# Patient Record
Sex: Female | Born: 1945
Health system: Southern US, Community
[De-identification: ages and names within clinical notes are randomized; demographics above are authoritative.]

## PROBLEM LIST (undated history)

## (undated) DIAGNOSIS — I5032 Chronic diastolic (congestive) heart failure: Secondary | ICD-10-CM

## (undated) DIAGNOSIS — I480 Paroxysmal atrial fibrillation: Secondary | ICD-10-CM

## (undated) DIAGNOSIS — E739 Lactose intolerance, unspecified: Secondary | ICD-10-CM

## (undated) DIAGNOSIS — R51 Headache: Secondary | ICD-10-CM

## (undated) DIAGNOSIS — N289 Disorder of kidney and ureter, unspecified: Secondary | ICD-10-CM

## (undated) DIAGNOSIS — M1990 Unspecified osteoarthritis, unspecified site: Secondary | ICD-10-CM

## (undated) DIAGNOSIS — I10 Essential (primary) hypertension: Secondary | ICD-10-CM

## (undated) DIAGNOSIS — L719 Rosacea, unspecified: Secondary | ICD-10-CM

## (undated) DIAGNOSIS — I2510 Atherosclerotic heart disease of native coronary artery without angina pectoris: Secondary | ICD-10-CM

## (undated) DIAGNOSIS — Z9289 Personal history of other medical treatment: Secondary | ICD-10-CM

## (undated) DIAGNOSIS — K219 Gastro-esophageal reflux disease without esophagitis: Secondary | ICD-10-CM

## (undated) DIAGNOSIS — E785 Hyperlipidemia, unspecified: Secondary | ICD-10-CM

## (undated) HISTORY — PX: CHOLECYSTECTOMY: SHX55

## (undated) HISTORY — DX: Atherosclerotic heart disease of native coronary artery without angina pectoris: I25.10

## (undated) HISTORY — DX: Paroxysmal atrial fibrillation: I48.0

## (undated) HISTORY — PX: ABDOMINAL HYSTERECTOMY: SHX81

## (undated) HISTORY — PX: TOTAL KNEE ARTHROPLASTY: SHX125

## (undated) HISTORY — DX: Lactose intolerance, unspecified: E73.9

## (undated) HISTORY — DX: Essential (primary) hypertension: I10

## (undated) HISTORY — DX: Chronic diastolic (congestive) heart failure: I50.32

## (undated) HISTORY — DX: Morbid (severe) obesity due to excess calories: E66.01

## (undated) HISTORY — PX: TUBAL LIGATION: SHX77

## (undated) HISTORY — DX: Gastro-esophageal reflux disease without esophagitis: K21.9

## (undated) HISTORY — PX: KNEE ARTHROSCOPY: SUR90

## (undated) HISTORY — DX: Hyperlipidemia, unspecified: E78.5

## (undated) HISTORY — PX: CORONARY STENT PLACEMENT: SHX1402

## (undated) HISTORY — DX: Headache: R51

## (undated) HISTORY — DX: Rosacea, unspecified: L71.9

---

## 1998-06-29 ENCOUNTER — Encounter: Payer: Self-pay | Admitting: Obstetrics and Gynecology

## 1998-06-29 ENCOUNTER — Ambulatory Visit (HOSPITAL_COMMUNITY): Admission: RE | Admit: 1998-06-29 | Discharge: 1998-06-29 | Payer: Self-pay | Admitting: *Deleted

## 1999-11-21 ENCOUNTER — Encounter: Payer: Self-pay | Admitting: Obstetrics and Gynecology

## 1999-11-21 ENCOUNTER — Ambulatory Visit (HOSPITAL_COMMUNITY): Admission: RE | Admit: 1999-11-21 | Discharge: 1999-11-21 | Payer: Self-pay | Admitting: Obstetrics and Gynecology

## 1999-11-27 ENCOUNTER — Encounter: Admission: RE | Admit: 1999-11-27 | Discharge: 1999-11-27 | Payer: Self-pay | Admitting: Obstetrics and Gynecology

## 1999-11-27 ENCOUNTER — Encounter: Payer: Self-pay | Admitting: Obstetrics and Gynecology

## 2001-02-05 HISTORY — PX: ESOPHAGOGASTRODUODENOSCOPY: SHX1529

## 2001-05-07 ENCOUNTER — Ambulatory Visit (HOSPITAL_COMMUNITY): Admission: RE | Admit: 2001-05-07 | Discharge: 2001-05-07 | Payer: Self-pay | Admitting: Obstetrics and Gynecology

## 2001-05-07 ENCOUNTER — Encounter: Payer: Self-pay | Admitting: Obstetrics and Gynecology

## 2001-06-25 ENCOUNTER — Ambulatory Visit (HOSPITAL_COMMUNITY): Admission: RE | Admit: 2001-06-25 | Discharge: 2001-06-25 | Payer: Self-pay | Admitting: Gastroenterology

## 2001-07-28 ENCOUNTER — Encounter: Payer: Self-pay | Admitting: Surgery

## 2001-07-31 ENCOUNTER — Encounter: Payer: Self-pay | Admitting: Surgery

## 2001-07-31 ENCOUNTER — Encounter (INDEPENDENT_AMBULATORY_CARE_PROVIDER_SITE_OTHER): Payer: Self-pay | Admitting: Specialist

## 2001-07-31 ENCOUNTER — Observation Stay (HOSPITAL_COMMUNITY): Admission: RE | Admit: 2001-07-31 | Discharge: 2001-08-01 | Payer: Self-pay | Admitting: Surgery

## 2002-01-19 ENCOUNTER — Emergency Department (HOSPITAL_COMMUNITY): Admission: EM | Admit: 2002-01-19 | Discharge: 2002-01-19 | Payer: Self-pay | Admitting: Emergency Medicine

## 2002-06-30 ENCOUNTER — Ambulatory Visit (HOSPITAL_COMMUNITY): Admission: RE | Admit: 2002-06-30 | Discharge: 2002-06-30 | Payer: Self-pay | Admitting: Orthopedic Surgery

## 2002-09-07 ENCOUNTER — Ambulatory Visit (HOSPITAL_COMMUNITY): Admission: RE | Admit: 2002-09-07 | Discharge: 2002-09-07 | Payer: Self-pay | Admitting: Obstetrics and Gynecology

## 2002-09-07 ENCOUNTER — Encounter: Payer: Self-pay | Admitting: Obstetrics and Gynecology

## 2003-09-09 ENCOUNTER — Ambulatory Visit (HOSPITAL_COMMUNITY): Admission: RE | Admit: 2003-09-09 | Discharge: 2003-09-09 | Payer: Self-pay | Admitting: Family Medicine

## 2003-09-27 ENCOUNTER — Other Ambulatory Visit: Admission: RE | Admit: 2003-09-27 | Discharge: 2003-09-27 | Payer: Self-pay | Admitting: Internal Medicine

## 2004-07-14 ENCOUNTER — Ambulatory Visit: Payer: Self-pay | Admitting: Internal Medicine

## 2004-08-01 ENCOUNTER — Ambulatory Visit: Payer: Self-pay | Admitting: Family Medicine

## 2004-08-15 ENCOUNTER — Ambulatory Visit: Payer: Self-pay | Admitting: Family Medicine

## 2004-09-12 ENCOUNTER — Ambulatory Visit: Payer: Self-pay | Admitting: Cardiology

## 2004-09-19 ENCOUNTER — Ambulatory Visit: Payer: Self-pay | Admitting: Cardiology

## 2004-09-21 ENCOUNTER — Ambulatory Visit: Payer: Self-pay | Admitting: Cardiology

## 2004-09-25 ENCOUNTER — Ambulatory Visit: Payer: Self-pay | Admitting: Cardiology

## 2004-09-25 ENCOUNTER — Inpatient Hospital Stay (HOSPITAL_BASED_OUTPATIENT_CLINIC_OR_DEPARTMENT_OTHER): Admission: RE | Admit: 2004-09-25 | Discharge: 2004-09-25 | Payer: Self-pay | Admitting: Cardiology

## 2004-10-06 ENCOUNTER — Ambulatory Visit: Payer: Self-pay

## 2004-10-10 ENCOUNTER — Ambulatory Visit: Payer: Self-pay | Admitting: Cardiology

## 2004-11-29 ENCOUNTER — Ambulatory Visit: Payer: Self-pay | Admitting: Family Medicine

## 2005-03-30 ENCOUNTER — Ambulatory Visit: Payer: Self-pay | Admitting: Family Medicine

## 2005-04-19 ENCOUNTER — Inpatient Hospital Stay (HOSPITAL_COMMUNITY): Admission: RE | Admit: 2005-04-19 | Discharge: 2005-04-22 | Payer: Self-pay | Admitting: Orthopedic Surgery

## 2005-05-08 ENCOUNTER — Encounter: Admission: RE | Admit: 2005-05-08 | Discharge: 2005-08-06 | Payer: Self-pay | Admitting: Orthopedic Surgery

## 2005-10-19 ENCOUNTER — Encounter: Payer: Self-pay | Admitting: Family Medicine

## 2005-10-19 ENCOUNTER — Ambulatory Visit: Payer: Self-pay | Admitting: Family Medicine

## 2005-10-19 ENCOUNTER — Other Ambulatory Visit: Admission: RE | Admit: 2005-10-19 | Discharge: 2005-10-19 | Payer: Self-pay | Admitting: Family Medicine

## 2005-10-19 LAB — CONVERTED CEMR LAB: Pap Smear: NORMAL

## 2005-10-31 ENCOUNTER — Ambulatory Visit (HOSPITAL_COMMUNITY): Admission: RE | Admit: 2005-10-31 | Discharge: 2005-10-31 | Payer: Self-pay | Admitting: Family Medicine

## 2005-11-15 ENCOUNTER — Ambulatory Visit (HOSPITAL_BASED_OUTPATIENT_CLINIC_OR_DEPARTMENT_OTHER): Admission: RE | Admit: 2005-11-15 | Discharge: 2005-11-15 | Payer: Self-pay | Admitting: Family Medicine

## 2005-11-19 ENCOUNTER — Ambulatory Visit: Payer: Self-pay | Admitting: Family Medicine

## 2005-11-28 ENCOUNTER — Ambulatory Visit: Payer: Self-pay | Admitting: Pulmonary Disease

## 2005-11-30 LAB — FECAL OCCULT BLOOD, GUAIAC: Fecal Occult Blood: NEGATIVE

## 2005-12-06 ENCOUNTER — Ambulatory Visit: Payer: Self-pay | Admitting: Family Medicine

## 2005-12-21 ENCOUNTER — Ambulatory Visit: Payer: Self-pay | Admitting: Pulmonary Disease

## 2006-05-31 ENCOUNTER — Ambulatory Visit: Payer: Self-pay | Admitting: Family Medicine

## 2006-07-04 ENCOUNTER — Inpatient Hospital Stay (HOSPITAL_COMMUNITY): Admission: RE | Admit: 2006-07-04 | Discharge: 2006-07-09 | Payer: Self-pay | Admitting: Orthopedic Surgery

## 2006-07-04 ENCOUNTER — Ambulatory Visit: Payer: Self-pay | Admitting: Cardiology

## 2006-07-04 ENCOUNTER — Ambulatory Visit: Payer: Self-pay | Admitting: Internal Medicine

## 2006-07-10 ENCOUNTER — Telehealth: Payer: Self-pay | Admitting: Family Medicine

## 2006-07-10 DIAGNOSIS — I4819 Other persistent atrial fibrillation: Secondary | ICD-10-CM

## 2006-07-17 ENCOUNTER — Telehealth (INDEPENDENT_AMBULATORY_CARE_PROVIDER_SITE_OTHER): Payer: Self-pay | Admitting: *Deleted

## 2006-07-30 ENCOUNTER — Encounter: Admission: RE | Admit: 2006-07-30 | Discharge: 2006-08-16 | Payer: Self-pay | Admitting: Orthopedic Surgery

## 2006-07-31 ENCOUNTER — Ambulatory Visit: Payer: Self-pay | Admitting: Cardiology

## 2006-08-01 ENCOUNTER — Ambulatory Visit: Payer: Self-pay | Admitting: Family Medicine

## 2006-08-01 DIAGNOSIS — E7800 Pure hypercholesterolemia, unspecified: Secondary | ICD-10-CM | POA: Insufficient documentation

## 2006-08-05 LAB — CONVERTED CEMR LAB
Cholesterol: 108 mg/dL (ref 0–200)
HDL: 40.7 mg/dL (ref >39.0)
Total CHOL/HDL Ratio: 2.7
Triglycerides: 118 mg/dL (ref 0–149)
VLDL: 24 mg/dL (ref 0–40)

## 2006-08-06 ENCOUNTER — Telehealth (INDEPENDENT_AMBULATORY_CARE_PROVIDER_SITE_OTHER): Payer: Self-pay | Admitting: *Deleted

## 2006-08-06 ENCOUNTER — Encounter: Payer: Self-pay | Admitting: Family Medicine

## 2006-08-06 DIAGNOSIS — G4733 Obstructive sleep apnea (adult) (pediatric): Secondary | ICD-10-CM | POA: Insufficient documentation

## 2006-08-06 DIAGNOSIS — R51 Headache: Secondary | ICD-10-CM

## 2006-08-06 DIAGNOSIS — R519 Headache, unspecified: Secondary | ICD-10-CM | POA: Insufficient documentation

## 2006-08-06 DIAGNOSIS — L719 Rosacea, unspecified: Secondary | ICD-10-CM

## 2006-08-06 DIAGNOSIS — E739 Lactose intolerance, unspecified: Secondary | ICD-10-CM

## 2006-08-06 DIAGNOSIS — R609 Edema, unspecified: Secondary | ICD-10-CM

## 2006-08-06 DIAGNOSIS — I10 Essential (primary) hypertension: Secondary | ICD-10-CM | POA: Insufficient documentation

## 2006-08-06 DIAGNOSIS — E785 Hyperlipidemia, unspecified: Secondary | ICD-10-CM

## 2006-08-06 DIAGNOSIS — N318 Other neuromuscular dysfunction of bladder: Secondary | ICD-10-CM

## 2006-08-06 DIAGNOSIS — N3946 Mixed incontinence: Secondary | ICD-10-CM

## 2006-08-06 DIAGNOSIS — M1990 Unspecified osteoarthritis, unspecified site: Secondary | ICD-10-CM | POA: Insufficient documentation

## 2006-08-06 DIAGNOSIS — H9319 Tinnitus, unspecified ear: Secondary | ICD-10-CM | POA: Insufficient documentation

## 2006-08-16 ENCOUNTER — Ambulatory Visit: Payer: Self-pay | Admitting: Family Medicine

## 2006-08-16 DIAGNOSIS — N390 Urinary tract infection, site not specified: Secondary | ICD-10-CM

## 2006-08-16 DIAGNOSIS — R197 Diarrhea, unspecified: Secondary | ICD-10-CM

## 2006-08-16 DIAGNOSIS — R110 Nausea: Secondary | ICD-10-CM

## 2006-08-16 LAB — CONVERTED CEMR LAB
Ketones, urine, test strip: NEGATIVE
Nitrite: NEGATIVE
Protein, U semiquant: 30
Specific Gravity, Urine: <1.005
Urine crystals, microscopic: 0 /hpf
Urobilinogen, UA: 0.2
Yeast, UA: 0
pH: 8

## 2006-08-17 ENCOUNTER — Encounter: Payer: Self-pay | Admitting: Family Medicine

## 2006-08-20 ENCOUNTER — Telehealth: Payer: Self-pay | Admitting: Family Medicine

## 2006-08-23 ENCOUNTER — Encounter: Payer: Self-pay | Admitting: Family Medicine

## 2006-08-26 ENCOUNTER — Ambulatory Visit: Payer: Self-pay | Admitting: Family Medicine

## 2006-08-26 DIAGNOSIS — R109 Unspecified abdominal pain: Secondary | ICD-10-CM | POA: Insufficient documentation

## 2006-08-26 DIAGNOSIS — N3000 Acute cystitis without hematuria: Secondary | ICD-10-CM

## 2006-08-27 LAB — CONVERTED CEMR LAB
AST: 16 units/L (ref 0–37)
Albumin: 3.8 g/dL (ref 3.5–5.2)
Basophils Relative: 0.8 % (ref 0.0–1.0)
Eosinophils Absolute: 0.3 10*3/uL (ref 0.0–0.6)
Eosinophils Relative: 3.2 % (ref 0.0–5.0)
H Pylori IgG: NEGATIVE
Hemoglobin: 13 g/dL (ref 12.0–15.0)
Monocytes Relative: 4.9 % (ref 3.0–11.0)
Neutro Abs: 5.4 10*3/uL (ref 1.4–7.7)
Neutrophils Relative %: 68.5 % (ref 43.0–77.0)
Platelets: 253 10*3/uL (ref 150–400)
RDW: 14 % (ref 11.5–14.6)
Total Bilirubin: 0.6 mg/dL (ref 0.3–1.2)

## 2006-08-30 ENCOUNTER — Ambulatory Visit: Payer: Self-pay | Admitting: Family Medicine

## 2006-08-30 DIAGNOSIS — K219 Gastro-esophageal reflux disease without esophagitis: Secondary | ICD-10-CM

## 2006-12-25 ENCOUNTER — Ambulatory Visit: Payer: Self-pay | Admitting: Family Medicine

## 2007-01-22 ENCOUNTER — Ambulatory Visit (HOSPITAL_COMMUNITY): Admission: RE | Admit: 2007-01-22 | Discharge: 2007-01-22 | Payer: Self-pay | Admitting: Family Medicine

## 2007-01-28 ENCOUNTER — Encounter (INDEPENDENT_AMBULATORY_CARE_PROVIDER_SITE_OTHER): Payer: Self-pay | Admitting: *Deleted

## 2007-01-28 LAB — HM MAMMOGRAPHY: HM Mammogram: NORMAL

## 2007-06-05 ENCOUNTER — Ambulatory Visit: Payer: Self-pay | Admitting: Cardiology

## 2007-06-05 LAB — CONVERTED CEMR LAB
AST: 18 units/L (ref 0–37)
Albumin: 3.6 g/dL (ref 3.5–5.2)
Basophils Absolute: 0.1 10*3/uL (ref 0.0–0.1)
Basophils Relative: 0.6 % (ref 0.0–1.0)
Chloride: 103 meq/L (ref 96–112)
Eosinophils Absolute: 0.3 10*3/uL (ref 0.0–0.7)
Eosinophils Relative: 3.2 % (ref 0.0–5.0)
Hemoglobin: 13.6 g/dL (ref 12.0–15.0)
MCHC: 33.6 g/dL (ref 30.0–36.0)
MCV: 80.9 fL (ref 78.0–100.0)
Magnesium: 1.9 mg/dL (ref 1.5–2.5)
Monocytes Absolute: 0.4 10*3/uL (ref 0.1–1.0)
Neutro Abs: 5.9 10*3/uL (ref 1.4–7.7)
Neutrophils Relative %: 68.2 % (ref 43.0–77.0)
Platelets: 253 10*3/uL (ref 150–400)
Potassium: 3.4 meq/L — ABNORMAL LOW (ref 3.5–5.1)
RBC: 5 M/uL (ref 3.87–5.11)
RDW: 13.3 % (ref 11.5–14.6)
Sodium: 139 meq/L (ref 135–145)
TSH: 2.44 microintl units/mL (ref 0.35–5.50)
Total Bilirubin: 0.7 mg/dL (ref 0.3–1.2)
Total Protein: 6.5 g/dL (ref 6.0–8.3)

## 2007-06-11 ENCOUNTER — Ambulatory Visit: Payer: Self-pay | Admitting: Cardiology

## 2007-06-16 ENCOUNTER — Ambulatory Visit: Payer: Self-pay | Admitting: Cardiology

## 2007-06-19 ENCOUNTER — Ambulatory Visit: Payer: Self-pay | Admitting: Cardiology

## 2007-06-19 LAB — CONVERTED CEMR LAB
CO2: 25 meq/L (ref 19–32)
Chloride: 108 meq/L (ref 96–112)
GFR calc Af Amer: 72 mL/min
GFR calc non Af Amer: 60 mL/min
Glucose, Bld: 93 mg/dL (ref 70–99)
Potassium: 4 meq/L (ref 3.5–5.1)

## 2007-06-25 ENCOUNTER — Ambulatory Visit: Payer: Self-pay | Admitting: Cardiology

## 2007-07-04 ENCOUNTER — Ambulatory Visit: Payer: Self-pay | Admitting: Cardiology

## 2007-07-24 ENCOUNTER — Ambulatory Visit: Payer: Self-pay | Admitting: Cardiology

## 2007-08-21 ENCOUNTER — Ambulatory Visit: Payer: Self-pay | Admitting: Cardiology

## 2007-08-28 ENCOUNTER — Ambulatory Visit: Payer: Self-pay | Admitting: Cardiology

## 2007-09-04 ENCOUNTER — Ambulatory Visit: Payer: Self-pay | Admitting: Internal Medicine

## 2007-09-12 ENCOUNTER — Ambulatory Visit: Payer: Self-pay | Admitting: Cardiology

## 2007-09-12 LAB — CONVERTED CEMR LAB
Basophils Absolute: 0.1 10*3/uL (ref 0.0–0.1)
Basophils Relative: 0.7 % (ref 0.0–3.0)
Creatinine, Ser: 0.9 mg/dL (ref 0.4–1.2)
Eosinophils Relative: 2.8 % (ref 0.0–5.0)
GFR calc non Af Amer: 67 mL/min
Lymphocytes Relative: 24.2 % (ref 12.0–46.0)
MCHC: 34.1 g/dL (ref 30.0–36.0)
MCV: 79.7 fL (ref 78.0–100.0)
Monocytes Absolute: 0.5 10*3/uL (ref 0.1–1.0)
Monocytes Relative: 6.2 % (ref 3.0–12.0)
Platelets: 249 10*3/uL (ref 150–400)
RBC: 5.09 M/uL (ref 3.87–5.11)

## 2007-09-16 ENCOUNTER — Ambulatory Visit (HOSPITAL_COMMUNITY): Admission: RE | Admit: 2007-09-16 | Discharge: 2007-09-16 | Payer: Self-pay | Admitting: Cardiology

## 2007-09-16 ENCOUNTER — Encounter: Payer: Self-pay | Admitting: Family Medicine

## 2007-09-16 ENCOUNTER — Ambulatory Visit: Payer: Self-pay | Admitting: Cardiology

## 2007-09-24 ENCOUNTER — Ambulatory Visit: Payer: Self-pay | Admitting: Cardiology

## 2007-10-07 ENCOUNTER — Ambulatory Visit: Payer: Self-pay | Admitting: Cardiology

## 2007-10-20 ENCOUNTER — Ambulatory Visit: Payer: Self-pay | Admitting: Cardiology

## 2007-11-04 ENCOUNTER — Ambulatory Visit: Payer: Self-pay

## 2007-11-04 ENCOUNTER — Encounter: Payer: Self-pay | Admitting: Family Medicine

## 2007-11-04 ENCOUNTER — Ambulatory Visit: Payer: Self-pay | Admitting: Internal Medicine

## 2007-12-02 ENCOUNTER — Ambulatory Visit: Payer: Self-pay | Admitting: Cardiology

## 2007-12-04 ENCOUNTER — Ambulatory Visit: Payer: Self-pay | Admitting: Family Medicine

## 2008-01-06 ENCOUNTER — Ambulatory Visit: Payer: Self-pay | Admitting: Cardiology

## 2008-01-09 ENCOUNTER — Ambulatory Visit: Payer: Self-pay | Admitting: Cardiology

## 2008-02-03 ENCOUNTER — Ambulatory Visit: Payer: Self-pay | Admitting: Cardiology

## 2008-02-03 ENCOUNTER — Emergency Department (HOSPITAL_COMMUNITY): Admission: EM | Admit: 2008-02-03 | Discharge: 2008-02-03 | Payer: Self-pay | Admitting: Family Medicine

## 2008-02-03 ENCOUNTER — Telehealth (INDEPENDENT_AMBULATORY_CARE_PROVIDER_SITE_OTHER): Payer: Self-pay | Admitting: *Deleted

## 2008-02-06 HISTORY — PX: CORONARY ANGIOPLASTY WITH STENT PLACEMENT: SHX49

## 2008-03-02 ENCOUNTER — Ambulatory Visit: Payer: Self-pay | Admitting: Cardiology

## 2008-03-30 ENCOUNTER — Ambulatory Visit: Payer: Self-pay | Admitting: Cardiovascular Disease

## 2008-05-04 ENCOUNTER — Ambulatory Visit: Payer: Self-pay | Admitting: Cardiology

## 2008-06-03 ENCOUNTER — Ambulatory Visit: Payer: Self-pay | Admitting: Internal Medicine

## 2008-06-23 ENCOUNTER — Ambulatory Visit: Payer: Self-pay | Admitting: Family Medicine

## 2008-06-23 DIAGNOSIS — B373 Candidiasis of vulva and vagina: Secondary | ICD-10-CM

## 2008-06-23 LAB — CONVERTED CEMR LAB
Blood in Urine, dipstick: NEGATIVE
Glucose, Urine, Semiquant: NEGATIVE
Ketones, urine, test strip: NEGATIVE
Urobilinogen, UA: NEGATIVE
Whiff Test: NEGATIVE
pH: 6

## 2008-06-24 ENCOUNTER — Ambulatory Visit: Payer: Self-pay | Admitting: Cardiology

## 2008-06-25 ENCOUNTER — Telehealth: Payer: Self-pay | Admitting: Cardiology

## 2008-06-28 ENCOUNTER — Telehealth: Payer: Self-pay | Admitting: Family Medicine

## 2008-07-01 ENCOUNTER — Ambulatory Visit: Payer: Self-pay | Admitting: Family Medicine

## 2008-07-01 DIAGNOSIS — L259 Unspecified contact dermatitis, unspecified cause: Secondary | ICD-10-CM | POA: Insufficient documentation

## 2008-07-01 DIAGNOSIS — N898 Other specified noninflammatory disorders of vagina: Secondary | ICD-10-CM | POA: Insufficient documentation

## 2008-07-01 DIAGNOSIS — R4589 Other symptoms and signs involving emotional state: Secondary | ICD-10-CM

## 2008-07-01 DIAGNOSIS — L293 Anogenital pruritus, unspecified: Secondary | ICD-10-CM

## 2008-07-06 ENCOUNTER — Encounter: Payer: Self-pay | Admitting: *Deleted

## 2008-07-08 ENCOUNTER — Ambulatory Visit: Payer: Self-pay | Admitting: Cardiology

## 2008-07-08 ENCOUNTER — Ambulatory Visit: Payer: Self-pay | Admitting: Internal Medicine

## 2008-07-08 LAB — CONVERTED CEMR LAB
POC INR: 2.8
Protime: 20.4

## 2008-07-09 ENCOUNTER — Ambulatory Visit: Payer: Self-pay | Admitting: Cardiology

## 2008-07-09 ENCOUNTER — Inpatient Hospital Stay (HOSPITAL_COMMUNITY): Admission: AD | Admit: 2008-07-09 | Discharge: 2008-07-12 | Payer: Self-pay | Admitting: Cardiology

## 2008-07-20 ENCOUNTER — Encounter: Payer: Self-pay | Admitting: Cardiology

## 2008-07-20 ENCOUNTER — Ambulatory Visit: Payer: Self-pay | Admitting: Internal Medicine

## 2008-07-20 ENCOUNTER — Ambulatory Visit: Payer: Self-pay

## 2008-07-20 LAB — CONVERTED CEMR LAB: POC INR: 2.5

## 2008-07-22 ENCOUNTER — Ambulatory Visit: Payer: Self-pay | Admitting: *Deleted

## 2008-07-23 ENCOUNTER — Inpatient Hospital Stay (HOSPITAL_COMMUNITY): Admission: EM | Admit: 2008-07-23 | Discharge: 2008-07-27 | Payer: Self-pay | Admitting: Emergency Medicine

## 2008-07-26 ENCOUNTER — Encounter (INDEPENDENT_AMBULATORY_CARE_PROVIDER_SITE_OTHER): Payer: Self-pay | Admitting: Emergency Medicine

## 2008-07-26 ENCOUNTER — Encounter: Payer: Self-pay | Admitting: Cardiology

## 2008-07-30 ENCOUNTER — Ambulatory Visit: Payer: Self-pay | Admitting: Cardiology

## 2008-07-30 LAB — CONVERTED CEMR LAB: POC INR: 2.4

## 2008-08-03 ENCOUNTER — Ambulatory Visit: Payer: Self-pay | Admitting: Cardiology

## 2008-08-03 LAB — CONVERTED CEMR LAB: Prothrombin Time: 18.2 s

## 2008-08-11 ENCOUNTER — Encounter: Payer: Self-pay | Admitting: *Deleted

## 2008-08-24 ENCOUNTER — Ambulatory Visit: Payer: Self-pay | Admitting: Cardiology

## 2008-08-24 DIAGNOSIS — Z9861 Coronary angioplasty status: Secondary | ICD-10-CM

## 2008-08-26 ENCOUNTER — Encounter (HOSPITAL_COMMUNITY): Admission: RE | Admit: 2008-08-26 | Discharge: 2008-11-24 | Payer: Self-pay | Admitting: Cardiology

## 2008-09-07 ENCOUNTER — Ambulatory Visit: Payer: Self-pay | Admitting: Cardiology

## 2008-09-07 LAB — CONVERTED CEMR LAB
POC INR: 2.2
Prothrombin Time: 18.2 s

## 2008-09-16 ENCOUNTER — Encounter: Payer: Self-pay | Admitting: Cardiology

## 2008-09-21 ENCOUNTER — Telehealth: Payer: Self-pay | Admitting: Cardiology

## 2008-09-28 ENCOUNTER — Ambulatory Visit: Payer: Self-pay | Admitting: Cardiovascular Disease

## 2008-10-12 ENCOUNTER — Telehealth: Payer: Self-pay | Admitting: Cardiology

## 2008-10-15 ENCOUNTER — Ambulatory Visit: Payer: Self-pay | Admitting: Internal Medicine

## 2008-10-15 LAB — CONVERTED CEMR LAB: POC INR: 3.1

## 2008-10-29 ENCOUNTER — Ambulatory Visit: Payer: Self-pay | Admitting: Internal Medicine

## 2008-11-19 ENCOUNTER — Ambulatory Visit: Payer: Self-pay | Admitting: Cardiology

## 2008-11-19 LAB — CONVERTED CEMR LAB: POC INR: 2.4

## 2008-11-25 ENCOUNTER — Encounter (HOSPITAL_COMMUNITY): Admission: RE | Admit: 2008-11-25 | Discharge: 2009-02-02 | Payer: Self-pay | Admitting: Cardiology

## 2008-12-01 ENCOUNTER — Ambulatory Visit: Payer: Self-pay | Admitting: Cardiology

## 2008-12-01 ENCOUNTER — Encounter: Payer: Self-pay | Admitting: Cardiology

## 2008-12-01 DIAGNOSIS — I2510 Atherosclerotic heart disease of native coronary artery without angina pectoris: Secondary | ICD-10-CM

## 2008-12-08 ENCOUNTER — Encounter: Payer: Self-pay | Admitting: Family Medicine

## 2008-12-17 ENCOUNTER — Encounter: Payer: Self-pay | Admitting: Cardiology

## 2009-06-02 ENCOUNTER — Ambulatory Visit: Payer: Self-pay | Admitting: Cardiology

## 2009-06-22 ENCOUNTER — Telehealth (INDEPENDENT_AMBULATORY_CARE_PROVIDER_SITE_OTHER): Payer: Self-pay | Admitting: *Deleted

## 2009-06-27 ENCOUNTER — Ambulatory Visit: Payer: Self-pay | Admitting: Cardiology

## 2009-06-27 ENCOUNTER — Encounter: Payer: Self-pay | Admitting: Cardiology

## 2009-07-12 ENCOUNTER — Ambulatory Visit: Payer: Self-pay | Admitting: Family Medicine

## 2010-02-28 ENCOUNTER — Encounter: Payer: Self-pay | Admitting: Cardiology

## 2010-02-28 ENCOUNTER — Ambulatory Visit
Admission: RE | Admit: 2010-02-28 | Discharge: 2010-02-28 | Payer: Self-pay | Source: Home / Self Care | Attending: Cardiology | Admitting: Cardiology

## 2010-03-05 LAB — CONVERTED CEMR LAB
GFR calc non Af Amer: 47.78 mL/min (ref >60)
Potassium: 4.2 meq/L (ref 3.5–5.1)

## 2010-03-07 NOTE — Assessment & Plan Note (Signed)
Summary: ekg  Nurse Visit   Vital Signs:  Patient profile:   65 year old female Pulse rate:   68 / minute Pulse rhythm:   regular BP sitting:   130 / 80  (right arm) Cuff size:   large  Vitals Entered By: Sherri Rad, RN, BSN (Jun 27, 2009 10:26 AM)  Visit Type:  Nurse visit- EKG Primary Provider:  Judith Part MD   History of Present Illness: The pt is without complaints today. EKG was reviewed by Dr. Jens Som.    Allergies: 1)  ! Crestor

## 2010-03-07 NOTE — Assessment & Plan Note (Signed)
Summary: TRANSFER FROM [facility] BY DR Dayton Martes.   Vital Signs:  Patient profile:   65 year old female Height:      65 inches Weight:      263.50 pounds BMI:     44.01 Temp:     98.3 degrees F oral Pulse rate:   60 / minute Pulse rhythm:   regular BP sitting:   140 / 82  (left arm) Cuff size:   large  Vitals Entered By: Linde Gillis CMA Duncan Dull) (July 12, 2009 11:33 AM) CC: transfer from Dr. Milinda Antis   History of Present Illness: Pt new to me here to establish care.   Urinary incontinence- mixed, has incontinence with sneezing, coughing laughing but often cannot make it to the bathroom.  Has not had a formal urological work up.  These symptoms have been ongoing for years.    A fib - on tikosyn , cartia XT 120 mg daily, just saw cardiology last month. Reviewed notes.        Current Medications (verified): 1)  Metoprolol Tartrate 50 Mg Tabs (Metoprolol Tartrate) .[name].. 1 and 1/2 By Mouth Two Times A Day 2)  Furosemide 20 Mg Tabs (Furosemide) .... Take 1 Tablet By Mouth Once A Day As Needed 3)  Klor-Con M20 20 Meq Cr-Tabs (Potassium Chloride Crys Cr) .... Daily 4)  Cartia Xt 120 Mg Xr24h-Cap (Diltiazem Hcl Coated Beads) .... Take 1 Capsule Daily 5)  Tikosyn 250 Mcg Caps (Dofetilide) .... Take One Capsule By Mouth Twice A Day 6)  Mag-Ox 400 400 Mg Tabs (Magnesium Oxide) .... Take 1 Tablet By Mouth Two Times A Day 7)  Plavix 75 Mg Tabs (Clopidogrel Bisulfate) .... Take One Tablet By Mouth Daily 8)  Aspirin 81 Mg Tbec (Aspirin) .... Take One Tablet By Mouth Daily 9)  Detrol La 2 Mg  Cp24 (Tolterodine Tartrate) .[name].. 1 Tablet By Mouth Daily  Allergies: 1)  ! Crestor  Past History:  Past Medical History: Last updated: 08/24/2008 Headache Hyperlipidemia Hypertension Osteoarthritis Atrial fibrillation (s/p DCCV and Tikosyn) CAD (July 26, 2008, per Dr. Charlies Constable revealing LAD 70-90% proximal, 50% mid.  Circumflex was okay.  RCA was okay.  The stent to the LAD had 90% which had a  Liberte 2.75 x 20 mm drug-eluting stent placed reducing it from 90% to 0%.)    Past Surgical History: Last updated: 08/24/2008 Cholecystectomy Hysterectomy- partial, bleeding (1980) Left knee arthroscopy (2000) Right knee arthroscopy (06/1999) Colonoscopy- diverticulosis 92003) EGD- duodenitis (2003) Cataracts Appendectomy Dexa- normal (2002) Tubal ligation Total knee replacement- bilateral  Family History: Last updated: 06/24/2008  Noncontributory.   Social History: Last updated: 06/24/2008 Marital Status: Married,Children: 2 The patient does not smoke cigarettes.  She does not drink alcohol.  Family History: Reviewed history from 06/24/2008 and no changes required.  Noncontributory.   Social History: Reviewed history from 06/24/2008 and no changes required. Marital Status: Married,Children: 2 The patient does not smoke cigarettes.  She does not drink alcohol.  Review of Systems      See HPI General:  Denies malaise. CV:  Denies chest pain or discomfort, difficulty breathing at night, difficulty breathing while lying down, and fatigue. GU:  Complains of incontinence; denies discharge, dysuria, urinary frequency, and urinary hesitancy.  Physical Exam  General:  overweight but generally well appearing  Mouth:  good dentition and no gingival abnormalities.   Lungs:  Normal respiratory effort, chest expands symmetrically. Lungs are clear to auscultation, no crackles or wheezes. Heart:  Normal rate and  regular rhythm. S1 and S2 normal without gallop, murmur, click, rub or other extra sounds. Abdomen:  Bowel sounds positive,abdomen soft and non-tender without masses, organomegaly or hernias noted. No suprapubic tenderness Psych:  Cognition and judgment appear intact. Alert and cooperative with normal attention span and concentration. No apparent delusions, illusions, hallucinations   Impression & Recommendations:  Problem # 1:  Hx of URINARY INCONTINENCE, MIXED  (ICD-788.33) Assessment Deteriorated  Time spent with patient 25 minutes, more than 50% of this time was spent counseling patient on urinary incontinence.  I do think she would benefit from urodynamic studies. Will start with trial of Detrol although since incontinence is primary stress incontinence, may not be very beneficial.  Discussed lifestyle changes as well.  Orders: Prescription Created Electronically [phone])  Problem # 2:  FIBRILLATION, ATRIAL (ICD-427.31) Assessment: Unchanged Followed by cards. Her updated medication list for this problem includes:    Metoprolol Tartrate 50 Mg Tabs (Metoprolol tartrate) .[name]... 1 and 1/2 by mouth two times a day    Cartia Xt 120 Mg Xr24h-cap (Diltiazem hcl coated beads) .[name]... Take 1 capsule daily    Tikosyn 250 Mcg Caps (Dofetilide) .[name]... Take one capsule by mouth twice a day    Plavix 75 Mg Tabs (Clopidogrel bisulfate) .[name]... Take one tablet by mouth daily    Aspirin 81 Mg Tbec (Aspirin) .[name]... Take one tablet by mouth daily  Complete Medication List: 1)  Metoprolol Tartrate 50 Mg Tabs (Metoprolol tartrate) .[name].. 1 and 1/2 by mouth two times a day 2)  Furosemide 20 Mg Tabs (Furosemide) .... Take 1 tablet by mouth once a day as needed 3)  Klor-con M20 20 Meq Cr-tabs (Potassium chloride crys cr) .... Daily 4)  Cartia Xt 120 Mg Xr24h-cap (Diltiazem hcl coated beads) .... Take 1 capsule daily 5)  Tikosyn 250 Mcg Caps (Dofetilide) .... Take one capsule by mouth twice a day 6)  Mag-ox 400 400 Mg Tabs (Magnesium oxide) .... Take 1 tablet by mouth two times a day 7)  Plavix 75 Mg Tabs (Clopidogrel bisulfate) .... Take one tablet by mouth daily 8)  Aspirin 81 Mg Tbec (Aspirin) .... Take one tablet by mouth daily 9)  Detrol La 2 Mg Cp24 (Tolterodine tartrate) .[name].. 1 tablet by mouth daily  Patient Instructions: 1)  Great to meet you, Ms. Krinsky. 2)  Please come back to see me or call me in a month or so to let me know how the Detrol is  working. Prescriptions: DETROL LA 2 MG  CP24 (TOLTERODINE TARTRATE) 1 tablet by mouth daily  #90 x 3   Entered and Authorized by:   Ruthe Mannan MD   Signed by:   Ruthe Mannan MD on 07/12/2009   Method used:   Electronically to        [facility]  [address] (retail)       [address], [address]       [place], [place]  [address]       Ph: [phone]       Fax: [phone]   RxID:   [id number] DETROL LA 2 MG  CP24 (TOLTERODINE TARTRATE) 1 tablet by mouth daily  #90 x 3   Entered and Authorized by:   Ruthe Mannan MD   Signed by:   Ruthe Mannan MD on 07/12/2009   Method used:   Electronically to        [facility]  [address] [phone][facility] (retail)  [address]       [place], [place]  [address]       Ph: [phone] or [phone]       Fax: [phone]   RxID:   [id number]   Current Allergies (reviewed today): ! CRESTOR

## 2010-03-07 NOTE — Assessment & Plan Note (Signed)
Summary: 427.31  414.01  6 months rov   Visit Type:  Follow-up Primary Provider:  Judith Part MD  CC:  CAD/Atrial Fibrillation.  History of Present Illness: The patient presents for followup. Since I last saw her she has had no new complaints. She continues to gain weight from "eating too much." She doesn't feel any palpitations. She's had no presyncope or syncope. She's had no chest pressure, neck or arm discomfort. She does get dyspneic with moderate exertion but this is unchanged. She has occasional fleeting chest pains which have been chronic and in a stable pattern. She has had some increasing ankle edema and has taken some diuretic recently. She doesn't exercise because of joint pains.  Current Medications (verified): 1)  Metoprolol Tartrate 50 Mg Tabs (Metoprolol Tartrate) .[name].. 1 and 1/2 By Mouth Two Times A Day 2)  Furosemide 20 Mg Tabs (Furosemide) .... Take 1 Tablet By Mouth Once A Day As Needed 3)  Klor-Con M20 20 Meq Cr-Tabs (Potassium Chloride Crys Cr) .... Daily 4)  Cartia Xt 120 Mg Xr24h-Cap (Diltiazem Hcl Coated Beads) .... Take 1 Capsule Daily 5)  Tikosyn 250 Mcg Caps (Dofetilide) .... Take One Capsule By Mouth Twice A Day 6)  Mag-Ox 400 400 Mg Tabs (Magnesium Oxide) .... Take 1 Tablet By Mouth Two Times A Day 7)  Plavix 75 Mg Tabs (Clopidogrel Bisulfate) .... Take One Tablet By Mouth Daily 8)  Aspirin 81 Mg Tbec (Aspirin) .... Take One Tablet By Mouth Daily  Allergies (verified): 1)  ! Crestor  Past History:  Past Medical History: Reviewed history from 08/24/2008 and no changes required. Headache Hyperlipidemia Hypertension Osteoarthritis Atrial fibrillation (s/p DCCV and Tikosyn) CAD (July 26, 2008, per Dr. Charlies Constable revealing LAD 70-90% proximal, 50% mid.  Circumflex was okay.  RCA was okay.  The stent to the LAD had 90% which had a Liberte 2.75 x 20 mm drug-eluting stent placed reducing it from 90% to 0%.)    Past Surgical History: Reviewed history  from 08/24/2008 and no changes required. Cholecystectomy Hysterectomy- partial, bleeding (1980) Left knee arthroscopy (2000) Right knee arthroscopy (06/1999) Colonoscopy- diverticulosis 92003) EGD- duodenitis (2003) Cataracts Appendectomy Dexa- normal (2002) Tubal ligation Total knee replacement- bilateral  Review of Systems       As stated in the HPI and negative for all other systems.   Vital Signs:  Patient profile:   65 year old female Height:      65 inches Weight:      263 pounds BMI:     43.92 Pulse rate:   79 / minute Resp:     18 per minute BP sitting:   118 / 72  (right arm)  Vitals Entered By: Marrion Coy, CNA (June 02, 2009 2:11 PM)   Physical Exam  General:  Well developed, well nourished, in no acute distress. Head:  normocephalic and atraumatic Neck:  Neck supple, no JVD. No masses, thyromegaly or abnormal cervical nodes. Lungs:  Clear bilaterally to auscultation and percussion. Heart:  Non-displaced PMI, chest non-tender; regular rate and rhythm, S1, S2 without murmurs, rubs or gallops. Carotid upstroke normal, no bruit. Normal abdominal aortic size, no bruits. Femorals normal pulses, no bruits. Pedals normal pulses. No edema, no varicosities. Abdomen:  Bowel sounds positive; abdomen soft and non-tender without masses, organomegaly, or hernias noted. No hepatosplenomegaly., obese Msk:  Back normal, normal gait. Muscle strength and tone normal. Extremities:  No clubbing or cyanosis. Neurologic:  Alert and oriented x 3. Skin:  Intact without  lesions or rashes. Psych:  flat affect   EKG  Procedure date:  06/02/2009  Findings:      sinus rhythm with premature atrial contractions, anterolateral T wave inversions unchanged from previous, QT slightly prolonged compared to previous but not significantly different, overall not a different EKG when compared to multiple previous  Impression & Recommendations:  Problem # 1:  CAD (ICD-414.00) She is having  no new symptoms. She has some chronic atypical discomfort. No further cardiovascular testing is suggested. Orders: EKG w/ Interpretation (93000) TLB-BMP (Basic Metabolic Panel-BMET) (80048-METABOL) TLB-Magnesium (Mg) (83735-MG)  Problem # 2:  FIBRILLATION, ATRIAL (ICD-427.31) I will check a basic metabolic profile and magnesium today. She is tolerating Tikosyn.  I will repeat an EKG in 1 month. Orders: EKG w/ Interpretation (93000)  Problem # 3:  Hx of OBESITY (ICD-278.00) We discussed the need for exercise of some sort or reduced calories as she has gained 32 pounds in 3 years.  Patient Instructions: 1)  Your physician recommends that you schedule a follow-up appointment IN ONE MONTH FOR AN EKG  427.31 2)  Your physician recommends that you continue on your current medications as directed. Please refer to the Current Medication list given to you today. 3)  You have been diagnosed with atrial fibrillation.  Atrial fibrillation is a condition in which one of the upper chambers of the heart has extra electrical cells causing it to beat very fast.  Please see the handout/brochure given to you today for further information. Prescriptions: MAG-OX 400 400 MG TABS (MAGNESIUM OXIDE) Take 1 tablet by mouth two times a day  #90 x 3   Entered by:   Charolotte Capuchin, RN   Authorized by:   Rollene Rotunda, MD, [facility]   Signed by:   Charolotte Capuchin, RN on 06/02/2009   Method used:   Print then Give to Patient   RxID:   [id number] METOPROLOL TARTRATE 50 MG TABS (METOPROLOL TARTRATE) 1 and 1/2 by mouth two times a day  #270 x 3   Entered by:   Charolotte Capuchin, RN   Authorized by:   Rollene Rotunda, MD, [facility]   Signed by:   Charolotte Capuchin, RN on 06/02/2009   Method used:   Print then Give to Patient   RxID:   [id number]

## 2010-03-07 NOTE — Progress Notes (Signed)
  Phone Note Call from Patient Call back at Home Phone [phone] Call back at [phone]   Caller: Patient Call For: Dr. Dayton Martes Summary of Call: Pt. would like to switch from Dr.Tower to Dr.Aron.   Pt said she was sent to [facility] the last time she had a UTI and had to wait 3 hours and she said she has several other reasons she won't see Dr.Tower.  Can she switch to Dr.Aron? Initial call taken by: Beau Fanny,  Jun 22, 2009 9:25 AM  Follow-up for Phone Call        I have not met her so I would ask Phyllis/Cynthia and Dr. Milinda Antis. Ruthe Mannan MD  Jun 22, 2009 9:31 AM  I like her and do not think you would have any problems with her  -- MT Follow-up by: Judith Part MD,  Jun 22, 2009 11:31 AM  Additional Follow-up for Phone Call Additional follow up Details #1::        thanks. ok with me. Ruthe Mannan MD  Jun 22, 2009 11:36 AM     Additional Follow-up for Phone Call Additional follow up Details #2::    I notified pt and she'll call back to set up appt. w/ Dr.Aron. Follow-up by: Beau Fanny,  Jun 22, 2009 1:07 PM

## 2010-03-09 NOTE — Assessment & Plan Note (Signed)
Summary: follow up/mt   Visit Type:  Follow-up Primary Provider:  Judith Part MD  CC:  Atrial fibrillation.  History of Present Illness: The patient presents for followup of atrial fibrillation and coronary disease. Since I last saw her she has had no new acute complaints. She does describe continued or increased lower extremity swelling. She says her legs feel heavy. She is unable to exercise because of knee pain. She denies any chest pressure, neck or arm discomfort. She has not noticed any palpitations, presyncope or syncope. There has been no symptomatic atrial fibrillation. She does report that she needs to much which is part of her weight issue. She has chronic dyspnea but this is unchanged and no PND or orthopnea.   Current Medications (verified): 1)  Metoprolol Tartrate 50 Mg Tabs (Metoprolol Tartrate) .[name].. 1 and 1/2 By Mouth Two Times A Day 2)  Furosemide 40 Mg Tabs (Furosemide) .... One Daily 3)  Klor-Con M20 20 Meq Cr-Tabs (Potassium Chloride Crys Cr) .... Take 2 Daily 4)  Cartia Xt 120 Mg Xr24h-Cap (Diltiazem Hcl Coated Beads) .... Take 1 Capsule Daily 5)  Tikosyn 250 Mcg Caps (Dofetilide) .... Take One Capsule By Mouth Twice A Day 6)  Mag-Ox 400 400 Mg Tabs (Magnesium Oxide) .... Take 1 Tablet By Mouth Two Times A Day 7)  Plavix 75 Mg Tabs (Clopidogrel Bisulfate) .... Take One Tablet By Mouth Daily 8)  Aspirin 81 Mg Tbec (Aspirin) .... Take One Tablet By Mouth Daily  Allergies (verified): 1)  ! Crestor  Past History:  Past Medical History: Reviewed history from 08/24/2008 and no changes required. Headache Hyperlipidemia Hypertension Osteoarthritis Atrial fibrillation (s/p DCCV and Tikosyn) CAD (July 26, 2008, per Dr. Charlies Constable revealing LAD 70-90% proximal, 50% mid.  Circumflex was okay.  RCA was okay.  The stent to the LAD had 90% which had a Liberte 2.75 x 20 mm drug-eluting stent placed reducing it from 90% to 0%.)    Past Surgical History: Reviewed  history from 08/24/2008 and no changes required. Cholecystectomy Hysterectomy- partial, bleeding (1980) Left knee arthroscopy (2000) Right knee arthroscopy (06/1999) Colonoscopy- diverticulosis 92003) EGD- duodenitis (2003) Cataracts Appendectomy Dexa- normal (2002) Tubal ligation Total knee replacement- bilateral  Review of Systems       As stated in the HPI and negative for all other systems.   Vital Signs:  Patient profile:   65 year old female Height:      65 inches Weight:      261 pounds BMI:     43.59 Pulse rate:   70 / minute Resp:     16 per minute BP sitting:   138 / 76  (right arm)  Vitals Entered By: Marrion Coy, CNA (February 28, 2010 10:17 AM)  Physical Exam  General:  Well developed, well nourished, in no acute distress, obese Head:  normocephalic and atraumatic Mouth:  Teeth, gums and palate normal. Oral mucosa normal. Neck:  Neck supple, no JVD. No masses, thyromegaly or abnormal cervical nodes. Chest Wall:  no deformities or breast masses noted Lungs:  Clear bilaterally to auscultation and percussion. Abdomen:  Bowel sounds positive; abdomen soft and non-tender without masses, organomegaly, or hernias noted. No hepatosplenomegaly. Msk:  Back normal, normal gait. Muscle strength and tone normal. Extremities:  back mild bilateral edema and nonpitting edema Neurologic:  Alert and oriented x 3. Skin:  Intact without lesions or rashes. Cervical Nodes:  no significant adenopathy Inguinal Nodes:  no significant adenopathy Psych:  Normal affect.  Detailed Cardiovascular Exam  Neck    Carotids: Carotids full and equal bilaterally without bruits.      Neck Veins: Normal, no JVD.    Heart    Inspection: no deformities or lifts noted.      Palpation: normal PMI with no thrills palpable.      Auscultation: regular rate and rhythm, S1, S2   Vascular    Abdominal Aorta: no palpable masses, pulsations, or audible bruits.      Femoral Pulses: normal  femoral pulses bilaterally.      Pedal Pulses: pulses normal in all 4 extremities    Radial Pulses: normal radial pulses bilaterally.      Peripheral Circulation: no clubbing, cyanosis, or edema noted with normal capillary refill.     EKG  Procedure date:  02/28/2010  Findings:      sinus, rate 70, axis within normal limits, intervals within normal limits, anterolateral T wave inversions unchanged from previous, QTC shorter than previous  Impression & Recommendations:  Problem # 1:  Hx of EDEMA (ICD-782.3) This is her biggest complaint. I will increase her Lasix to 40 mg daily and her potassium 40 meq daily. I will check a basic metabolic profile in 2 weeks. We discussed the risk of dehydration and hypokalemia.  Problem # 2:  HYPERTENSION (ICD-401.9) Her blood pressure is controlled. She would otherwise continue the meds as listed.  Problem # 3:  FIBRILLATION, ATRIAL (ICD-427.31) She seems to be maintaining sinus rhythm and she will continue on Tikosyn Orders: EKG w/ Interpretation (93000)  Problem # 4:  CAD (ICD-414.00) She will coninue with risk reduction. Orders: EKG w/ Interpretation (93000)  Patient Instructions: 1)  Your physician recommends that you schedule a follow-up appointment in: 6 months with Dr Antoine Poche 2)  Your physician has recommended you make the following change in your medication: Increase Lasix to 40 mg a day and Potassium chloride to 20 MEQ 2 tablets a day 3)  Your physician recommends that you return for lab work in:  2 weeks for a BMP  401.1 v58.69 Prescriptions: FUROSEMIDE 40 MG TABS (FUROSEMIDE) one daily  #90 x 3   Entered by:   Charolotte Capuchin, RN   Authorized by:   Rollene Rotunda, MD, [facility]   Signed by:   Charolotte Capuchin, RN on 02/28/2010   Method used:   Faxed to ...       [facility] (mail-order)             , [place]         Ph: [phone]       Fax: [phone]   RxID:   [id number] KLOR-CON M20 20 MEQ CR-TABS  (POTASSIUM CHLORIDE CRYS CR) take 2 daily  #180 x 3   Entered by:   Charolotte Capuchin, RN   Authorized by:   Rollene Rotunda, MD, [facility]   Signed by:   Charolotte Capuchin, RN on 02/28/2010   Method used:   Faxed to ...       [facility] (mail-order)             , [place]         Ph: [phone]       Fax: [phone]   RxID:   [id number] FUROSEMIDE 40 MG TABS (FUROSEMIDE) one daily  #30 x 1   Entered by:   Charolotte Capuchin, RN   Authorized by:   Rollene Rotunda, MD, [facility]   Signed by:   Charolotte Capuchin, RN on 02/28/2010  Method used:   [facility] to        [facility]  [address] (retail)       [address]       [place], [place]  [address]       Ph: [phone] or [phone]       Fax: [phone]   RxID:   [id number]  I have reviewed and approved all prescriptions at the time of this visit. Rollene Rotunda, MD, [facility]  February 28, 2010 11:03 AM

## 2010-03-16 ENCOUNTER — Other Ambulatory Visit: Payer: Self-pay | Admitting: Cardiology

## 2010-03-16 ENCOUNTER — Encounter: Payer: Self-pay | Admitting: Cardiology

## 2010-03-16 ENCOUNTER — Other Ambulatory Visit (INDEPENDENT_AMBULATORY_CARE_PROVIDER_SITE_OTHER): Payer: Medicare Other

## 2010-03-16 DIAGNOSIS — I10 Essential (primary) hypertension: Secondary | ICD-10-CM

## 2010-03-16 DIAGNOSIS — Z79899 Other long term (current) drug therapy: Secondary | ICD-10-CM

## 2010-03-16 LAB — BASIC METABOLIC PANEL
BUN: 22 mg/dL (ref 6–23)
Calcium: 9.5 mg/dL (ref 8.4–10.5)
Chloride: 100 mEq/L (ref 96–112)
GFR: 52.7 mL/min — ABNORMAL LOW (ref >60.00)
Glucose, Bld: 85 mg/dL (ref 70–99)
Potassium: 4.1 mEq/L (ref 3.5–5.1)

## 2010-05-15 LAB — LIPID PANEL
LDL Cholesterol: 146 mg/dL — ABNORMAL HIGH (ref 0–99)
Triglycerides: 169 mg/dL — ABNORMAL HIGH (ref <150)
VLDL: 34 mg/dL (ref 0–40)

## 2010-05-15 LAB — BASIC METABOLIC PANEL
BUN: 17 mg/dL (ref 6–23)
BUN: 19 mg/dL (ref 6–23)
BUN: 20 mg/dL (ref 6–23)
BUN: 21 mg/dL (ref 6–23)
CO2: 25 mEq/L (ref 19–32)
CO2: 27 mEq/L (ref 19–32)
CO2: 28 mEq/L (ref 19–32)
Calcium: 9 mg/dL (ref 8.4–10.5)
Calcium: 9.2 mg/dL (ref 8.4–10.5)
Calcium: 9.3 mg/dL (ref 8.4–10.5)
Calcium: 9.5 mg/dL (ref 8.4–10.5)
Calcium: 9.2 mg/dL (ref 8.4–10.5)
Calcium: 9.2 mg/dL (ref 8.4–10.5)
Calcium: 9.3 mg/dL (ref 8.4–10.5)
Calcium: 9.9 mg/dL (ref 8.4–10.5)
Chloride: 108 mEq/L (ref 96–112)
Chloride: 106 mEq/L (ref 96–112)
Creatinine, Ser: 0.85 mg/dL (ref 0.4–1.2)
Creatinine, Ser: 1.02 mg/dL (ref 0.4–1.2)
Creatinine, Ser: 1.03 mg/dL (ref 0.4–1.2)
Creatinine, Ser: 1.12 mg/dL (ref 0.4–1.2)
Creatinine, Ser: 0.94 mg/dL (ref 0.4–1.2)
Creatinine, Ser: 1.01 mg/dL (ref 0.4–1.2)
Creatinine, Ser: 1.04 mg/dL (ref 0.4–1.2)
Creatinine, Ser: 1.05 mg/dL (ref 0.4–1.2)
GFR calc Af Amer: 59 mL/min — ABNORMAL LOW (ref >60)
GFR calc Af Amer: >60 mL/min (ref >60)
GFR calc Af Amer: >60 mL/min (ref >60)
GFR calc Af Amer: >60 mL/min (ref >60)
GFR calc Af Amer: >60 mL/min (ref >60)
GFR calc Af Amer: >60 mL/min (ref >60)
GFR calc non Af Amer: 49 mL/min — ABNORMAL LOW (ref >60)
GFR calc non Af Amer: 54 mL/min — ABNORMAL LOW (ref >60)
GFR calc non Af Amer: 59 mL/min — ABNORMAL LOW (ref >60)
GFR calc non Af Amer: 53 mL/min — ABNORMAL LOW (ref >60)
GFR calc non Af Amer: 55 mL/min — ABNORMAL LOW (ref >60)
GFR calc non Af Amer: 60 mL/min — ABNORMAL LOW (ref >60)
Glucose, Bld: 104 mg/dL — ABNORMAL HIGH (ref 70–99)
Glucose, Bld: 111 mg/dL — ABNORMAL HIGH (ref 70–99)
Glucose, Bld: 92 mg/dL (ref 70–99)
Glucose, Bld: 97 mg/dL (ref 70–99)
Glucose, Bld: 99 mg/dL (ref 70–99)
Potassium: 4 mEq/L (ref 3.5–5.1)
Potassium: 3.7 mEq/L (ref 3.5–5.1)
Potassium: 3.9 mEq/L (ref 3.5–5.1)
Potassium: 4.2 mEq/L (ref 3.5–5.1)
Potassium: 4.2 mEq/L (ref 3.5–5.1)
Potassium: 4.3 mEq/L (ref 3.5–5.1)
Sodium: 139 mEq/L (ref 135–145)
Sodium: 140 mEq/L (ref 135–145)
Sodium: 141 mEq/L (ref 135–145)
Sodium: 142 mEq/L (ref 135–145)

## 2010-05-15 LAB — PROTIME-INR
INR: 1.6 — ABNORMAL HIGH (ref 0.00–1.49)
INR: 2.4 — ABNORMAL HIGH (ref 0.00–1.49)
INR: 2.7 — ABNORMAL HIGH (ref 0.00–1.49)
INR: 2.8 — ABNORMAL HIGH (ref 0.00–1.49)
Prothrombin Time: 15.8 seconds — ABNORMAL HIGH (ref 11.6–15.2)
Prothrombin Time: 20 seconds — ABNORMAL HIGH (ref 11.6–15.2)
Prothrombin Time: 31.1 seconds — ABNORMAL HIGH (ref 11.6–15.2)

## 2010-05-15 LAB — POCT I-STAT, CHEM 8
BUN: 19 mg/dL (ref 6–23)
Chloride: 104 mEq/L (ref 96–112)
Glucose, Bld: 145 mg/dL — ABNORMAL HIGH (ref 70–99)
HCT: 41 % (ref 36.0–46.0)
Hemoglobin: 13.9 g/dL (ref 12.0–15.0)
Sodium: 137 mEq/L (ref 135–145)
TCO2: 24 mmol/L (ref 0–100)

## 2010-05-15 LAB — HEMOGLOBIN A1C: Hgb A1c MFr Bld: 5.4 % (ref 4.6–6.1)

## 2010-05-15 LAB — HEPARIN LEVEL (UNFRACTIONATED)
Heparin Unfractionated: 0.43 IU/mL (ref 0.30–0.70)
Heparin Unfractionated: 0.44 IU/mL (ref 0.30–0.70)
Heparin Unfractionated: 0.56 IU/mL (ref 0.30–0.70)

## 2010-05-15 LAB — CBC
HCT: 37.5 % (ref 36.0–46.0)
HCT: 41.9 % (ref 36.0–46.0)
Hemoglobin: 12.8 g/dL (ref 12.0–15.0)
Hemoglobin: 13.9 g/dL (ref 12.0–15.0)
MCHC: 33.3 g/dL (ref 30.0–36.0)
MCHC: 34.1 g/dL (ref 30.0–36.0)
MCHC: 34.1 g/dL (ref 30.0–36.0)
MCV: 81.5 fL (ref 78.0–100.0)
MCV: 81.9 fL (ref 78.0–100.0)
Platelets: 198 10*3/uL (ref 150–400)
Platelets: 217 10*3/uL (ref 150–400)
Platelets: 244 10*3/uL (ref 150–400)
RBC: 4.6 MIL/uL (ref 3.87–5.11)
RBC: 4.9 MIL/uL (ref 3.87–5.11)
RBC: 5.1 MIL/uL (ref 3.87–5.11)
RDW: 14.3 % (ref 11.5–15.5)
RDW: 14.6 % (ref 11.5–15.5)
RDW: 14.6 % (ref 11.5–15.5)
WBC: 10 10*3/uL (ref 4.0–10.5)
WBC: 9.3 10*3/uL (ref 4.0–10.5)
WBC: 9.4 10*3/uL (ref 4.0–10.5)
WBC: 9.5 10*3/uL (ref 4.0–10.5)

## 2010-05-15 LAB — POCT CARDIAC MARKERS

## 2010-05-15 LAB — CARDIAC PANEL(CRET KIN+CKTOT+MB+TROPI)
CK, MB: 16.3 ng/mL — ABNORMAL HIGH (ref 0.3–4.0)
Total CK: 185 U/L — ABNORMAL HIGH (ref 7–177)

## 2010-05-15 LAB — TROPONIN I: Troponin I: 1.59 ng/mL (ref 0.00–0.06)

## 2010-05-15 LAB — DIFFERENTIAL: Monocytes Absolute: 0.5 10*3/uL (ref 0.1–1.0)

## 2010-05-15 LAB — MAGNESIUM
Magnesium: 1.9 mg/dL (ref 1.5–2.5)
Magnesium: 2.3 mg/dL (ref 1.5–2.5)
Magnesium: 2.6 mg/dL — ABNORMAL HIGH (ref 1.5–2.5)

## 2010-06-20 NOTE — Discharge Summary (Signed)
NAMECHELISA, [name]                 ACCOUNT NO.:  [id number]   MEDICAL RECORD NO.:  [id number]          PATIENT TYPE:  INP   LOCATION:  1422                         FACILITY:  [facility]   PHYSICIAN:  [name], M.D.  DATE OF BIRTH:  [birth year]   DATE OF ADMISSION:  07/04/2006  DATE OF DISCHARGE:  07/09/2006                               DISCHARGE SUMMARY   ADMISSION DIAGNOSES:  1. End-stage osteoarthrosis left knee.  2. Hypertension.  3. Generalized osteoarthritis.  4. Hyperlipidemia.   DISCHARGE DIAGNOSES:  1. Status post left total knee arthroplasty.  2. Postoperative new-onset atrial fibrillation.  3. Status post cardioversion.   OPERATIONS:  1. Left total knee arthroplasty.  Surgeon of record Dr. Rennis Chris,      assistant French Ana [name] P.A.-C., under a spinal anesthetic.  2. Postoperative cardioversion.  Cardiologist was Dr. Antoine Poche.   CONSULTS:  Cardiology:  Dr. Tenny Craw and Dr. Dietrich Pates, as well as Dr.  Antoine Poche.   BRIEF HISTORY:  [name] is a 65 year old female well-known to [place] with  end-stage osteoarthrosis of her left knee.  She has failed outpatient  conservative measures and had done well with her previous right total  knee arthroplasty.  She was ready for her planned left total knee  arthroplasty.  Risks and benefits discussed with the patient and she was  in agreement and wished to proceed.   HOSPITAL COURSE:  The patient was admitted, underwent the above-named  procedure and tolerated this well.  Postoperatively, on postoperative  night #1, was noted to be extremely tachycardic and on EKG was noted to  be in atrial fibrillation.  Cardiology consult was called and with her  history and new-onset atrial fibrillation, after thorough evaluation  they felt cardioversion was indicated.  The patient did undergo the  first few days of successful formal physical therapy and did well from  the standpoint in regards to her knee replacement.  On July 09, 2006,  following the  weekend, she was scheduled for elective cardioversion for  symptomatic atrial fibrillation.  She underwent this successfully and  did extremely well.  They managed her medications and all in all Ms.  [name] did well from an orthopedic and remained stable cardiac-wise  postoperatively.  She was on Coumadin for DVT and PE prophylaxis, which  will be continued for a total of 4 weeks at the recommendation of  cardiology.  She received the appropriate IV antibiotics, analgesics  intravenously, and was weaned to p.o.  By July 09, 2006, the patient was  doing extremely well from a physical therapy standpoint and was in  normal sinus rhythm and was anxious to return home.  On this date her  incision was clean and dry.  She was stable, afebrile, and was  ambulating distances with therapy and was stable for discharge to home.   LABORATORY DATA:  Shows admission hemogram was within normal limits.  Postoperatively, she was stable, mild anemia to 10.0 postoperatively.  Chemistries showed within normal limits.  Protimes followed by pharmacy  on DVT and PE prophylaxis with Coumadin as well as for prophylaxis from  her atrial fibrillation.  Urinalysis was clear.  Her CK and CK-MBs  remained normal; please see chart for detail.  TSH also 1.027, which was  normal.  Multiple EKGs in the chart show atrial fibrillation with rapid  ventricular response.  Her admission EKG showed normal sinus rhythm and  her discharge EKG on June 3 showed normal sinus rhythm.   CONDITION ON DISCHARGE:  Stable and improved.   DISCHARGE MEDICATIONS AND PLANS:  The patient is being discharged to  home.  She will follow up with orthopedics in 2 weeks, call for time.  Toprol-XL and Cardizem were added per cardiology and will continue this  until further notice.  She is also on Coumadin.  She will resume regular  home diet.  Home health PT and OT will be taking care of her via  [place].  Follow up with cardiology per their  recommendations.      [name], P.A.-C.      [name], M.D.  Electronically Signed    TAS/MEDQ  D:  08/22/2006  T:  08/22/2006  Job:  [id number]   cc:   Rollene Rotunda, MD, [facility]  [address]  [address]  [place]  [place] [address]   Pricilla Riffle, MD, [facility]  [address]  [address]  [place]  [place] [address]   Gerrit Friends. Dietrich Pates, MD, [facility]  [address]  [place], [place] [address]

## 2010-06-20 NOTE — Consult Note (Signed)
NAMEELIANIS, [name]                 ACCOUNT NO.:  [id number]   MEDICAL RECORD NO.:  [id number]          PATIENT TYPE:  INP   LOCATION:  1603                         FACILITY:  [facility]   PHYSICIAN:  Greggory Stallion L. Pernell Dupre, MD    DATE OF BIRTH:  27-Jul-1945   DATE OF CONSULTATION:  07/05/2006  DATE OF DISCHARGE:                                 CONSULTATION   REASON FOR CONSULTATION:  Atrial fibrillation with RVR, status post left  knee replacement.   TIME:  07/05/2006, 2:30 a.m.   PROBLEM LIST:  1. Hypertension.  2. Osteoarthritis.  3. Hyperlipidemia.  4. Status post right total knee arthroplasty 05/2005.  5. Coronary artery disease, cath 10/2004:  LAD with moderate disease.  6. Obstructive sleep apnea on CPAP.   HISTORY OF PRESENT ILLNESS:  A 65 year old female with a history of  hypertension and hyperlipidemia, CAD, status post left knee replacement,  who went into AFib with RVR, the reason we were consulted, measured rate  at 130.  The patient denies any symptoms including chest pain, shortness  of breath, PND, orthopnea, lower extremity edema, or syncope.   SOCIAL HISTORY:  No tobacco, alcohol, or drugs.   FAMILY HISTORY:  Noncontributory.   ALLERGIES:  No known drug allergies.   MEDICATIONS:  Dilaudid 7.5 daily.   PHYSICAL EXAMINATION:  GENERAL:  Alert and oriented times three, no  apparent distress.  VITAL SIGNS:  Afebrile at 96.8, pulse 126, blood pressure 147/74.  HEENT:  Pupils equally round and reactive to light, extraocular  movements intact, no lymphadenopathy, no carotid bruits, no thyromegaly.  CHEST:  Clear to auscultation bilaterally.  CARDIOVASCULAR:  Irregularly irregular, no murmurs, rubs, or gallops.  ABDOMEN:  Soft, nontender, nondistended, positive bowel sounds.  EXTREMITIES:  No clubbing or cyanosis.  Strong pulses.   LABORATORY DATA:  EKG:  AFib at 110, LVH with nonspecific ST and T-wave  changes.  Last labs 06/27/2006:  Sodium 143, potassium 4.2, BUN 14,  creatinine 0.9, glucose 100, white count 9, H&H 14 and 40, platelets  288,000.  PTT 29, INR 1.0, PT 13.6.   ASSESSMENT AND PLAN:  Atrial fibrillation with rapid ventricular  response:  1. We will control the heart rate with Toprol 25 mg q.12 and titrate      to effect.  2. Start heparin without bolus.  3. Continue the Coumadin.  4. If the patient does not cardiovert spontaneously in 24 hours, we      will plan cardioversion.  5. Also transthoracic echo.  Check TSH.  Will order three sets cardiac      enzymes.  Will transfer to telemetry.      Greggory Stallion L. Pernell Dupre, MD  Electronically Signed     GLA/MEDQ  D:  07/05/2006  T:  07/05/2006  Job:  [id number]

## 2010-06-20 NOTE — Assessment & Plan Note (Signed)
[facility]                            CARDIOLOGY OFFICE NOTE   NAME:[name], [name]                        MRN:          [id number]  DATE:10/20/2007                            DOB:          April 15, 1945    PRIMARY CARE PHYSICIAN:  Marne A. Tower, MD.   REASON FOR PRESENTATION:  Evaluate patient with atrial fibrillation.   HISTORY OF PRESENT ILLNESS:  The patient presents for followup of the  above.  She underwent a cardioversion on 09/16/2007.  This converted her  to sinus rhythm.  Since that time, she has had 2 episodes.  These were  feelings of being very hot and breaking out in a cold sweat.  She would  get some chest tightness.  It lasted about 5 minutes.  It happened  spontaneously.  It is moderate.  She is not describing arm or neck  discomfort.  It goes away on its own.  Similar to symptoms she had back  in 2006, at which time she had her cardiac catheterization.  She does  not feel her heart racing or skipping.  She has not felt any  palpitations and has had no presyncope or syncope.  She does get  somewhat anxious with these episodes and wondered if they might be  panic.  She is not having any resting shortness of breath.  She denies  any PND or orthopnea.   PAST MEDICAL HISTORY:  Coronary artery disease (left main normal, LAD  50%-60% proximal stenosis, 40%-50% mid stenosis, and 30%-40% stenosis  distally.  The EF was 65%-70%.  This was cathed in 2006), postoperative  atrial fibrillation, status post cardioversion, osteoarthritis,  hyperlipidemia, status post right total knee arthroplasty, status post  left total knee replacement, obstructive sleep apnea with poor CPAP  compliance, and laparoscopic cholecystectomy.   ALLERGIES:  None.   MEDICATIONS:  Bisoprolol 50 mg daily, Coumadin, potassium 10 mEq daily,  and Cartia 120 mg daily.   REVIEW OF SYSTEMS:  As stated in the HPI and otherwise negative for all  other systems.   PHYSICAL  EXAMINATION:  GENERAL:  The patient is in no acute distress.  VITAL SIGNS:  Blood pressure 162/81, heart rate 78 and regular, and  weight 251pounds.  Body mass index 42.  HEENT:  Eyelids remarkable.  Pupils are equal, round, and react to  light; fundi not visualized; oral mucosa unremarkable.  NECK:  No jugular venous distention at 45 degrees. Carotid upstroke  brisk and symmetrical.  No bruits or thyromegaly.  LYMPHATICS:  No cervical, axillary, or inguinal adenopathy.  LUNGS:  Clear to auscultation bilaterally.  BACK:  No costovertebral angle tenderness.  CHEST:  Unremarkable.  HEART:  PMI not displaced or sustained, S1 and S2 within normal limits,  no S3, and no S4.  No clicks, no rubs, and no murmurs.  ABDOMEN:  Mildly obese, positive bowel sounds, normal in frequency and  pitch.  No bruits, no rebound, no guarding.  No midline pulsatile mass,  no hepatomegaly, and no splenomegaly.  SKIN:  No rashes, no nodules.  EXTREMITIES:  2+ pulses throughout,  no edema.  No cyanosis or clubbing.  NEUROLOGIC:  Oriented to person, place, and time.  Cranial nerves II  through XII grossly intact, motor grossly intact.   ASSESSMENT AND PLAN:  1. Atrial fibrillation.  The patient is status post cardioversion.  At      this point, I will keep her on the Coumadin.  I might consider      discontinuing this at 6 months if I see no recurrent atrial      fibrillation, but she and I will discuss this.  2. Coronary artery disease.  The patient has nonobstructive coronary      artery disease as diagnosed in 2006.  She is having some episodes      of chest discomfort.  She had up to a 70% lesion back then.      Therefore, it is prudent to screen her with a stress test to make      sure she has not had any progression.  She states she would not be      able to walk on a treadmill as she could not in the past.      Therefore, she will have an adenosine Cardiolite.  3. Hypertension, blood pressure is slightly  elevated.  I am going to      increase her metoprolol to 50 mg twice a day.  4. Obesity.  She understands the need to lose weight with diet and      exercise.  5. Dyslipidemia.  This is followed by Dr. Milinda Antis.  Given her coronary      disease, I would suggest to goal cholesterol with an LDL less than      100 and HDL greater than 50.  6. Followup.  I would like to see her back in about 4 months or sooner      if needed.     Rollene Rotunda, MD, [facility]  Electronically Signed    JH/MedQ  DD: 10/20/2007  DT: 10/21/2007  Job #: [id number]   cc:   Marne A. Milinda Antis, MD

## 2010-06-20 NOTE — H&P (Signed)
[name], GALLUS                 ACCOUNT NO.:  [id number]   MEDICAL RECORD NO.:  [id number]          PATIENT TYPE:  OIB   LOCATION:  2899                         FACILITY:  [facility]   PHYSICIAN:  Rollene Rotunda, MD, FACCDATE OF BIRTH:  1946-01-17   DATE OF ADMISSION:  09/16/2007  DATE OF DISCHARGE:                              HISTORY & PHYSICAL   PRIMARY CARE PHYSICIAN:  Marne A. Tower, MD   REASON FOR PRESENTATION:  Evaluate the patient's atrial fibrillation.   HISTORY OF PRESENT ILLNESS:  The patient presents for followup of atrial  fibrillation.  She had this initially after a knee arthroplasty in 2008.  She underwent cardioversion to maintain sinus rhythm.  However, when I  saw her in April 2009, she was back in atrial fibrillation.  She was  having fatigue and shortness of breath that is extensively outlined in  that note.  Initially, we discussed treatment with Tikosyn and  cardioversion to try to maintain sinus rhythm.  It was the plan.  However, she decided not to take Tikosyn.  We did agree on cardioversion  to see if she maintains sinus rhythm without drugs.  She feels fatigued,  short of breath, but has not been having any new symptoms.  She had no  PND or orthopnea.  She does not notice any palpitations.  Her symptoms  are, otherwise, extensively outlined in June 05, 2007, note.   PAST MEDICAL HISTORY:  1. Coronary artery disease (left main normal, LAD 50-70% proximal      stenosis, 40-50% stenosis in the mid vessel, followed by 30-40%      stenosis, the EF of 65-70%).  2. Osteoarthritis.  3. Hyperlipidemia.  4. Status post right total knee arthroplasty.  5. Status post left total knee replacement.  6. Obstructive sleep apnea with poor CPAP compliance.  7. Atrial fibrillation, recurrent.  8. Laparoscopic cholecystectomy.   ALLERGIES:  None.   MEDICATIONS:  1. Metoprolol 50 mg b.i.d.  2. Cartia 120 mg daily.  3. Crestor 10 mg daily.  4. Coumadin.   SOCIAL  HISTORY:  The patient does not smoke cigarettes.  She does not  drink alcohol.  She is married.   FAMILY HISTORY:  Noncontributory.   REVIEW OF SYSTEMS:  As stated in the HPI, and otherwise negative for  other systems.   PHYSICAL EXAMINATION:  GENERAL:  The patient is in no distress.  VITAL SIGNS:  Blood pressure 140/80, heart rate 50 and irregular,  afebrile.  HEENT:  Eyes are unremarkable, pupils equal, round, and reactive to  light.  Fundi not visualized.  NECK:  No jugular distention, waveform within normal limits.  Carotid  upstroke brisk and symmetric.  LUNGS:  Clear to auscultation bilaterally.  HEART:  PMI not displaced or sustained, S1 and S2 within normal limits.  No S3, no murmurs.  ABDOMEN:  Obese, positive bowel sounds, normal frequency and pitch.  No  bruits, rebound, or guarding.  No midline pulsatile mass or  organomegaly.  SKIN:  No rashes, no nodules.  EXTREMITIES:  2+ pulses, no edema.   EKG; atrial fibrillation  with rate in the 50s-90s, axis within normal  limits, anterolateral T-wave inversions unchanged from previous.   ASSESSMENT AND PLAN:  Atrial fibrillation.  The patient is here today  for elective cardioversion.  She has been adequately anticoagulated and  this has been confirmed.  She understands the risks and benefits and we  will proceed.      Rollene Rotunda, MD, [facility]  Electronically Signed     JH/MEDQ  D:  09/16/2007  T:  09/17/2007  Job:  [id number]

## 2010-06-20 NOTE — Cardiovascular Report (Signed)
[name], [name]                 ACCOUNT NO.:  [id number]   MEDICAL RECORD NO.:  [id number]           PATIENT TYPE:   LOCATION:                                 FACILITY:   PHYSICIAN:  Bruce R. Juanda Chance, MD, FACCDATE OF BIRTH:  05-May-1945   DATE OF PROCEDURE:  07/26/2008  DATE OF DISCHARGE:                            CARDIAC CATHETERIZATION   CLINICAL HISTORY:  [name] is 65 years old and has a history of atrial  fibrillation, which has been controlled with Tikosyn, and she is on  chronic Coumadin therapy for this.  She also has a history of  hypertension, hyperlipidemia, and previous nonobstructive coronary  disease at catheterization.  She was admitted to the hospital with chest  pain and positive troponins and anterior T-wave inversions, consistent  with a non-ST-elevation myocardial infarction.  She is scheduled for  angiography today.   PROCEDURE:  The procedure was performed via the right femoral artery  using an arterial sheath and 5-French preformed coronary catheters.  A  front wall arterial puncture was performed, and Omnipaque contrast was  used.  At completion of the diagnostic study, we decided to proceed with  IVUS and probable intervention on the lesion in the LAD.   The patient was given Angiomax bolus and infusion.  She was given 600 mg  of Plavix and previously had chewable aspirin.  We used an XB LAD 3.5  guiding catheter with side holes.  We passed a Prowater wire down the  LAD across the lesion in the proximal LAD without difficulty.  We then  passed an Atlantis IVUS catheter across the lesion in the proximal LAD  and did automatic pullback.  This documented 2 tight lesions, located at  the first septal perforator and just proximal in the first septal  perforator.  There was also diffuse disease in the midvessel.  We wanted  to try and get by with a bare-metal stent because of chronic Coumadin  therapy, and we did not feel the disease in the midvessel was  tight  enough that it mandated stenting.  Based on these considerations, we  chose a 2.75- x 20-mm VeriFlex bare-metal stent.  We predilated the  lesion with a 2.5- x 15-mm Apex balloon performing 1 inflation up to 10  atmospheres for 30 seconds.  We then deployed the 2.75 x 20 mm VeriFlex  stent overlapping the diagonal branch.  We deployed this with 1  inflation of 14 atmospheres for 30 seconds.  We then postdilated with a  3.5- x 15-mm [place] Apex performing 3 inflations up to 16 atmospheres for 30  seconds.  There was quite a bit of tapering of the vessel from the  proximal edge of the stent to the distal edge of the stent.  It was 3.5  in luminal diameter proximally and 2.75 distally by IVUS.  Final  diagnostics were then performed through the guiding catheter.  The  patient tolerated the procedure well and left the laboratory in  satisfactory condition.   RESULTS:  The left main coronary artery:  The left main coronary artery  was free of significant disease.   Left anterior descending artery:  The left anterior descending artery  gave rise to a septal perforator, a large diagonal branch, a second  diagonal branch, and multiple septal perforators.  There was 70%  narrowing before this septal perforator, and there was a 90% lesion  located right at the first diagonal branch.  There was 50% lesion in the  mid vessel before the second diagonal branch.   The circumflex artery:  The circumflex artery gave rise to a marginal  branch and AV branch and atrial branch.  These vessels were free of  significant disease.   The right coronary artery:  The right coronary artery gave rise to a  right ventricular branch, a posterior descending branch, and 2  posterolateral branches.  These vessels were free of significant  disease.   The left ventriculogram:  The left ventriculogram was performed in the  RAO projection showed good wall motion, no areas of hypokinesis.  The  estimated ejection  fraction was 60%.   Following stenting of the lesions in the proximal LAD, the 70 and 90%  stenoses improved to 0%.  There was residual 50% narrowing in the mid  LAD.   The aortic pressure was 133/57 with mean of 85 and left ventricular  pressure was 135/21.   CONCLUSION:  1. Coronary artery disease with 70% proximal and 90% proximal and 50%      mid stenosis in the left anterior descending, no significant      obstruction in circumflex and right coronary artery and normal left      ventricular function.  2. Successful percutaneous coronary intervention of the lesions in the      proximal left anterior descending with improvement in center      narrowing from 90% to 0% using a Liberte bare-metal stent.   DISPOSITION:  The patient returned to post angio room for further  observation.      Bruce Elvera Lennox Juanda Chance, MD, [facility]  Electronically Signed     BRB/MEDQ  D:  07/26/2008  T:  07/27/2008  Job:  [id number]   cc:   Marne A. Tower, MD  Rollene Rotunda, MD, [facility]

## 2010-06-20 NOTE — Op Note (Signed)
[name], [name]                 ACCOUNT NO.:  [id number]   MEDICAL RECORD NO.:  [id number]          PATIENT TYPE:  OIB   LOCATION:  2899                         FACILITY:  [facility]   PHYSICIAN:  Rollene Rotunda, MD, FACCDATE OF BIRTH:  May 13, 1945   DATE OF PROCEDURE:  09/16/2007  DATE OF DISCHARGE:  09/16/2007                               OPERATIVE REPORT   PRIMARY CARE PHYSICIAN:  Marne A. Tower, MD   PROCEDURE:  Direct current cardioversion.   INDICATIONS:  Symptomatic atrial fibrillation.   PROCEDURE NOTE:  The patient signed appropriate informed consent.  We  made sure that her Coumadin was therapeutic 4 weeks prior to this.  She  has been through this procedure before and understood the risks and  benefits clearly.  Anesthesia was provided by Dr. Gypsy Balsam with Pentothal.  Her vitals were monitored by the Anesthesiology Service and she was bag  ventilated.  She was cardioverted x1 with 150 biphasic joules.  This did  not result in sinus rhythm.  However, 200 synchronized biphasic joules  cardioversion did result in sinus rhythm.  There are no apparent  complications.  EKG is pending.   PLAN:  The patient will continue with current regimen of medications.  She understands that should she have any recurrence, we might need to  consider Tikosyn.  She will remain on anticoagulation.  Of note, she has  had some edema and I will be giving her Lasix 20 mg.  She is going to  increase her potassium to 40 mEq daily.  She is to get a BMET in 2  weeks.      Rollene Rotunda, MD, [facility]  Electronically Signed     JH/MEDQ  D:  09/16/2007  T:  09/17/2007  Job:  [id number]   cc:   Marne A. Milinda Antis, MD

## 2010-06-20 NOTE — Discharge Summary (Signed)
NAMEDEVLYN, PARISH                 ACCOUNT NO.:  [id number]   MEDICAL RECORD NO.:  [id number]          PATIENT TYPE:  INP   LOCATION:  2507                         FACILITY:  [facility]   PHYSICIAN:  Rollene Rotunda, MD, FACCDATE OF BIRTH:  1946/01/28   DATE OF ADMISSION:  07/22/2008  DATE OF DISCHARGE:  07/27/2008                               DISCHARGE SUMMARY   ADDENDUM:  Time spent with the patient to go over medications and  discharge instructions along with physician time 40 minutes.      Bettey Mare. Lyman Bishop, NP      Rollene Rotunda, MD, [facility]  Electronically Signed    KML/MEDQ  D:  07/27/2008  T:  07/28/2008  Job:  [id number] [id number]

## 2010-06-20 NOTE — Assessment & Plan Note (Signed)
[facility]                            CARDIOLOGY OFFICE NOTE   NAME:[name], [name] CUNNING                        MRN:          [id number]  DATE:01/09/2008                            DOB:          September 07, 1945    PRIMARY CARE PHYSICIAN:  Marne A. Tower, MD   REASON FOR PRESENTATION:  Evaluate the patient with atrial fibrillation.   HISTORY OF PRESENT ILLNESS:  The patient is a 65 year old.  She presents  for followup of the above.  Since I last saw her, she has felt better  than she has in a long time.  She has not had any chest discomfort.  She  was describing this at the last visit, and we did a stress perfusion  study which demonstrated an EF of 78%.  There was no evidence of  ischemia.  She has had no palpitations.  She has had no presyncope or  syncope.  She denies any shortness of breath.  She does not exercise as  much as I would hope because of knee problems.  She wants to lose weight  in hopes this will help.   PAST MEDICAL HISTORY:  1. Coronary artery disease (see the October 20, 2007, note for      details).  2. Postoperative atrial fibrillation status post cardioversion.  3. Osteoarthritis.  4. Hyperlipidemia.  5. Status post right total knee arthroplasty.  6. Status post left total knee replacement.  7. Obstructive sleep apnea with poor CPAP compliance.  8. Laparoscopic cholecystectomy.   ALLERGIES:  None.   MEDICATIONS:  1. Crestor 10 mg daily.  2. Cartia 120 mg daily.  3. Coumadin.  4. Metoprolol 50 mg b.i.d.   REVIEW OF SYSTEMS:  As stated in the HPI and otherwise negative for  other systems.   PHYSICAL EXAMINATION:  GENERAL:  The patient is in no distress.  VITAL SIGNS:  Blood pressure 136/74, heart rate 64 and regular.  Weight  256 pounds, body mass index 42.  HEENT:  Eyes unremarkable, pupils equal, round, and reactive to light,  fundi not visualized, oral mucosa unremarkable.  NECK:  No jugular venous distention at 45  degrees, carotid upstroke  brisk and symmetric, no bruits, no thyromegaly.  LYMPHATICS:  No cervical, axillary, inguinal adenopathy.  LUNGS:  Clear to auscultation bilaterally.  BACK:  No costovertebral angle tenderness.  CHEST:  Unremarkable.  HEART:  PMI not displaced or sustained, S1 and S2 within normal limits,  no S3, no S4, no clicks, no rubs, no murmurs.  ABDOMEN:  Obese, positive bowel sounds normal in frequency and pitch, no  bruits, no rebound, no guarding, no midline pulsatile mass, no  organomegaly.  SKIN:  No rashes, no nodules.  EXTREMITIES:  Pulses 2+, nonpitting lower extremity edema.  NEUROLOGIC:  Grossly intact.   EKG:  Sinus rhythm, rate 64, axis within normal limits, intervals within  normal limits, inferolateral T-wave inversions unchanged from previous.   ASSESSMENT AND PLAN:  1. Atrial fibrillation.  The patient remains in sinus rhythm.  At this      point, that given her risk  factors and sleep apnea, I think she is      at high risk for recurrent atrial fibrillation.  She is not having      any problems from the Coumadin.  Therefore, it is probably most      prudent to continue this.  If she has any contraindications to      Coumadin going forward, we could discontinue.  She will remain on      the other meds as listed.  2. Coronary disease.  She does have coronary disease, but no chest      pain.  She should continue with secondary risk reduction.  3. Obesity.  I am proud of the fact that she wants to lose weight, and      she will be working on this.  I encouraged her to explore the [facility] Diet.  4. Dyslipidemia, per Dr. Milinda Antis.  The goal should be an LDL less than      100 and HDL greater than 50.  5. Followup.  I will see the patient again in 1 year or sooner if      needed.     Rollene Rotunda, MD, [facility]  Electronically Signed    JH/MedQ  DD: 01/09/2008  DT: 01/09/2008  Job #: [id number]   cc:   Marne A. Milinda Antis, MD

## 2010-06-20 NOTE — Op Note (Signed)
NAMEGERALENE, [name]                 ACCOUNT NO.:  [id number]   MEDICAL RECORD NO.:  [id number]          PATIENT TYPE:  INP   LOCATION:  X001                         FACILITY:  [facility]   PHYSICIAN:  Vania Rea. Supple, M.D.  DATE OF BIRTH:  03/14/1945   DATE OF PROCEDURE:  07/04/2006  DATE OF DISCHARGE:                               OPERATIVE REPORT   PREOPERATIVE DIAGNOSIS:  End-stage left knee osteoarthritis.   POSTOPERATIVE DIAGNOSIS:  End-stage left knee osteoarthritis.   PROCEDURE:  Left total knee arthroscopy utilizing a cemented Dupuy Sigma  implant, size #4 femur, size #4 tibia, a 10 mm thick rotating platform  polyethylene insert and a 35 mm patellar button.   SURGEON:  Vania Rea. Supple, M.D.   Threasa HeadsFrench Ana A. Shuford, P.A.-C.   ANESTHESIA:  Spinal anesthesia.   TOURNIQUET TIME:  Was one hour and 13 minutes.   ESTIMATED BLOOD LOSS:  Minimal.   DRAINS:  None.   INDICATIONS FOR PROCEDURE:  Ms. Sabey is a 65 year old female who has had  chronic bilateral knee pain secondary to underlying arthrosis and has  previously undergone a right total knee arthroplasty and has done very  well clinically, and now presents for a planned left total knee  arthroplasty.   She was preoperatively counseled on the treatment options, as well as  the risks and benefits thereof.  Possible surgical complications of  bleeding, infection, neurovascular injury, deep venous thrombosis,  pulmonary embolism, persistence of pain, loss of motion and possible  need for revision surgery are reviewed.  She understands and accepts and  agrees to our planned procedure.   DESCRIPTION OF PROCEDURE:  After undergoing routine preoperative  evaluation, the patient received prophylactic antibiotics.  The patient  was brought to the operating room and had a spinal anesthetic by the  anesthesia department.  She was then placed supine with a Foley catheter  placed.  Turned __________  left thigh and the left  leg was sterilely  prepped and draped in the standard fashion.  The leg was exsanguinated  with the tourniquet inflated to 375 mmHg.  An anterior midline incision  was then made approximately 20 cm in length, centered over the patella  in the anterior midline.  Dissection carried down sharply to the deep  fascia and the skin flaps were elevated and tied back and electrocautery  was used for hemostasis.  A medial parapatellar arthrotomy was then  performed.  The patella everted.  The infra __________  fat pad was  excised in its majority and the remnants of the ceiling were removed.  The patella was everted and the knee was then flexed up.  The remnants  of the menisci were removed.  A drill was then used to gain access into  the intramedullary canal.  An intramedullary guide was then passed into  the femur and a 5 mm valgus cut was made, removing 11 mm of bone from  the distal femur.  The distal femur was then sized and a size #4 implant  had the best fit.  We then placed the size #4  cutting guide and made the  anterior, posterior and chamfer cuts on the distal femur.  We then  trailed the femoral implant, showing a good fit.   Attention was then directed to the proximal tibia with the knee flexed  up.  An extramedullary guide was then placed, and maintaining  proper  degree of alignment and posterior slope, we made a cut, removing  approximately 10 mm of bone from the proximal tibia utilizing the  oscillating saw.  Once this resection was then completed, the proximal  tibia was incised and a size #4 had the best coverage and fit.  This was  temporarily pinned into position and then the trials were placed.  The  knee was taken through a range of motion.  There was good soft tissue  stability and good soft tissue balance.  The flexion extension gaps were  symmetric.  At this point we then terminally prepared the proximal tibia  utilizing the intramedullary reamer and then broach.  We then  returned  attention to the distal femur, where the box cutting guide was then  placed, and we completed the box cut, utilizing an oscillating saw.  Remnants of the cruciate ligaments were then removed.  We cleaned the  posterior aspect of the joint and did find a large ostial chondral loose  body in the posterior medial compartment which was removed.  The  remnants of the menisci were removed.  We used an osteotome to remove  osteophytes from the posterior aspect of the femoral condyles.  We took  care to confirm that all residual debris and cartilage tissue was  removed from the posterior compartments.   Attention was then turned to the patella and utilizing an oscillating  saw, I resected approximately 8 mm of bone.  Stabilizing drill holes  were then made and the 35 mm patella button had an excellent fit.  Final  trial reduction was performed of the implant, showing good soft tissue  balance and full extension of the knee.  The trial implants were all  removed.  The joint was then pulse lavaged and meticulously cleaned.  Cement was then mixed on the back table and of the appropriate  consistency.  The implants were then cemented into position, beginning  with the tibia and then the femur and then the patella.  Meticulous  removal of all extra cement was completed.  Once the cement was  hardened, we then performed again a trial reduction and with the 10 mm  implant, there was full extension and good soft tissue balance.  We  removed the trial and then terminally irrigated and cleaned the joint.  All residual cement debris was meticulously removed.  The final 10 mm  thick rotating platform insert was inserted.  The knee was then taken  through a range of motion.  There was normal patellar tracking.  The  tourniquet was then let down.  A Hemovac drain was brought out  superolaterally.  Hemostasis was obtained.  A wound closure was performed with interrupted figure-of-eight #1 Vicryl for  the  parapatellar arthrotomy, #1 Vicryl for the deep subcutaneous layer, #2-0  Vicryl for the superficial subcutaneous layer and inter-cuticular #3-0  Monocryl for the skin, followed by Steri-Strips.  A dry dressing was  then placed on the left knee.  The leg was wrapped in an Ace bandage and  a knee immobilizer.   The patient was then transferred to the recovery room in stable  condition.  Vania Rea. Supple, M.D.  Electronically Signed     KMS/MEDQ  D:  07/04/2006  T:  07/04/2006  Job:  [id number]

## 2010-06-20 NOTE — Assessment & Plan Note (Signed)
[facility]                            CARDIOLOGY OFFICE NOTE   NAME:[name], [name]                        MRN:          [id number]  DATE:07/31/2006                            DOB:          1945-03-26    REASON FOR PRESENTATION:  Evaluated patient with postoperative atrial  fibrillation.   HISTORY OF PRESENT ILLNESS:  I met this patient on July 09, 2006.  She  had postoperative atrial fibrillation with a rapid ventricular rate  after a left knee replacement.  She had been in the rhythm for less than  48 hours and had been on anticoagulation.  I cardioverted her x1 with  synchronized energy.  She converted to sinus rhythm and was discharged  soon thereafter.  Since going home she has had no palpitation,  presyncope or syncope.  She has had some nausea and has been weak.  She  is getting slowly better on her knee.  She is not having any chest  discomfort, neck or arm discomfort.   PAST MEDICAL HISTORY:  Coronary artery disease (left main normal, the  LAD had 50%-70% proximal stenosis, 40%-50% stenosis was mid followed by  30%-40% stenosis.  The EF was 65%-70%.), osteoarthritis, hyperlipidemia,  status post right total knee arthroplasty, status post left total knee  replacement, obstructive sleep apnea with CPAP, postoperative atrial  fibrillation, laparoscopic cholecystectomy.   ALLERGIES:  None.   MEDICATIONS:  1. Cartia 240 mg daily.  2. Crestor 10 mg daily.  3. Metoprolol 100 mg daily.  4. Coumadin.   REVIEW OF SYSTEMS:  As stated in the HPI and otherwise negative for  other systems.   PHYSICAL EXAMINATION:  The patient is in no acute distress.  Blood  pressure 43/69, heart rate 78 and regular, weight 235 pounds.  HEENT:  Eyes unremarkable, pupils equal, round and reactive to light,  fundi not visualized, oral mucosa unremarkable.  NECK:  No jugular venous distension, wave form within normal limits,  carotid upstroke brisk and symmetric, no  bruits, no thyromegaly.  LYMPHATICS:  No cervical, axillary, inguinal adenopathy.  LUNGS:  Clear to auscultation bilaterally.  BACK:  No CVA tenderness.  CHEST:  Unremarkable.  HEART:  PMI not displaced or sustained, S1 and S2 within normal limits,  no S3, no S4, no clicks, no rubs, no murmurs.  ABDOMEN:  Obese, positive bowel sounds normal in frequency and pitch, no  bruits, no rebound, no guarding, no midline pulsatile mass, no  hepatomegaly, no splenomegaly.  SKIN:  No rashes.  EXTREMITIES:  2+ pulse, no edema.   EKG sinus rhythm, left ventricular hypertrophy by voltage criteria,  chronic anterior lateral and inferior T-wave inversions unchanged from  previous.   ASSESSMENT/PLAN:  1. Atrial fibrillation.  The patient has had no paroxysms with this.      She will be on the Coumadin for 4 weeks, come Monday and she can      discontinue this.  She should continue with aspirin after this.  No      further cardiovascular testing is suggested.  2. Nausea, not sure of the etiology  of this.  I am going to try to      back off on the Cartia since this is new and she has been nauseated      since it started.  She is going to go to 120 mg a day.  I have      asked her to talk with Dr. Milinda Antis about other etiologies if she does      not get over this.  3. Hypertension.  She is probably going to need something more with      her blood pressure, so I would back off on her  Cartia.  Perhaps a      low dose diuretic.  She will keep an eye on this.  I would be happy      to follow this.  Also she will be following soon with Dr. Milinda Antis.  4. Nonobstructive coronary disease.  She should continue with primary      risk reduction.  5. Followup, to see the patient again in 4 months.     Rollene Rotunda, MD, [facility]  Electronically Signed    JH/MedQ  DD: 07/31/2006  DT: 08/01/2006  Job #: [id number]   cc:   Marne A. Milinda Antis, MD

## 2010-06-20 NOTE — Discharge Summary (Signed)
[name], [name]                 ACCOUNT NO.:  [id number]   MEDICAL RECORD NO.:  [id number]          PATIENT TYPE:  INP   LOCATION:  3742                         FACILITY:  [facility]   PHYSICIAN:  Rollene Rotunda, MD, FACCDATE OF BIRTH:  08/27/1945   DATE OF ADMISSION:  07/09/2008  DATE OF DISCHARGE:  07/12/2008                               DISCHARGE SUMMARY   PRIMARY CARE PHYSICIAN:  Marne A. Tower, MD   ALLERGIES:  This patient has allergies to CRESTOR and VIOXX.   TIME FOR THIS DICTATION:  Greater than 40 minutes.   FINAL DIAGNOSES:  1. Symptomatic atrial fibrillation.      a.     Decreased exercise tolerance, fatigue, dyspnea with moderate       exertion.      b.     Rate control with diltiazem.  2. Tikosyn loading on this admission.      a.     Direct current cardioversion, July 11, 2008.  After dose 4 of       Tikosyn converted to sinus rhythm.   SECONDARY DIAGNOSES:  1. History of atrial fibrillation.  2. Obesity.  3. Dyslipidemia.  4. Hypertension.  5. Nonobstructive coronary artery disease by catheterization.  6. Osteoarthritis.  7. Headaches.  8. Status post cholecystectomy.  9. Status post bilateral total knee arthroplasty.  10.History of duodenitis.  11.Status post appendectomy.   PROCEDURE:  July 11, 2008, direct current cardioversion to sinus rhythm.   BRIEF HISTORY:  [name] is a 65 year old female.  She saw Dr. Antoine Poche  on Jun 24, 2008.  She was complaining of decreased exercise tolerance  and fatigue.  Electrocardiogram showed atrial fibrillation.  Her  Cardizem which had been at 120 mg daily was adjusted upward to 240 mg  daily.  This has improved her rate control, however, she still has easy  fatigability.  She has been set up for Tikosyn loading and is planned  for admission today.  Currently, she has no complaints.   HOSPITAL COURSE:  The patient presents on July 09, 2008.  She began  dosing with Tikosyn after her electrolytes were adjusted and  creatinine  clearance obtained.  The patient's electrocardiogram was measured for  QTc after each dose of Tikosyn and showed no progression in the  interval.  The patient did not convert spontaneously with medication but  did convert after direct current cardioversion after dose 4.  The  patient then monitored for an additional 24-36 hours and discharging on  hospital day #4.   MEDICATIONS:  She was home on the following medications:  1. A new dose of Cardizem 120 mg daily.  2. New medication of Tikosyn 500 mcg twice daily.  3. Metoprolol 50 mg twice daily.  4. Detrol 4 mg daily.  5. Magnesium oxide 400 mg twice daily, a new medication.  6. Coumadin 2.5 mg daily.   FOLLOWUP:  She follows up at [facility], [address] [place].  1. Coumadin Clinic with electrocardiogram, Tuesday, July 20, 2008 at 9      o'clock.  2. She  sees Dr. Antoine Poche, Friday, August 13, 2008 at 3 o'clock.   LABORATORY DATA:  Lab studies this admission on the day of discharge are  basic metabolic panel with sodium 142, potassium 4.2, chloride 108,  carbonate 28, glucose 99, BUN is 18, and creatinine 1.07.  Protime on  the day of discharge 30.8.  INR 2.7.  Magnesium this admission was 2.1.      Maple Mirza, PA      Rollene Rotunda, MD, [facility]  Electronically Signed    GM/MEDQ  D:  07/12/2008  T:  07/13/2008  Job:  [id number]   cc:   Marne A. Milinda Antis, MD

## 2010-06-20 NOTE — Discharge Summary (Signed)
[name], [name]                 ACCOUNT NO.:  [id number]   MEDICAL RECORD NO.:  [id number]          PATIENT TYPE:  INP   LOCATION:  2507                         FACILITY:  [facility]   PHYSICIAN:  Rollene Rotunda, MD, FACCDATE OF BIRTH:  08/03/1945   DATE OF ADMISSION:  07/23/2008  DATE OF DISCHARGE:  07/27/2008                               DISCHARGE SUMMARY   PRIMARY CARDIOLOGIST:  Rollene Rotunda, MD, [facility]   PRIMARY CARE PHYSICIAN:  Not listed.   ELECTROPHYSIOLOGIST:  Doylene Canning. [name] Ridgel, MD   PROCEDURES PERFORMED DURING HOSPITALIZATION:  Cardiac catheterization  completed on July 26, 2008, per Dr. Charlies Constable revealing LAD 70-90%  proximal, 50% mid.  Circumflex was okay.  RCA was okay.  The stent to  the LAD had 90% which had a Liberte 2.75 x 20 mm drug-eluting stent  placed reducing it from 90% to 0%.   FINAL DISCHARGE DIAGNOSES:  1. Non-Q-wave myocardial infarction, status post non drug-eluting      stent.  2. Coronary artery disease.  A:  Status post cardiac catheterization on July 26, 2008, with 90% in-  stent stenosis of the left anterior descending with 7% proximal and 50%  mid, ejection fraction of 60%.  1. Atrial fibrillation.  A:  Status post direct current cardioversion on July 11, 2008, with  Tikosyn loading and now in normal sinus rhythm.  B:  Decreased in dosing of Tikosyn from 500 mg b.i.d. to 250 b.i.d.  secondary to prolongation of QT interval.  C:  Coumadin therapy.  1. Hypertension.  2. Hypercholesterolemia.  3. Osteoarthritis.  4. Obesity.   HOSPITAL COURSE:  This is a 65 year old female with a history of atrial  fibrillation, on Coumadin and Tikosyn, who presented to the emergency  room with substernal chest pain without radiation along with diaphoresis  and nausea and mild shortness of breath.  The patient was found to be  hypertensive with a blood pressure of 181/115 and a heart rate of 154.  The patient did take her medicine and had no change in her  heart rate or  blood pressure.  In the emergency room, the patient was found to have  EKG changes and positive troponin and therefore was admitted and placed  on heparin once her INR was less than 2.0.  Coumadin was held and the  patient was followed for further evaluation and need to have  catheterization.   The patient was followed by Dr. Rollene Rotunda over hospitalization and  the patient's EKG did reveal prolongation of QT interval on Tikosyn,  therefore her Tikosyn was decreased to 250 mg b.i.d. after being  reevaluated by EP.  They state that she will eventually need to be on  375 mg b.i.d., but will have to evaluate this carefully in future  appointments.  She remained in normal sinus rhythm and heparin was  started after low-dose vitamin K for elevated PT/INR.  The patient  remained on heparin for the duration of hospitalization to cardiac  catheterization.   On July 26, 2008, the patient did undergo cardiac catheterization as  described above.  Please  see Dr. Regino Schultze thorough cardiac  catheterization note for more details.  The patient recovered well from  cardiac catheterization and was placed on Lovenox prior to discharge for  bridging until PT/INR could be more therapeutic with reinstitution of  Coumadin.  The patient was also placed on Plavix for 30 days post  discharge.   On the day of discharge, the patient was seen and examined by Dr. Rollene Rotunda and found to be stable for discharge.  Vital signs were stable  at 143/69, heart rate 96, respirations 13, with an O2 sat of 96% on room  air with temperature of 97.9.  The patient's blood work was found to be  normal.  Her PT on discharge was 1.9.  The patient will return home with  10 doses of Lovenox to be given subcu twice a day for the next 5 days  with followup in the Coumadin Clinic within 2 days.  The patient has  been advised that her Tikosyn has been decreased to 250 mg b.i.d. as  well.   DISCHARGE  LABORATORY DATA:  Sodium 140, potassium 4.0, chloride 105, CO2  of 29, BUN 10, and creatinine 1.02.  Hemoglobin 13.8.  INR 1.1.  White  blood cells were 9.3.  Platelets were 217.  EKG on discharge revealing  sinus rhythm, ventricular rate of 61 beats per minute with ST  abnormalities, inferior and anterior laterally T-wave inversion noted.   DISCHARGE MEDICATIONS:  1. Coumadin 4 mg daily (new prescription provided).  2. Lovenox 120 mg subcutaneously every 12 hours (new prescription      provided).  3. Tikosyn 250 mg twice a day (new prescription provided).  4. Plavix 75 mg x1 month (new prescription provided).  5. Aspirin 81 mg daily.  6. Cardizem 120 mg daily.  7. Metoprolol 75 mg twice a day.  8. Potassium 20 mEq daily.  9. Pravastatin 20 mg daily.  10.Magnesium oxide 400 mg twice a day.   ALLERGIES:  CRESTOR and VIOXX.   FOLLOWUP PLANS AND APPOINTMENT:  1. The patient will follow up in the Coumadin Clinic on Friday, July 30, 2008, at 11:45 a.m. for PT/INR and need to continue Lovenox or      adjust Coumadin dose at that time.  2. The patient will follow up with Dr. Rollene Rotunda on a previously      scheduled appointment on August 23, 2008, at 3 p.m.  3. The patient has been given post-cardiac catheterization      instructions with particular emphasis on the right groin site for      evidence of bleeding, hematoma, or signs of infection.  4. The patient has been advised on a lower dose of Tikosyn, Lovenox,      and Coumadin dose prior to discharge with instructions.  5. The patient will follow up with her primary care physician on her      own accord.      Bettey Mare. Lyman Bishop, NP      Rollene Rotunda, MD, [facility]  Electronically Signed    KML/MEDQ  D:  07/27/2008  T:  07/27/2008  Job:  [id number]

## 2010-06-20 NOTE — H&P (Signed)
[name], [name]                 ACCOUNT NO.:  [id number]   MEDICAL RECORD NO.:  [id number]          PATIENT TYPE:  INP   LOCATION:  3742                         FACILITY:  [facility]   PHYSICIAN:  Thomas C. Wall, MD, FACCDATE OF BIRTH:  Dec 31, 1945   DATE OF ADMISSION:  07/09/2008  DATE OF DISCHARGE:                              HISTORY & PHYSICAL   PRIMARY CARDIOLOGY:  Rollene Rotunda, MD, [facility].   PRIMARY CARE Riot Barrick:  Idamae Schuller A. Tower, MD.   PATIENT PROFILE:  A 65 year old Caucasian female with prior history of  atrial fibrillation who recently saw Dr. Antoine Poche in the clinic and was  noted to be back in AFib and she presents today for Tikosyn loading.   PROBLEMS:  1. Atrial fibrillation.  2. Obesity.  3. Hyperlipidemia.  4. Hypertension.  5. Nonobstructive CAD by cardiac catheterization.  6. Osteoarthritis.  7. History of headaches.  8. Status post cholecystectomy.  9. Status post bilateral total knee replacements.  10.History of duodenitis.  11.Status post appendectomy.   HISTORY OF PRESENT ILLNESS:  A 65 year old Caucasian female with the  above problem list.  She saw Dr. Antoine Poche on Jun 24, 2008, and  complained of decrease exercise tolerance, fatigue, and dyspnea with  moderate exertion.  EKG showed atrial fibrillation.  Her Cardizem was  adjusted at 240 mg daily, which has by her report improved rate control;  however, she has had progressive dyspnea on exertion as well as easy  fatigability.  As such, she was set up for Tikosyn loading and is  planned for admission today.  Currently, she has no complaints.   ALLERGIES:  CRESTOR AND VIOXX.   HOME MEDICATIONS:  1. Lopressor  50 mg b.i.d.  2. Cardizem 240 mg daily.  3. Lasix 20 mg p.r.n.  4. Coumadin as directed.  5. Potassium p.r.n.  6. Detrol LA 4 mg daily.   FAMILY HISTORY:  Mother died with GI bleed at 47, father died with  aspiration pneumonia at 51.  She has an older brother who is aged 72  with a history of  CAD status post 3 stents.   SOCIAL HISTORY:  She lives in [place] with her husband.  She denies  tobacco or alcohol drug use.  She is  retired.   REVIEW OF SYSTEMS:  Positive for fatigue, dyspnea on exertion, chronic  bilateral knee and ankle edema, which is nonpitting.  Otherwise, all  systems reviewed and negative.   PHYSICAL EXAMINATION:  VITAL SIGNS:  Heart rate 78, respirations 16.  She is afebrile, blood pressure is 132/68.  GENERAL:  Pleasant white female in no acute distress.  Awake, alert, and  oriented x3.  HEENT:  Normal.  NEUROLOGIC:  Grossly intact and nonfocal.  SKIN:  Warm and dry without lesions or masses.  MUSCULOSKELETAL:  Notable for knee swelling with well-healed scars.  Otherwise, no deformity or effusion.  NECK:  Supple.  No bruits or JVD.  LUNGS:  Respirations regular and unlabored.  Clear to auscultation.  CARDIAC:  Irregularly regular.  S1 and S2.  No S3 and S4 murmurs.  ABDOMEN:  Obese, soft, nontender, nondistended.  Bowel sounds present  x4.  EXTREMITIES:  Warm, dry, and pink with trace bilateral lower extremity  edema to the knee.  Distal pulses are 2+ and equal bilaterally.   ACCESSORY CLINICAL FINDINGS:  BMET, INR, and magnesium is currently  pending.  EKG shows atrial fibrillation at a rate of 78 with a QTc  measured at 414.   ASSESSMENT AND PLAN:  Atrial fibrillation.  The patient presents with  Tikosyn loading.  She was symptomatic with her atrial fibrillation with  fatigue and dyspnea.  Currently, lab work is pending and once potassium  and magnesium levels have returned, we will replace as necessary then  plan to initiate Tikosyn.  Awaiting on the creatinine to determine the  dose.  Her QTc is 414 msec.      [name], ANP      [name] Sans. Daleen Squibb, MD, [facility]  Electronically Signed    CB/MEDQ  D:  07/09/2008  T:  07/10/2008  Job:  [id number]

## 2010-06-20 NOTE — Assessment & Plan Note (Signed)
[facility]                            CARDIOLOGY OFFICE NOTE   NAME:[name], [name] NOGUERA                        MRN:          [id number]  DATE:06/05/2007                            DOB:          April 07, 1945    PRIMARY CARE PHYSICIAN:  Marne A. Tower, MD.   REASON FOR PRESENTATION:  Evaluate patient with postoperative atrial  fibrillation.   HISTORY OF PRESENT ILLNESS:  The patient presents for followup of the  above.  I saw her last year.  She had atrial fibrillation following left  total knee arthroplasty.  She had rapid rate that was initially  controlled with Cartia.  She was treated with Coumadin.  She  subsequently underwent cardioversion times one.  She seemed to be  maintaining sinus rhythm when I saw her back in the office in June.  She  has since come off the Coumadin.   Of note, the patient reports that over the past few weeks in particular  she has had increased fatigue.  She just has not had the tolerance to do  her household chores.  It does not sound like she has been particularly  active for a while.  However, she does think she has more dyspnea with  moderate activity.  She sporadically gets chest discomfort but this is  not with activity.  This is a fleeting discomfort.  She does not  describe any severe discomfort with radiation to her neck or to her  arms.  She has no associated nausea, vomiting or diaphoresis.  This is  not in association with her shortness of breath.  She does not notice  any palpitations.  She has no presyncope or syncope.  She has sleep  apnea but does not wear a CPAP and is up very frequently during the  night to urinate.  She has noted that her heart rate has been a little  elevated.  She will take it with the blood pressure machine and it will  be in the 130's.  She has only been doing this for a couple of weeks and  so does not know whether it could have been elevated before this.  She  presents today for  followup and is noted to be in atrial fibrillation as  described below.   PAST MEDICAL HISTORY:  Coronary artery disease (left main normal, the  LAD had 50-70% proximal stenosis, there is 40-50% stenosis in the mid  vessel followed by 30-40% stenosis, the EF was 65-70%), osteoarthritis,  hyperlipidemia and status post right total knee arthroplasty, status  post left total knee replacement, obstructive sleep apnea with poor CPAP  compliance, atrial fibrillation now recurrent, laparoscopic  cholecystectomy.   ALLERGIES:  None.   MEDICATIONS:  1. Metoprolol 50 mg b.i.d.  2. Cartia 120 mg daily.  3. Crestor 10 mg daily.   SOCIAL HISTORY:  The patient does not smoke cigarettes and does not  drink alcohol.  She is married.   FAMILY HISTORY:  Is noncontributory for early coronary artery disease or  sudden cardiac death.   REVIEW OF SYSTEMS:  As stated  in the HPI and negative for other systems.   PHYSICAL EXAMINATION:  GENERAL:  The patient is not in any distress.  VITAL SIGNS:  Blood pressure 140/80, heart rate 97 and irregular, weight  249 pounds, body mass index 41.  HEENT:  Eyelids unremarkable.  Pupils equal, round and reactive.  Fundi  not visualized.  Oral mucosa unremarkable.  NECK:  No jugular venous distention at 45 degrees, carotid upstroke  brisk and symmetric, no bruits, no thyromegaly.  LYMPHATICS:  No cervical, axillary or inguinal adenopathy.  LUNGS:  Clear to auscultation bilaterally.  BACK:  No costovertebral angle tenderness.  CHEST:  Unremarkable.  HEART:  PMI not displaced or sustained, S1-S2 within normal limits.  No  S3, no murmurs.  ABDOMEN:  Obese, positive bowel sounds, normal in frequency and pitch,  no bruits, rebound, guarding or midline pulsatile mass, hepatomegaly,  splenomegaly.  SKIN:  No rashes, no nodules.  EXTREMITIES:  2+ pulses throughout, no edema, cyanosis, clubbing.  NEUROLOGICAL:  Oriented to person, place, time.  Cranial nerves II-XII   grossly intact.  Motor grossly intact.   EKG - atrial fibrillation with somewhat rapid ventricular response, rate  100, axis within normal limits, intervals within normal limits, lateral  T-wave inversions, inferior T-wave inversions not changed from previous.   ASSESSMENT AND PLAN:  1. Atrial fibrillation.  The patient has atrial fibrillation and may      be having symptoms related to this.  She is more fatigued.  At this      point I think we should try normal sinus rhythm.  I think that she      needs a pharmacologic agent at this point and wound need Tikosyn.      I am going to anticoagulate her for 3-4 weeks until she is      therapeutic for this long.  She will then be admitted to the      hospital and started on Tikosyn.  I will get labs today to include      magnesium, BMET and a TSH.  She will have a CBC.  She will get a PT      and her first dose of Coumadin and will be set up in the Coumadin      clinic.  She understands the plan and agrees to proceed with this.  2. Coronary artery disease.  She is not having any symptoms related to      this.  She will continue with risk reduction.  No further      cardiovascular testing is suggested.  3. Hypertension.  Blood pressure is slightly elevated.  We will manage      this in the context of treating her rate with her atrial      fibrillation.  4. Obesity.  She understands the need to lose weight with diet and      exercise.  5. Dyslipidemia per Dr. Milinda Antis.  The gold should be an LDL less than      100 and HDL greater      than 50.  6. Followup.  I will see her back in at the time of her      hospitalization for Tikosyn.     Rollene Rotunda, MD, [facility]  Electronically Signed    JH/MedQ  DD: 06/05/2007  DT: 06/05/2007  Job #: [id number]   cc:   Marne A. Milinda Antis, MD

## 2010-06-20 NOTE — H&P (Signed)
NAMELESHAWN, HOUSEWORTH                 ACCOUNT NO.:  [id number]   MEDICAL RECORD NO.:  [id number]          PATIENT TYPE:  INP   LOCATION:  2928                         FACILITY:  [facility]   PHYSICIAN:  Unice Cobble, MD     DATE OF BIRTH:  Jun 12, 1945   DATE OF ADMISSION:  07/23/2008  DATE OF DISCHARGE:                              HISTORY & PHYSICAL   CARDIOLOGIST:  Dr. Antoine Poche.   CHIEF COMPLAINT:  Chest pain.   HISTORY OF PRESENT ILLNESS:  This is a 65 year old white female with a  history of atrial fibrillation on Coumadin/Tikosyn, hyperlipidemia,  hypertension who presented with chest pain.  The patient tells me her  chest pain began at 8:15 p.m.  It was substernal chest pain without  radiation.  She was diaphoretic and nauseous with mild shortness of  breath.  Also associated with mild lightheadedness.  She took her blood  pressure and it was 181/115 with a heart rate of 154.  She took her  Tikosyn and metoprolol early this evening and did not have any favorable  results and, therefore, came to the emergency department.  In the ED,  she was found to have ECG changes and a positive troponin.  She tells me  that her chronic lower extremity edema is unchanged.   PAST MEDICAL HISTORY:  1. Atrial fibrillation, status post DC cardioversion x3.  2. Obesity.  3. Hyperlipidemia.  4. Hypertension.  5. Nonobstructive coronary artery disease by catheterization in 2007.  6. Osteoarthritis.  7. History of headaches.  8. Status post cholecystectomy.  9. Status post bilateral total knee replacements.  10.History of duodenitis.  11.Status post appendectomy.   ALLERGIES:  1. CRESTOR.  2. VIOXX.   MEDICATIONS:  1. Cardizem 120 mg daily.  2. Tikosyn 500 mg b.i.d. at 9:00 p.m. and 9:00 a.m.  3. Metoprolol 50 mg b.i.d.  4. Detrol 4 mg daily.  5. Magnesium oxide 400 mg b.i.d.  6. Coumadin 2.5 mg daily.   SOCIAL HISTORY:  She lives in [place] with her husband.  She is retired.  She does not  smoke, drink or use drugs.   FAMILY HISTORY:  Mother died with GI bleed at the age of 62.  Her father  died of pneumonia at the age of 62.  She has a brother who is older with  coronary artery disease and stents.   REVIEW OF SYSTEMS:  A complete review of systems was done and found to  be otherwise negative except as stated in the HPI.   PHYSICAL EXAMINATION:  VITAL SIGNS:  Temperature 98.1, pulse 73,  respiratory rate 20, blood pressure 144/71.  O2 saturations 97% on 2  liters.  GENERAL:  She is obese in no acute distress.  Chest pain free.  HEENT: Shows PERRLA, EOMI, MMM, oropharynx without erythema or exudate.  NECK:  Supple without lymphadenopathy, thyromegaly, bruits or  jugulovenous distention.  HEART:  Regular rate and rhythm with a normal S1-S2, without murmurs,  gallops or rubs.  LUNGS:  Clear to auscultation bilaterally.  ABDOMEN:  Soft and nontender with normal bowel sounds.  No rebound or  guarding.  EXTREMITIES:  Show no cyanosis or clubbing.  She had trace  edema bilaterally in her lower extremities.  MUSCULOSKELETAL:  Shows no joint deformity or effusions.  No spine or  CVA tenderness.  NEUROLOGICAL:  She is alert and oriented x3 with  cranial nerves II-XII grossly intact with strength 5/5 in all  extremities and axial groups with normal sensation throughout.   EKG shows a rate of 73 in normal sinus rhythm.  She has inferolateral ST  depression of approximately 1 mm with T-wave inversions worse than her  prior EKGs.   LABORATORY DATA:  Show a normal white count and a normal platelet count  with a hemoglobin of 13.9.  Her creatinine is 0.9 with a blood sugar of  145.  Her INR is 2.4 and her troponin is 0.44 rising to 1.59 on second  check.   ASSESSMENT/PLAN:  This is a 65 year old white female with a history of  hyperlipidemia, hypertension and atrial fibrillation on Tikosyn who  presents with chest pain and non-ST elevation myocardial infarction.  It  is  uncertain whether this is a type 2 myocardial infarction from atrial  fibrillation with rapid ventricular response, an hypertensive emergency  or true non-ST elevation myocardial infarction.  EKG changes favor the  later and she should receive a catheterization to examine her  coronaries.  She is currently on Coumadin and this will be allowed to  drift down overnight and heparin will be started when it falls less than  2.  Aspirin will be started for platelet inhibition.  I will increase  her beta-blocker for better heart rate control.  Otherwise, I will  continue on her heart regimen as prescribed.  I will check a lipid panel  and appropriate medication will be started tomorrow.      Unice Cobble, MD  Electronically Signed     ACJ/MEDQ  D:  07/23/2008  T:  07/23/2008  Job:  [id number]

## 2010-06-20 NOTE — Op Note (Signed)
NAMEALAJIAH, DUTKIEWICZ                 ACCOUNT NO.:  [id number]   MEDICAL RECORD NO.:  [id number]          PATIENT TYPE:  INP   LOCATION:  1422                         [facility]ACILITY[facility]  [facility]   PHYSICIAN:  Rollene Rotunda, MD, FACCDATE OF BIRTH:  30-Aug-1945   DATE OF PROCEDURE:  07/09/2006  DATE OF DISCHARGE:                               OPERATIVE REPORT   PROCEDURE:  Direct current cardioversion.   INDICATION:  Atrial fibrillation with rapid ventricular rate.   PROCEDURE NOTE:  The patient signed appropriate informed consent.  She  was monitored in the PACU hemodynamically and with airway per  Anesthesia.  She had appropriate anticoagulation since the onset of her  atrial fibrillation.  She was sedated per anesthesia with 80 mg of  propofol.  She underwent direct current cardioversion times one with 150  joules of biphasic energy.  This resulted in normal sinus rhythm.  There  were no apparent complications.  EKG is pending.  I will reduce her  Toprol to 100 mg daily and Cardizem to 240 mg daily.  She will be  monitored on telemetry and will continue her Coumadin which is currently  therapeutic at an INR 2.6.      Rollene Rotunda, MD, [facility]  Electronically Signed     JH/MEDQ  D:  07/09/2006  T:  07/09/2006  Job:  [id number]   cc:   Vania Rea. Supple, M.D.  Fax: [phone]

## 2010-06-23 NOTE — Op Note (Signed)
[facility]  Patient:    [name], [name] Visit Number: [id number] MRN: [id number]          Service Type: SUR Location: 4W [id number] Attending Physician:  Andre Lefort Dictated by:   Sandria Bales. Ezzard Standing, M.D. Proc. Date: 07/31/01 Admit Date:  07/31/2001 Discharge Date: 08/01/2001   CC:         Vania Rea. Jarold Motto, M.D. [facility]  Claretta Fraise, M.D.   Operative Report  DATE OF BIRTH:  [birth year]  PREOPERATIVE DIAGNOSIS:  Symptomatic cholelithiasis.  POSTOPERATIVE DIAGNOSIS:  Symptomatic cholelithiasis.  PROCEDURE:  Laparoscopic cholecystectomy with intraoperative cholangiogram.  SURGEON:  Sandria Bales. Ezzard Standing, M.D.  FIRST ASSISTANT:  Anselm Pancoast. Zachery Dakins, M.D.  ANESTHESIA:  General endotracheal.  ESTIMATED BLOOD LOSS:  Minimal.  INDICATIONS FOR PROCEDURE:  Ms. Bartel is a 65 year old white female who has had some vague chest and abdominal complaints. She underwent a cardiac evaluation for an inverted T wave and had a negative Cardiolite scan. She saw Dr. Sheryn Bison who obtained an ultrasound which showed multiple gallstones and normal common bile duct. She has a family history of pancreatitis secondary to gallstones and therefore was anxious about proceeding with laparoscopic cholecystectomy.  OPERATIVE NOTE:  The patient was placed in a supine position, given a general endotracheal anesthetic supervised by Dr. Lucille Passy. She was given 1 g of Ancef at the initiation of the procedure, had PAS stockings and orogastric tube placed.  Her abdomen was prepped with Betadine solution and sterilely draped.  An infraumbilical incision was made with sharp dissection and carried down to the abdominal cavity. A 0-degree laparoscope was inserted through a 12 mm Hasson trocar. The Hasson trocar was secured with a #0 Vicryl suture. Abdominal exploration carried out and revealed right and left lobes of the liver unremarkable, stomach  unremarkable. She had a band of adhesions in her right transverse abdomen that I had to kind of slide the scope around. There was no other mass or lesion within her abdominal cavity. She had three identical trocars placed at 10 mm subxiphoid port, a 5 mm right mid subcostal port and a 5 mm lateral subcostal port. The gallbladder was identified, grasped and rotated cephalad. Sharp dissection was carried around the gallbladder infundibulum identifying the cystic duct.  The cystic duct was isolated and a clip placed on the gallbladder side of the cystic duct.  The intraoperative cholangiogram was obtained using a cut-off taut catheter inserted through a 14 gauge Jelco catheter. The catheter was then inserted into the side of cut cystic duct and secured with an endoclip.  An intraoperative cholangiogram was used injecting about 10 cc of 1/2 strength Hypaque solution and this showed free flow of contrast down the cystic duct into the common bile duct into the duodenum. There was no obstruction, no mass and no lesions. This was felt to be a normal intraoperative cholangiogram.  The cystic duct was then triply endoclipped and divided, the cystic artery which lay behind the cystic duct was triply endoclipped and divided. The gallbladder with sharply and bluntly dissected from the gallbladder bed using primarily hook Bovie coagulation. Prior to complete division of the gallbladder from the gallbladder bed, the gallbladder bed was visualized as was the triangle of Calot. This was irrigated. The gallbladder was then divided and delivered in an endocatch bag and delivered through the umbilicus and sent to pathology.  The abdomen was irrigated with about 500 cc of saline and there was no bleeding, no  bile leak. Each trocar was removed in turn. The umbilical port was closed with a #0 Vicryl suture. I had taken down some of the adhesions and these were not bleeding on the exam at the end of the  procedure. Each skin incision was closed with a 5-0 Vicryl suture, painted with tinctured Benzoin and Steri-Strips and sterilely dressed. Dictated by:   Sandria Bales. Ezzard Standing, M.D. Attending Physician:  Andre Lefort DD:  07/31/01 TD:  08/01/01 Job: [id number]

## 2010-06-23 NOTE — Op Note (Signed)
NAME:  [name], [name]                          ACCOUNT NO.:  [id number]   MEDICAL RECORD NO.:  [id number]                   PATIENT TYPE:  AMB   LOCATION:  DAY                                  FACILITY:  [facility]   PHYSICIAN:  Vania Rea. Supple, M.D.               DATE OF BIRTH:  05-24-1945   DATE OF PROCEDURE:  06/30/2002  DATE OF DISCHARGE:                                 OPERATIVE REPORT   PREOPERATIVE DIAGNOSES:  1. Right knee osteoarthrosis.  2. Right knee medial meniscus tear.   POSTOPERATIVE DIAGNOSES:  1. Right knee extensor synovitis.  2. Right knee osteoarthrosis.  3. 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  4. Degenerative tears of the medial and lateral menisci.   PROCEDURES:  1. Right knee diagnostic arthroscopy.  2. Right knee extensive synovectomy.  3. Chondroplasty of all three compartments.  4. Partial medial and lateral meniscectomies.   SURGEON:  Vania Rea. Supple, M.D.   Threasa HeadsFrench Ana A. Shuford, P.A.-C.   ANESTHESIA:  Spinal.   ESTIMATED BLOOD LOSS:  Minimal.   TOURNIQUET TIME:  None was used.   HISTORY OF PRESENT ILLNESS:  The patient is a 65 year old female with known  bilateral knee arthrosis who has previously undergone a left knee  arthroscopy with good symptomatic relief.  Her right knee has become  increasingly painful with swelling and mechanical symptoms.  Examination  shows tenderness along the joint lines, primarily medially.  Her plain films  do show medial compartment narrowing and peripheral osteophyte formation.  Due to ongoing pain at the time of consultation, she is brought to the  operating room at this time for right knee arthroscopy.   Preoperatively, the patient was counseled of treatment options, as well as  risks related to surgery.  Possible surgical complications include infection  ________, deep vein thrombosis, pulmonary embolus, as well as persistent  pain were reviewed.  She understands  and accepts and agrees with planned  procedure.   DESCRIPTION OF PROCEDURE:  After undergoing routine preoperative evaluation,  the patient received prophylactic antibiotics.  She was brought to the  operating room, and on the operating table had a spinal anesthetic placed.  In the supine position after adequate anesthesia, the right lower extremity  was then placed in the leg holder and sterilely prepped and draped in  standard fashion.  Standard portals were established and diagnostic  arthroscopy was performed.  We found that the entire knee was completely  enclosed with proliferative hemorrhagic inflamed synovial tissue.  There was  essentially no joint space to be found in either the gutter, the  suprapatellar pouch, or the anterior chamber.  It had the appearance of  arthrofibrosis due to the extreme debris of intra-articular adhesions.  Through the two anterior portals, one had began a synovectomy and opened up  the anterior chamber, and then progressed into the suprapatellar pouch, and  then cleaned  both the gutters and suprapatellar pouch to allow  visualization.  We found diffuse grade 3 chondromalacia involving the  patellofemoral joint, as well as the medial femoral condyle, and diffuse  grade 2 chondromalacia in the lateral compartment.  Extensive  chondroplasties were performed in all three compartments, trimming the  degenerative cartilage down to a stable base.  She was found to have small  degenerative tears of the mid _______ medial and lateral menisci, and these  were reamed back to a stable margin with a shaver.  After completion of the  synovectomies and chondroplasties, re-inspection of the joint showed no  obvious additional pathology.  She did have evidence for fairly advanced  patellofemoral arthrosis, as well as involvement of the medial compartment.  At this point in time, inspection and irrigation was performed.  Fluid and  meniscus were removed.  A combination  of Marcaine, morphine, and epi was  instilled into the joint.  Additional Marcaine with epi was instilled in the  portals.  The portals were closed with Steri-Strips.  A bulky dry dressing  was wrapped around the knee, and then the leg was wrapped up at the thigh  with an Ace bandage.  The patient was then transferred to the recovery room  in stable condition.                                               Vania Rea. Supple, M.D.    KMS/MEDQ  D:  06/30/2002  T:  06/30/2002  Job:  [id number]

## 2010-06-23 NOTE — Assessment & Plan Note (Signed)
[facility]                             PULMONARY OFFICE NOTE   NAME:SHOEShawntay, Prest                        MRN:          [id number]  DATE:01/02/2006                            DOB:          [birth year]    I received a call from Ms. Challis stating that she was having difficulty  tolerating her CPAP machine.   She uses the ramping button initially, and then is able to fall asleep  with the machine.  She sleeps for 1 to 2 hours, but then wakes up  feeling like she is getting too much pressure, and she says that a weird  noise is coming from the sides of the mask and she feels like the mask  is just going to blow off her face.  As a result, she has been able to  use the machine only on a limited basis.  She does not appear to be  having any other difficulty as far as nasal congestion or dryness or  nosebleeds.  She says she has some dryness in her mouth, but is using a  humidifier which seems to help.   I have reviewed her hypnogram from her split-night study, and it appears  that we may be able to decrease the pressure to 10, and then see if she  is able to tolerate this better.  After that I would reassess her to see  if we can leave her at a pressure setting of 10 or if she needs to have  further adjustments in her pressure settings.     Coralyn Helling, MD  Electronically Signed    VS/MedQ  DD: 01/02/2006  DT: 01/03/2006  Job #: [id number]

## 2010-06-23 NOTE — Assessment & Plan Note (Signed)
[facility]                               PULMONARY OFFICE NOTE   NAME:SHOEOlean, [name]                        MRN:          [id number]  DATE:12/21/2005                            DOB:          Sep 05, 1945    REFERRING PHYSICIAN:  Marne A. Tower, MD   SLEEP CONSULTATION:  I had the pleasure of meeting [name] today for evaluation of her  obstructive sleep apnea.  She had undergone an overnight polysomnogram on  November 15, 2005.  This was actually a split night study.  During the  diagnostic portion of the study, she was found to have severe obstructive  sleep apnea with an apnea/hypopnea index of 49 and an oxygen saturation of  79%.  During the therapeutic portion of the study, she was titrated to a C-  PAP pressure setting of 15 cm of water and, at this pressure setting, she  was observed in REM sleep, but did not have supine sleep and her oxygenation  appeared to have stabilized.   She was initially referred for a sleep test, after she had undergone a right  total knee replacement in March of 2007, and the anesthesiologist had  mentioned concern of possibly having sleep apnea, in addition to the  anesthesiologist's making a comment that she had a small airway.   Her current sleep pattern is that she goes to bed around 10 or 11 o'clock at  night.  She falls asleep reasonably quickly, but then she wakes up two to  three times during the course of the night to use the bathroom.  She will  wake up in the morning at about 4:30, but still feels quite tired and  occasionally will have a headache.  Her Epworth score today is 4.  However,  she does fall asleep fairly easily if she is sitting quietly, watching TV.  She has been told that she snores at night and she will tend to get a dry  mouth, as well as breathe through her mouth at night and she believes she  also grinds her teeth at night, as well.  She denies any history of  nightmares, night  terrors, sleep-walking or sleep-talking.  There is no  history to suggest a restless-leg syndrome.  She also denies any history of  sleep hallucinations, sleep paralysis, cataplexy.  She is not currently  using anything to help her fall asleep at night or stay awake during the  day.   PAST MEDICAL HISTORY:  Significant for hypertension, elevated cholesterol,  chronic headaches.  She was PPD positive in 1975.   PAST SURGICAL HISTORY:  Significant for cholecystectomy, tubal ligation,  appendectomy, and bilateral knee repair, as well as a hysterectomy.   CURRENT MEDICATIONS:  1. Celebrex 200 mg daily.  2. Atenolol 25 mg daily.  3. Crestor 10 mg daily.  4. Vesicare 5 mg daily.  5. Omega 3 1200 mg daily.  6. Hydrochlorothiazide 25 mg three times per week.   ALLERGIES:  She has no known drug allergies.   FAMILY HISTORY:  Significant for her grandmother with  heart disease and  grandfather with cancer.   SOCIAL HISTORY:  She is married.  She works at [employer].  She  has two children.  There is no history of tobacco or alcohol use.   REVIEW OF SYSTEMS:  She has gained approximately 35 pounds over the last one  to two years.   PHYSICAL EXAMINATION:  She is 5 feet 5 inches tall, 245 pounds.  Temperature  is 98, blood pressure is 114/78, heart rate is 81, oxygen saturation is 91%  on room air.  HEENT:  Pupils are reactive.  Extraocular muscles are intact.  There is no  sinus tenderness, no nasal discharge.  She has Mallampati 4 airway.  NECK:  There was no lymphadenopathy, no thyromegaly.  HEART:  S1, S2, regular rhythm.  CHEST:  Clear to auscultation.  ABDOMEN:  Obese, soft, nontender.  EXTREMITIES:  There was no edema, cyanosis or clubbing.  NEUROLOGIC EXAM:  No focal deficits were appreciated.   IMPRESSION:  Severe obstructive sleep apnea, as demonstrated by an  apnea/hypopnea index of 49 and oxygen saturation of 79%.  I have reviewed  the results of her sleep study  with her.  I discussed the adverse  consequences of untreated sleep apnea, including hypertension, coronary  artery disease, cerebrovascular disease and diabetes.  I discussed with her  the importance of diet, exercise and weight reduction, as well as avoidance  of alcohol and sedatives.  Driving precautions were discussed with her, as  well.  I have reviewed various treatment options for her sleep apnea,  including C-PAP therapy, oral appliance, and surgical intervention.  Given  the severity of her sleep apnea, I have recommended that she be initiated on  C-PAP therapy and she is agreeable to this.  Therefore, we will make  arrangements for her to be established with a home care company and  initiated on C-PAP of 15 cm of water with heated humidification and mask of  her choice.  Then I will follow up with her in approximately six to eight  weeks to assess her tolerance and compliance to C-PAP therapy.     Coralyn Helling, MD  Electronically Signed    VS/MedQ  DD: 12/26/2005  DT: 12/26/2005  Job #: [id number]   cc:   [name] A. Milinda Antis, MD

## 2010-06-23 NOTE — Op Note (Signed)
[name], [name]                 ACCOUNT NO.:  [id number]   MEDICAL RECORD NO.:  [id number]          PATIENT TYPE:  INP   LOCATION:  X003                         FACILITY:  [facility]   PHYSICIAN:  Vania Rea. Supple, M.D.  DATE OF BIRTH:  10-04-1945   DATE OF PROCEDURE:  04/19/2005  DATE OF DISCHARGE:                                 OPERATIVE REPORT   PREOPERATIVE DIAGNOSIS:  End-stage bilateral knee osteoarthritis.   POSTOPERATIVE DIAGNOSIS:  End-stage bilateral knee osteoarthritis.   PROCEDURE:  1.  Injection of left knee with cortical steroid preparation.  2.  Right cemented total knee arthroplasty utilizing a DePuy Sigma PFC      system size 4 femur, a size 4 tibia, a 35 mm patellar button, and a 12.5      thick rotating platform polyethylene insert.   SURGEON OF RECORD:  Dr. Caryn Bee Supple   ASSISTANT:  Ralene Bathe, PA-C.   ANESTHESIA:  Spinal.   TOURNIQUET TIME:  Approximately 1 hour and 15 minutes.   ESTIMATED BLOOD LOSS:  200 mL.   DRAINS:  Hemovac x1.   HISTORY:  [name] is a 65 year old female who has had chronic bilateral  knee pain with radiographic evidence for primarily medial compartment  arthrosis.  Her symptoms have progressed despite prolonged attempts at  conservative management.  Due to her ongoing pain and functional  limitations, she is brought to the operating room at this time for planned  total knee arthroplasty as well as injection of the left knee with cortical  steroid preparation.   Preoperatively, we counseled [name] on treatment options as well as risks  versus benefits thereof.  Possible surgical complications of bleeding,  infection, neurovascular injury, DVT, PE, persistent pain, loss of motion,  failure of the implant, the possible need for revision surgery were all  reviewed.  She understands and accepts and agrees with our planned  procedure.   PROCEDURE IN DETAIL:  After undergoing routine preop evaluation, the patient  received  prophylactic antibiotics.  Brought to the operating room, placed  supine on the operating table and in a sitting position had a spinal  anesthetic placed.  She was then placed supine.  Foley catheter placed.  The  left knee was sterilely prepped, and a combination of Xylocaine and Depo-  Medrol 80 mg was instilled into the left knee.  A tourniquet was then  applied to the right side, and the right leg was sterilely prepped and  draped in standard fashion.  The leg was exsanguinated, tourniquet inflated  to 350 mmHg.  An anterior midline incision was then made from 5  fingerbreadths above the patella to just medial to the tibial tubercle to a  length of approximately centimeters.  Skin flaps were elevated medially and  laterally and then sutured back.  Electrocautery was used for hemostasis.  A  medial parapatellar arthrotomy was then made.  Fat pad was excised, and the  patella was everted.  Knee was then flexed up.  Remnants of the menisci and  cruciate ligaments were excised.  A drill was then used to  gain access to  the femoral medullary canal, and an intramedullary guide was then placed.  A  12 mm resection was then made at a 5-degree valgus angle on the distal  femur.  The distal femur was then sized, and the size 4 had the best fit.  The size 4 cutting guide was then applied, and the distal femoral cuts were  then made with an oscillating saw.  Trial size 4 implant showed excellent  fit.  Attention then directed to the proximal tibia and using an  extramedullary guide, we made a 10 mm resection at a neutral posterior  slope, resecting 10 mm of bone measured from the lateral plateau.  The  proximal tibia was then sized, and the size 4 had the best coverage.  The  size 4 tibial tray was pinned into position, and a trial reduction was  performed.  Good position and soft tissue balance and stability was  achieved.  We then completed the tibial preparation using an intramedullary  drill  and then broached for the tibial keel.  Attention then redirected to  the distal femur where the box cutting guide was applied, and a saw was then  used to make the box cut.  The posterior aspect of the knee was then  inspected, and residual soft tissue and bony debris was removed, and the  osteotome was then used to resect residual bone spurs on the posterior  aspect of the femoral condyles.  Attention then directed to the patella, and  this was then measured, and the size 35 mm patellar button had the best  coverage.  An oscillating saw was then used to resect the osteoarticular  surface, removing 8.5 mm of bone.  The stabilizing drill holes were then  made with a drill.  Trial reduction showed good fit of the patellar button.  At this point, the joint was then pulsatilely lavaged and meticulously  cleaned.  Cement was then mixed on the back table, and the implants were  cemented into position beginning with the tibia, then the femur, and then  the patella, and care was taken to meticulously remove all extra cement.  Once the cement had hardened, the knee was taken through a range of motion.  Good stability was achieved.  Excellent soft tissue balance. Full extension.  Of note, she did have a preoperative 25-degree flexion contracture and we  were done, the knee easily went into full extension.  The final tibial tray  was then placed, and there was a 10 and 12.5 trials.  The 12.5 had the best  soft tissue balance.  The final 12.5 rotating platform insert was then  placed.  Final reduction confirmed excellent soft tissue balance at full  extension.  At this point, the tourniquet was then let down.  Hemostasis was  obtained.  A Hemovac drain brought out superolaterally.  The parapatellar  arthrotomy was closed with a series of figure-of-eight #1 Vicryl sutures.  Then 2-0 Vicryl used for the deep and superficial subcu and intracuticular Monocryl used for the skin followed by Steri-Strips.  A  bulky dry dressing  was then applied to the right knee.  The leg was wrapped in Ace bandage.  Knee immobilizer applied.  The patient was then transferred to the recovery  room in stable condition.      Vania Rea. Supple, M.D.  Electronically Signed     KMS/MEDQ  D:  04/19/2005  T:  04/20/2005  Job:  [id number]

## 2010-06-23 NOTE — Procedures (Signed)
NAMEBRYLEY, [name]                 ACCOUNT NO.:  [id number]   MEDICAL RECORD NO.:  [id number]          PATIENT TYPE:  OUT   LOCATION:  SLEEP CENTER                 FACILITY:  [facility]   PHYSICIAN:  Barbaraann Share, MD,FCCPDATE OF BIRTH:  04[birth year]1943   DATE OF STUDY:  11/15/2005                              NOCTURNAL POLYSOMNOGRAM   REFERRING PHYSICIAN:  Dr. Arta Silence   INDICATION FOR STUDY:  Hypersomia with sleep apnea.   EPWORTH SLEEPINESS SCORE:  5   SLEEP ARCHITECTURE:  The patient had a total sleep time of 358 minutes with  decreased slow wave sleep as well as REM.  Sleep onset latency was normal as  was REM onset.  Sleep efficiency was decreased at 80%.   RESPIRATORY DATA:  The patient underwent split night study where she was  found to have 128 obstructive events in the first 158 minutes of sleep.  This gave her a respiratory disturbance index of 49 events per hour over the  first half of the night.  The events were not positional, but there was loud  snoring noted throughout.  By protocol, the patient was then placed on a  medium Respironics Comfort Full Face Mask and ultimately titrated to 15 cm  of pressure with excellent control.   OXYGEN DATA:  There is O2 desaturation as low as 79% with the patient's  obstructive events.   CARDIAC DATA:  No clinically significant cardiac arrhythmia.   MOVEMENT-PARASOMNIA:  Small numbers of leg jerks with no clinically  significant sleep disruption.   IMPRESSIONS-RECOMMENDATIONS:  Severe obstructive sleep apnea/hypopnea  syndrome with a respiratory disturbance index of 49 events per hour during  the first half of the night and O2 desaturation as low as 79%.  The patient  was then placed on a medium Respironics Comfort Full Face Mask and  ultimately titrated to 15 cmH20 pressure with good control of her events.  The patient should also be encouraged to work on aggressive weight loss.            ______________________________  Barbaraann Share, MD,FCCP  Diplomate, American Board of Sleep  Medicine     KMC/MEDQ  D:  11/29/2005 16:34:27  T:  11/30/2005 20:11:03  Job:  [id number]

## 2010-06-23 NOTE — Discharge Summary (Signed)
[name], [name]                 ACCOUNT NO.:  [id number]   MEDICAL RECORD NO.:  [id number]          PATIENT TYPE:  INP   LOCATION:  1513                         [facility]ACILITY:  [facility]   PHYSICIAN:  Vania Rea. Supple, M.D.  DATE OF BIRTH:  [birth year]   DATE OF ADMISSION:  04/19/2005  DATE OF DISCHARGE:  04/22/2005                                 DISCHARGE SUMMARY   ADMISSION DIAGNOSES:  1.  End-stage osteoarthrosis of the right knee.  2.  Generalized osteoarthrosis.  3.  Mild peripheral edema and mild hypertension.   DISCHARGE DIAGNOSES:  1.  End-stage osteoarthrosis of the right knee.  2.  Generalized osteoarthrosis.  3.  Mild peripheral edema and mild hypertension.  4.  Status post right total knee arthroplasty.  5.  Injection of left corticosteroids.  6.  Probable undiagnosed sleep apnea.   OPERATIONS:  Right total knee arthroplasty and injection of left knee.   SURGEON:  Vania Rea. Supple, M.D.   ASSESSMENT:  [name], P.A.-C.   ANESTHESIA:  General.   BRIEF HISTORY:  Mrs.  [name] is a pleasant 65 year old female, well-known to  [place] who has been cared for outpatient for end-stage osteoarthritis of her  knee.  She has failed outpatient conservative measures and x-ray  demonstrated bone-on-bone deformity.  Due to her function limitations and  significant pain, total knee arthroplasty was indicated.  At this time, she  wishes to proceed.   HOSPITAL COURSE:  The patient was admitted and underwent the above noted  procedure and tolerated this well.  All appropriate IV antibiotics,  analgesics were utilized.  She was placed on routine postoperative protocol  with weightbearing as tolerated, CPM unit as well as Hemovac placed  intraoperatively.  It pulled out through the night.  She did exhibit signs  of sleep apnea during her surgery.  CPAP was ordered by respiratory and used  while she was inpatient.  Workup as an outpatient was recommended.  She  began working with physical  therapy.  She did extremely well.  She was on  Coumadin for DVT and PE prophylaxis.  By postoperative day #3, she met all  of her therapy goals.  She was afebrile.  Vital signs were stable.  Her  hemoglobin was 10.2 and stable.  Home Health was arranged and she was ready  for discharge to home to follow up on an outpatient basis.   LABORATORY DATA:  Admission hemogram with hemoglobin of 12.9,  postoperatively down to 11.3, 10.6 and 10.2 and stable.  Pro time is  followed by pharmacy on Coumadin for DVT and PE prophylaxis.  Chemistries on  admission within normal limits.  Mild postoperatively hyponatremia at 132 on  postoperative day #2.  Urinalysis was negative on admission.   X-RAYS:  Chest x-ray showed no active disease on April 16, 2005.   CONDITION ON DISCHARGE:  Stable and improved.   DISCHARGE MEDICATIONS/PLAN:  The patient is being discharged to home.  Home  Health PT and RN's are ordered.  We have recommended follow up with her  primary medical doctor for consideration  of a sleep study.  She is on the  following medications.  1.  Coumadin per pharmacy protocol.  2.  Percocet 1-2 q.4-6 h p.r.n. pain.  3.  Robaxin 500 mg q.8 h p.r.n. spasms.   FOLLOWUP:  Follow up in two weeks postoperatively, call for time.  Resume  home medications and diet.  Call for any further questions.  May shower on  postoperative day #5.      [name], P.A.-C.      Vania Rea. Supple, M.D.  Electronically Signed    TAS/MEDQ  D:  05/17/2005  T:  05/17/2005  Job:  [id number]

## 2010-06-23 NOTE — Cardiovascular Report (Signed)
NAMEMODEST, DRAEGER                 ACCOUNT NO.:  [id number]   MEDICAL RECORD NO.:  [id number]          PATIENT TYPE:  OIB   LOCATION:  6501                         FACILITY:  [facility]   PHYSICIAN:  Learta Codding, M.D. LHCDATE OF BIRTH:  1945/08/08   DATE OF PROCEDURE:  10/10/2004  DATE OF DISCHARGE:  09/25/2004                              CARDIAC CATHETERIZATION   INDICATIONS FOR PROCEDURE:  The patient is a 65 year old female with no  prior history of coronary artery disease.  The patient reports atypical  chest pain and is scheduled for knee surgery.   RECOMMENDATIONS:  We are going to proceed with cardiac catheterization.   PROCEDURE PERFORMED:  Left heart catheterization and ventriculography.   DIAGNOSIS:  1.  Moderate nonobstructive coronary artery disease of the left anterior      descending.  2.  Normal left ventricular systolic function.   DESCRIPTION OF PROCEDURE:  After informed consent was obtained, the patient  was brought to the catheterization laboratory.  A 4 French arterial sheath  was placed using modified Seldinger technique.  Standard JL4 and JR4  catheters were used for diagnostic imaging.  Angiographic images were  obtained in various projections using manual injection of contrast.  Ventriculography was performed with a 4 French pigtail catheter.  At the  termination of the procedure, all catheters and sheaths were removed and no  complications encountered.   FINDINGS:  1.  Left main coronary artery was a large caliber vessel with no evidence of      flow limiting disease.  2.  Left anterior descending artery had a lesion in the proximal LAD just      prior to the first septal perforator which is approximately 50-70%.      Subsequently, after the first diagonal, there was a 40-50% stenosis and      following the second diagonal, there was a moderate 30-40% stenosis.      The first diagonal branch was a large caliber vessel with no evidence of      flow  limiting disease.  The second diagonal branch also appeared to be      within normal limits.  3.  The circumflex coronary artery was a large caliber vessel with no      evidence of significant flow limiting disease.  There was a first large      obtuse marginal branch and a smaller second obtuse marginal branch, both      without any significant lesions.  4.  The right coronary artery was a dominant vessel.  This gave rise to a      very large posterior descending artery and two small posterolateral      branches.  There was no evidence of flow limiting disease in the right      coronary artery.  5.  Ventriculography:  Ejection fraction was 65-70%.  6.  Hemodynamics were within normal limits.   CONCLUSION:  Moderate disease of the LAD as described above.  The patient's  symptoms are atypical.  The plan is to proceed with a Cardiolite imaging  study to  rule out significant ischemia in the distribution of the left  anterior descending artery.  Otherwise, medical therapy can be continued.      Learta Codding, M.D. [facility]  Electronically Signed     GED/MEDQ  D:  10/10/2004  T:  10/10/2004  Job:  [id number]   cc:   Marne A. Milinda Antis, M.D. [facility]   Vania Rea. Supple, M.D.  [facility]  [address]  [address]  [place]  [place] [address]  Fax: [phone]

## 2010-06-26 ENCOUNTER — Telehealth: Payer: Self-pay | Admitting: *Deleted

## 2010-06-26 NOTE — Telephone Encounter (Signed)
Received handicap placard form from pt - it has been signed by Dr Antoine Poche and mailed back to pt

## 2010-07-10 ENCOUNTER — Telehealth: Payer: Self-pay | Admitting: Cardiology

## 2010-07-10 NOTE — Telephone Encounter (Signed)
Due to pt's hx if CAD - will ask Dr Antoine Poche if OK to use.

## 2010-07-10 NOTE — Telephone Encounter (Signed)
Per pt calling dr. Penni Bombard put pt on  topical gel   Voltaren  Would this be  O.K. To use.

## 2010-07-10 NOTE — Telephone Encounter (Signed)
OK to use 

## 2010-07-10 NOTE — Telephone Encounter (Signed)
Left message for pt that it is OK for her to use Voltaren gel per Dr Rollene Rotunda

## 2010-08-31 ENCOUNTER — Other Ambulatory Visit: Payer: Self-pay | Admitting: Cardiology

## 2010-10-13 ENCOUNTER — Telehealth: Payer: Self-pay | Admitting: Cardiology

## 2010-10-13 ENCOUNTER — Other Ambulatory Visit: Payer: Self-pay | Admitting: Cardiology

## 2010-10-13 MED ORDER — CLOPIDOGREL BISULFATE 75 MG PO TABS: 75.0000 mg | ORAL_TABLET | Freq: Every day | ORAL | Status: DC

## 2010-10-13 NOTE — Telephone Encounter (Signed)
Pt calling in need of refill of clopidogrel 75mg  (generic for plavix) sent into [facility].

## 2010-10-20 ENCOUNTER — Encounter: Payer: Self-pay | Admitting: Family Medicine

## 2010-10-23 ENCOUNTER — Encounter: Payer: Self-pay | Admitting: Family Medicine

## 2010-10-23 ENCOUNTER — Ambulatory Visit (INDEPENDENT_AMBULATORY_CARE_PROVIDER_SITE_OTHER): Payer: Medicare Other | Admitting: Family Medicine

## 2010-10-23 ENCOUNTER — Telehealth: Payer: Self-pay | Admitting: *Deleted

## 2010-10-23 VITALS — BP 130/90 | HR 59 | Temp 98.2°F | Wt 263.5 lb

## 2010-10-23 DIAGNOSIS — N760 Acute vaginitis: Secondary | ICD-10-CM

## 2010-10-23 MED ORDER — CLOBETASOL PROPIONATE 0.05 % EX CREA: TOPICAL_CREAM | Freq: Two times a day (BID) | CUTANEOUS | Status: DC

## 2010-10-23 MED ORDER — FLUCONAZOLE 150 MG PO TABS: 150.0000 mg | ORAL_TABLET | Freq: Once | ORAL | Status: AC

## 2010-10-23 NOTE — Telephone Encounter (Signed)
I called in pt's meds that were prescribed this morning to [facility], which is where she wanted them to go , they were sent electronically to [facility]

## 2010-10-23 NOTE — Telephone Encounter (Signed)
Thank you :)

## 2010-10-23 NOTE — Progress Notes (Signed)
SUBJECTIVE:  65 y.o. female complains of labial irritation- itching, burning for 2-3 weeks.  No abnormal vaginal discharge. Has tried OTC monistat with no relief of symptoms. Did have poison oak on her leg recently, she is concerned it is now on her labi.  Denies abnormal vaginal bleeding or significant pelvic pain or fever. No UTI symptoms. Denies history of known exposure to STD.  No LMP recorded. Patient is not currently having periods (Reason: Other).  Patient Active Problem List  Diagnoses  . VAGINITIS, CANDIDAL  . LACTOSE INTOLERANCE  . HYPERCHOLESTEROLEMIA, PURE  . HYPERLIPIDEMIA  . OBESITY  . STRESS REACTION, ACUTE, WITH EMOTIONAL DISTURBANCE  . TINNITUS  . HYPERTENSION  . CAD  . FIBRILLATION, ATRIAL  . G E R D  . CYSTITIS, ACUTE  . OVERACTIVE BLADDER  . INFECTION, URINARY TRACT NOS  . CONTACT DERMATITIS  . ROSACEA  . VAGINAL PRURITUS  . OSTEOARTHRITIS  . SLEEP APNEA, SEVERE  . EDEMA  . HEADACHE  . SYMPTOM, NAUSEA ALONE  . SYMPTOM, DIARRHEA NOS  . URINARY INCONTINENCE, MIXED  . SYMPTOM, PAIN, ABDOMINAL, UNSPECIFIED SITE  . POSTSURG PERCUT TRANSLUMINAL COR ANGPLSTY STS  . Vaginitis   Past Medical History  Diagnosis Date  . Headache   . Hyperlipidemia   . Hypertension   . Osteoporosis   . Atrial fibrillation     s/p DCCV and Tikosyn  . CAD (coronary artery disease) 07/2008   Past Surgical History  Procedure Date  . Cholecystectomy   . Abdominal hysterectomy     partial, bleeding  . Knee arthroscopy 2000 & 2001    left and right  . Esophagogastroduodenoscopy 2003  . Cataract extraction   . Appendectomy   . Tubal ligation   . Total knee arthroplasty     bilateral   History  Substance Use Topics  . Smoking status: Never Smoker   . Smokeless tobacco: Not on file  . Alcohol Use: No   No family history on file. Allergies  Allergen Reactions  . Rosuvastatin     REACTION: muscle and joint pain   Current Outpatient Prescriptions on File Prior  to Visit  Medication Sig Dispense Refill  . aspirin 81 MG tablet Take 81 mg by mouth daily.        . clopidogrel (PLAVIX) 75 MG tablet Take 1 tablet (75 mg total) by mouth daily.  90 tablet  3  . diltiazem (CARTIA XT) 120 MG 24 hr capsule Take 120 mg by mouth daily.        [name] dofetilide (TIKOSYN) 250 MCG capsule Take 250 mcg by mouth 2 (two) times daily.        . furosemide (LASIX) 40 MG tablet Take 40 mg by mouth daily.        . metoprolol (LOPRESSOR) 50 MG tablet TAKE 1 AND 1/2 TABLETS TWICE DAILY  270 tablet  3  . potassium chloride SA (KLOR-CON M20) 20 MEQ tablet Take 20 mEq by mouth 2 (two) times daily.         The PMH, PSH, Social History, Family History, Medications, and allergies have been reviewed in [facility], and have been updated if relevant.  OBJECTIVE:  BP 130/90  Pulse 59  Temp(Src) 98.2 F (36.8 C) (Oral)  Wt 263 lb 8 oz (119.523 kg)  She appears well, afebrile. Abdomen: benign, soft, nontender, no masses. Pelvic Exam: mild labial minor and major erythema, worst at the superior apex  ASSESSMENT:  Wet prep positive for a few yeast with  component of labial irritation as well-?allergic/ contact dermatitis.  PLAN:  GC and chlamydia DNA  probe sent to lab. Treatment: Diflucan 150 mg po x 1, in addition with as needed clobetasol for swelling and irritation. The patient indicates understanding of these issues and agrees with the plan. ROV prn if symptoms persist or worsen.

## 2010-10-23 NOTE — Patient Instructions (Signed)
Good to see you. Please try using the clobestasol cream 1-2 times daily for next several days. Call us at end of the week with an update of your symptoms.

## 2010-11-03 LAB — PROTIME-INR: INR: 2.7 — ABNORMAL HIGH

## 2010-11-10 LAB — POCT URINALYSIS DIP (DEVICE)
Bilirubin Urine: NEGATIVE
Glucose, UA: NEGATIVE mg/dL
Ketones, ur: NEGATIVE mg/dL
Nitrite: POSITIVE — AB
pH: 5.5 (ref 5.0–8.0)

## 2010-11-10 LAB — URINE CULTURE

## 2010-11-23 LAB — BASIC METABOLIC PANEL
BUN: 16
Calcium: 8.8
Creatinine, Ser: 0.91
GFR calc non Af Amer: >60
Potassium: 3.6

## 2010-11-23 LAB — PROTIME-INR
INR: 2 — ABNORMAL HIGH
INR: 2.4 — ABNORMAL HIGH
Prothrombin Time: 24 — ABNORMAL HIGH
Prothrombin Time: 29.1 — ABNORMAL HIGH

## 2010-11-23 LAB — CBC
HCT: 29.4 — ABNORMAL LOW
Hemoglobin: 10 — ABNORMAL LOW
RBC: 3.67 — ABNORMAL LOW

## 2010-12-29 ENCOUNTER — Other Ambulatory Visit: Payer: Self-pay | Admitting: *Deleted

## 2010-12-29 MED ORDER — DOFETILIDE 250 MCG PO CAPS: 250.0000 ug | ORAL_CAPSULE | Freq: Two times a day (BID) | ORAL | Status: DC

## 2011-01-03 ENCOUNTER — Other Ambulatory Visit: Payer: Self-pay | Admitting: Cardiology

## 2011-01-03 MED ORDER — DOFETILIDE 250 MCG PO CAPS: ORAL_CAPSULE | ORAL | Status: DC

## 2011-01-05 ENCOUNTER — Telehealth: Payer: Self-pay | Admitting: Cardiology

## 2011-01-05 MED ORDER — DILTIAZEM HCL ER COATED BEADS 120 MG PO CP24: 120.0000 mg | ORAL_CAPSULE | Freq: Every day | ORAL | Status: DC

## 2011-01-05 NOTE — Telephone Encounter (Signed)
New problem Pt needs diltiazem called into [facility] in [phone] she needs only 2 wk supply She usually gets it thru [facility] and let it run out. They will be faxing another request.

## 2011-01-08 ENCOUNTER — Other Ambulatory Visit: Payer: Self-pay | Admitting: Cardiology

## 2011-01-25 ENCOUNTER — Ambulatory Visit (INDEPENDENT_AMBULATORY_CARE_PROVIDER_SITE_OTHER): Payer: Medicare Other | Admitting: Cardiology

## 2011-01-25 ENCOUNTER — Encounter: Payer: Self-pay | Admitting: Cardiology

## 2011-01-25 DIAGNOSIS — I10 Essential (primary) hypertension: Secondary | ICD-10-CM

## 2011-01-25 DIAGNOSIS — I4891 Unspecified atrial fibrillation: Secondary | ICD-10-CM

## 2011-01-25 DIAGNOSIS — Z79899 Other long term (current) drug therapy: Secondary | ICD-10-CM

## 2011-01-25 DIAGNOSIS — I2510 Atherosclerotic heart disease of native coronary artery without angina pectoris: Secondary | ICD-10-CM

## 2011-01-25 LAB — CBC WITH DIFFERENTIAL/PLATELET
Basophils Absolute: 0.1 10*3/uL (ref 0.0–0.1)
Basophils Relative: 0.5 % (ref 0.0–3.0)
Eosinophils Absolute: 0.3 10*3/uL (ref 0.0–0.7)
HCT: 42.4 % (ref 36.0–46.0)
Hemoglobin: 14.4 g/dL (ref 12.0–15.0)
Lymphs Abs: 1.8 10*3/uL (ref 0.7–4.0)
MCHC: 34 g/dL (ref 30.0–36.0)
MCV: 82.9 fl (ref 78.0–100.0)
Monocytes Absolute: 0.5 10*3/uL (ref 0.1–1.0)
Neutro Abs: 6.7 10*3/uL (ref 1.4–7.7)
RBC: 5.11 Mil/uL (ref 3.87–5.11)
RDW: 14.2 % (ref 11.5–14.6)

## 2011-01-25 LAB — BASIC METABOLIC PANEL
BUN: 18 mg/dL (ref 6–23)
Calcium: 9.6 mg/dL (ref 8.4–10.5)
Creatinine, Ser: 1.2 mg/dL (ref 0.4–1.2)

## 2011-01-25 LAB — TSH: TSH: 3.44 u[IU]/mL (ref 0.35–5.50)

## 2011-01-25 LAB — PROTIME-INR: INR: 1.1 ratio — ABNORMAL HIGH (ref 0.8–1.0)

## 2011-01-25 NOTE — Assessment & Plan Note (Signed)
She is back in atrial fibrillation. After careful questioning it is very clear that she feels are no different now than she did in January when she was in sinus rhythm. Therefore, there is absolutely no evidence that keeping her in sinus rhythm would help her symptomatically. Therefore, we should pursue rate control and anticoagulation. She will stop at [facility]. She will start Coumadin and when she does she will stop Plavix. When she is therapeutic on her Coumadin she will stop aspirin. I will check blood work today to include a TSH and a CBC. She will have a 24-hour Holter monitor.

## 2011-01-25 NOTE — Assessment & Plan Note (Signed)
Her blood pressure is controlled. She will continue the meds as listed. 

## 2011-01-25 NOTE — Patient Instructions (Signed)
Please have blood work today.  (BMP, PT, PTT, CBC and TSH) Please stop your Tikosyn. Please call once you return from the beach to schedule to stop Plavix and start Coumadin.  One therapeutic you can stop your ASA. Continue all other medications as listed.  Your physician has recommended that you wear a holter monitor. Holter monitors are medical devices that record the heart's electrical activity. Doctors most often use these monitors to diagnose arrhythmias. Arrhythmias are problems with the speed or rhythm of the heartbeat. The monitor is a small, portable device. You can wear one while you do your normal daily activities. This is usually used to diagnose what is causing palpitations/syncope (passing out).

## 2011-01-25 NOTE — Progress Notes (Signed)
HPI The patient returns for followup of her atrial fibrillation. Since I last saw her she has had no new cardiovascular complaints. She doesn't notice any palpitations. She has no presyncope or syncope. She has no chest pressure, neck or arm discomfort. She continues to have some dyspnea with moderate exertion but this is unchanged from previous. She's had no PND or orthopnea. She says she never feels good mostly because of muscle aches and joint pains.  Allergies  Allergen Reactions  . Rosuvastatin     REACTION: muscle and joint pain    Current Outpatient Prescriptions  Medication Sig Dispense Refill  . aspirin 81 MG tablet Take 81 mg by mouth daily.        . clopidogrel (PLAVIX) 75 MG tablet Take 1 tablet (75 mg total) by mouth daily.  90 tablet  3  . diclofenac sodium (VOLTAREN) 1 % GEL Apply 1 application topically as needed.        . diltiazem (CARDIZEM CD) 120 MG 24 hr capsule TAKE 1 CAPSULE DAILY  90 capsule  3  . dofetilide (TIKOSYN) 250 MCG capsule Will refill for 1 month only, pt has an office visit on Dec. 20th, need to make sure the doctor will continue with Rx's  90 capsule  0  . furosemide (LASIX) 40 MG tablet Take 40 mg by mouth daily.       . magnesium oxide (MAG-OX) 400 MG tablet        . metoprolol (LOPRESSOR) 50 MG tablet TAKE 1 AND 1/2 TABLETS TWICE DAILY  270 tablet  3  . potassium chloride SA (KLOR-CON M20) 20 MEQ tablet Take 20 mEq by mouth 2 (two) times daily.         Past Medical History  Diagnosis Date  . Headache   . Hyperlipidemia   . Hypertension   . Osteoporosis   . Atrial fibrillation     s/p DCCV and Tikosyn  . CAD (coronary artery disease) 07/2008  . Duodenitis   . Headache   . Obesity     Past Surgical History  Procedure Date  . Cholecystectomy   . Abdominal hysterectomy     partial, bleeding  . Knee arthroscopy 2000 & 2001    left and right  . Esophagogastroduodenoscopy 2003  . Cataract extraction   . Appendectomy   . Tubal ligation    . Total knee arthroplasty     bilateral    ROS:  As stated in the HPI and negative for all other systems.  PHYSICAL EXAM BP 118/69  Pulse 68  Resp 16  Ht 5\' 5"  (1.651 m)  Wt 266 lb (120.657 kg)  BMI 44.26 kg/m2 GENERAL:  Well appearing HEENT:  Pupils equal round and reactive, fundi not visualized, oral mucosa unremarkable NECK:  No jugular venous distention, waveform within normal limits, carotid upstroke brisk and symmetric, no bruits, no thyromegaly LYMPHATICS:  No cervical, inguinal adenopathy LUNGS:  Clear to auscultation bilaterally BACK:  No CVA tenderness CHEST:  Unremarkable HEART:  PMI not displaced or sustained,S1 and S2 within normal limits, no S3, no clicks, no rubs, no murmurs, irregular ABD:  Flat, positive bowel sounds normal in frequency in pitch, no bruits, no rebound, no guarding, no midline pulsatile mass, no hepatomegaly, no splenomegaly EXT:  2 plus pulses throughout, no edema, no cyanosis no clubbing SKIN:  No rashes no nodules NEURO:  Cranial nerves II through XII grossly intact, motor grossly intact throughout PSYCH:  Cognitively intact, oriented to person place  and time  EKG:  01/25/2011  atrial fibrillation, left ventricle hypertrophy by voltage criteria, anterolateral T wave inversions unchanged  ASSESSMENT AND PLAN

## 2011-01-25 NOTE — Assessment & Plan Note (Signed)
She will continue with risk reduction. Meds will be managed as above. She's having no ongoing angina.

## 2011-02-05 ENCOUNTER — Telehealth: Payer: Self-pay | Admitting: *Deleted

## 2011-02-05 ENCOUNTER — Ambulatory Visit (INDEPENDENT_AMBULATORY_CARE_PROVIDER_SITE_OTHER): Payer: Medicare Other | Admitting: Internal Medicine

## 2011-02-05 ENCOUNTER — Encounter: Payer: Self-pay | Admitting: Internal Medicine

## 2011-02-05 VITALS — BP 130/80 | HR 90 | Temp 98.8°F | Ht 65.0 in | Wt 264.0 lb

## 2011-02-05 DIAGNOSIS — J209 Acute bronchitis, unspecified: Secondary | ICD-10-CM

## 2011-02-05 MED ORDER — TRAMADOL HCL 50 MG PO TBDP: 50.0000 mg | ORAL_TABLET | Freq: Every evening | ORAL | Status: DC | PRN

## 2011-02-05 MED ORDER — AMOXICILLIN 500 MG PO TABS: 1000.0000 mg | ORAL_TABLET | Freq: Two times a day (BID) | ORAL | Status: AC

## 2011-02-05 NOTE — Assessment & Plan Note (Signed)
May still be viral She is in no rush for antibiotic due to vaginal yeast infections  Will treat with tramadol for cough Rx for amoxil to fill later this week if worsening

## 2011-02-05 NOTE — Telephone Encounter (Signed)
Spoke to Ms. Joost to scheduled a 24hr holter monitor /new coumadin eval appointment per office note 01/25/11. Patient has an appointment for 02/07/11 @ 10:15.

## 2011-02-05 NOTE — Patient Instructions (Signed)
Please start the amoxicillin antibiotic if you are worsening or if you are not improving by the end of the week

## 2011-02-05 NOTE — Progress Notes (Signed)
Subjective:    Patient ID: [name], female    DOB: [birth year], 65 y.o.   MRN: [id number]  HPI Has "touch of bronchitis" Started ~1 week ago and progressively worsening Bad cough, esp at night, but also in day Some sinus drainage Cough is dry---slight clear sputum  Tmax 99.9 No chills or sweats at night Chronic SOB from atrial fib---no change in that  No sore throat Ears are a "little tight" Some rattling with the cough  Has tried coricidin and robitussin --not clearly helpful  Current Outpatient Prescriptions on File Prior to Visit  Medication Sig Dispense Refill  . aspirin 81 MG tablet Take 81 mg by mouth daily.        . clopidogrel (PLAVIX) 75 MG tablet Take 1 tablet (75 mg total) by mouth daily.  90 tablet  3  . diclofenac sodium (VOLTAREN) 1 % GEL Apply 1 application topically as needed.        . diltiazem (CARDIZEM CD) 120 MG 24 hr capsule TAKE 1 CAPSULE DAILY  90 capsule  3  . furosemide (LASIX) 40 MG tablet Take 40 mg by mouth daily.       . magnesium oxide (MAG-OX) 400 MG tablet Take 400 mg by mouth daily.       . metoprolol (LOPRESSOR) 50 MG tablet TAKE 1 AND 1/2 TABLETS TWICE DAILY  270 tablet  3  . potassium chloride SA (KLOR-CON M20) 20 MEQ tablet Take 20 mEq by mouth 2 (two) times daily.         Allergies  Allergen Reactions  . Rosuvastatin     REACTION: muscle and joint pain    Past Medical History  Diagnosis Date  . Headache   . Hyperlipidemia   . Hypertension   . Osteoporosis   . Campath-induced atrial fibrillation     s/p DCCV and Tikosyn  . CAD (coronary artery disease) 07/2008  . Duodenitis   . Headache   . Obesity     Past Surgical History  Procedure Date  . Cholecystectomy   . Abdominal hysterectomy     partial, bleeding  . Knee arthroscopy 2000 & 2001    left and right  . Esophagogastroduodenoscopy 2003  . Cataract extraction   . Appendectomy   . Tubal ligation   . Total knee arthroplasty     bilateral    Family History    Problem Relation Age of Onset  . Other Mother     GI Bleed  . Pneumonia Father   . Coronary artery disease Brother     History   Social History  . Marital Status: Married    Spouse Name: N/A    Number of Children: 2  . Years of Education: N/A   Occupational History  .     Social History Main Topics  . Smoking status: Never Smoker   . Smokeless tobacco: Never Used  . Alcohol Use: No  . Drug Use: No  . Sexually Active: Not on file   Other Topics Concern  . Not on file   Social History Narrative  . No narrative on file   Review of Systems Slight diarrhea--relates to her magnesium No vomiting--some gagging with the cough Appetite is off     Objective:   Physical Exam  Constitutional: She appears well-developed and well-nourished. No distress.       Coarse cough  HENT:       No sinus tenderness Moderate nasal congestion Slight pharyngeal injection without exudates  Neck: Normal range of motion. Neck supple.  Pulmonary/Chest: Breath sounds normal. No respiratory distress. She has no wheezes. She has no rales.  Lymphadenopathy:    She has no cervical adenopathy.          Assessment & Plan:

## 2011-02-07 ENCOUNTER — Encounter (INDEPENDENT_AMBULATORY_CARE_PROVIDER_SITE_OTHER): Payer: Medicare Other

## 2011-02-07 ENCOUNTER — Ambulatory Visit (INDEPENDENT_AMBULATORY_CARE_PROVIDER_SITE_OTHER): Payer: Medicare Other | Admitting: *Deleted

## 2011-02-07 DIAGNOSIS — I4891 Unspecified atrial fibrillation: Secondary | ICD-10-CM

## 2011-02-07 DIAGNOSIS — Z7901 Long term (current) use of anticoagulants: Secondary | ICD-10-CM | POA: Diagnosis not present

## 2011-02-07 LAB — POCT INR: INR: 1.1

## 2011-02-07 MED ORDER — WARFARIN SODIUM 5 MG PO TABS: 5.0000 mg | ORAL_TABLET | Freq: Every day | ORAL | Status: DC

## 2011-02-07 NOTE — Patient Instructions (Signed)

## 2011-02-08 ENCOUNTER — Telehealth: Payer: Self-pay | Admitting: Cardiology

## 2011-02-08 NOTE — Telephone Encounter (Signed)
New Msg: Pharmacy calling wanting a refill for tykosin 250 mg  for pt. Please fax this refill request to Fax # [phone]

## 2011-02-08 NOTE — Telephone Encounter (Signed)
Dr. Antoine Poche d/c'd her tikosyn at her last office visit tried to conatact [facility]. Was on hold for over 20 minutes.

## 2011-02-09 ENCOUNTER — Other Ambulatory Visit: Payer: Self-pay

## 2011-02-12 MED ORDER — DOFETILIDE 250 MCG PO CAPS: 250.0000 ug | ORAL_CAPSULE | Freq: Two times a day (BID) | ORAL | Status: DC

## 2011-02-15 ENCOUNTER — Ambulatory Visit (INDEPENDENT_AMBULATORY_CARE_PROVIDER_SITE_OTHER): Payer: Medicare Other | Admitting: *Deleted

## 2011-02-15 ENCOUNTER — Telehealth: Payer: Self-pay | Admitting: Cardiology

## 2011-02-15 DIAGNOSIS — Z7901 Long term (current) use of anticoagulants: Secondary | ICD-10-CM | POA: Diagnosis not present

## 2011-02-15 DIAGNOSIS — I4891 Unspecified atrial fibrillation: Secondary | ICD-10-CM

## 2011-02-15 MED ORDER — DILTIAZEM HCL ER COATED BEADS 180 MG PO CP24: 180.0000 mg | ORAL_CAPSULE | Freq: Every day | ORAL | Status: DC

## 2011-02-15 NOTE — Telephone Encounter (Signed)
Return in one month.  

## 2011-02-15 NOTE — Telephone Encounter (Signed)
Pt aware of results of monitor and to increase Cardizem to 180 mg a day.  She would like to know when she should be seen back.  Will ask Dr Antoine Poche

## 2011-02-15 NOTE — Telephone Encounter (Signed)
New Problem   Patient called regarding holter monitor result.  Please call and advise patient if she needs to be seen for F/U appnt.

## 2011-02-16 NOTE — Telephone Encounter (Signed)
appt scheduled for 2/25 - pt aware

## 2011-02-19 ENCOUNTER — Ambulatory Visit (INDEPENDENT_AMBULATORY_CARE_PROVIDER_SITE_OTHER): Payer: Medicare Other | Admitting: Family Medicine

## 2011-02-19 ENCOUNTER — Encounter: Payer: Self-pay | Admitting: Family Medicine

## 2011-02-19 VITALS — BP 112/72 | HR 83 | Temp 97.9°F | Wt 264.5 lb

## 2011-02-19 DIAGNOSIS — R05 Cough: Secondary | ICD-10-CM

## 2011-02-19 MED ORDER — BENZONATATE 100 MG PO CAPS: 100.0000 mg | ORAL_CAPSULE | Freq: Four times a day (QID) | ORAL | Status: DC | PRN

## 2011-02-19 NOTE — Patient Instructions (Signed)
Good to see you. Finish your amoxicillin, I sent in a cough suppressant for you.

## 2011-02-19 NOTE — Progress Notes (Signed)
Subjective:    Patient ID: [name], female    DOB: 1945-12-03, 66 y.o.   MRN: [id number]  HPI Here for persistent cough. Saw Dr. Alphonsus Sias on 12/31- given rx for amoxicillin which she just started taking a few days ago. Also given tramadol for cough.    Bad cough, esp at night, but also in day Some sinus drainage Cough is dry---slight clear sputum Afebrile.  No chills or sweats at night Chronic SOB from atrial fib---no change in that  Ears "full of fluid"  Current Outpatient Prescriptions on File Prior to Visit  Medication Sig Dispense Refill  . aspirin 81 MG tablet Take 81 mg by mouth daily.        . clopidogrel (PLAVIX) 75 MG tablet Take 1 tablet (75 mg total) by mouth daily.  90 tablet  3  . diclofenac sodium (VOLTAREN) 1 % GEL Apply 1 application topically as needed.        . diltiazem (CARDIZEM CD) 180 MG 24 hr capsule Take 1 capsule (180 mg total) by mouth daily.  90 capsule  3  . dofetilide (TIKOSYN) 250 MCG capsule Take 1 capsule (250 mcg total) by mouth 2 (two) times daily.  180 capsule  3  . furosemide (LASIX) 40 MG tablet Take 40 mg by mouth daily.       . magnesium oxide (MAG-OX) 400 MG tablet Take 400 mg by mouth daily.       . metoprolol (LOPRESSOR) 50 MG tablet TAKE 1 AND 1/2 TABLETS TWICE DAILY  270 tablet  3  . potassium chloride SA (KLOR-CON M20) 20 MEQ tablet Take 20 mEq by mouth 2 (two) times daily.       . TraMADol HCl 50 MG TBDP Take 50-100 mg by mouth at bedtime as needed.  30 tablet  0  . warfarin (COUMADIN) 5 MG tablet Take 1 tablet (5 mg total) by mouth daily. Or as directed  30 tablet  2    Allergies  Allergen Reactions  . Rosuvastatin     REACTION: muscle and joint pain    Past Medical History  Diagnosis Date  . Headache   . Hyperlipidemia   . Hypertension   . Osteoporosis   . Atrial fibrillation     s/p DCCV and Tikosyn  . CAD (coronary artery disease) 07/2008  . Duodenitis   . Headache   . Obesity     Past Surgical History    Procedure Date  . Cholecystectomy   . Abdominal hysterectomy     partial, bleeding  . Knee arthroscopy 2000 & 2001    left and right  . Esophagogastroduodenoscopy 2003  . Cataract extraction   . Appendectomy   . Tubal ligation   . Total knee arthroplasty     bilateral    Family History  Problem Relation Age of Onset  . Other Mother     GI Bleed  . Pneumonia Father   . Coronary artery disease Brother     History   Social History  . Marital Status: Married    Spouse Name: N/A    Number of Children: 2  . Years of Education: N/A   Occupational History  .     Social History Main Topics  . Smoking status: Never Smoker   . Smokeless tobacco: Never Used  . Alcohol Use: No  . Drug Use: No  . Sexually Active: Not on file   Other Topics Concern  . Not on file   Social  History Narrative  . No narrative on file   Review of Systems Slight diarrhea--relates to her magnesium No vomiting--some gagging with the cough Appetite is off     Objective:   Physical Exam  BP 112/72  Pulse 83  Temp(Src) 97.9 F (36.6 C) (Oral)  Wt 264 lb 8 oz (119.976 kg)  SpO2 98%  Constitutional: She appears well-developed and well-nourished. No distress.       Coarse cough  HENT:       No sinus tenderness Moderate nasal congestion Slight pharyngeal injection without exudates  Neck: Normal range of motion. Neck supple.  Pulmonary/Chest: Breath sounds normal. No respiratory distress. She has no wheezes. She has no rales.  Lymphadenopathy:    She has no cervical adenopathy.      Assessment & Plan:  1.  URI- Just started amoxicillin.  Advised to finish course. Place on Tessalon as needed for cough. The patient indicates understanding of these issues and agrees with the plan.

## 2011-02-22 ENCOUNTER — Ambulatory Visit (INDEPENDENT_AMBULATORY_CARE_PROVIDER_SITE_OTHER): Payer: Medicare Other | Admitting: *Deleted

## 2011-02-22 ENCOUNTER — Telehealth: Payer: Self-pay

## 2011-02-22 DIAGNOSIS — Z7901 Long term (current) use of anticoagulants: Secondary | ICD-10-CM

## 2011-02-22 DIAGNOSIS — I4891 Unspecified atrial fibrillation: Secondary | ICD-10-CM | POA: Diagnosis not present

## 2011-02-22 MED ORDER — HYDROCOD POLST-CHLORPHEN POLST 10-8 MG/5ML PO LQCR: 5.0000 mL | Freq: Two times a day (BID) | ORAL | Status: DC | PRN

## 2011-02-22 NOTE — Telephone Encounter (Signed)
Rx for Tussionex called to [facility], patient notified as instructed via telephone.  I offered patient an appt to be seen but she declined, she stated that she will take the cough syrup and let us know how that helps.  Advised patient that if she develops fever, SOB, wheezing, or new symptoms develop or worsen then she needed to give [place] a call right away during office hours and after office hours go to the ER or an Urgent Care.

## 2011-02-22 NOTE — Telephone Encounter (Signed)
Pt saw Dr Dayton Martes 02/19/11 and was told to continue Amoxicillin. Pt is no better. Pt states coughing constantly with productive cough with clear phlegm. Also has sinus drainage. No fever and no sorethroat. Pt said Tessalon is not helping cough. Pt wants a different antibiotic and cough med sent to [facility]. Pt also takes Coumadin. Pt can be reached at [phone].

## 2011-02-22 NOTE — Telephone Encounter (Signed)
Cough can linger for 3-4 weeks after URI.  Ok to call tussionex into pharmacy as written below but she does not need another abx yet based on what she is saying (I would need to evaluate again to know for sure).  I would give it more time.

## 2011-03-02 ENCOUNTER — Ambulatory Visit (INDEPENDENT_AMBULATORY_CARE_PROVIDER_SITE_OTHER): Payer: Medicare Other | Admitting: *Deleted

## 2011-03-02 DIAGNOSIS — I4891 Unspecified atrial fibrillation: Secondary | ICD-10-CM

## 2011-03-02 DIAGNOSIS — Z7901 Long term (current) use of anticoagulants: Secondary | ICD-10-CM | POA: Diagnosis not present

## 2011-03-02 LAB — POCT INR: INR: 2.9

## 2011-03-21 ENCOUNTER — Ambulatory Visit (INDEPENDENT_AMBULATORY_CARE_PROVIDER_SITE_OTHER): Payer: Medicare Other | Admitting: *Deleted

## 2011-03-21 DIAGNOSIS — I4891 Unspecified atrial fibrillation: Secondary | ICD-10-CM

## 2011-03-21 DIAGNOSIS — Z7901 Long term (current) use of anticoagulants: Secondary | ICD-10-CM

## 2011-04-02 ENCOUNTER — Telehealth: Payer: Self-pay | Admitting: Cardiology

## 2011-04-02 ENCOUNTER — Encounter: Payer: Self-pay | Admitting: Cardiology

## 2011-04-02 ENCOUNTER — Ambulatory Visit (INDEPENDENT_AMBULATORY_CARE_PROVIDER_SITE_OTHER): Payer: Medicare Other | Admitting: Pharmacist

## 2011-04-02 ENCOUNTER — Ambulatory Visit (INDEPENDENT_AMBULATORY_CARE_PROVIDER_SITE_OTHER): Payer: Medicare Other | Admitting: Cardiology

## 2011-04-02 DIAGNOSIS — I10 Essential (primary) hypertension: Secondary | ICD-10-CM | POA: Diagnosis not present

## 2011-04-02 DIAGNOSIS — I4891 Unspecified atrial fibrillation: Secondary | ICD-10-CM

## 2011-04-02 DIAGNOSIS — E669 Obesity, unspecified: Secondary | ICD-10-CM

## 2011-04-02 DIAGNOSIS — Z7901 Long term (current) use of anticoagulants: Secondary | ICD-10-CM

## 2011-04-02 DIAGNOSIS — I2510 Atherosclerotic heart disease of native coronary artery without angina pectoris: Secondary | ICD-10-CM

## 2011-04-02 NOTE — Assessment & Plan Note (Signed)
The patient  tolerates this rhythm and rate control and anticoagulation.  She would like to consider one of the new agents.  I would suggest Xarelto given her past history of duodenitis.  She is going to check with her insurance to see what the cost would be.

## 2011-04-02 NOTE — Progress Notes (Signed)
   HPI The patient returns for followup of her atrial fibrillation. Since I last saw her she has had no new cardiovascular complaints. She doesn't notice any palpitations. She has no presyncope or syncope. She has no chest pressure, neck or arm discomfort. She continues to have some dyspnea with moderate exertion.  She is not as active as I would like but with her activities of daily living she has no new complaints.  She does feel fatigued but in the past she felt this way in sinus rhythm.  Allergies  Allergen Reactions  . Rosuvastatin     REACTION: muscle and joint pain    Current Outpatient Prescriptions  Medication Sig Dispense Refill  . diltiazem (CARDIZEM CD) 180 MG 24 hr capsule Take 1 capsule (180 mg total) by mouth daily.  90 capsule  3  . furosemide (LASIX) 40 MG tablet Take 40 mg by mouth daily.       . magnesium oxide (MAG-OX) 400 MG tablet Take 400 mg by mouth daily.       . metoprolol (LOPRESSOR) 50 MG tablet TAKE 1 AND 1/2 TABLETS TWICE DAILY  270 tablet  3  . potassium chloride SA (KLOR-CON M20) 20 MEQ tablet Take 20 mEq by mouth 2 (two) times daily.       [name] warfarin (COUMADIN) 5 MG tablet Take 1 tablet (5 mg total) by mouth daily. Or as directed  30 tablet  2    Past Medical History  Diagnosis Date  . Headache   . Hyperlipidemia   . Hypertension   . Osteoporosis   . Atrial fibrillation     s/p DCCV and Tikosyn  . CAD (coronary artery disease) 07/2008  . Duodenitis   . Headache   . Obesity     Past Surgical History  Procedure Date  . Cholecystectomy   . Abdominal hysterectomy     partial, bleeding  . Knee arthroscopy 2000 & 2001    left and right  . Esophagogastroduodenoscopy 2003  . Cataract extraction   . Appendectomy   . Tubal ligation   . Total knee arthroplasty     bilateral    ROS:  As stated in the HPI and negative for all other systems.  PHYSICAL EXAM BP 120/75  Pulse 50  Ht 5\' 5"  (1.651 m)  Wt 118.389 kg (261 lb)  BMI 43.43  kg/m2 GENERAL:  Well appearing HEENT:  Pupils equal round and reactive, fundi not visualized, oral mucosa unremarkable NECK:  No jugular venous distention, waveform within normal limits, carotid upstroke brisk and symmetric, no bruits, no thyromegaly LYMPHATICS:  No cervical, inguinal adenopathy LUNGS:  Clear to auscultation bilaterally BACK:  No CVA tenderness CHEST:  Unremarkable HEART:  PMI not displaced or sustained,S1 and S2 within normal limits, no S3, no clicks, no rubs, no murmurs, irregular ABD:  Flat, positive bowel sounds normal in frequency in pitch, no bruits, no rebound, no guarding, no midline pulsatile mass, no hepatomegaly, no splenomegaly EXT:  2 plus pulses throughout, no edema, no cyanosis no clubbing   ASSESSMENT AND PLAN

## 2011-04-02 NOTE — Assessment & Plan Note (Signed)
She will continue with risk reduction. Meds will be managed as above. She's having no ongoing angina. 

## 2011-04-02 NOTE — Assessment & Plan Note (Signed)
She understands the need to weight loss with diet and exercise.

## 2011-04-02 NOTE — Assessment & Plan Note (Signed)
Her blood pressure is controlled. She will continue the meds as listed. 

## 2011-04-02 NOTE — Telephone Encounter (Signed)
New Msg: pt calling wanting to speak with nurse about pt deciding to stick with coumadin for pt blood thinner. Pt stated the other is too expensive. Pt would like RX of coumadin 5 mg sent to [facility]. Please return pt call to discuss further.   FAX #: [phone]

## 2011-04-02 NOTE — Patient Instructions (Signed)
The current medical regimen is effective;  continue present plan and medications.  Follow up in 1 year with Dr Hochrein.  You will receive a letter in the mail 2 months before you are due.  Please call us when you receive this letter to schedule your follow up appointment.  

## 2011-04-03 ENCOUNTER — Telehealth: Payer: Self-pay | Admitting: *Deleted

## 2011-04-03 MED ORDER — WARFARIN SODIUM 5 MG PO TABS: 5.0000 mg | ORAL_TABLET | ORAL | Status: DC

## 2011-04-03 NOTE — Telephone Encounter (Signed)
Have called and left patient a message, script sent to [facility] mail order coumadin 5mg  tablets. Patient does have a f/u appointment with coumadin clinic

## 2011-04-03 NOTE — Telephone Encounter (Signed)
Patient called and confirmed she did have a f/u appointment at [facility] and that the nurse did call in script for coumadin per mail order pharmacy.

## 2011-04-16 ENCOUNTER — Ambulatory Visit (INDEPENDENT_AMBULATORY_CARE_PROVIDER_SITE_OTHER): Payer: Medicare Other | Admitting: *Deleted

## 2011-04-16 DIAGNOSIS — I4891 Unspecified atrial fibrillation: Secondary | ICD-10-CM | POA: Diagnosis not present

## 2011-04-16 DIAGNOSIS — Z7901 Long term (current) use of anticoagulants: Secondary | ICD-10-CM

## 2011-04-16 LAB — POCT INR: INR: 3.4

## 2011-04-30 ENCOUNTER — Ambulatory Visit (INDEPENDENT_AMBULATORY_CARE_PROVIDER_SITE_OTHER): Payer: Medicare Other

## 2011-04-30 DIAGNOSIS — I4891 Unspecified atrial fibrillation: Secondary | ICD-10-CM | POA: Diagnosis not present

## 2011-04-30 DIAGNOSIS — Z7901 Long term (current) use of anticoagulants: Secondary | ICD-10-CM

## 2011-04-30 LAB — POCT INR: INR: 1.6

## 2011-05-15 ENCOUNTER — Ambulatory Visit (INDEPENDENT_AMBULATORY_CARE_PROVIDER_SITE_OTHER): Payer: Medicare Other | Admitting: *Deleted

## 2011-05-15 DIAGNOSIS — Z7901 Long term (current) use of anticoagulants: Secondary | ICD-10-CM

## 2011-05-15 DIAGNOSIS — I4891 Unspecified atrial fibrillation: Secondary | ICD-10-CM

## 2011-05-15 LAB — POCT INR: INR: 2.9

## 2011-06-05 ENCOUNTER — Ambulatory Visit (INDEPENDENT_AMBULATORY_CARE_PROVIDER_SITE_OTHER): Payer: Medicare Other | Admitting: Pharmacist

## 2011-06-05 DIAGNOSIS — Z7901 Long term (current) use of anticoagulants: Secondary | ICD-10-CM

## 2011-06-05 DIAGNOSIS — I4891 Unspecified atrial fibrillation: Secondary | ICD-10-CM

## 2011-06-26 ENCOUNTER — Ambulatory Visit (INDEPENDENT_AMBULATORY_CARE_PROVIDER_SITE_OTHER): Payer: Medicare Other | Admitting: *Deleted

## 2011-06-26 ENCOUNTER — Ambulatory Visit (INDEPENDENT_AMBULATORY_CARE_PROVIDER_SITE_OTHER): Payer: Medicare Other | Admitting: Nurse Practitioner

## 2011-06-26 ENCOUNTER — Encounter: Payer: Self-pay | Admitting: Nurse Practitioner

## 2011-06-26 VITALS — BP 112/58 | HR 68 | Ht 65.0 in | Wt 271.8 lb

## 2011-06-26 DIAGNOSIS — I5032 Chronic diastolic (congestive) heart failure: Secondary | ICD-10-CM

## 2011-06-26 DIAGNOSIS — Z7901 Long term (current) use of anticoagulants: Secondary | ICD-10-CM | POA: Diagnosis not present

## 2011-06-26 DIAGNOSIS — I4891 Unspecified atrial fibrillation: Secondary | ICD-10-CM | POA: Diagnosis not present

## 2011-06-26 DIAGNOSIS — R0789 Other chest pain: Secondary | ICD-10-CM

## 2011-06-26 DIAGNOSIS — R0609 Other forms of dyspnea: Secondary | ICD-10-CM | POA: Diagnosis not present

## 2011-06-26 DIAGNOSIS — R0989 Other specified symptoms and signs involving the circulatory and respiratory systems: Secondary | ICD-10-CM

## 2011-06-26 DIAGNOSIS — R0600 Dyspnea, unspecified: Secondary | ICD-10-CM

## 2011-06-26 LAB — BASIC METABOLIC PANEL
CO2: 23 mEq/L (ref 19–32)
Calcium: 9.5 mg/dL (ref 8.4–10.5)
Creatinine, Ser: 1.1 mg/dL (ref 0.4–1.2)
GFR: 55.39 mL/min — ABNORMAL LOW (ref >60.00)
Glucose, Bld: 98 mg/dL (ref 70–99)
Sodium: 139 mEq/L (ref 135–145)

## 2011-06-26 LAB — POCT INR: INR: 2.6

## 2011-06-26 MED ORDER — FUROSEMIDE 40 MG PO TABS: ORAL_TABLET | ORAL | Status: DC

## 2011-06-26 NOTE — Progress Notes (Signed)
Patient Name: [name] Date of Encounter: 06/26/2011  Primary Care Provider:  Ruthe Mannan, MD, MD Primary Cardiologist:  Shela Commons. Hochrein, MD  Patient Profile  66 year old female with history of CAD and persistent atrial fibrillation who presents secondary to progressive dyspnea.  Problem List   Past Medical History  Diagnosis Date  . Hyperlipidemia   . Hypertension   . Osteoporosis   . Atrial fibrillation     a. s/p multiple cardioversions;  b. previously on Tikosyn - ineffective;  c. Rate controlled & on coumadin  . CAD (coronary artery disease) 07/2008    a. 07/2008 NSTEMI ->Cath: LM nl, LAD 70p/90p, 7m, LCX nl, RCA nl, EF 60%.  The LAD was stented with a 2.25x87mm Veri-Flex BMS  . Duodenitis   . Headache   . Chronic diastolic CHF (congestive heart failure)     a. 07/2008 Echo: EF 60-65%, Triv AI/MR, Mild TR  . Morbid obesity    Past Surgical History  Procedure Date  . Cholecystectomy   . Abdominal hysterectomy     partial, bleeding  . Knee arthroscopy 2000 & 2001    left and right  . Esophagogastroduodenoscopy 2003  . Cataract extraction   . Appendectomy   . Tubal ligation   . Total knee arthroplasty     bilateral    Allergies  Allergies  Allergen Reactions  . Rosuvastatin     REACTION: muscle and joint pain    HPI  66 year old female with the above problem list.  She was last seen in clinic by Dr. Antoine Poche in February.  At that time she was doing relatively well and experiencing dyspnea with moderate levels of exertion.  Patient is on Lasix at home but only takes this about 3 times per week depending upon how much lower extremity edema she might have.  Over the past 2 months or more, she has noted increasing dyspnea on exertion and this is occasionally associated with mild chest tightness.  Symptoms lasted just a minute or 2 and resolved with rest.  She thinks she can only vacuum one room at her home prior to having to rest, whereas a few months ago she  could've vacuumed the whole house.  In the setting of dyspnea, she has had increasing lower extremity edema but has not had orthopnea, PND, or early satiety.  Unfortunately, she does not routinely weigh herself at home.  Because of progression of dyspnea and edema, about one week ago she started taking Lasix daily.  Since starting it daily, she's had some reduction in her degree of dyspnea and also in her lower extremity edema.  Her weight today is 271 pounds which is up 10 pounds since her visit in February of this year.  Home Medications  Prior to Admission medications   Medication Sig Start Date End Date Taking? Authorizing Provider  diltiazem (CARDIZEM CD) 180 MG 24 hr capsule Take 1 capsule (180 mg total) by mouth daily. 02/15/11  Yes Rollene Rotunda, MD  furosemide (LASIX) 40 MG tablet Take 40 mg twice daily for 1 week then decrease to 40 mg daily 06/26/11  Yes Ok Anis, NP  magnesium oxide (MAG-OX) 400 MG tablet Take 400 mg by mouth daily.  08/31/10  Yes Rollene Rotunda, MD  metoprolol (LOPRESSOR) 50 MG tablet TAKE 1 AND 1/2 TABLETS TWICE DAILY 10/13/10  Yes Rollene Rotunda, MD  potassium chloride SA (KLOR-CON M20) 20 MEQ tablet Take 20 mEq by mouth 2 (two) times daily.    Yes  Historical Provider, MD  warfarin (COUMADIN) 5 MG tablet Take 1 tablet (5 mg total) by mouth as directed. Take as directed by coumadin clinic. This is a ninety day supply. 04/03/11 04/02/12 Yes Rollene Rotunda, MD   Review of Systems General:  No chills, fever, night sweats or weight changes.  Cardiovascular:  Notable for dyspnea exertion unfortunately edema as outlined above.  She has also had chest tightness associated with dyspnea and higher levels of activity.No orthopnea, palpitations, paroxysmal nocturnal dyspnea. Dermatological: No rash, lesions/masses Respiratory: No cough, dyspnea Urologic: No hematuria, dysuria Abdominal:   No nausea, vomiting, diarrhea, bright red blood per rectum, melena, or  hematemesis Neurologic:  No visual changes, wkns, changes in mental status. All other systems reviewed and are otherwise negative except as noted above.  Physical Exam  Blood pressure 112/58, pulse 68, height 5\' 5"  (1.651 m), weight 271 lb 12.8 oz (123.288 kg).  General: Pleasant, NAD Psych: Normal affect. Neuro: Alert and oriented X 3. Moves all extremities spontaneously. HEENT: Normal  Neck: Supple and obese.  Difficult to assess JVP.  No bruits. Lungs:  Resp regular and unlabored, CTA. Heart: irregularly irregular, no s3, s4, or murmurs. Abdomen: Soft, non-tender, non-distended, BS + x 4.  Extremities: No clubbing, cyanosis.  1-2+ bilateral lower extremity edema to the knees. DP/PT/Radials 2+ and equal bilaterally.  Accessory Clinical Findings  ECG - atrial fibrillation, 68, anterolateral T wave inversion which is not changed from old ECGs.  Assessment & Plan  1.  Acute on chronic diastolic congestive heart failure:  Patient has had progressive dyspnea on exertion in the setting of increasing lower extremity edema.  Her weight is up 10 pounds since her last visit here.  She had been taking her Lasix only about 3 times per week but over the past week has increased to a daily dose and with this change, has noted some improvement in symptoms.  We will repeat 2-D echocardiogram to reevaluate LV function as it has been nearly 3 years.  I've instructed her to weigh herself daily and increase her Lasix to 40 mg b.i.d. Over the next week and then reduce it back to 40 mg daily.  We will check a basic metabolic panel today and make adjustments in her home dose test if necessary.  Her heart rate and blood pressure are well controlled.  2.  Exertional chest pain/coronary artery disease:  In the setting of volume overload and dyspnea, patient has also noted intermittent chest tightness.  This has not been occurring with all activity but from time to time.  As result, we will obtain a left scan Myoview  to rule out ischemia.  Continue beta blocker.  She has previously been intolerant to statins.  3.  Atrial fibrillation:  This is well rate controlled.  Patient believe this is contributing to her symptoms she was also in atrial fibrillation in February of this year in December of last year at which time she was doing okay.  She is well rate controlled on diltiazem and beta blocker therapy.  Her INRs followed closely in Coumadin clinic and is therapeutic.  4.  Hypertension: Stable  5.  Disposition: Followup testing as outlined above.  Myoview is abnormal plan diagnostic catheterization.  We'll arrange for followup in clinic in the next 2-3 weeks or sooner if necessary.  Nicolasa Ducking, NP 06/26/2011, 12:12 PM

## 2011-06-26 NOTE — Patient Instructions (Signed)
Your physician recommends that you schedule a follow-up appointment to follow your test with  Dr Antoine Poche or Ward Givens, NP Your physician recommends that you have lab work drawn today [facility])  Your physician has requested that you have an echocardiogram. Echocardiography is a painless test that uses sound waves to create images of your heart. It provides your doctor with information about the size and shape of your heart and how well your heart's chambers and valves are working. This procedure takes approximately one hour. There are no restrictions for this procedure.  Your physician has requested that you have a lexiscan myoview. For further information please visit [email or web address] Please follow instruction sheet, as given.  Your physician has recommended you make the following change in your medication: INCREASE Furosemide to 40 mg twice daily for 1 week the DECREASE to 40 mg daily

## 2011-07-04 ENCOUNTER — Ambulatory Visit (HOSPITAL_COMMUNITY): Payer: Medicare Other | Attending: Nurse Practitioner | Admitting: Radiology

## 2011-07-04 VITALS — BP 111/65 | Ht 65.0 in | Wt 264.0 lb

## 2011-07-04 DIAGNOSIS — I252 Old myocardial infarction: Secondary | ICD-10-CM | POA: Insufficient documentation

## 2011-07-04 DIAGNOSIS — R0789 Other chest pain: Secondary | ICD-10-CM | POA: Insufficient documentation

## 2011-07-04 DIAGNOSIS — R0609 Other forms of dyspnea: Secondary | ICD-10-CM | POA: Diagnosis not present

## 2011-07-04 DIAGNOSIS — I10 Essential (primary) hypertension: Secondary | ICD-10-CM | POA: Insufficient documentation

## 2011-07-04 DIAGNOSIS — E785 Hyperlipidemia, unspecified: Secondary | ICD-10-CM | POA: Insufficient documentation

## 2011-07-04 DIAGNOSIS — R002 Palpitations: Secondary | ICD-10-CM | POA: Insufficient documentation

## 2011-07-04 DIAGNOSIS — I4891 Unspecified atrial fibrillation: Secondary | ICD-10-CM

## 2011-07-04 DIAGNOSIS — R0602 Shortness of breath: Secondary | ICD-10-CM | POA: Insufficient documentation

## 2011-07-04 DIAGNOSIS — I5030 Unspecified diastolic (congestive) heart failure: Secondary | ICD-10-CM | POA: Diagnosis not present

## 2011-07-04 DIAGNOSIS — I509 Heart failure, unspecified: Secondary | ICD-10-CM | POA: Diagnosis not present

## 2011-07-04 DIAGNOSIS — R0989 Other specified symptoms and signs involving the circulatory and respiratory systems: Secondary | ICD-10-CM | POA: Insufficient documentation

## 2011-07-04 MED ORDER — REGADENOSON 0.4 MG/5ML IV SOLN
0.4000 mg | Freq: Once | INTRAVENOUS | Status: AC
  Administered 2011-07-04: 0.4 mg via INTRAVENOUS

## 2011-07-04 MED ORDER — TECHNETIUM TC 99M TETROFOSMIN IV KIT
33.0000 | PACK | Freq: Once | INTRAVENOUS | Status: AC | PRN
  Administered 2011-07-04: 33 via INTRAVENOUS

## 2011-07-04 NOTE — Progress Notes (Signed)
[facility] SITE 3 NUCLEAR MED [address] [place] [address] [phone]  Cardiology Nuclear Med Study  [name] is a 66 y.o. female     MRN : [id number]     DOB: 12/10/1945  Procedure Date: 07/04/2011  Nuclear Med Background Indication for Stress Test:  Evaluation for Ischemia and Stent Patency History:  AFIB,Diastolic CHF, 2009 MPS: NL EF: 78% , 07/2008 Cardioversion H/O of multiple, ECHO: EF: 60-65%, MI-NSTEMI-Heart Cath: LAD 90% stent EF: 60% NL coronaries,-Stent: LAD Cardiac Risk Factors: Hypertension and Lipids  Symptoms:  Chest Tightness,, Chest Tightness with Exertion, DOE, Palpitations and SOB   Nuclear Pre-Procedure Caffeine/Decaff Intake:  None NPO After: 7:30pm   Lungs:  clear O2 Sat: 95% on room air. IV 0.9% NS with Angio Cath:  22g  IV Site: R Hand x 1, tolerated well IV Started by:  Irean Hong, RN  Chest Size (in):  44 Cup Size: C  Height: 5\' 5"  (1.651 m)  Weight:  264 lb (119.75 kg)  BMI:  Body mass index is 43.93 kg/(m^2). Tech Comments:  Took metoprolol and cardizem this am     Nuclear Med Study 1 or 2 day study: 2 day  Stress Test Type:  Lexiscan  Reading MD: Marca Ancona, MD  Order Authorizing Provider:  Rollene Rotunda, MD  Resting Radionuclide: Technetium 26m Tetrofosmin  Resting Radionuclide Dose: 33.0 mCi  07/04/11  Stress Radionuclide:  Technetium 57m Tetrofosmin  Stress Radionuclide Dose: 32.8 mCi  07/05/11          Stress Protocol Rest HR: 73 Stress HR: 80  Rest BP: 111/65 Stress BP: 116/69  Exercise Time (min): n/a METS: n/a   Predicted Max HR: 152 bpm % Max HR: 52.63 bpm Rate Pressure Product: 9280   Dose of Adenosine (mg):  n/a Dose of Lexiscan: 0.4 mg  Dose of Atropine (mg): n/a Dose of Dobutamine: n/a mcg/kg/min (at max HR)  Stress Test Technologist: Milana Na, EMT-P  Nuclear Technologist:  Domenic Polite, CNMT     Rest Procedure:  Myocardial perfusion imaging was performed at rest 45 minutes  following the intravenous administration of Technetium 25m Tetrofosmin. Rest ECG: Atrial Fibrilliation  Stress Procedure:  The patient received IV Lexiscan 0.4 mg over 15-seconds.  Technetium 28m Tetrofosmin injected at 30-seconds.  There were no significant changes, + sob, abdominal pain, nausea, and rare pvcs with Lexiscan.  Quantitative spect images were obtained after a 45 minute delay. Stress ECG: No significant change from baseline ECG  QPS Raw Data Images:  Normal; no motion artifact; normal heart/lung ratio. Stress Images:  Normal homogeneous uptake in all areas of the myocardium. Rest Images:  Normal homogeneous uptake in all areas of the myocardium. Subtraction (SDS):  Normal Transient Ischemic Dilatation (Normal <1.22):  1.11 Lung/Heart Ratio (Normal <0.45):  0.34  Quantitative Gated Spect Images QGS EDV:  77 ml QGS ESV:  19 ml  Impression Exercise Capacity:  Lexiscan with no exercise. BP Response:  Normal blood pressure response. Clinical Symptoms:  There is dyspnea. ECG Impression:  Baseline ECG AFib with LVH and inferolateral T wave inversions Comparison with Prior Nuclear Study: No images to compare  Overall Impression:  Normal stress nuclear study.  LV Ejection Fraction: 75%.  LV Wall Motion:  NL LV Function; NL Wall Motion   Charlton Haws

## 2011-07-05 ENCOUNTER — Other Ambulatory Visit: Payer: Self-pay

## 2011-07-05 ENCOUNTER — Ambulatory Visit (HOSPITAL_BASED_OUTPATIENT_CLINIC_OR_DEPARTMENT_OTHER): Payer: Medicare Other

## 2011-07-05 ENCOUNTER — Ambulatory Visit (HOSPITAL_COMMUNITY): Payer: Medicare Other | Attending: Cardiovascular Disease | Admitting: Radiology

## 2011-07-05 DIAGNOSIS — R609 Edema, unspecified: Secondary | ICD-10-CM | POA: Diagnosis not present

## 2011-07-05 DIAGNOSIS — I4891 Unspecified atrial fibrillation: Secondary | ICD-10-CM | POA: Diagnosis not present

## 2011-07-05 DIAGNOSIS — R079 Chest pain, unspecified: Secondary | ICD-10-CM

## 2011-07-05 DIAGNOSIS — I2510 Atherosclerotic heart disease of native coronary artery without angina pectoris: Secondary | ICD-10-CM | POA: Diagnosis not present

## 2011-07-05 DIAGNOSIS — G4730 Sleep apnea, unspecified: Secondary | ICD-10-CM | POA: Diagnosis not present

## 2011-07-05 DIAGNOSIS — R0989 Other specified symptoms and signs involving the circulatory and respiratory systems: Secondary | ICD-10-CM

## 2011-07-05 DIAGNOSIS — I379 Nonrheumatic pulmonary valve disorder, unspecified: Secondary | ICD-10-CM | POA: Insufficient documentation

## 2011-07-05 DIAGNOSIS — I509 Heart failure, unspecified: Secondary | ICD-10-CM

## 2011-07-05 DIAGNOSIS — R0600 Dyspnea, unspecified: Secondary | ICD-10-CM

## 2011-07-05 DIAGNOSIS — I079 Rheumatic tricuspid valve disease, unspecified: Secondary | ICD-10-CM | POA: Diagnosis not present

## 2011-07-05 DIAGNOSIS — E785 Hyperlipidemia, unspecified: Secondary | ICD-10-CM | POA: Diagnosis not present

## 2011-07-05 DIAGNOSIS — E669 Obesity, unspecified: Secondary | ICD-10-CM | POA: Insufficient documentation

## 2011-07-05 DIAGNOSIS — R0609 Other forms of dyspnea: Secondary | ICD-10-CM | POA: Diagnosis not present

## 2011-07-05 DIAGNOSIS — I10 Essential (primary) hypertension: Secondary | ICD-10-CM | POA: Diagnosis not present

## 2011-07-05 MED ORDER — TECHNETIUM TC 99M-LABELED RED BLOOD CELLS IV KIT
32.8000 | PACK | Freq: Once | INTRAVENOUS | Status: AC | PRN
  Administered 2011-07-05: 32.8 via INTRAVENOUS

## 2011-07-21 ENCOUNTER — Other Ambulatory Visit: Payer: Self-pay | Admitting: Cardiology

## 2011-07-26 ENCOUNTER — Ambulatory Visit (INDEPENDENT_AMBULATORY_CARE_PROVIDER_SITE_OTHER): Payer: Medicare Other | Admitting: Cardiology

## 2011-07-26 ENCOUNTER — Encounter: Payer: Self-pay | Admitting: Cardiology

## 2011-07-26 ENCOUNTER — Ambulatory Visit (INDEPENDENT_AMBULATORY_CARE_PROVIDER_SITE_OTHER): Payer: Medicare Other | Admitting: *Deleted

## 2011-07-26 VITALS — BP 137/68 | HR 80 | Ht 65.0 in | Wt 268.0 lb

## 2011-07-26 DIAGNOSIS — I509 Heart failure, unspecified: Secondary | ICD-10-CM

## 2011-07-26 DIAGNOSIS — I10 Essential (primary) hypertension: Secondary | ICD-10-CM

## 2011-07-26 DIAGNOSIS — Z7901 Long term (current) use of anticoagulants: Secondary | ICD-10-CM | POA: Diagnosis not present

## 2011-07-26 DIAGNOSIS — I5032 Chronic diastolic (congestive) heart failure: Secondary | ICD-10-CM | POA: Diagnosis not present

## 2011-07-26 DIAGNOSIS — I4891 Unspecified atrial fibrillation: Secondary | ICD-10-CM

## 2011-07-26 DIAGNOSIS — I2510 Atherosclerotic heart disease of native coronary artery without angina pectoris: Secondary | ICD-10-CM

## 2011-07-26 DIAGNOSIS — E669 Obesity, unspecified: Secondary | ICD-10-CM

## 2011-07-26 NOTE — Progress Notes (Signed)
HPI The patient presents for evaluation of dyspnea.  She has had continued SOB that has been slowly progressive.  She reports that she has been having dyspnea with mild activity such as walking 10 yards on level ground.  She does not report PND or orthopnea.  She does not have chest/neck or arm pain.  She did recently have weight gain and was treated with a higher dose of Lasix at her last visit. She took this for several days and did lose about 9 lbs although she has gained back about 4.  She did have an echo which demonstrated a normal EF. There were no significant valvular abnormalities.  She had a Lexiscan Myoview that did not demonstrated ischemia or infarct.    She is now quite limited by her dyspnea.  We reviewed her history of atrial fibrillation.  She, in retrospect, thinks that she might have felt better when she was in atrial fib in the past.  She maintained NSR for a couple of years on Tikosyn after DCCV.  However, when she converted back to fib we discussed whether there was a true symptomatic benefit and we opted not to pursue another DCCV.  She stopped Tikosyn and has been managed with rate control and anticoagulation.  She does not feel palpitations and she has had no syncope or presyncope.  Allergies  Allergen Reactions  . Rosuvastatin     REACTION: muscle and joint pain    Current Outpatient Prescriptions  Medication Sig Dispense Refill  . diltiazem (CARDIZEM CD) 180 MG 24 hr capsule Take 1 capsule (180 mg total) by mouth daily.  90 capsule  3  . furosemide (LASIX) 40 MG tablet Take 40 mg twice daily for 1 week then decrease to 40 mg daily  40 tablet  3  . magnesium oxide (MAG-OX) 400 MG tablet Take 400 mg by mouth daily.       . metoprolol (LOPRESSOR) 50 MG tablet TAKE 1 AND 1/2 TABLETS TWICE DAILY  270 tablet  3  . potassium chloride SA (K-DUR,KLOR-CON) 20 MEQ tablet TAKE 2 TABLETS DAILY  180 tablet  2  . warfarin (COUMADIN) 5 MG tablet Take 1 tablet (5 mg total) by mouth as  directed. Take as directed by coumadin clinic. This is a ninety day supply.  70 tablet  1    Past Medical History  Diagnosis Date  . Hyperlipidemia   . Hypertension   . Osteoporosis   . Atrial fibrillation     a. s/p multiple cardioversions;  b. previously on Tikosyn - ineffective;  c. Rate controlled & on coumadin  . CAD (coronary artery disease) 07/2008    a. 07/2008 NSTEMI ->Cath: LM nl, LAD 70p/90p, 28m, LCX nl, RCA nl, EF 60%.  The LAD was stented with a 2.25x63mm Veri-Flex BMS  . Duodenitis   . Headache   . Chronic diastolic CHF (congestive heart failure)     a. 07/2008 Echo: EF 60-65%, Triv AI/MR, Mild TR  . Morbid obesity     Past Surgical History  Procedure Date  . Cholecystectomy   . Abdominal hysterectomy     partial, bleeding  . Knee arthroscopy 2000 & 2001    left and right  . Esophagogastroduodenoscopy 2003  . Cataract extraction   . Appendectomy   . Tubal ligation   . Total knee arthroplasty     bilateral    Family History  Problem Relation Age of Onset  . Other Mother     GI Bleed  .  Pneumonia Father   . Coronary artery disease Brother     History   Social History  . Marital Status: Married    Spouse Name: N/A    Number of Children: 2  . Years of Education: N/A   Occupational History  .     Social History Main Topics  . Smoking status: Never Smoker   . Smokeless tobacco: Never Used  . Alcohol Use: No  . Drug Use: No  . Sexually Active: Not on file   Other Topics Concern  . Not on file   Social History Narrative  . No narrative on file    ROS:   As stated in the HPI and negative for all other systems.   PHYSICAL EXAM BP 137/68  Pulse 80  Ht 5\' 5"  (1.651 m)  Wt 268 lb (121.564 kg)  BMI 44.60 kg/m2 GENERAL:  Well appearing HEENT:  Pupils equal round and reactive, fundi not visualized, oral mucosa unremarkable NECK:  No jugular venous distention, waveform within normal limits, carotid upstroke brisk and symmetric, no bruits, no  thyromegaly LYMPHATICS:  No cervical, inguinal adenopathy LUNGS:  Clear to auscultation bilaterally BACK:  No CVA tenderness CHEST:  Unremarkable HEART:  PMI not displaced or sustained,S1 and S2 within normal limits, no S3, no clicks, no rubs, no murmurs, irregular ABD:  Flat, positive bowel sounds normal in frequency in pitch, no bruits, no rebound, no guarding, no midline pulsatile mass, no hepatomegaly, no splenomegaly, obese EXT:  2 plus pulses throughout, no edema, no cyanosis no clubbing SKIN:  No rashes no nodules NEURO:  Cranial nerves II through XII grossly intact, motor grossly intact throughout PSYCH:  Cognitively intact, oriented to person place and time   ASSESSMENT AND PLAN

## 2011-07-26 NOTE — Patient Instructions (Addendum)
The current medical regimen is effective;  continue present plan and medications.  You will be contact to be admitted for Tikosyn and cardioversion.  [facility] Diet

## 2011-07-27 NOTE — Assessment & Plan Note (Signed)
She will continue with risk reduction. Meds will be managed as above. She's having no ongoing angina. 

## 2011-07-27 NOTE — Assessment & Plan Note (Signed)
The patient understands the need to lose weight with diet and exercise. We have discussed specific strategies for this.  

## 2011-07-27 NOTE — Assessment & Plan Note (Signed)
This has been a difficult issue. It is unclear whether fibrillation is contributing to her dyspnea. However, at this point I will proceed with hospitalization, readmitting her for Tikosyn loading and probable cardioversion. She has been on anticoagulation on interrupted.

## 2011-07-27 NOTE — Assessment & Plan Note (Signed)
She seems to be euvolemic.  At this point, no change in therapy is indicated.  We have reviewed salt and fluid restrictions.  No further cardiovascular testing is indicated.   

## 2011-07-27 NOTE — Assessment & Plan Note (Signed)
The blood pressure is at target. No change in medications is indicated. We will continue with therapeutic lifestyle changes (TLC).  

## 2011-08-22 ENCOUNTER — Ambulatory Visit (INDEPENDENT_AMBULATORY_CARE_PROVIDER_SITE_OTHER): Payer: Medicare Other | Admitting: Pharmacist

## 2011-08-22 ENCOUNTER — Encounter (INDEPENDENT_AMBULATORY_CARE_PROVIDER_SITE_OTHER): Payer: Medicare Other

## 2011-08-22 VITALS — BP 128/72 | HR 69 | Ht 65.0 in | Wt 274.0 lb

## 2011-08-22 DIAGNOSIS — I4891 Unspecified atrial fibrillation: Secondary | ICD-10-CM

## 2011-08-22 DIAGNOSIS — Z7901 Long term (current) use of anticoagulants: Secondary | ICD-10-CM

## 2011-08-22 LAB — BASIC METABOLIC PANEL
BUN: 21 mg/dL (ref 6–23)
GFR: 47.01 mL/min — ABNORMAL LOW (ref >60.00)
Glucose, Bld: 102 mg/dL — ABNORMAL HIGH (ref 70–99)
Potassium: 3.8 mEq/L (ref 3.5–5.1)

## 2011-08-23 ENCOUNTER — Inpatient Hospital Stay (HOSPITAL_COMMUNITY)
Admission: AD | Admit: 2011-08-23 | Discharge: 2011-08-27 | DRG: 309 | Disposition: A | Discharge status: Home / Self Care | Payer: Medicare Other | Source: Ambulatory Visit | Attending: Cardiology | Admitting: Cardiology

## 2011-08-23 ENCOUNTER — Other Ambulatory Visit (INDEPENDENT_AMBULATORY_CARE_PROVIDER_SITE_OTHER): Payer: Medicare Other

## 2011-08-23 ENCOUNTER — Other Ambulatory Visit: Payer: Self-pay | Admitting: Pharmacist

## 2011-08-23 ENCOUNTER — Encounter (HOSPITAL_COMMUNITY): Payer: Self-pay | Admitting: General Practice

## 2011-08-23 DIAGNOSIS — Z9861 Coronary angioplasty status: Secondary | ICD-10-CM

## 2011-08-23 DIAGNOSIS — Z96659 Presence of unspecified artificial knee joint: Secondary | ICD-10-CM | POA: Diagnosis not present

## 2011-08-23 DIAGNOSIS — I509 Heart failure, unspecified: Secondary | ICD-10-CM | POA: Diagnosis present

## 2011-08-23 DIAGNOSIS — I4891 Unspecified atrial fibrillation: Principal | ICD-10-CM | POA: Diagnosis present

## 2011-08-23 DIAGNOSIS — I5032 Chronic diastolic (congestive) heart failure: Secondary | ICD-10-CM | POA: Diagnosis not present

## 2011-08-23 DIAGNOSIS — I252 Old myocardial infarction: Secondary | ICD-10-CM | POA: Diagnosis not present

## 2011-08-23 DIAGNOSIS — Z7901 Long term (current) use of anticoagulants: Secondary | ICD-10-CM

## 2011-08-23 DIAGNOSIS — I4819 Other persistent atrial fibrillation: Secondary | ICD-10-CM | POA: Diagnosis present

## 2011-08-23 DIAGNOSIS — Z6841 Body Mass Index (BMI) 40.0 and over, adult: Secondary | ICD-10-CM | POA: Diagnosis not present

## 2011-08-23 DIAGNOSIS — G4730 Sleep apnea, unspecified: Secondary | ICD-10-CM | POA: Diagnosis present

## 2011-08-23 DIAGNOSIS — Z780 Asymptomatic menopausal state: Secondary | ICD-10-CM | POA: Diagnosis not present

## 2011-08-23 DIAGNOSIS — I2510 Atherosclerotic heart disease of native coronary artery without angina pectoris: Secondary | ICD-10-CM | POA: Diagnosis present

## 2011-08-23 DIAGNOSIS — I10 Essential (primary) hypertension: Secondary | ICD-10-CM | POA: Diagnosis present

## 2011-08-23 DIAGNOSIS — M810 Age-related osteoporosis without current pathological fracture: Secondary | ICD-10-CM | POA: Diagnosis present

## 2011-08-23 DIAGNOSIS — M1990 Unspecified osteoarthritis, unspecified site: Secondary | ICD-10-CM | POA: Diagnosis present

## 2011-08-23 DIAGNOSIS — Z79899 Other long term (current) drug therapy: Secondary | ICD-10-CM | POA: Diagnosis not present

## 2011-08-23 DIAGNOSIS — E785 Hyperlipidemia, unspecified: Secondary | ICD-10-CM | POA: Diagnosis present

## 2011-08-23 DIAGNOSIS — G4733 Obstructive sleep apnea (adult) (pediatric): Secondary | ICD-10-CM | POA: Diagnosis present

## 2011-08-23 HISTORY — DX: Personal history of other medical treatment: Z92.89

## 2011-08-23 HISTORY — DX: Unspecified osteoarthritis, unspecified site: M19.90

## 2011-08-23 LAB — BASIC METABOLIC PANEL
BUN: 21 mg/dL (ref 6–23)
Calcium: 9.4 mg/dL (ref 8.4–10.5)
GFR: 46.56 mL/min — ABNORMAL LOW (ref >60.00)
Potassium: 4.3 mEq/L (ref 3.5–5.1)
Sodium: 140 mEq/L (ref 135–145)

## 2011-08-23 LAB — PROTIME-INR
INR: 2.32 — ABNORMAL HIGH (ref 0.00–1.49)
Prothrombin Time: 25.9 seconds — ABNORMAL HIGH (ref 11.6–15.2)

## 2011-08-23 MED ORDER — WARFARIN SODIUM 2.5 MG PO TABS
2.5000 mg | ORAL_TABLET | Freq: Every day | ORAL | Status: DC
  Administered 2011-08-23 – 2011-08-24 (×2): 2.5 mg via ORAL
  Filled 2011-08-23 (×3): qty 1

## 2011-08-23 MED ORDER — METOPROLOL TARTRATE 50 MG PO TABS
75.0000 mg | ORAL_TABLET | Freq: Two times a day (BID) | ORAL | Status: DC
  Administered 2011-08-23 – 2011-08-27 (×8): 75 mg via ORAL
  Filled 2011-08-23 (×11): qty 1

## 2011-08-23 MED ORDER — ALPRAZOLAM 0.25 MG PO TABS
0.2500 mg | ORAL_TABLET | Freq: Two times a day (BID) | ORAL | Status: DC | PRN
  Administered 2011-08-24 – 2011-08-26 (×2): 0.25 mg via ORAL
  Filled 2011-08-23 (×2): qty 1

## 2011-08-23 MED ORDER — ZOLPIDEM TARTRATE 5 MG PO TABS: 5.0000 mg | ORAL_TABLET | Freq: Every evening | ORAL | Status: DC | PRN

## 2011-08-23 MED ORDER — WARFARIN SODIUM 2.5 MG PO TABS
2.5000 mg | ORAL_TABLET | Freq: Every day | ORAL | Status: DC
  Filled 2011-08-23: qty 1

## 2011-08-23 MED ORDER — NITROGLYCERIN 0.4 MG SL SUBL: 0.4000 mg | SUBLINGUAL_TABLET | SUBLINGUAL | Status: DC | PRN

## 2011-08-23 MED ORDER — DOFETILIDE 500 MCG PO CAPS
500.0000 ug | ORAL_CAPSULE | Freq: Two times a day (BID) | ORAL | Status: DC
  Administered 2011-08-23 – 2011-08-27 (×8): 500 ug via ORAL
  Filled 2011-08-23 (×10): qty 1

## 2011-08-23 MED ORDER — WARFARIN SODIUM 2.5 MG PO TABS: 2.5000 mg | ORAL_TABLET | Freq: Every evening | ORAL | Status: DC

## 2011-08-23 MED ORDER — POTASSIUM CHLORIDE CRYS ER 20 MEQ PO TBCR
40.0000 meq | EXTENDED_RELEASE_TABLET | Freq: Every day | ORAL | Status: DC
  Administered 2011-08-24 – 2011-08-27 (×4): 40 meq via ORAL
  Filled 2011-08-23 (×5): qty 2

## 2011-08-23 MED ORDER — MAGNESIUM OXIDE 400 (241.3 MG) MG PO TABS
400.0000 mg | ORAL_TABLET | Freq: Every day | ORAL | Status: DC
  Administered 2011-08-23 – 2011-08-26 (×4): 400 mg via ORAL
  Filled 2011-08-23 (×6): qty 1

## 2011-08-23 MED ORDER — WARFARIN - PHARMACIST DOSING INPATIENT: Freq: Every day | Status: DC

## 2011-08-23 MED ORDER — ACETAMINOPHEN 325 MG PO TABS
650.0000 mg | ORAL_TABLET | ORAL | Status: DC | PRN
  Administered 2011-08-25 – 2011-08-26 (×2): 650 mg via ORAL
  Filled 2011-08-23 (×2): qty 2

## 2011-08-23 MED ORDER — ONDANSETRON HCL 4 MG/2ML IJ SOLN: 4.0000 mg | Freq: Four times a day (QID) | INTRAMUSCULAR | Status: DC | PRN

## 2011-08-23 MED ORDER — MAGNESIUM OXIDE 400 MG PO TABS: 400.0000 mg | ORAL_TABLET | Freq: Every day | ORAL | Status: DC

## 2011-08-23 MED ORDER — OFF THE BEAT BOOK
Freq: Once | Status: AC
  Administered 2011-08-23: 17:00:00
  Filled 2011-08-23: qty 1

## 2011-08-23 NOTE — Progress Notes (Signed)
HPI  [name] is a 66 yo F who is seen for Dr. Antoine Poche for Tikosyn initiation.  She was seen by Dr. Antoine Poche on 6/20 and complained of continued SOB that was progressively worsening.  Echo and Lexiscan myoview were normal.  Pt was in atrial fibrillation and discussed options for converting to NSR to help SOB.  Pt is currently just rate controlled with diltiazem and metoprolol.  She has taking Tikosyn in the past and was rhythm controlled for several years.  It was decided to pursue this option again.   Discussed Tikosyn with patient.  She is aware of the importance of compliance.  When she took Tikosyn before, she did not have any side effects, so would not anticipate any problems tolerating medication with this trial.  She was able to afford Tikosyn previously with the same insurance plan.    Reviewed medication list.  No QTc prolongating medications or contraindicated medications noted.  She has been appropriately anticoagulated for >4 weeks with warfarin.    EKG: Atrial fibrillation.  Vent. Rate- 69; QTc-  Current Outpatient Prescriptions  Medication Sig Dispense Refill  . diltiazem (CARDIZEM CD) 180 MG 24 hr capsule Take 1 capsule (180 mg total) by mouth daily.  90 capsule  3  . furosemide (LASIX) 40 MG tablet Take 40 mg by mouth daily.      . magnesium oxide (MAG-OX) 400 MG tablet Take 400 mg by mouth daily.       . metoprolol (LOPRESSOR) 50 MG tablet TAKE 1 AND 1/2 TABLETS TWICE DAILY  270 tablet  3  . potassium chloride SA (K-DUR,KLOR-CON) 20 MEQ tablet TAKE 2 TABLETS DAILY  180 tablet  2  . warfarin (COUMADIN) 5 MG tablet Take 1 tablet (5 mg total) by mouth as directed. Take as directed by coumadin clinic. This is a ninety day supply.  70 tablet  1    Allergies  Allergen Reactions  . Rosuvastatin     REACTION: muscle and joint pain

## 2011-08-23 NOTE — Progress Notes (Addendum)
ANTICOAGULATION CONSULT NOTE - Initial Consult  Pharmacy Consult for Coumadin Indication: atrial fibrillation  Allergies  Allergen Reactions  . Rosuvastatin Other (See Comments)    REACTION: muscle and joint pain    Patient Measurements: Height: 5\' 5"  (165.1 cm) Weight: 275 lb 14.4 oz (125.147 kg) IBW/kg (Calculated) : 57  Heparin Dosing Weight:   Vital Signs: Temp: 98 F (36.7 C) (07/18 1541) BP: 151/84 mmHg (07/18 1541) Pulse Rate: 83  (07/18 1541)  Labs:  Basename 08/23/11 0931 08/22/11 0939 08/22/11 0856  HGB -- -- --  HCT -- -- --  PLT -- -- --  APTT -- -- --  LABPROT -- -- --  INR -- 3.2 --  HEPARINUNFRC -- -- --  CREATININE 1.2 -- 1.2  CKTOTAL -- -- --  CKMB -- -- --  TROPONINI -- -- --    Estimated Creatinine Clearance: 59.6 ml/min (by C-G formula based on Cr of 1.2).   Medical History: Past Medical History  Diagnosis Date  . Hyperlipidemia   . Hypertension   . Osteoporosis   . Atrial fibrillation     a. s/p multiple cardioversions;  b. previously on Tikosyn - ineffective;  c. Rate controlled & on coumadin  . CAD (coronary artery disease) 07/2008    a. 07/2008 NSTEMI ->Cath: LM nl, LAD 70p/90p, 73m, LCX nl, RCA nl, EF 60%.  The LAD was stented with a 2.25x74mm Veri-Flex BMS  . Duodenitis   . Headache   . Chronic diastolic CHF (congestive heart failure)     a. 07/2008 Echo: EF 60-65%, Triv AI/MR, Mild TR  . Morbid obesity   . Shortness of breath   . Difficult intubation   . Myocardial infarction   . Tuberculosis     hx of positive skin test  . Arthritis     Medications:  Scheduled:    . magnesium oxide  400 mg Oral QHS  . metoprolol  75 mg Oral BID  . off the beat book   Does not apply Once  . potassium chloride SA  40 mEq Oral Daily  . DISCONTD: magnesium oxide  400 mg Oral QHS  . DISCONTD: warfarin  2.5 mg Oral QPM    Assessment: 66yo female with AFib, admitted for Tikosyn loading.  She has been on Coumadin 2.5mg  daily with INR = 3.2  on 7/17 & was instructed to hold dose yesterday then resume 2.5mg  daily.  No baseline INR is available.  Expect will be able to continue home dose  Goal of Therapy:  INR 2-3   Plan:  1.  Baseline INR 2.  Daily INR 3.  Coumadin 2.5mg  daily- schedule late & adjust as needed pending INR result.  Marisue Humble, PharmD Clinical Pharmacist [facility]

## 2011-08-23 NOTE — Assessment & Plan Note (Addendum)
Reviewed labs for Tikosyn initiation.  INR supratherapeutic at 3.2 but acceptable for starting Tikosyn.  Magnesium okay at 1.9.  SCr- 1.2.  QTc acceptable at .  K slightly low at 3.8.  Will have patient take extra of potassium tonight and in AM and recheck labs in the morning.  If K >4.0 tomorrow, will plan on admission to the hospital.   7/18- Labs reviewed.  K increased to 4.3 and Mag 2.0.  Okay for admission.  Pt's CrCl>34mL/min, but she has previously been controlled on BID.  Would suggest starting at lower dose given her history rather than as suggested by renal function.

## 2011-08-23 NOTE — H&P (Signed)
History and Physical  Patient ID: [name] MRN: [id number], SOB: 1945-07-01 66 y.o. Date of Encounter: 08/23/2011, 6:10 PM  Primary Physician: Ruthe Mannan, MD Primary Cardiologist: Dr. Antoine Poche  Chief Complaint: Tikosyn load  HPI: 66 y.o. female w/ PMHx significant for atrial fibrillation, chronic diastolic CHF, HLD, HTN, and CAD who was admitted to [facility] on 08/23/2011 for Tikosyn loading.  In the past she had maintained NSR after DCCV (2010) and on Tikosyn therapy ( BID), however, she converted back to a.fib and was taken off Tikosyn. She did not want to pursue another DCCV and opted to be managed with rate control and anticoagulation. Recently she has had progressive dyspnea that was limiting her activities and felt related to her atrial fibrillation. Echo 07/05/11 revealed normal EF and Lexiscan Myoview 07/04/11 did not demonstrate ischemia or infarct. During her office visit with Dr. Antoine Poche on 07/26/11 it was felt she would benefit from readmission for Tikosyn loading in hopes that maintaining NSR would improve her dyspnea.  She presented to [facility] today in atrial fibrillation.   Past Medical History  Diagnosis Date  . Hyperlipidemia   . Hypertension   . Osteoporosis   . Atrial fibrillation     a. s/p multiple cardioversions;  b. previously on Tikosyn - ineffective;  c. Rate controlled & on coumadin  . CAD (coronary artery disease) 07/2008    a. 07/2008 NSTEMI ->Cath: LM nl, LAD 70p/90p, 104m, LCX nl, RCA nl, EF 60%.  The LAD was stented with a 2.25x68mm Veri-Flex BMS  . Duodenitis   . Headache   . Chronic diastolic CHF (congestive heart failure)     a. 07/2008 Echo: EF 60-65%, Triv AI/MR, Mild TR  . Morbid obesity   . Shortness of breath   . Difficult intubation   . Myocardial infarction   . Tuberculosis     hx of positive skin test  . Arthritis     07/05/11 - 2D Echo Study Conclusions: - Left ventricle: Small LV cavity with obliteration of  apex in systole. Inferobasal hypokinesis The cavity size was normal. Wall thickness was increased in a pattern of moderate LVH. The estimated ejection fraction was 55%. - Left atrium: The atrium was mildly dilated. - Atrial septum: No defect or patent foramen ovale was identified.  07/04/11 - Lexiscan Myoview Impression  Exercise Capacity: Lexiscan with no exercise.  BP Response: Normal blood pressure response.  Clinical Symptoms: There is dyspnea.  ECG Impression: Baseline ECG AFib with LVH and inferolateral T wave inversions  Comparison with Prior Nuclear Study: No images to compare  Overall Impression: Normal stress nuclear study.  LV Ejection Fraction: 75%. LV Wall Motion: NL LV Function; NL Wall Motion   Surgical History:  Past Surgical History  Procedure Date  . Cholecystectomy   . Abdominal hysterectomy     partial, bleeding  . Knee arthroscopy 2000 & 2001    left and right  . Esophagogastroduodenoscopy 2003  . Cataract extraction   . Appendectomy   . Tubal ligation   . Total knee arthroplasty     bilateral     Home Meds: Medication Sig  diltiazem (CARDIZEM CD) 180 MG 24 hr capsule Take 1 capsule (180 mg total) by mouth daily.  furosemide (LASIX) 40 MG tablet Take 40 mg by mouth daily.  magnesium oxide (MAG-OX) 400 MG tablet Take 400 mg by mouth daily.   metoprolol (LOPRESSOR) 50 MG tablet TAKE 1 AND 1/2 TABLETS TWICE DAILY  potassium  chloride SA (K-DUR,KLOR-CON) 20 MEQ tablet TAKE 2 TABLETS DAILY  warfarin (COUMADIN) 5 MG tablet Take 1 tablet (5 mg total) by mouth as directed. Take as directed by coumadin clinic. This is a ninety day supply.    Allergies:  Allergies  Allergen Reactions  . Rosuvastatin Other (See Comments)    REACTION: muscle and joint pain    History   Social History  . Marital Status: Married    Spouse Name: N/A    Number of Children: 2  . Years of Education: N/A   Occupational History  .     Social History Main Topics  . Smoking  status: Never Smoker   . Smokeless tobacco: Never Used  . Alcohol Use: No  . Drug Use: No  . Sexually Active: Not Currently    Birth Control/ Protection: Post-menopausal   Other Topics Concern  . Not on file   Social History Narrative  . No narrative on file     Family History  Problem Relation Age of Onset  . Other Mother     GI Bleed  . Pneumonia Father   . Coronary artery disease Brother     Review of Systems:  General: negative for chills, fever, night sweats or weight changes.  Cardiovascular: negative for chest pain, shortness of breath, dyspnea on exertion, edema, orthopnea, palpitations, paroxysmal nocturnal dyspnea  Dermatological: negative for rash apart from roasacea Respiratory: negative for cough or wheezing Urologic: negative for hematuria Abdominal: negative for nausea, vomiting, diarrhea, bright red blood per rectum, melena, or hematemesis Neurologic: negative for visual changes, syncope, or dizziness All other systems reviewed and are otherwise negative except as noted above.  Labs:     Lab 08/23/11 0931  NA 140  K 4.3  CL 106  CO2 25  BUN 21  CREATININE 1.2  CALCIUM 9.4  GLUCOSE 101*  MAGNESIUM 2.0     08/22/2011 09:39  INR 3.2    Radiology/Studies:  None     Physical Exam:  BP 151/84  Pulse 83  Temp 98 F (36.7 C)  Resp 18  Ht 5\' 5"  (1.651 m)  Wt 275 lb 14.4 oz (125.147 kg)  BMI 45.91 kg/m2  SpO2 96% General: Well developed, well nourished, in no acute distress.Morbidly obese Head: Normocephalic, atraumatic, sclera non-icteric, nares are without discharge Neck: Supple. Negative for carotid bruits. JVD not elevated. Lungs: Clear bilaterally to auscultation without wheezes, rales, or rhonchi. Breathing is unlabored. Back without scolisos or CVAT  Heart: Irregular rate and rhythm  No murmurs, rubs, or gallops appreciated. Abdomen: Soft, non-tender, non-distended with normoactive bowel sounds. No rebound/guarding. No obvious  abdominal masses. Msk:  Strength and tone appear normal for age. Extremities: Trace edema. No clubbing or cyanosis. Distal pedal pulses are 2+ and equal bilaterally. Neuro: Alert and oriented X 3. Moves all extremities spontaneously. Psych:  Responds to questions appropriately with a normal affect.   EKG shows QTc 380 with QTC 420 Labs significant for K+ 4.3, Mg 2.0, INR 3.2.     ASSESSMENT AND PLAN:  66 y.o. female w/ PMHx significant for atrial fibrillation, chronic diastolic CHF, HLD, HTN, and CAD who was admitted  Patient Active Hospital Problem List: HYPERTENSION (08/06/2006)   FIBRILLATION, ATRIAL (07/10/2006)   SLEEP APNEA, SEVERE (08/06/2006)   Chronic diastolic CHF (congestive heart failure) (06/26/2011)  * Admitted for Joice Lofts which previously had been unsuccessful  But symptoms have beenmuch worse since they decided to try rate control. Alternatives to Tikosyn could be considered subsequently  include amiodarone. Previously she was on 250 mcg twice daily; however, her creatinine clearance is [place] of 80  She was initiated at a dose of 500 mcg and had a non-STEMI 2 weeks later associated with further QT prolongation and her dose was done titrated. Because of that, we will anticipate resuming at 500 mcg and we'll see how she does.   Her past medical history notes sleep apnea; she has significant problems with daytime somnolence and with his strong interaction with atrial fibrillation I would recommend outpatient sleep study.     Signed, Sherryl Manges PA-C 08/23/2011, 6:10 PM

## 2011-08-24 DIAGNOSIS — Z6841 Body Mass Index (BMI) 40.0 and over, adult: Secondary | ICD-10-CM | POA: Diagnosis not present

## 2011-08-24 DIAGNOSIS — I509 Heart failure, unspecified: Secondary | ICD-10-CM

## 2011-08-24 DIAGNOSIS — I5032 Chronic diastolic (congestive) heart failure: Secondary | ICD-10-CM

## 2011-08-24 DIAGNOSIS — I4891 Unspecified atrial fibrillation: Secondary | ICD-10-CM | POA: Diagnosis not present

## 2011-08-24 LAB — BASIC METABOLIC PANEL
BUN: 20 mg/dL (ref 6–23)
Calcium: 9.6 mg/dL (ref 8.4–10.5)
Chloride: 103 mEq/L (ref 96–112)
Creatinine, Ser: 1.01 mg/dL (ref 0.50–1.10)
GFR calc Af Amer: 65 mL/min — ABNORMAL LOW (ref >90)
GFR calc non Af Amer: 56 mL/min — ABNORMAL LOW (ref >90)

## 2011-08-24 LAB — MAGNESIUM: Magnesium: 2 mg/dL (ref 1.5–2.5)

## 2011-08-24 LAB — PROTIME-INR: Prothrombin Time: 25 seconds — ABNORMAL HIGH (ref 11.6–15.2)

## 2011-08-24 MED ORDER — FUROSEMIDE 40 MG PO TABS
40.0000 mg | ORAL_TABLET | Freq: Every day | ORAL | Status: DC
  Administered 2011-08-24 – 2011-08-25 (×2): 40 mg via ORAL
  Filled 2011-08-24 (×3): qty 1

## 2011-08-24 NOTE — Care Management Note (Signed)
    Page 1 of 1   08/27/2011     12:17:23 PM   CARE MANAGEMENT NOTE 08/27/2011  Patient:  [name], [name]   Account Number:  [id number]  Date Initiated:  08/24/2011  Documentation initiated by:  GRAVES-BIGELOW,Kahlyn Shippey  Subjective/Objective Assessment:   Pt amitted for Tikosyn Load. Has family support. Pt uses [facility] in [place] and medication is available. Co pay will be 101.28 and CM will make pt aware.     Action/Plan:   MD please write  Rx for 7 day supply of Tikosyn to be filled in themain pharmacy at d/c and then the original Rx. Weekend CM to assist with 7 day supply.   Anticipated DC Date:  08/26/2011   Anticipated DC Plan:  HOME/SELF CARE      DC Planning Services  CM consult      Choice offered to / List presented to:             Status of service:  Completed, signed off Medicare Important Message given?   (If response is "NO", the following Medicare IM given date fields will be blank) Date Medicare IM given:   Date Additional Medicare IM given:    Discharge Disposition:  HOME/SELF CARE  Per UR Regulation:  Reviewed for med. necessity/level of care/duration of stay  If discussed at Long Length of Stay Meetings, dates discussed:    Comments:  08-27-11 1215 Tomi Bamberger, RN,BSN [phone] CM did send Rx to main pharmacy for assistance with the tikosyn fund. No further services from CM at this time.

## 2011-08-24 NOTE — Progress Notes (Signed)
   SUBJECTIVE:  No SOB.  No chest pain   PHYSICAL EXAM Filed Vitals:   08/23/11 1541 08/24/11 0500  BP: 151/84 131/75  Pulse: 83 96  Temp: 98 F (36.7 C) 98.4 F (36.9 C)  TempSrc:  Oral  Resp: 18 18  Height: 5\' 5"  (1.651 m)   Weight: 275 lb 14.4 oz (125.147 kg) 279 lb (126.554 kg)  SpO2: 96% 95%   General:  No distress Lungs:  Clear Heart:  Irregular Abdomen:  Obese Extremities:  No edema  LABS:  Results for orders placed during the hospital encounter of 08/23/11 (from the past 24 hour(s))  PROTIME-INR     Status: Abnormal   Collection Time   08/23/11  6:38 PM      Component Value Range   Prothrombin Time 25.9 (*) 11.6 - 15.2 seconds   INR 2.32 (*) 0.00 - 1.49  MAGNESIUM     Status: Normal   Collection Time   08/24/11  7:08 AM      Component Value Range   Magnesium 2.0  1.5 - 2.5 mg/dL  BASIC METABOLIC PANEL     Status: Abnormal   Collection Time   08/24/11  7:08 AM      Component Value Range   Sodium 138  135 - 145 mEq/L   Potassium 4.0  3.5 - 5.1 mEq/L   Chloride 103  96 - 112 mEq/L   CO2 22  19 - 32 mEq/L   Glucose, Bld 99  70 - 99 mg/dL   BUN 20  6 - 23 mg/dL   Creatinine, Ser 4.09  0.50 - 1.10 mg/dL   Calcium 9.6  8.4 - 81.1 mg/dL   GFR calc non Af Amer 56 (*) >90 mL/min   GFR calc Af Amer 65 (*) >90 mL/min  PROTIME-INR     Status: Abnormal   Collection Time   08/24/11  7:08 AM      Component Value Range   Prothrombin Time 25.0 (*) 11.6 - 15.2 seconds   INR 2.22 (*) 0.00 - 1.49     Telemetry:    Atrial fibrillation.  QTC  467   ASSESSMENT AND PLAN:   FIBRILLATION, ATRIAL:  Continue Tikosyn.  Probable DCCV Sunday AM.  I will make arrangements through our staff.  Note the QT was difficult to see on EKG.  I reviewed the telemetry and calculated the QTc based on telemetry leads.   HYPERTENSION:  Continue current therapy   SLEEP APNEA, SEVERE:  We will follow up on this as an outpatient.   Chronic diastolic CHF (congestive heart failure):   Euvolemic.  No change in therapy.   Morbid obesity:  I have and will continue to educate.    Rollene Rotunda 08/24/2011 9:36 AM

## 2011-08-24 NOTE — Progress Notes (Signed)
ANTICOAGULATION CONSULT NOTE - Follow Up Consult  Pharmacy Consult for coumadin Indication: atrial fibrillation  Allergies  Allergen Reactions  . Rosuvastatin Other (See Comments)    REACTION: muscle and joint pain    Patient Measurements: Height: 5\' 5"  (165.1 cm) Weight: 279 lb (126.554 kg) IBW/kg (Calculated) : 57    Vital Signs: Temp: 98.4 F (36.9 C) (07/19 0500) Temp src: Oral (07/19 0500) BP: 131/75 mmHg (07/19 0500) Pulse Rate: 96  (07/19 0500)  Labs:  [name] 08/24/11 0708 08/23/11 1838 08/23/11 0931 08/22/11 0939 08/22/11 0856  HGB -- -- -- -- --  HCT -- -- -- -- --  PLT -- -- -- -- --  APTT -- -- -- -- --  LABPROT 25.0* 25.9* -- -- --  INR 2.22* 2.32* -- 3.2 --  HEPARINUNFRC -- -- -- -- --  CREATININE 1.01 -- 1.2 -- 1.2  CKTOTAL -- -- -- -- --  CKMB -- -- -- -- --  TROPONINI -- -- -- -- --    Estimated Creatinine Clearance: 71.4 ml/min (by C-G formula based on Cr of 1.01).  Assessment: Patient is a 66 y.o F on coumadin  For Afib.  INR is at goal today.  No bleeding noted.  Goal of Therapy:  INR 2-3  Plan:  1) continue coumadin 2.5mg  daily  Ahman Dugdale P 08/24/2011,9:01 AM

## 2011-08-24 NOTE — Clinical Documentation Improvement (Signed)
BMI DOCUMENTATION CLARIFICATION QUERY  THIS DOCUMENT IS NOT A PERMANENT PART OF THE MEDICAL RECORD  TO RESPOND TO THE THIS QUERY, FOLLOW THE INSTRUCTIONS BELOW:  1. If needed, update documentation for the patient's encounter via the notes activity.  2. Access this query again and click edit on the In [employer].  3. After updating, or not, click F2 to complete all highlighted (required) fields concerning your review. Select "additional documentation in the medical record" OR "no additional documentation provided".  4. Click Sign note button.  5. The deficiency will fall out of your In Basket *Please let us know if you are not able to complete this workflow by phone or e-mail (listed below).         08/24/11  Dear Dr. Antoine Poche  [name]  In an effort to better capture your patient's severity of illness/SOI, reflect appropriate length of stay and utilization of resources, a review of the patient medical record has revealed the following indicators.  THANK YOU.  Possible Clinical conditions - Morbid Obesity=  BMI of > or = 40 - Other Condition (please specify) - Cannot Clinically Determine  Supporting Information - Patient's BMI = 46 per 7/18 Doc Flowsheet    Reviewed: additional documentation in the medical record  Thank You,  Beverley Fiedler RN Clinical Documentation Specialist: Pager: [phone] Health Information Management: [phone] [facility]

## 2011-08-24 NOTE — Progress Notes (Signed)
UR Completed Yonas Bunda Graves-Bigelow, RN,BSN [phone]  

## 2011-08-25 DIAGNOSIS — I5032 Chronic diastolic (congestive) heart failure: Secondary | ICD-10-CM | POA: Diagnosis not present

## 2011-08-25 DIAGNOSIS — I509 Heart failure, unspecified: Secondary | ICD-10-CM | POA: Diagnosis not present

## 2011-08-25 DIAGNOSIS — I4891 Unspecified atrial fibrillation: Secondary | ICD-10-CM

## 2011-08-25 DIAGNOSIS — Z6841 Body Mass Index (BMI) 40.0 and over, adult: Secondary | ICD-10-CM | POA: Diagnosis not present

## 2011-08-25 LAB — BASIC METABOLIC PANEL
BUN: 19 mg/dL (ref 6–23)
Creatinine, Ser: 1.05 mg/dL (ref 0.50–1.10)
GFR calc Af Amer: 62 mL/min — ABNORMAL LOW (ref >90)
GFR calc non Af Amer: 53 mL/min — ABNORMAL LOW (ref >90)
Glucose, Bld: 115 mg/dL — ABNORMAL HIGH (ref 70–99)
Potassium: 3.6 mEq/L (ref 3.5–5.1)

## 2011-08-25 LAB — MAGNESIUM: Magnesium: 1.9 mg/dL (ref 1.5–2.5)

## 2011-08-25 MED ORDER — SODIUM CHLORIDE 0.9 % IJ SOLN: 3.0000 mL | INTRAMUSCULAR | Status: DC | PRN

## 2011-08-25 MED ORDER — POTASSIUM CHLORIDE CRYS ER 20 MEQ PO TBCR
20.0000 meq | EXTENDED_RELEASE_TABLET | Freq: Once | ORAL | Status: AC
  Administered 2011-08-25: 20 meq via ORAL
  Filled 2011-08-25: qty 1

## 2011-08-25 MED ORDER — HYDROCORTISONE 1 % EX CREA: 1.0000 "application " | TOPICAL_CREAM | Freq: Three times a day (TID) | CUTANEOUS | Status: DC | PRN

## 2011-08-25 MED ORDER — SODIUM CHLORIDE 0.9 % IV SOLN: 250.0000 mL | INTRAVENOUS | Status: DC

## 2011-08-25 MED ORDER — WARFARIN SODIUM 3 MG PO TABS
3.0000 mg | ORAL_TABLET | Freq: Once | ORAL | Status: AC
  Administered 2011-08-25: 3 mg via ORAL
  Filled 2011-08-25 (×2): qty 1

## 2011-08-25 MED ORDER — SODIUM CHLORIDE 0.9 % IJ SOLN
3.0000 mL | Freq: Two times a day (BID) | INTRAMUSCULAR | Status: DC
  Administered 2011-08-25 – 2011-08-26 (×4): 3 mL via INTRAVENOUS

## 2011-08-25 NOTE — Progress Notes (Signed)
ANTICOAGULATION CONSULT NOTE - Follow Up Consult  Pharmacy Consult for coumadin Indication: atrial fibrillation  Allergies  Allergen Reactions  . Rosuvastatin Other (See Comments)    REACTION: muscle and joint pain    Patient Measurements: Height: 5\' 5"  (165.1 cm) Weight: 276 lb (125.193 kg) IBW/kg (Calculated) : 57    Vital Signs: Temp: 98 F (36.7 C) (07/20 0500) Temp src: Oral (07/20 0500) BP: 127/74 mmHg (07/20 0500) Pulse Rate: 97  (07/20 0500)  Labs:  [name] 08/25/11 0703 08/24/11 0708 08/23/11 1838 08/23/11 0931  HGB -- -- -- --  HCT -- -- -- --  PLT -- -- -- --  APTT -- -- -- --  LABPROT 23.5* 25.0* 25.9* --  INR 2.05* 2.22* 2.32* --  HEPARINUNFRC -- -- -- --  CREATININE 1.05 1.01 -- 1.2  CKTOTAL -- -- -- --  CKMB -- -- -- --  TROPONINI -- -- -- --    Estimated Creatinine Clearance: 68.2 ml/min (by C-G formula based on Cr of 1.05).  Assessment: Patient is a 66 y.o F on coumadin for Afib with plan for possible DCCV tomorrow.  INR is therapeutic but appears to be trending down.   Goal of Therapy:  INR 2-3   Plan:  1) Increase dose slightly to 3mg  today to make sure INR is therapeutic for cardioversion  [name] P 08/25/2011,9:44 AM

## 2011-08-25 NOTE — Progress Notes (Signed)
Subjective:  Doing fine.  Wants CV in am.  Back on Tikosyn  Objective:  Vital Signs in the last 24 hours: Temp:  [97.8 F (36.6 C)-98.7 F (37.1 C)] 98 F (36.7 C) (07/20 0500) Pulse Rate:  [57-97] 86  (07/20 1039) Resp:  [18] 18  (07/19 1400) BP: (126-128)/(69-83) 127/77 mmHg (07/20 1039) SpO2:  [96 %-97 %] 97 % (07/20 0500) Weight:  [276 lb (125.193 kg)] 276 lb (125.193 kg) (07/20 0500)  Intake/Output from previous day:     Physical Exam: General: Well developed, well nourished, in no acute distress. Head:  Normocephalic and atraumatic. Lungs: Clear to auscultation and percussion. Heart: Normal S1 and S2.  No murmur, rubs or gallops.  Neurologic: Alert and oriented x 3.    Lab Results: No results found for this basename: WBC:2,HGB:2,PLT:2 in the last 72 hours  Basename 08/25/11 0703 08/24/11 0708  NA 137 138  K 3.6 4.0  CL 99 103  CO2 25 22  GLUCOSE 115* 99  BUN 19 20  CREATININE 1.05 1.01   No results found for this basename: TROPONINI:2,CK,MB:2 in the last 72 hours Hepatic Function Panel No results found for this basename: PROT,ALBUMIN,AST,ALT,ALKPHOS,BILITOT,BILIDIR,IBILI in the last 72 hours No results found for this basename: CHOL in the last 72 hours No results found for this basename: PROTIME in the last 72 hours  Imaging: No results found.  EKG:  QTc .46 measured by me on telem.    Cardiac Studies:    Assessment/Plan:  Patient Active Hospital Problem List: HYPERTENSION (08/06/2006)   Assessment: under management   Plan: meds FIBRILLATION, ATRIAL (07/10/2006)   Assessment: for cardioversion am after tikosyn load   Plan: as noted SLEEP APNEA, SEVERE (08/06/2006)   Assessment: thinks she has    Plan: needs sleep study   She is already aware and prepared for CV in am.  Will put in orders.  Discussed with patient and husband    Shawnie Pons, MD, [facility], [facility] 08/25/2011, 11:47 AM

## 2011-08-26 ENCOUNTER — Encounter (HOSPITAL_COMMUNITY): Payer: Self-pay | Admitting: Certified Registered Nurse Anesthetist

## 2011-08-26 ENCOUNTER — Inpatient Hospital Stay (HOSPITAL_COMMUNITY): Payer: Medicare Other | Admitting: Certified Registered Nurse Anesthetist

## 2011-08-26 DIAGNOSIS — Z6841 Body Mass Index (BMI) 40.0 and over, adult: Secondary | ICD-10-CM | POA: Diagnosis not present

## 2011-08-26 DIAGNOSIS — I4891 Unspecified atrial fibrillation: Secondary | ICD-10-CM | POA: Diagnosis not present

## 2011-08-26 DIAGNOSIS — I5032 Chronic diastolic (congestive) heart failure: Secondary | ICD-10-CM | POA: Diagnosis not present

## 2011-08-26 DIAGNOSIS — I509 Heart failure, unspecified: Secondary | ICD-10-CM | POA: Diagnosis not present

## 2011-08-26 LAB — BASIC METABOLIC PANEL
Calcium: 10.2 mg/dL (ref 8.4–10.5)
GFR calc non Af Amer: 50 mL/min — ABNORMAL LOW (ref >90)
Glucose, Bld: 94 mg/dL (ref 70–99)
Potassium: 4.3 mEq/L (ref 3.5–5.1)
Sodium: 140 mEq/L (ref 135–145)

## 2011-08-26 LAB — PROTIME-INR
INR: 2.02 — ABNORMAL HIGH (ref 0.00–1.49)
Prothrombin Time: 23.2 seconds — ABNORMAL HIGH (ref 11.6–15.2)

## 2011-08-26 LAB — MAGNESIUM: Magnesium: 2.1 mg/dL (ref 1.5–2.5)

## 2011-08-26 MED ORDER — WARFARIN SODIUM 5 MG PO TABS
5.0000 mg | ORAL_TABLET | Freq: Once | ORAL | Status: DC
  Filled 2011-08-26: qty 1

## 2011-08-26 MED ORDER — PROPOFOL 10 MG/ML IV BOLUS
INTRAVENOUS | Status: DC | PRN
  Administered 2011-08-26: 100 mg via INTRAVENOUS

## 2011-08-26 NOTE — Progress Notes (Signed)
At 2337 pt had 6 beats of Vtach/ SVT. Pt denies symptoms. Pt had had 5 beats a little earlier and was asymptomatic. Dr Rogelia Boga on call made aware. No new orders and will cont to monitor.

## 2011-08-26 NOTE — Transfer of Care (Signed)
Immediate Anesthesia Transfer of Care Note  Patient: [name]  Procedure(s) Performed: * No procedures listed *  Patient Location: Nursing Unit 3014  Anesthesia Type: MAC  Level of Consciousness: awake, alert  and oriented  Airway & Oxygen Therapy: Patient Spontanous Breathing and Patient connected to nasal cannula oxygen  Post-op Assessment: Post -op Vital signs reviewed and stable and report given to Bosie Clos, RN   Post vital signs: Reviewed and stable  Complications: No apparent anesthesia complications

## 2011-08-26 NOTE — Progress Notes (Signed)
Subjective:  Feels good.  No complaints.  Says she is too busy for this.    Objective:  Vital Signs in the last 24 hours: Temp:  [97.7 F (36.5 C)-98.4 F (36.9 C)] 97.8 F (36.6 C) (07/21 0500) Pulse Rate:  [73-87] 83  (07/21 0500) Resp:  [18-20] 18  (07/21 0500) BP: (109-127)/(73-84) 118/73 mmHg (07/21 0500) SpO2:  [95 %] 95 % (07/21 0500) Weight:  [272 lb 7.8 oz (123.6 kg)] 272 lb 7.8 oz (123.6 kg) (07/21 0500)  Intake/Output from previous day: 07/20 0701 - 07/21 0700 In: 711 [P.O.:708; I.V.:3] Out: -    Physical Exam: General: Well developed, well nourished, in no acute distress. Head:  Normocephalic and atraumatic. Lungs: Clear to auscultation and percussion. Heart: irregularly irregular rhythm.   Pulses: Pulses normal in all 4 extremities. Extremities: No clubbing or cyanosis. No edema. Neurologic: Alert and oriented x 3.    Lab Results: No results found for this basename: WBC:2,HGB:2,PLT:2 in the last 72 hours  Basename 08/26/11 0520 08/25/11 0703  NA 140 137  K 4.3 3.6  CL 100 99  CO2 28 25  GLUCOSE 94 115*  BUN 21 19  CREATININE 1.11* 1.05   No results found for this basename: TROPONINI:2,CK,MB:2 in the last 72 hours Hepatic Function Panel No results found for this basename: PROT,ALBUMIN,AST,ALT,ALKPHOS,BILITOT,BILIDIR,IBILI in the last 72 hours No results found for this basename: CHOL in the last 72 hours No results found for this basename: PROTIME in the last 72 hours  Imaging: No results found.  EKG:  ECG yesterday consistent with atrial fibrillation.  QTC hard to measure but appears acceptable  INR just above 2.0 today   (normal dose 2.5mg )  Assessment/Plan:   FIBRILLATION, ATRIAL (07/10/2006)   Assessment: Plan today for cardioversion.  I explained the risks to her in detail and she consents to proceed   Plan: lytes are in order.  Will give warfarin 5mg  today.         Shawnie Pons, MD, [facility], [facility] 08/26/2011, 8:29 AM

## 2011-08-26 NOTE — Preoperative (Signed)
Beta Blockers   Reason not to administer Beta Blockers:Not Applicable 

## 2011-08-26 NOTE — CV Procedure (Signed)
Cardioversion  Performed at bedside.  Anesthesia provided by Dr.Joslin with 100mg  propofol.  100, 120, and 150 ws used for cardioversion.  Converted with third shock to NSR.  Tolerated well.   Now alert.  Tolerated well. Awaiting repeat ECG Plan dc tomorrow.  Will watch rhythm.  QTc does not appear prolonged.

## 2011-08-26 NOTE — Progress Notes (Signed)
ANTICOAGULATION CONSULT NOTE - Follow Up Consult  Pharmacy Consult for coumadin Indication: atrial fibrillation  Allergies  Allergen Reactions  . Rosuvastatin Other (See Comments)    REACTION: muscle and joint pain    Patient Measurements: Height: 5\' 5"  (165.1 cm) Weight: 272 lb 7.8 oz (123.6 kg) IBW/kg (Calculated) : 57    Vital Signs: Temp: 97.8 F (36.6 C) (07/21 0500) BP: 118/73 mmHg (07/21 0500) Pulse Rate: 83  (07/21 0500)  Labs:  [name] 08/26/11 0520 08/25/11 0703 08/24/11 0708  HGB -- -- --  HCT -- -- --  PLT -- -- --  APTT -- -- --  LABPROT 23.2* 23.5* 25.0*  INR 2.02* 2.05* 2.22*  HEPARINUNFRC -- -- --  CREATININE 1.11* 1.05 1.01  CKTOTAL -- -- --  CKMB -- -- --  TROPONINI -- -- --    Estimated Creatinine Clearance: 64 ml/min (by C-G formula based on Cr of 1.11).  Assessment: Patient is a 66 y.o F on coumadin for afib with plan for DCCV today.  INR is still therapeutic but appears to be trending down and is lower end of therapeutic range.    Goal of Therapy:  INR 2-3  Plan:  1) Will enter 5mg  x1 today as indicated per Dr. Rosalyn Charters note  Arlester Marker, Uriyah Raska P 08/26/2011,8:34 AM

## 2011-08-26 NOTE — Anesthesia Preprocedure Evaluation (Addendum)
Anesthesia Evaluation  Patient identified by MRN, date of birth, ID band Patient awake    Reviewed: Allergy & Precautions, H&P , NPO status , Patient's Chart, lab work & pertinent test results  History of Anesthesia Complications (+) DIFFICULT AIRWAY  Airway Mallampati: III TM Distance: >3 FB   Mouth opening: Limited Mouth Opening  Dental  (+) Teeth Intact and Dental Advisory Given   Pulmonary shortness of breath and with exertion, sleep apnea ,    Pulmonary exam normal + decreased breath sounds      Cardiovascular hypertension, Pt. on home beta blockers + CAD, + Past MI and +CHF + dysrhythmias Atrial Fibrillation Rhythm:Irregular     Neuro/Psych  Headaches,    GI/Hepatic Neg liver ROS,   Endo/Other  negative endocrine ROS  Renal/GU negative Renal ROS     Musculoskeletal negative musculoskeletal ROS (+)   Abdominal Normal abdominal exam  (+) + obese,  Abdomen: soft.    Peds  Hematology negative hematology ROS (+)   Anesthesia Other Findings   Reproductive/Obstetrics                         Anesthesia Physical Anesthesia Plan  ASA: III  Anesthesia Plan: General   Post-op Pain Management:    Induction: Intravenous  Airway Management Planned: Mask  Additional Equipment:   Intra-op Plan:   Post-operative Plan:   Informed Consent: I have reviewed the patients History and Physical, chart, labs and discussed the procedure including the risks, benefits and alternatives for the proposed anesthesia with the patient or authorized representative who has indicated his/her understanding and acceptance.   Dental advisory given  Plan Discussed with: Anesthesiologist, Surgeon and CRNA  Anesthesia Plan Comments:        Anesthesia Quick Evaluation

## 2011-08-26 NOTE — Addendum Note (Signed)
Addendum  created 08/26/11 1109 by Kipp Brood, MD   Modules edited:Anesthesia Attestations, Anesthesia Review and Sign Navigator Section, Charting, Inpatient Notes

## 2011-08-26 NOTE — Progress Notes (Signed)
Pts QTC 2-3 hours post Tikosyn back at 0.522 per 12 lead EKG. Dr Rogelia Boga made aware. No new orders. Will cont to monitor.

## 2011-08-26 NOTE — Anesthesia Postprocedure Evaluation (Signed)
  Anesthesia Post-op Note  Patient: [name]  Procedure(s) Performed: * No procedures listed *  Patient Location: Nursing Unit 3014  Anesthesia Type: MAC  Level of Consciousness: awake, alert  and oriented  Airway and Oxygen Therapy: Patient Spontanous Breathing and Patient connected to nasal cannula oxygen  Post-op Pain: none  Post-op Assessment: Post-op Vital signs reviewed, Patient's Cardiovascular Status Stable, Respiratory Function Stable, Patent Airway and No signs of Nausea or vomiting  Post-op Vital Signs: Reviewed and stable  Complications: No apparent anesthesia complications

## 2011-08-26 NOTE — Anesthesia Procedure Notes (Signed)
Procedure Name: MAC Date/Time: 08/26/2011 9:15 AM Performed by: Margaree Mackintosh Pre-anesthesia Checklist: Patient identified, Emergency Drugs available, Suction available, Patient being monitored and Timeout performed Patient Re-evaluated:Patient Re-evaluated prior to inductionOxygen Delivery Method: Ambu bag Preoxygenation: Pre-oxygenation with 100% oxygen Intubation Type: IV induction Ventilation: Oral airway inserted - appropriate to patient size and Mask ventilation without difficulty Placement Confirmation: breath sounds checked- equal and bilateral Dental Injury: Teeth and Oropharynx as per pre-operative assessment

## 2011-08-27 ENCOUNTER — Other Ambulatory Visit: Payer: Self-pay | Admitting: *Deleted

## 2011-08-27 ENCOUNTER — Encounter (HOSPITAL_COMMUNITY): Payer: Self-pay | Admitting: Physician Assistant

## 2011-08-27 DIAGNOSIS — I4891 Unspecified atrial fibrillation: Secondary | ICD-10-CM | POA: Diagnosis not present

## 2011-08-27 DIAGNOSIS — Z6841 Body Mass Index (BMI) 40.0 and over, adult: Secondary | ICD-10-CM | POA: Diagnosis not present

## 2011-08-27 DIAGNOSIS — I509 Heart failure, unspecified: Secondary | ICD-10-CM | POA: Diagnosis not present

## 2011-08-27 DIAGNOSIS — I5032 Chronic diastolic (congestive) heart failure: Secondary | ICD-10-CM | POA: Diagnosis not present

## 2011-08-27 MED ORDER — FUROSEMIDE 40 MG PO TABS: 20.0000 mg | ORAL_TABLET | Freq: Every day | ORAL | Status: DC

## 2011-08-27 MED ORDER — DOFETILIDE 500 MCG PO CAPS: 500.0000 ug | ORAL_CAPSULE | Freq: Two times a day (BID) | ORAL | Status: DC

## 2011-08-27 MED ORDER — WARFARIN SODIUM 2.5 MG PO TABS
2.5000 mg | ORAL_TABLET | Freq: Once | ORAL | Status: DC
  Filled 2011-08-27: qty 1

## 2011-08-27 NOTE — Progress Notes (Signed)
Pt provided with d/c instructions and education. Pt verbalized understanding. Pt educated on all new meds and how/when to take them. Pt provided with 10day tikosyn per protocol and the prescription for tikosyn. Pt plans to make all f/u as the AVS summarizes. Pt has no questions at this time. IV removed with tip intact. No needs at this time. Ramond Craver, RN

## 2011-08-27 NOTE — Progress Notes (Signed)
UR Completed Trystan Eads Graves-Bigelow, RN,BSN [phone]  

## 2011-08-27 NOTE — Discharge Summary (Signed)
Discharge Summary   Patient ID: [name] MRN: [id number], DOB/AGE: [birth year] 66 y.o. Admit date: 08/23/2011 D/C date:     08/27/2011  Primary Cardiologist: Hochrein  Primary Discharge Diagnoses:  1. Atrial fib s/p Tikosyn load and DCCV this admission - history of multiple cardioversions, previously on lower dose of Tikosyn but ineffective at that time 2. Morbid obesity, BMI 46 3. Possible sleep apnea - will need OP sleep study  Secondary Discharge Diagnoses:  1. Dyslipidemia 2. CAD - 07/2008 NSTEMI ->Cath: LM nl, LAD 70p/90p, 66m, LCX nl, RCA nl, EF 60%.  The LAD was stented with a 2.25x31mm Veri-Flex BMS 3. Chronic diastolic CHF a. 07/2008 Echo: EF 60-65%, Triv AI/MR, Mild TR 4. HTN 5. Osteoarthritis/osteoporosis 6. Headaches 7. H/o positive PPD 8. H/o duodenitis  Hospital Course: [name] is a 66 y/o F with hx of atrial fibrillation, chronic diastolic CHF, HLD, HTN, and CAD who was admitted to [facility] on 08/23/2011 for Tikosyn loading. In the past she had maintained NSR after DCCV (2010) and on Tikosyn therapy ( BID), however, she converted back to a.fib and was taken off Tikosyn. She did not want to pursue another DCCV and opted to be managed with rate control and anticoagulation. Recently she has had progressive dyspnea that was limiting her activities and felt related to her atrial fibrillation. Echo 07/05/11 revealed normal EF and Lexiscan Myoview 07/04/11 did not demonstrate ischemia or infarct. During her office visit with Dr. Antoine Poche on 07/26/11 it was felt she would benefit from readmission for Tikosyn loading in hopes that maintaining NSR would improve her dyspnea. She was brought in 08/23/11 for admission. Lasix and diltiazem were held on admission. Dr. Graciela Husbands notes that previously she was on 250 mcg twice daily; however, her creatinine clearance is north of 80. She was previously initiated at a dose of 500 mcg and had a non-STEMI 2 weeks later associated with  further QT prolongation and her dose was done titrated. Because of that, Dr. Graciela Husbands initiated her at to see how she did. She remained anticoagulated. She underwent DCCV 08/26/11. She converted with third shock to NSR. QTc did not appear to be prolonged. The patient tolerated the procedure well. Today she is maintaining NSR. Dr. Riley Kill has seen and examined her and feels she is stable for discharge. Per pharmacy recommendations, she will remain on home dose of Coumadin with INR check at the end of the week. Per discussion with Dr. Riley Kill, we will resume her home Lasix at 20mg  daily given h/o diastolic CHF. She was not getting this while in the hospital. We will continue potassium at daily (note that K ranged from 3.6-4.3 while in hospital while not on Lasix) and have her follow up closely in the office in the next 2 weeks. She will have labwork next week as well. She was also given a 7 day RX so that [facility] can fill from the pharmacy while her usual pharmacy receives stock.  Discharge Vitals: Blood pressure 122/78, pulse 65, temperature 98.2 F (36.8 C), temperature source Oral, resp. rate 18, height 5\' 5"  (1.651 m), weight 271 lb 9.7 oz (123.2 kg), SpO2 95.00%.  Labs: Lab Results  Component Value Date   WBC 9.4 01/25/2011   HGB 14.4 01/25/2011   HCT 42.4 01/25/2011   MCV 82.9 01/25/2011   PLT 280.0 01/25/2011    Lab 08/26/11 0520  NA 140  K 4.3  CL 100  CO2 28  BUN 21  CREATININE 1.11*  CALCIUM 10.2  PROT --  BILITOT --  ALKPHOS --  ALT --  AST --  GLUCOSE 94    Note: creatinine clearance is 94 ml/min Diagnostic Studies/Procedures   No results found.  Discharge Medications   Medication List  As of 08/27/2011 11:15 AM   STOP taking these medications         diltiazem 180 MG 24 hr capsule         TAKE these medications         dofetilide 500 MCG capsule   Commonly known as: TIKOSYN   Take 1 capsule (500 mcg total) by mouth every 12 (twelve) hours.       furosemide 40 MG tablet   Commonly known as: LASIX   Take 0.5 tablets (20 mg total) by mouth daily.      magnesium oxide 400 MG tablet   Commonly known as: MAG-OX   Take 400 mg by mouth at bedtime.      metoprolol 50 MG tablet   Commonly known as: LOPRESSOR   Take 75 mg by mouth 2 (two) times daily.      potassium chloride SA 20 MEQ tablet   Commonly known as: K-DUR,KLOR-CON   Take 40 mEq by mouth daily.      warfarin 5 MG tablet   Commonly known as: COUMADIN   Take 2.5 mg by mouth every evening.            Disposition   The patient will be discharged in stable condition to home. Discharge Orders    Future Appointments: Provider: Department: Dept Phone: Center:   08/30/2011 1:45 PM [facility] [phone] None   09/04/2011 10:00 AM [facility] [phone] [facility]   09/04/2011 11:00 AM [facility] [phone] [facility]   09/13/2011 11:10 AM Beatrice Lecher, PA [facility] [phone] [facility]     Future Orders Please Complete By Expires   Diet - low sodium heart healthy      Increase activity slowly        Follow-up Information    Follow up with [facility]. (Coumadin Clinic Thursday 08/30/11 at 1:45pm)    Contact information:   [address] [place] [address] [phone]       Follow up with Selena Batten. (For labwork and EKG only (nurse visit) on 09/04/11 at 10am )    Contact information:   [address] [place] [address] [phone]      Follow up with Tereso Newcomer, PA. (You will see Tereso Newcomer PA-C at Dr. Jenene Slicker office on 09/13/11 at 11am)    Contact information:   [address] [place] [address] [phone]       Follow up with Primary Care Doctor. (Please call primary care doctor to discuss setting up an outpatient sleep study to  evaluate for sleep apnea)            Duration of Discharge Encounter: Greater than 30 minutes including physician and PA time.  Signed, Othello Dickenson PA-C 08/27/2011, 11:15 AM

## 2011-08-27 NOTE — Progress Notes (Signed)
Subjective:  Maintaining NSR.  No complaints.  Wants to go home this am.    Objective:  Vital Signs in the last 24 hours: Temp:  [97.3 F (36.3 C)-98.2 F (36.8 C)] 98.2 F (36.8 C) (07/22 0500) Pulse Rate:  [59-100] 60  (07/22 0500) Resp:  [18-20] 18  (07/22 0500) BP: (118-140)/(72-95) 118/72 mmHg (07/22 0500) SpO2:  [95 %-96 %] 95 % (07/22 0500) Weight:  [271 lb 9.7 oz (123.2 kg)] 271 lb 9.7 oz (123.2 kg) (07/22 0500)  Intake/Output from previous day: 07/21 0701 - 07/22 0700 In: 123 [P.O.:120; I.V.:3] Out: -    Physical Exam: General: Well developed, well nourished, in no acute distress. Head:  Normocephalic and atraumatic. Lungs: Clear to auscultation and percussion. Heart: Normal S1 and S2.  No murmur, rubs or gallops.  Pulses: Pulses normal in all 4 extremities. Extremities: No clubbing or cyanosis. No edema. Neurologic: Alert and oriented x 3.    Lab Results: No results found for this basename: WBC:2,HGB:2,PLT:2 in the last 72 hours  Basename 08/26/11 0520 08/25/11 0703  NA 140 137  K 4.3 3.6  CL 100 99  CO2 28 25  GLUCOSE 94 115*  BUN 21 19  CREATININE 1.11* 1.05   No results found for this basename: TROPONINI:2,CK,MB:2 in the last 72 hours Hepatic Function Panel No results found for this basename: PROT,ALBUMIN,AST,ALT,ALKPHOS,BILITOT,BILIDIR,IBILI in the last 72 hours No results found for this basename: CHOL in the last 72 hours No results found for this basename: PROTIME in the last 72 hours  Imaging: No results found.  EKG:  Telem.  QTC measured, and normal.     Assessment/Plan:  Patient Active Hospital Problem List:  FIBRILLATION, ATRIAL (07/10/2006)   Assessment: SP cardioversion   Plan: ECG, discharge on Tikosyn.  Follow up with [facility] Reviewed with patient.        Shawnie Pons, MD, [facility], FSCAI 08/27/2011, 9:06 AM

## 2011-08-27 NOTE — Progress Notes (Signed)
ANTICOAGULATION CONSULT NOTE - Follow Up Consult  Pharmacy Consult for coumadin Indication: atrial fibrillation  Allergies  Allergen Reactions  . Rosuvastatin Other (See Comments)    REACTION: muscle and joint pain    Patient Measurements: Height: 5\' 5"  (165.1 cm) Weight: 271 lb 9.7 oz (123.2 kg) IBW/kg (Calculated) : 57    Vital Signs: Temp: 98.2 F (36.8 C) (07/22 0500) BP: 118/72 mmHg (07/22 0500) Pulse Rate: 60  (07/22 0500)  Labs:  Basename 08/27/11 0630 08/26/11 0520 08/25/11 0703  HGB -- -- --  HCT -- -- --  PLT -- -- --  APTT -- -- --  LABPROT 24.4* 23.2* 23.5*  INR 2.15* 2.02* 2.05*  HEPARINUNFRC -- -- --  CREATININE -- 1.11* 1.05  CKTOTAL -- -- --  CKMB -- -- --  TROPONINI -- -- --    Estimated Creatinine Clearance: 63.9 ml/min (by C-G formula based on Cr of 1.11).  Assessment: Patient is a 66 y.o F on coumadin for s/p DCCV yesterday.  INR remains therapeutic at 2.15. Likely discharge today. No bleeding noted. No CBC available this admission.     Goal of Therapy:  INR 2-3  Plan:  1. Coumadin 2.5mg  PO x 1 tonight (normal home dose) 2. If discharged, would recommend to discharge on her previous home regimen of 2.5mg  PO daily with INR check at the end of the week since INR has been at lower end of goal range.  3. F/u AM INR if still admitted  Conda Wannamaker, Drake Leach 08/27/2011,9:18 AM

## 2011-08-28 ENCOUNTER — Telehealth: Payer: Self-pay | Admitting: Cardiology

## 2011-08-28 NOTE — Telephone Encounter (Signed)
Pt having issues from cardioversion, pt still going in and out of a-fib, BP going up , pt on tikosyn, pls advise

## 2011-08-28 NOTE — Telephone Encounter (Signed)
Spoke to [employer] he advised to continue same medication as prescribed,since B/P is good this morning.Advised to keep appointment with Tereso Newcomer PA 09/13/11.Advised to call back sooner if needed.

## 2011-08-28 NOTE — Telephone Encounter (Signed)
Patient called stated she had a cardioversion last Thursday 08/23/11.States on Sunday while in hospital heart went out of rhythm.Tikosyn was started on Thursday 08/23/11 500 mg twice a day.States she is worried about her elevated B/P.States last night at 11:30 pm 166/137 p 135,at 3:00 am this morning 143/128 p 145.States at present B/P 117/66 p 66.Patient wants to know what to do when B/P goes up.Patient was told Dr.Hochrein not in office today.Will speak to DOD and call her back.

## 2011-08-30 ENCOUNTER — Ambulatory Visit (INDEPENDENT_AMBULATORY_CARE_PROVIDER_SITE_OTHER): Payer: Medicare Other | Admitting: Pharmacist

## 2011-08-30 DIAGNOSIS — Z7901 Long term (current) use of anticoagulants: Secondary | ICD-10-CM | POA: Diagnosis not present

## 2011-08-30 DIAGNOSIS — I4891 Unspecified atrial fibrillation: Secondary | ICD-10-CM | POA: Diagnosis not present

## 2011-08-30 LAB — POCT INR: INR: 3.4

## 2011-09-04 ENCOUNTER — Ambulatory Visit (INDEPENDENT_AMBULATORY_CARE_PROVIDER_SITE_OTHER): Payer: Medicare Other

## 2011-09-04 ENCOUNTER — Other Ambulatory Visit (INDEPENDENT_AMBULATORY_CARE_PROVIDER_SITE_OTHER): Payer: Medicare Other

## 2011-09-04 ENCOUNTER — Other Ambulatory Visit: Payer: Self-pay

## 2011-09-04 VITALS — BP 110/74 | HR 69 | Ht 65.0 in | Wt 266.0 lb

## 2011-09-04 DIAGNOSIS — I4891 Unspecified atrial fibrillation: Secondary | ICD-10-CM

## 2011-09-04 LAB — BASIC METABOLIC PANEL
Calcium: 10.1 mg/dL (ref 8.4–10.5)
GFR: 59.25 mL/min — ABNORMAL LOW (ref >60.00)
Potassium: 4.3 mEq/L (ref 3.5–5.1)
Sodium: 137 mEq/L (ref 135–145)

## 2011-09-04 NOTE — Progress Notes (Signed)
Patient had appointment today for EKG after recent cardioversion.Patient stated her heart has been going in and out of rhythm several times a day.States when it goes out of rhythm will last 5 min to 3 hours and B/P is elevated.Also states when out of rhythm she is nausea,tightness in throat.Spoke to [employer] he advised continue same medication.Keep appointment with PA 09/13/11.EKG today 09/04/11 revealed sinus rhythm at 69 beats/min.

## 2011-09-08 NOTE — Discharge Summary (Signed)
Patient underwent successful cardioversion.  She is ready for discharge.  Has follow up planned with Dr. Antoine Poche.

## 2011-09-13 ENCOUNTER — Ambulatory Visit (INDEPENDENT_AMBULATORY_CARE_PROVIDER_SITE_OTHER): Payer: Medicare Other | Admitting: *Deleted

## 2011-09-13 ENCOUNTER — Encounter: Payer: Self-pay | Admitting: Physician Assistant

## 2011-09-13 ENCOUNTER — Ambulatory Visit (INDEPENDENT_AMBULATORY_CARE_PROVIDER_SITE_OTHER): Payer: Medicare Other | Admitting: Physician Assistant

## 2011-09-13 VITALS — BP 126/74 | HR 61 | Ht 65.0 in | Wt 267.8 lb

## 2011-09-13 DIAGNOSIS — Z7901 Long term (current) use of anticoagulants: Secondary | ICD-10-CM | POA: Diagnosis not present

## 2011-09-13 DIAGNOSIS — I2510 Atherosclerotic heart disease of native coronary artery without angina pectoris: Secondary | ICD-10-CM

## 2011-09-13 DIAGNOSIS — R002 Palpitations: Secondary | ICD-10-CM | POA: Diagnosis not present

## 2011-09-13 DIAGNOSIS — I5032 Chronic diastolic (congestive) heart failure: Secondary | ICD-10-CM

## 2011-09-13 DIAGNOSIS — I4891 Unspecified atrial fibrillation: Secondary | ICD-10-CM

## 2011-09-13 LAB — POCT INR: INR: 2.7

## 2011-09-13 MED ORDER — DOFETILIDE 500 MCG PO CAPS: 500.0000 ug | ORAL_CAPSULE | Freq: Two times a day (BID) | ORAL | Status: DC

## 2011-09-13 NOTE — Patient Instructions (Addendum)
Your physician recommends that you schedule a follow-up appointment in: 4-6 WEEKS WITH DR. HOCHREIN  YOU MAY TAKE AN EXTRA 1/2 TABLET OF THE METOPROLOL IF YOU HAVE PALPITATIONS  NO CHANGES WERE MADE TODAY  RX TIKOSYN WAS SENT IN TODAY [facility]

## 2011-09-13 NOTE — Progress Notes (Signed)
[address] [place], [place]  [address] Phone: [phone] Fax:  [phone]  Date:  09/13/2011   Name:  [name]   DOB:  03/20/1945   MRN:  [id number]  PCP:  Ruthe Mannan, MD  Primary Cardiologist:  Dr. Rollene Rotunda  Primary Electrophysiologist:  Dr. Sherryl Manges    History of Present Illness: [name] is a 66 y.o. female who returns for post hospital follow up.  She has a history of CAD, atrial fibrillation, chronic diastolic CHF, HTN, HL. She was treated with a bare-metal stent to the LAD in 2010 in the setting of NSTEMI.  Myoview 5/13: No ischemia, EF 75%. Echo 5/13: Small LV cavity with obliteration of the apex in systole, inferobasal HK, moderate LVH, EF 55%, mild LAE.   She was recently noted to have symptomatic atrial fibrillation. She had previously been taken off of dofetilide due to recurrent atrial fibrillation. The patient had a history of NSTEMI in the setting of prolonged QT on a higher dose of dofetilide in the past. It was ultimately decided to bring the patient in for dofetilide loading. She was admitted 7/18-7/22. Dr. Graciela Husbands felt that she would be able to take a dose of 500 mcg twice a day. She underwent cardioversion 7/21. Her QTc remained stable.   Today she notes continued episodes of AFib.  These last 5 min up to 3 hrs.  She has throat and chest tightness assoc with dyspnea, nausea and weakness.  They seem to be less frequent.  Otherwise, she denies exertional chest pain or dyspnea, orthopnea, PND.  LE edema is improved.    Wt Readings from Last 3 Encounters:  09/13/11 267 lb 12.8 oz (121.473 kg)  09/04/11 266 lb (120.657 kg)  08/27/11 271 lb 9.7 oz (123.2 kg)     Past Medical History  Diagnosis Date  . Hyperlipidemia   . Hypertension   . Osteoporosis   . Atrial fibrillation     a. s/p multiple cardioversions;  b. previously on Tikosyn lower dose - ineffective, admitted 08/2011 with loading at BID with DCCV at that  time;  c. Rate controlled & on coumadin  . CAD (coronary artery disease) 07/2008    a. 07/2008 NSTEMI ->Cath: LM nl, LAD 70p/90p, 9m, LCX nl, RCA nl, EF 60%.  The LAD was stented with a 2.25x22mm Veri-Flex BMS  . Duodenitis   . Headache   . Chronic diastolic CHF (congestive heart failure)     a. 07/2008 Echo: EF 60-65%, Triv AI/MR, Mild TR  . Morbid obesity   . Difficult intubation   . History of positive PPD   . Arthritis     Current Outpatient Prescriptions  Medication Sig Dispense Refill  . dofetilide (TIKOSYN) 500 MCG capsule Take 500 mcg by mouth every 12 (twelve) hours.      . furosemide (LASIX) 40 MG tablet Take 0.5 tablets (20 mg total) by mouth daily.      . magnesium oxide (MAG-OX) 400 MG tablet Take 400 mg by mouth at bedtime.       . metoprolol (LOPRESSOR) 50 MG tablet Take 75 mg by mouth 2 (two) times daily.      . potassium chloride SA (K-DUR,KLOR-CON) 20 MEQ tablet Take 40 mEq by mouth daily.      [name] warfarin (COUMADIN) 5 MG tablet Take 2.5 mg by mouth every evening.      [name] DISCONTD: dofetilide (TIKOSYN) 500 MCG capsule Take 1 capsule (500 mcg  total) by mouth every 12 (twelve) hours.  60 capsule  2    Allergies: Allergies  Allergen Reactions  . Rosuvastatin Other (See Comments)    REACTION: muscle and joint pain    History  Substance Use Topics  . Smoking status: Never Smoker   . Smokeless tobacco: Never Used  . Alcohol Use: No     ROS:  Please see the history of present illness.    All other systems reviewed and negative.   PHYSICAL EXAM: VS:  BP 126/74  Pulse 61  Ht 5\' 5"  (1.651 m)  Wt 267 lb 12.8 oz (121.473 kg)  BMI 44.56 kg/m2 Well nourished, well developed, in no acute distress HEENT: normal Neck: no JVD at 90 Cardiac:  normal S1, S2; RRR; no murmur Lungs:  clear to auscultation bilaterally, no wheezing, rhonchi or rales Abd: soft, nontender, no hepatomegaly Ext: Trace to 1+ bilateral LE edema Skin: warm and dry Neuro:  CNs 2-12 intact, no  focal abnormalities noted  EKG:  Sinus rhythm, heart rate 60, inferolateral T wave changes, no change from prior tracing, QTc 438 ms     ASSESSMENT AND PLAN:  1. Atrial Fibrillation She is in sinus rhythm today. She is maintained on Coumadin. Her QTC is stable with dofetilide 500 mcg twice a day. Recent potassium and magnesium were acceptable. She continues to have paroxysms of rapid palpitations. They seem to be getting less frequent. I discussed this with Dr. Antoine Poche.  We will continue her current therapy. I have recommended she take an extra metoprolol 25 mg when necessary palpitations. Followup in 4-6 weeks.  2. CAD Recent Myoview low risk. Continue current therapy.  3. Chronic Diastolic CHF Volume stable. Continue current therapy.  4. Hypertension Controlled.  Continue current therapy.   Signed, Tereso Newcomer, PA-C  11:40 AM 09/13/2011

## 2011-10-02 ENCOUNTER — Encounter: Payer: Medicare Other | Admitting: Cardiology

## 2011-10-04 ENCOUNTER — Ambulatory Visit (INDEPENDENT_AMBULATORY_CARE_PROVIDER_SITE_OTHER): Payer: Medicare Other | Admitting: Pharmacist

## 2011-10-04 DIAGNOSIS — I4891 Unspecified atrial fibrillation: Secondary | ICD-10-CM

## 2011-10-04 DIAGNOSIS — Z7901 Long term (current) use of anticoagulants: Secondary | ICD-10-CM | POA: Diagnosis not present

## 2011-10-04 LAB — POCT INR: INR: 3

## 2011-10-18 ENCOUNTER — Ambulatory Visit: Payer: Medicare Other | Admitting: Cardiology

## 2011-10-22 ENCOUNTER — Encounter: Payer: Self-pay | Admitting: Pulmonary Disease

## 2011-10-22 ENCOUNTER — Ambulatory Visit (INDEPENDENT_AMBULATORY_CARE_PROVIDER_SITE_OTHER): Payer: Medicare Other | Admitting: Pulmonary Disease

## 2011-10-22 VITALS — BP 132/72 | HR 76 | Temp 98.6°F | Ht 65.0 in | Wt 274.4 lb

## 2011-10-22 DIAGNOSIS — G4730 Sleep apnea, unspecified: Secondary | ICD-10-CM | POA: Diagnosis not present

## 2011-10-22 DIAGNOSIS — Z23 Encounter for immunization: Secondary | ICD-10-CM

## 2011-10-22 NOTE — Patient Instructions (Addendum)
FLu shot Schedule CPAP titration study We will get you CPAP supplies - call me to confirm that you have cpap machine

## 2011-10-22 NOTE — Progress Notes (Signed)
Subjective:    Patient ID: [name], female    DOB: 29-Dec-1945, 66 y.o.   MRN: [id number]  HPI 66 y.o obese woman referred for evalution of obstructive sleep apnea .  She has a history of CAD, atrial fibrillation, chronic diastolic CHF, HTN, HL. She required  a bare-metal stent to the LAD in 2010 . Echo 5/13: Small LV cavity with obliteration of the apex in systole, inferobasal HK, moderate LVH, EF 55%.  She has symptomatic atrial fibrillation and after trial of dofelitide , has undergone recurrent cardioversion -last  7/21 but reverted to AF. Continued to have spells, takes extra dose of metoprolol, last such 7ds ago. Dr Riley Kill told her to get a PSG.  PSG 10/07 >> AHI 49/h corrected by CPAP 15 cm (wt 200 lbs) She has gained 75 lbs since  ESS 4/24 Bedtime 1030 p, latency 15 mins, she sleeps on an elevated bed with 1 pillow, 2-3 nocturnal awakenings for nocturia, oob at 7 am, feels refreshed, snoring +, denies dryness of mouth, occ headaches + There is no history suggestive of cataplexy, sleep paralysis or parasomnias   Past Medical History  Diagnosis Date  . Hyperlipidemia   . Hypertension   . Osteoporosis   . Atrial fibrillation     a. s/p multiple cardioversions;  b. previously on Tikosyn lower dose - ineffective, admitted 08/2011 with loading at BID with DCCV at that time;  c. Rate controlled & on coumadin  . CAD (coronary artery disease) 07/2008    a. 07/2008 NSTEMI ->Cath: LM nl, LAD 70p/90p, 83m, LCX nl, RCA nl, EF 60%.  The LAD was stented with a 2.25x44mm Veri-Flex BMS  . Duodenitis   . Headache   . Chronic diastolic CHF (congestive heart failure)     a. 07/2008 Echo: EF 60-65%, Triv AI/MR, Mild TR  . Morbid obesity   . Difficult intubation   . History of positive PPD   . Arthritis     Past Surgical History  Procedure Date  . Cholecystectomy   . Abdominal hysterectomy     partial, bleeding  . Knee arthroscopy 2000 & 2001    left and right  .  Esophagogastroduodenoscopy 2003  . Coronary stent placement   . Tubal ligation   . Total knee arthroplasty     bilateral    Allergies  Allergen Reactions  . Rosuvastatin Other (See Comments)    REACTION: muscle and joint pain   History   Social History  . Marital Status: Married    Spouse Name: N/A    Number of Children: 2  . Years of Education: N/A   Occupational History  . retired    Social History Main Topics  . Smoking status: Never Smoker   . Smokeless tobacco: Never Used  . Alcohol Use: No  . Drug Use: No  . Sexually Active: Not Currently    Birth Control/ Protection: Post-menopausal   Other Topics Concern  . Not on file   Social History Narrative  . No narrative on file     Review of Systems  Constitutional: Negative for fever, appetite change and unexpected weight change.  HENT: Negative for ear pain, congestion, sore throat, sneezing and dental problem.   Respiratory: Positive for shortness of breath. Negative for cough.   Cardiovascular: Positive for palpitations and leg swelling. Negative for chest pain.  Gastrointestinal: Negative for abdominal pain.  Musculoskeletal: Negative for joint swelling.  Skin: Negative for rash.  Neurological: Negative for headaches.  Psychiatric/Behavioral: Negative for dysphoric mood. The patient is not nervous/anxious.        Objective:   Physical Exam  Gen. Pleasant, obese, in no distress, normal affect ENT - no lesions, no post nasal drip, class 2-3 airway Neck: No JVD, no thyromegaly, no carotid bruits Lungs: no use of accessory muscles, no dullness to percussion, decreased without rales or rhonchi  Cardiovascular: Rhythm regular, heart sounds  normal, no murmurs or gallops, no peripheral edema Abdomen: soft and non-tender, no hepatosplenomegaly, BS normal. Musculoskeletal: No deformities, no cyanosis or clubbing Neuro:  alert, non focal, no tremors       Assessment & Plan:

## 2011-10-22 NOTE — Assessment & Plan Note (Addendum)
FLu shot Schedule CPAP titration study She could not find her old CPAP machine at home - will start her with new machine @ 15 cm & get donwload We will get you CPAP supplies   Weight loss encouraged, compliance with goal of at least 6 hrs every night is the expectation. Advised against medications with sedative side effects Cautioned against driving when sleepy - understanding that sleepiness will vary on a day to day basis Discussed benefits of CPAP & atrial fibn

## 2011-10-23 ENCOUNTER — Telehealth: Payer: Self-pay | Admitting: Pulmonary Disease

## 2011-10-23 DIAGNOSIS — G4733 Obstructive sleep apnea (adult) (pediatric): Secondary | ICD-10-CM

## 2011-10-23 NOTE — Telephone Encounter (Signed)
Order has been sent to [facility].  

## 2011-10-23 NOTE — Telephone Encounter (Signed)
Returning call.

## 2011-10-23 NOTE — Telephone Encounter (Signed)
LMCBx1. Carron Curie, CMA

## 2011-10-23 NOTE — Telephone Encounter (Signed)
I spoke with pt and she stated she did not find her cpap machine. She looked everywhere for this. Please advise Dr. Vassie Loll thanks

## 2011-10-23 NOTE — Telephone Encounter (Signed)
Send order to DME for CPAP 15 cm, download in4 wks

## 2011-10-25 ENCOUNTER — Encounter: Payer: Self-pay | Admitting: Cardiology

## 2011-10-25 ENCOUNTER — Ambulatory Visit (INDEPENDENT_AMBULATORY_CARE_PROVIDER_SITE_OTHER): Payer: Medicare Other | Admitting: Cardiology

## 2011-10-25 VITALS — BP 155/74 | HR 64 | Ht 65.0 in | Wt 273.8 lb

## 2011-10-25 DIAGNOSIS — I5032 Chronic diastolic (congestive) heart failure: Secondary | ICD-10-CM

## 2011-10-25 DIAGNOSIS — E785 Hyperlipidemia, unspecified: Secondary | ICD-10-CM

## 2011-10-25 DIAGNOSIS — I4891 Unspecified atrial fibrillation: Secondary | ICD-10-CM

## 2011-10-25 DIAGNOSIS — I2510 Atherosclerotic heart disease of native coronary artery without angina pectoris: Secondary | ICD-10-CM | POA: Diagnosis not present

## 2011-10-25 DIAGNOSIS — I509 Heart failure, unspecified: Secondary | ICD-10-CM

## 2011-10-25 DIAGNOSIS — E7800 Pure hypercholesterolemia, unspecified: Secondary | ICD-10-CM

## 2011-10-25 DIAGNOSIS — I10 Essential (primary) hypertension: Secondary | ICD-10-CM

## 2011-10-25 MED ORDER — MAGNESIUM OXIDE 400 MG PO TABS: 400.0000 mg | ORAL_TABLET | Freq: Every day | ORAL | Status: DC

## 2011-10-25 NOTE — Progress Notes (Signed)
HPI The patient presents for followup of her diastolic dysfunction, coronary disease and atrial fibrillation.  Since last being seen she's finally started feeling better. Still she occasionally gets palpitations which are associated with some cramping in her upper chest and neck. These have lasted as long as 4 or 5 hours. However, she's had no significant palpitations in the past week and she says she's not required any extra metoprolol which she has been instructed to take previously. She denies any chest pressure, neck or arm discomfort. She's had no presyncope or syncope. She thinks her breathing is at baseline she's not describing PND or orthopnea. Of note she cannot exercise because of back pain in joint pains. She says she does watch her diet.    Allergies  Allergen Reactions  . Rosuvastatin Other (See Comments)    REACTION: muscle and joint pain    Current Outpatient Prescriptions  Medication Sig Dispense Refill  . dofetilide (TIKOSYN) 500 MCG capsule Take 1 capsule (500 mcg total) by mouth every 12 (twelve) hours.  180 capsule  3  . furosemide (LASIX) 40 MG tablet Take 0.5 tablets (20 mg total) by mouth daily.      . magnesium oxide (MAG-OX) 400 MG tablet Take 400 mg by mouth at bedtime.       . metoprolol (LOPRESSOR) 50 MG tablet Take 75 mg by mouth 2 (two) times daily.      . potassium chloride SA (K-DUR,KLOR-CON) 20 MEQ tablet Take 40 mEq by mouth daily.      [name] warfarin (COUMADIN) 5 MG tablet Take 2.5 mg by mouth every evening.        Past Medical History  Diagnosis Date  . Hyperlipidemia   . Hypertension   . Osteoporosis   . Atrial fibrillation     a. s/p multiple cardioversions;  b. previously on Tikosyn lower dose - ineffective, admitted 08/2011 with loading at BID with DCCV at that time;  c. Rate controlled & on coumadin  . CAD (coronary artery disease) 07/2008    a. 07/2008 NSTEMI ->Cath: LM nl, LAD 70p/90p, 18m, LCX nl, RCA nl, EF 60%.  The LAD was stented with a  2.25x48mm Veri-Flex BMS  . Duodenitis   . Headache   . Chronic diastolic CHF (congestive heart failure)     a. 07/2008 Echo: EF 60-65%, Triv AI/MR, Mild TR  . Morbid obesity   . Difficult intubation   . History of positive PPD   . Arthritis     Past Surgical History  Procedure Date  . Cholecystectomy   . Abdominal hysterectomy     partial, bleeding  . Knee arthroscopy 2000 & 2001    left and right  . Esophagogastroduodenoscopy 2003  . Coronary stent placement   . Tubal ligation   . Total knee arthroplasty     bilateral    Family History  Problem Relation Age of Onset  . Other Mother     GI Bleed  . Pneumonia Father   . Coronary artery disease Brother   . Breast cancer Paternal Grandmother   . Rectal cancer Paternal Grandfather     History   Social History  . Marital Status: Married    Spouse Name: N/A    Number of Children: 2  . Years of Education: N/A   Occupational History  . retired    Social History Main Topics  . Smoking status: Never Smoker   . Smokeless tobacco: Never Used  . Alcohol Use: No  .  Drug Use: No  . Sexually Active: Not Currently    Birth Control/ Protection: Post-menopausal   Other Topics Concern  . Not on file   Social History Narrative  . No narrative on file    ROS:   As stated in the HPI and negative for all other systems.   PHYSICAL EXAM BP 155/74  Pulse 64  Ht 5\' 5"  (1.651 m)  Wt 273 lb 12.8 oz (124.195 kg)  BMI 45.56 kg/m2 GENERAL:  Well appearing HEENT:  Pupils equal round and reactive, fundi not visualized, oral mucosa unremarkable NECK:  No jugular venous distention, waveform within normal limits, carotid upstroke brisk and symmetric, no bruits, no thyromegaly LYMPHATICS:  No cervical, inguinal adenopathy LUNGS:  Clear to auscultation bilaterally BACK:  No CVA tenderness CHEST:  Unremarkable HEART:  PMI not displaced or sustained,S1 and S2 within normal limits, no S3, no clicks, no rubs, no murmurs,  irregular ABD:  Flat, positive bowel sounds normal in frequency in pitch, no bruits, no rebound, no guarding, no midline pulsatile mass, no hepatomegaly, no splenomegaly, obese EXT:  2 plus pulses throughout, no edema, no cyanosis no clubbing  EKG:  Sinus rhythm, rate 60, axis within normal limits, QTC slightly prolonged compared with previous, anterolateral T-wave inversions unchanged from previous.  ASSESSMENT AND PLAN  Atrial Fibrillation  She's having fewer symptoms and tachycardia palpitations. At this point no change in therapy is indicated.  CAD  Myoview in May of this year was low risk. Continue current therapy.   Chronic Diastolic CHF  She seems to be euvolemic.  At this point, no change in therapy is indicated.  We have reviewed salt and fluid restrictions.  No further cardiovascular testing is indicated.  Hypertension  Her blood pressure is elevated today but she says this is unusual and it's typically in the 130s at home. No change in therapy is indicated.  Obesity The patient understands the need to lose weight with diet and exercise. We have discussed specific strategies for this.  Of note she is scheduled for a sleep study as she had some apnea at the time of the last DCCV.

## 2011-10-25 NOTE — Patient Instructions (Addendum)
The current medical regimen is effective;  continue present plan and medications.  Follow up in 6 months with Dr Hochrein.  You will receive a letter in the mail 2 months before you are due.  Please call us when you receive this letter to schedule your follow up appointment.  

## 2011-11-05 ENCOUNTER — Ambulatory Visit (INDEPENDENT_AMBULATORY_CARE_PROVIDER_SITE_OTHER): Payer: Medicare Other | Admitting: *Deleted

## 2011-11-05 DIAGNOSIS — I4891 Unspecified atrial fibrillation: Secondary | ICD-10-CM

## 2011-11-05 DIAGNOSIS — Z7901 Long term (current) use of anticoagulants: Secondary | ICD-10-CM

## 2011-11-05 LAB — POCT INR: INR: 2.8

## 2011-11-06 ENCOUNTER — Other Ambulatory Visit: Payer: Self-pay

## 2011-11-06 MED ORDER — METOPROLOL TARTRATE 50 MG PO TABS: 75.0000 mg | ORAL_TABLET | Freq: Two times a day (BID) | ORAL | Status: DC

## 2011-11-06 NOTE — Telephone Encounter (Signed)
..   Requested Prescriptions   Signed Prescriptions Disp Refills  . metoprolol (LOPRESSOR) 50 MG tablet 30 tablet 6    Sig: Take 1.5 tablets (75 mg total) by mouth 2 (two) times daily.    Authorizing Provider: Rollene Rotunda    Ordering User: Christella Hartigan, Jennea Rager Judie Petit

## 2011-11-13 ENCOUNTER — Ambulatory Visit (HOSPITAL_BASED_OUTPATIENT_CLINIC_OR_DEPARTMENT_OTHER): Payer: Medicare Other | Attending: Pulmonary Disease | Admitting: Radiology

## 2011-11-13 VITALS — Ht 65.0 in | Wt 270.0 lb

## 2011-11-13 DIAGNOSIS — I2510 Atherosclerotic heart disease of native coronary artery without angina pectoris: Secondary | ICD-10-CM | POA: Diagnosis not present

## 2011-11-13 DIAGNOSIS — G4733 Obstructive sleep apnea (adult) (pediatric): Secondary | ICD-10-CM | POA: Diagnosis not present

## 2011-11-13 DIAGNOSIS — Z7901 Long term (current) use of anticoagulants: Secondary | ICD-10-CM | POA: Diagnosis not present

## 2011-11-13 DIAGNOSIS — I509 Heart failure, unspecified: Secondary | ICD-10-CM | POA: Insufficient documentation

## 2011-11-13 DIAGNOSIS — Z9989 Dependence on other enabling machines and devices: Secondary | ICD-10-CM

## 2011-11-13 DIAGNOSIS — I5032 Chronic diastolic (congestive) heart failure: Secondary | ICD-10-CM | POA: Insufficient documentation

## 2011-11-13 DIAGNOSIS — I4891 Unspecified atrial fibrillation: Secondary | ICD-10-CM | POA: Diagnosis not present

## 2011-11-13 DIAGNOSIS — Z79899 Other long term (current) drug therapy: Secondary | ICD-10-CM | POA: Diagnosis not present

## 2011-11-13 DIAGNOSIS — G4730 Sleep apnea, unspecified: Secondary | ICD-10-CM

## 2011-11-19 ENCOUNTER — Encounter: Payer: Self-pay | Admitting: Family Medicine

## 2011-11-19 ENCOUNTER — Ambulatory Visit (INDEPENDENT_AMBULATORY_CARE_PROVIDER_SITE_OTHER): Payer: Medicare Other | Admitting: Family Medicine

## 2011-11-19 VITALS — BP 110/78 | HR 88 | Temp 98.4°F | Wt 268.0 lb

## 2011-11-19 DIAGNOSIS — J069 Acute upper respiratory infection, unspecified: Secondary | ICD-10-CM

## 2011-11-19 MED ORDER — HYDROCOD POLST-CHLORPHEN POLST 10-8 MG/5ML PO LQCR: 5.0000 mL | Freq: Every evening | ORAL | Status: DC | PRN

## 2011-11-19 NOTE — Patient Instructions (Addendum)
I think this is likely viral.   Drink lots of fluids.  Treat sympotmatically with Mucinex, nasal saline irrigation, and Tylenol/Ibuprofen.   Cough suppressant at night. Call if not improving as expected in 5-7 days.

## 2011-11-19 NOTE — Progress Notes (Signed)
SUBJECTIVE:  [name] is a 66 y.o. female who complains of coryza, congestion, itching in eyes and right ear pain for 3 days. She denies a history of anorexia, chest pain, fevers, myalgias, nausea and shortness of breath and denies a history of asthma. Patient denies smoke cigarettes.  Patient Active Problem List  Diagnosis  . VAGINITIS, CANDIDAL  . LACTOSE INTOLERANCE  . HYPERCHOLESTEROLEMIA, PURE  . HYPERLIPIDEMIA  . Obesity, morbid  . STRESS REACTION, ACUTE, WITH EMOTIONAL DISTURBANCE  . TINNITUS  . HYPERTENSION  . CAD  . FIBRILLATION, ATRIAL  . G E R D  . OVERACTIVE BLADDER  . ROSACEA  . OSTEOARTHRITIS  . SLEEP APNEA, SEVERE  . EDEMA  . URINARY INCONTINENCE, MIXED  . SYMPTOM, PAIN, ABDOMINAL, UNSPECIFIED SITE  . POSTSURG PERCUT TRANSLUMINAL COR ANGPLSTY STS  . Vaginitis  . Acute bronchitis  . Encounter for long-term (current) use of anticoagulants  . Chronic diastolic CHF (congestive heart failure)   Past Medical History  Diagnosis Date  . Hyperlipidemia   . Hypertension   . Osteoporosis   . Atrial fibrillation     a. s/p multiple cardioversions;  b. previously on Tikosyn lower dose - ineffective, admitted 08/2011 with loading at BID with DCCV at that time;  c. Rate controlled & on coumadin  . CAD (coronary artery disease) 07/2008    a. 07/2008 NSTEMI ->Cath: LM nl, LAD 70p/90p, 2m, LCX nl, RCA nl, EF 60%.  The LAD was stented with a 2.25x110mm Veri-Flex BMS  . Duodenitis   . Headache   . Chronic diastolic CHF (congestive heart failure)     a. 07/2008 Echo: EF 60-65%, Triv AI/MR, Mild TR  . Morbid obesity   . Difficult intubation   . History of positive PPD   . Arthritis    Past Surgical History  Procedure Date  . Cholecystectomy   . Abdominal hysterectomy     partial, bleeding  . Knee arthroscopy 2000 & 2001    left and right  . Esophagogastroduodenoscopy 2003  . Coronary stent placement   . Tubal ligation   . Total knee arthroplasty    bilateral   History  Substance Use Topics  . Smoking status: Never Smoker   . Smokeless tobacco: Never Used  . Alcohol Use: No   Family History  Problem Relation Age of Onset  . Other Mother     GI Bleed  . Pneumonia Father   . Coronary artery disease Brother   . Breast cancer Paternal Grandmother   . Rectal cancer Paternal Grandfather    Allergies  Allergen Reactions  . Rosuvastatin Other (See Comments)    REACTION: muscle and joint pain   Current Outpatient Prescriptions on File Prior to Visit  Medication Sig Dispense Refill  . dofetilide (TIKOSYN) 500 MCG capsule Take 1 capsule (500 mcg total) by mouth every 12 (twelve) hours.  180 capsule  3  . furosemide (LASIX) 40 MG tablet Take 0.5 tablets (20 mg total) by mouth daily.      . magnesium oxide (MAG-OX) 400 MG tablet Take 1 tablet (400 mg total) by mouth at bedtime.  90 tablet  3  . metoprolol (LOPRESSOR) 50 MG tablet Take 1.5 tablets (75 mg total) by mouth 2 (two) times daily.  30 tablet  6  . potassium chloride SA (K-DUR,KLOR-CON) 20 MEQ tablet Take 40 mEq by mouth daily.      [name] warfarin (COUMADIN) 5 MG tablet Take 2.5 mg by mouth every evening.  The PMH, PSH, Social History, Family History, Medications, and allergies have been reviewed in [facility], and have been updated if relevant.    OBJECTIVE: BP 110/78  Pulse 88  Temp 98.4 F (36.9 C)  Wt 268 lb (121.564 kg)  She appears well, vital signs are as noted. Ears normal.  Throat and pharynx normal.  Neck supple. No adenopathy in the neck. Nose is congested. Sinuses non tender. The chest is clear, without wheezes or rales.  ASSESSMENT:  viral upper respiratory illness  PLAN: Symptomatic therapy suggested: push fluids, rest and return office visit prn if symptoms persist or worsen. Lack of antibiotic effectiveness discussed with her. Call or return to clinic prn if these symptoms worsen or fail to improve as anticipated.

## 2011-11-20 ENCOUNTER — Telehealth: Payer: Self-pay

## 2011-11-20 DIAGNOSIS — G4710 Hypersomnia, unspecified: Secondary | ICD-10-CM

## 2011-11-20 DIAGNOSIS — G4730 Sleep apnea, unspecified: Secondary | ICD-10-CM | POA: Diagnosis not present

## 2011-11-20 MED ORDER — AMOXICILLIN 875 MG PO TABS: 875.0000 mg | ORAL_TABLET | Freq: Two times a day (BID) | ORAL | Status: DC

## 2011-11-20 NOTE — Telephone Encounter (Signed)
Advised patient.  She says she knows she will need protime checked.

## 2011-11-20 NOTE — Telephone Encounter (Signed)
Rx for [facility]oxicillin sent to [facility]. Please make sure she is scheduled to have her INR checked within the next 1-2 weeks as abx can change the thickness of blood.

## 2011-11-20 NOTE — Telephone Encounter (Signed)
Pt seen 11/19/11; this AM right ear hurting worse,sharp pain comes and goes; pain level 5.No fever. [facility] [place].Please advise.

## 2011-11-21 NOTE — Procedures (Signed)
NAMEASHLEE, [name]                 ACCOUNT NO.:  [id number]  MEDICAL RECORD NO.:  [id number]          PATIENT TYPE:  OUT  LOCATION:  SLEEP CENTER                 FACILITY[facility]  [facility]  PHYSICIAN:  Oretha Milch, MD      DATE OF BIRTH:  Feb 01, 1946  DATE OF STUDY:  11/13/2011                           NOCTURNAL POLYSOMNOGRAM  REFERRING PHYSICIAN:  Oretha Milch, MD  INDICATION FOR STUDY:  Ms. Litzinger is a 66 year old woman with coronary artery disease, atrial fibrillation, chronic diastolic heart failure. Polysomnogram in October 2007 showed an AHI of 49 per hour corrected by CPAP of 15 cm.  She weighed 200 pounds and has gained about 70 pounds and at the time of this study, she weighed 270 pounds with a height of 5 feet 5 inches, BMI of 47, neck size of 16 inches.  EPWORTH SLEEPINESS SCORE:  2.  MEDICATIONS:  Bedtime medications included Tikosyn, magnesium, metoprolol, and warfarin.  This CPAP titration study was performed with a sleep technologist in attendance.  EEG, EOG, EMG, EKG, and respiratory parameters were recorded.  Sleep stages, arousals, limb movements, and respiratory data were scored according to criteria laid out by the American Academy of Sleep Medicine.  SLEEP ARCHITECTURE:  Lights out was at 10:48 p.m., lights on was at 5:32 a.m., total sleep time was 367 minutes with a sleep period time of 398 minutes and a sleep efficiency of 91%.  Sleep latency was 6 minutes and latency to REM sleep was 69 minutes with wake after sleep onset being 31 minutes.  Sleep stages of the percentage of sleep time was N1 11%, N2 74%, N3 0.3%, and REM sleep 15% (53 minutes).  Supine sleep accounted for 88 minutes.  Good REM progression was noted with the longest period of REM sleep around 3 a.m.  Arousal Data:  There were total of 90 arousals with an arousal index of 15 events per hour.  Of these, 71 were spontaneous and the rest were associated with respiratory events.  RESPIRATORY DATA:   CPAP was initiated at 5 cm with a medium full-face mask.  This was titrated up to 13 cm to absence of events and further up to 17 cm to absence of snoring.  At the level of 15 cm with 70 minutes of sleep including 32 minutes of REM sleep, 2 central apneas, and 2 hypopneas were noted with the lowest saturation of 90%.  At a final level of 17 cm with 30 minutes of non-REM sleep, no events or snoring was noted.  This appears to be the optimal level used during the study.  OXYGEN DATA:  The desaturation index was 7.5 events per hour.  The lowest desaturation was 83%.  She spent 0.8 minutes with a saturation less than 88%.  CARDIAC DATA:  The low heart rate was 46 beats per minute.  The high heart rate recorded was an artifact.  No arrhythmias were noted.  MOVEMENT-PARASOMNIA:  The limb movement index was 6.5 events per hour. The PLM arousal index was only 0.5 events per hour.  Discussion:  She was desensitized with a medium full-face mask.  C-flex of 3 cm was applied.  Supine sleep was noted during the titration. Titration appears to be optimal.  IMPRESSIONS: 1. Severe obstructive sleep apnea with hypopneas causing sleep     fragmentation and oxygen desaturation. 2. This was corrected by 15 cm to absence of events and 17 cm to     absence of snoring.  CPAP titration was optimal with a medium full-     face mask. 3. No evidence of cardiac arrhythmias, significant limb movements or     behavioral disturbance during sleep.  Recommendation: 1. CPAP can be initiated at 15 cm with a medium full-face mask.  CPAP     compliance should be monitored at this level. 2. The other treatment options include weight loss.  Oral appliance     would be suboptimal for this degree of sleep-disordered breathing. 3. She should be cautioned against driving when sleepy.  She should be     advised against medications, sedative side effects.     Oretha Milch, MD    RVA/MEDQ  D:  11/20/2011 13:11:15   T:  11/21/2011 04:44:25  Job:  [id number]

## 2011-11-26 ENCOUNTER — Encounter: Payer: Self-pay | Admitting: Pulmonary Disease

## 2011-11-26 ENCOUNTER — Ambulatory Visit (INDEPENDENT_AMBULATORY_CARE_PROVIDER_SITE_OTHER): Payer: Medicare Other | Admitting: Pulmonary Disease

## 2011-11-26 VITALS — BP 120/70 | HR 74 | Temp 98.4°F | Ht 65.0 in | Wt 269.8 lb

## 2011-11-26 DIAGNOSIS — G4730 Sleep apnea, unspecified: Secondary | ICD-10-CM | POA: Diagnosis not present

## 2011-11-26 NOTE — Assessment & Plan Note (Signed)
set up with CPAP at 15 cm - we spoke to dme & they will set her up in 1-2 days Download will be checked in 1 month  Weight loss encouraged, compliance with goal of at least 4-6 hrs every night is the expectation. Advised against medications with sedative side effects Cautioned against driving when sleepy - understanding that sleepiness will vary on a day to day basis

## 2011-11-26 NOTE — Patient Instructions (Signed)
YOu will be set up with CPAP at 15 cm Download will be checked in 1 month

## 2011-11-26 NOTE — Progress Notes (Signed)
  Subjective:    Patient ID: [name], female    DOB: 01-Feb-1946, 66 y.o.   MRN: [id number]  HPI  66 y.o obese woman for FU of obstructive sleep apnea .  She has a history of CAD, atrial fibrillation, chronic diastolic CHF, HTN, HL. She required a bare-metal stent to the LAD in 2010 . Echo 5/13: Small LV cavity with obliteration of the apex in systole, inferobasal HK, moderate LVH, EF 55%.  She has symptomatic atrial fibrillation and after trial of dofelitide , has undergone recurrent cardioversion -last 7/21 but reverted to AF.  Continued to have spells, takes extra dose of metoprolol, last such 7ds ago. Dr Riley Kill told her to get a PSG.  PSG 10/07 >> AHI 49/h corrected by CPAP 15 cm (wt 200 lbs) She has gained 75 lbs since  ESS 4/24  Bedtime 1030 p, latency 15 mins, she sleeps on an elevated bed with 1 pillow, 2-3 nocturnal awakenings for nocturia, oob at 7 am, feels refreshed, snoring +, denies dryness of mouth, occ headaches +   11/26/2011 Events were corrected by 15 cm to absence of events and 17 cm to absence of snoring. CPAP titration was optimal with a medium full- face mask. For some reason, she has not been set up yet There is no history suggestive of cataplexy, sleep paralysis or parasomnias    Review of Systems neg for any significant sore throat, dysphagia, itching, sneezing, nasal congestion or excess/ purulent secretions, fever, chills, sweats, unintended wt loss, pleuritic or exertional cp, hempoptysis, orthopnea pnd or change in chronic leg swelling. Also denies presyncope, palpitations, heartburn, abdominal pain, nausea, vomiting, diarrhea or change in bowel or urinary habits, dysuria,hematuria, rash, arthralgias, visual complaints, headache, numbness weakness or ataxia.     Objective:   Physical Exam  Gen. Pleasant, obese, in no distress ENT - no lesions, no post nasal drip Neck: No JVD, no thyromegaly, no carotid bruits Lungs: no use of accessory muscles, no  dullness to percussion, decreased without rales or rhonchi  Cardiovascular: Rhythm regular, heart sounds  normal, no murmurs or gallops, no peripheral edema Musculoskeletal: No deformities, no cyanosis or clubbing , no tremors        Assessment & Plan:

## 2011-11-28 NOTE — Addendum Note (Signed)
Addended by: Marrion Coy L on: 11/28/2011 03:28 PM   Modules accepted: Orders

## 2011-11-29 ENCOUNTER — Telehealth: Payer: Self-pay | Admitting: Pulmonary Disease

## 2011-11-29 DIAGNOSIS — G4730 Sleep apnea, unspecified: Secondary | ICD-10-CM

## 2011-11-30 NOTE — Telephone Encounter (Signed)
She already had a baseline study in the past , that is why CPPA study ws ordered If that is not acceptable, ok to order home study

## 2011-11-30 NOTE — Telephone Encounter (Signed)
i know she did sweetheart but she had a break in her therapy in between that study and the cpap study so medicare has not seen a current npsg which qualifies her for a new cpap

## 2011-11-30 NOTE — Telephone Encounter (Signed)
Ok to order home study 

## 2011-11-30 NOTE — Telephone Encounter (Signed)
i know but it was a cpap titration study not a baseline study and medicare requires this pt said she did not get set up on machine because she would have to pay $90 then and be billed for the rest later she can not afford this i i told her if you would let her do a home study i would give the cpap order to a new dme not [facility]

## 2011-11-30 NOTE — Telephone Encounter (Signed)
She recently had sleep study in lab Ms [name], pl ask [facility] to resolve this issue

## 2011-12-03 NOTE — Telephone Encounter (Signed)
Order has been placed to [facility].  

## 2011-12-03 NOTE — Telephone Encounter (Signed)
Pt will pick up alice 12/04/11 Tobe Sos

## 2011-12-07 ENCOUNTER — Ambulatory Visit (INDEPENDENT_AMBULATORY_CARE_PROVIDER_SITE_OTHER): Payer: Medicare Other | Admitting: Family Medicine

## 2011-12-07 ENCOUNTER — Encounter: Payer: Self-pay | Admitting: Family Medicine

## 2011-12-07 VITALS — BP 126/74 | HR 90 | Temp 98.2°F | Resp 16 | Wt 266.5 lb

## 2011-12-07 DIAGNOSIS — J209 Acute bronchitis, unspecified: Secondary | ICD-10-CM

## 2011-12-07 MED ORDER — HYDROCOD POLST-CHLORPHEN POLST 10-8 MG/5ML PO LQCR: 5.0000 mL | Freq: Every evening | ORAL | Status: DC | PRN

## 2011-12-07 MED ORDER — AMOXICILLIN-POT CLAVULANATE 875-125 MG PO TABS: 1.0000 | ORAL_TABLET | Freq: Two times a day (BID) | ORAL | Status: DC

## 2011-12-07 NOTE — Assessment & Plan Note (Signed)
Given prolonged course of symptoms.. Will broaden antibiotics.. Will use augmentin because it does not interact with tikosyn. Check INR in 4- days given on coumadin.

## 2011-12-07 NOTE — Patient Instructions (Addendum)
Use topical yeast cream for symptoms of itching if they occur. Start augmentin. Have your INR checked next week.  Use mucinex DM for mucous and cough. Can use tussionex at bedtime for cough.Call if not turing the corner in 48-72 hours. Go to ER if Shortness of breath.

## 2011-12-07 NOTE — Progress Notes (Signed)
  Subjective:    Patient ID: [name], female    DOB: Jul 21, 1945, 66 y.o.   MRN: [id number]  Cough This is a new (Initially seen by Dr. Dayton Martes on 10/14 felt it was viral URI, then called with ear apin and treated on amoxicliin x 10 dayts, got a little better but now worse again) problem. The current episode started 1 to 4 weeks ago (3 weeks). The problem has been gradually worsening. The cough is productive of sputum. Associated symptoms include headaches, nasal congestion and rhinorrhea. Pertinent negatives include no chills, ear congestion, ear pain, fever, sore throat, shortness of breath or wheezing. Nothing aggravates the symptoms. Treatments tried: Amoxicillln 10 days, robitussin, coricidin. The treatment provided mild relief. Her past medical history is significant for bronchitis. There is no history of asthma, COPD, emphysema or environmental allergies. CAD, CHF, nonsmoker      Review of Systems  Constitutional: Negative for fever and chills.  HENT: Positive for rhinorrhea. Negative for ear pain and sore throat.   Respiratory: Positive for cough. Negative for shortness of breath and wheezing.   Neurological: Positive for headaches.  Hematological: Negative for environmental allergies.       Objective:   Physical Exam  Constitutional: Vital signs are normal. She appears well-developed and well-nourished. She is cooperative.  Non-toxic appearance. She does not appear ill. No distress.  HENT:  Head: Normocephalic.  Right Ear: Hearing, tympanic membrane, external ear and ear canal normal. Tympanic membrane is not erythematous, not retracted and not bulging.  Left Ear: Hearing, tympanic membrane, external ear and ear canal normal. Tympanic membrane is not erythematous, not retracted and not bulging.  Nose: Mucosal edema and rhinorrhea present. Right sinus exhibits no maxillary sinus tenderness and no frontal sinus tenderness. Left sinus exhibits no maxillary sinus tenderness and no  frontal sinus tenderness.  Mouth/Throat: Uvula is midline, oropharynx is clear and moist and mucous membranes are normal.  Eyes: Conjunctivae normal, EOM and lids are normal. Pupils are equal, round, and reactive to light. No foreign bodies found.  Neck: Trachea normal and normal range of motion. Neck supple. Carotid bruit is not present. No mass and no thyromegaly present.  Cardiovascular: Normal rate, regular rhythm, S1 normal, S2 normal, normal heart sounds, intact distal pulses and normal pulses.  Exam reveals no gallop and no friction rub.   No murmur heard. Pulmonary/Chest: Effort normal and breath sounds normal. Not tachypneic. No respiratory distress. She has no decreased breath sounds. She has no wheezes. She has no rhonchi. She has no rales.  Neurological: She is alert.  Skin: Skin is warm, dry and intact. No rash noted.  Psychiatric: Her speech is normal and behavior is normal. Judgment normal. Her mood appears not anxious. Cognition and memory are normal. She does not exhibit a depressed mood.          Assessment & Plan:

## 2011-12-12 ENCOUNTER — Telehealth: Payer: Self-pay | Admitting: Pulmonary Disease

## 2011-12-12 ENCOUNTER — Ambulatory Visit (INDEPENDENT_AMBULATORY_CARE_PROVIDER_SITE_OTHER): Payer: Medicare Other | Admitting: Pulmonary Disease

## 2011-12-12 DIAGNOSIS — G4730 Sleep apnea, unspecified: Secondary | ICD-10-CM

## 2011-12-12 DIAGNOSIS — G4733 Obstructive sleep apnea (adult) (pediatric): Secondary | ICD-10-CM

## 2011-12-12 NOTE — Telephone Encounter (Signed)
Please send order for CPAP to [facility]. Thanks, Roderic Palau Cobb   Please disregard this. Order has already been placed in Sept. Will send order to [facility] along with a copy of the home study. Rhonda J Cobb

## 2011-12-12 NOTE — Telephone Encounter (Signed)
Home study showed moderate OSA - ms Libby, pl expedite her getting cpap - see prior notes

## 2011-12-12 NOTE — Telephone Encounter (Signed)
Faxed order from 10/22/11 along with home sleep study to [facility], also sent staff message to [place] for [facility] to process order ASAP. Waiting on reply from [place]. Rhonda J Cobb

## 2011-12-13 NOTE — Telephone Encounter (Signed)
Spoke to [employer] she will put a asap on this cpap Tobe Sos

## 2011-12-13 NOTE — Telephone Encounter (Signed)
Called and spoke with patient and she has an appointment on Tues 12/18/11 at 10:00 at [facility] in [place] for CPAP set up. Rhonda J Cobb

## 2011-12-13 NOTE — Telephone Encounter (Signed)
Pt is requesting to speak w/ Almyra Free & asked to be reached at [phone].  Antionette Fairy  e

## 2011-12-17 ENCOUNTER — Ambulatory Visit (INDEPENDENT_AMBULATORY_CARE_PROVIDER_SITE_OTHER): Payer: Medicare Other | Admitting: Pharmacist

## 2011-12-17 DIAGNOSIS — I4891 Unspecified atrial fibrillation: Secondary | ICD-10-CM

## 2011-12-17 DIAGNOSIS — Z7901 Long term (current) use of anticoagulants: Secondary | ICD-10-CM

## 2011-12-17 LAB — POCT INR: INR: 3.5

## 2012-01-15 ENCOUNTER — Ambulatory Visit (INDEPENDENT_AMBULATORY_CARE_PROVIDER_SITE_OTHER): Payer: Medicare Other | Admitting: *Deleted

## 2012-01-15 ENCOUNTER — Other Ambulatory Visit: Payer: Self-pay | Admitting: *Deleted

## 2012-01-15 DIAGNOSIS — I4891 Unspecified atrial fibrillation: Secondary | ICD-10-CM

## 2012-01-15 DIAGNOSIS — Z7901 Long term (current) use of anticoagulants: Secondary | ICD-10-CM

## 2012-01-15 MED ORDER — WARFARIN SODIUM 5 MG PO TABS: ORAL_TABLET | ORAL | Status: DC

## 2012-01-17 ENCOUNTER — Other Ambulatory Visit: Payer: Self-pay

## 2012-01-17 ENCOUNTER — Other Ambulatory Visit: Payer: Self-pay | Admitting: *Deleted

## 2012-01-17 ENCOUNTER — Telehealth: Payer: Self-pay | Admitting: Pulmonary Disease

## 2012-01-17 MED ORDER — METOPROLOL TARTRATE 50 MG PO TABS: 75.0000 mg | ORAL_TABLET | Freq: Two times a day (BID) | ORAL | Status: DC

## 2012-01-17 MED ORDER — WARFARIN SODIUM 5 MG PO TABS: ORAL_TABLET | ORAL | Status: DC

## 2012-01-17 NOTE — Telephone Encounter (Signed)
Download on 15 cm 12/13 >> good control of events - cpap working well, leak ++, needs better seal on mask Pl ensure she has fu appt

## 2012-01-17 NOTE — Telephone Encounter (Signed)
..   Requested Prescriptions   Signed Prescriptions Disp Refills  . metoprolol (LOPRESSOR) 50 MG tablet 180 tablet 10    Sig: Take 1.5 tablets (75 mg total) by mouth 2 (two) times daily.    Authorizing Provider: Rollene Rotunda    Ordering User: Christella Hartigan, Havard Radigan Judie Petit

## 2012-01-21 NOTE — Telephone Encounter (Signed)
Pt returned call. [name]  

## 2012-01-21 NOTE — Telephone Encounter (Signed)
[id number] x1 

## 2012-01-24 NOTE — Telephone Encounter (Signed)
[id number] x1 

## 2012-01-25 NOTE — Telephone Encounter (Signed)
I spoke with patient about results and she verbalized understanding and had no questions 

## 2012-01-27 ENCOUNTER — Telehealth: Payer: Self-pay | Admitting: Pulmonary Disease

## 2012-01-27 NOTE — Telephone Encounter (Signed)
Download  12/13 -CPAP 15  cm very effective Good usage  leak +, needs better seal on mask

## 2012-01-28 NOTE — Telephone Encounter (Signed)
I spoke with patient about results and she verbalized understanding and had no questions 

## 2012-02-12 ENCOUNTER — Ambulatory Visit (INDEPENDENT_AMBULATORY_CARE_PROVIDER_SITE_OTHER): Payer: Medicare Other | Admitting: *Deleted

## 2012-02-12 DIAGNOSIS — I4891 Unspecified atrial fibrillation: Secondary | ICD-10-CM | POA: Diagnosis not present

## 2012-02-12 DIAGNOSIS — Z7901 Long term (current) use of anticoagulants: Secondary | ICD-10-CM | POA: Diagnosis not present

## 2012-03-06 ENCOUNTER — Encounter: Payer: Self-pay | Admitting: Pulmonary Disease

## 2012-03-06 ENCOUNTER — Ambulatory Visit (INDEPENDENT_AMBULATORY_CARE_PROVIDER_SITE_OTHER): Payer: Medicare Other | Admitting: Pulmonary Disease

## 2012-03-06 VITALS — BP 118/72 | HR 66 | Temp 98.0°F | Ht 65.0 in | Wt 274.2 lb

## 2012-03-06 DIAGNOSIS — G4730 Sleep apnea, unspecified: Secondary | ICD-10-CM | POA: Diagnosis not present

## 2012-03-06 NOTE — Progress Notes (Signed)
  Subjective:    Patient ID: [name], female    DOB: 1945/12/14, 67 y.o.   MRN: [id number]  HPI 67 y.o obese woman for FU of obstructive sleep apnea .  She has a history of CAD, atrial fibrillation, chronic diastolic CHF, HTN, HL. She required a bare-metal stent to the LAD in 2010 . Echo 5/13: Small LV cavity with obliteration of the apex in systole, inferobasal HK, moderate LVH, EF 55%.  She has symptomatic atrial fibrillation and after trial of dofelitide , has undergone recurrent cardioversion -last 7/13 but reverted to AF.   PSG 10/07 >> AHI 49/h corrected by CPAP 15 cm (wt 200 lbs) She has gained 75 lbs since   10/13 Events were corrected by 15 cm to absence of events and 17 cm to absence of snoring. CPAP titration was optimal with a medium full- face mask.  Her husband also uses CPAP    03/06/2012 Home study showed moderate OSA  Download on 15 cm 12/13 >> good control of events - cpap working well, leak ++, needs better seal on mask ESS 4/24  Bedtime 1030 p, latency 15 mins, she sleeps on an elevated bed with 1 pillow, 2-3 nocturnal awakenings for nocturia, oob at 7 am, feels refreshed, snoring +, denies dryness of mouth, occ headaches + pt states she wears CPAP everynight x 4-5 hrs a night. denies any problems w/ mask/machine. CPAP seems to be working well for her.  There is no history suggestive of cataplexy, sleep paralysis or parasomnias   Review of Systems neg for any significant sore throat, dysphagia, itching, sneezing, nasal congestion or excess/ purulent secretions, fever, chills, sweats, unintended wt loss, pleuritic or exertional cp, hempoptysis, orthopnea pnd or change in chronic leg swelling. Also denies presyncope, palpitations, heartburn, abdominal pain, nausea, vomiting, diarrhea or change in bowel or urinary habits, dysuria,hematuria, rash, arthralgias, visual complaints, headache, numbness weakness or ataxia.     Objective:   Physical Exam  Gen. Pleasant,  obese, in no distress ENT - no lesions, no post nasal drip Neck: No JVD, no thyromegaly, no carotid bruits Lungs: no use of accessory muscles, no dullness to percussion, decreased without rales or rhonchi  Cardiovascular: Rhythm regular, heart sounds  normal, no murmurs or gallops, no peripheral edema Musculoskeletal: No deformities, no cyanosis or clubbing , no tremors        Assessment & Plan:

## 2012-03-06 NOTE — Assessment & Plan Note (Signed)
You are adjusting well Your CPAP is set at 15 cm  Weight loss encouraged, compliance with goal of at least 4-6 hrs every night is the expectation. Advised against medications with sedative side effects Cautioned against driving when sleepy - understanding that sleepiness will vary on a day to day basis

## 2012-03-06 NOTE — Patient Instructions (Signed)
You are adjusting well Your CPAP is set at 15 cm compliance with goal of at least 4-6 hrs every night is the expectation. Cautioned against driving when sleepy - understanding that sleepiness will vary on a day to day basis

## 2012-03-12 ENCOUNTER — Ambulatory Visit (INDEPENDENT_AMBULATORY_CARE_PROVIDER_SITE_OTHER): Payer: Medicare Other | Admitting: *Deleted

## 2012-03-12 DIAGNOSIS — I4891 Unspecified atrial fibrillation: Secondary | ICD-10-CM | POA: Diagnosis not present

## 2012-03-12 DIAGNOSIS — Z7901 Long term (current) use of anticoagulants: Secondary | ICD-10-CM | POA: Diagnosis not present

## 2012-03-12 LAB — POCT INR: INR: 2.4

## 2012-03-25 ENCOUNTER — Telehealth: Payer: Self-pay | Admitting: Cardiology

## 2012-03-25 MED ORDER — DOFETILIDE 500 MCG PO CAPS: 500.0000 ug | ORAL_CAPSULE | Freq: Two times a day (BID) | ORAL | Status: DC

## 2012-03-25 NOTE — Telephone Encounter (Signed)
Pt changed insurance and no longer uses [facility] - RX sent into [place] as requested

## 2012-03-25 NOTE — Telephone Encounter (Signed)
New Problem    Pt requesting new prescription. Insurance changed. Would like to speak to nurse.  Tikosyn 500 mg

## 2012-03-31 ENCOUNTER — Telehealth: Payer: Self-pay | Admitting: Pulmonary Disease

## 2012-03-31 NOTE — Telephone Encounter (Signed)
Download 2/14 - on 15 cm  No residuals, good compliance CPAP is effective

## 2012-04-01 NOTE — Telephone Encounter (Signed)
I spoke with patient about results and she verbalized understanding and had no questions 

## 2012-04-09 ENCOUNTER — Encounter: Payer: Self-pay | Admitting: Pulmonary Disease

## 2012-04-25 ENCOUNTER — Ambulatory Visit (INDEPENDENT_AMBULATORY_CARE_PROVIDER_SITE_OTHER): Payer: Medicare Other | Admitting: Cardiology

## 2012-04-25 ENCOUNTER — Encounter: Payer: Self-pay | Admitting: Cardiology

## 2012-04-25 ENCOUNTER — Ambulatory Visit (INDEPENDENT_AMBULATORY_CARE_PROVIDER_SITE_OTHER): Payer: Medicare Other

## 2012-04-25 VITALS — BP 130/72 | HR 63 | Ht 65.0 in | Wt 272.0 lb

## 2012-04-25 DIAGNOSIS — I5032 Chronic diastolic (congestive) heart failure: Secondary | ICD-10-CM

## 2012-04-25 DIAGNOSIS — I4891 Unspecified atrial fibrillation: Secondary | ICD-10-CM | POA: Diagnosis not present

## 2012-04-25 DIAGNOSIS — I509 Heart failure, unspecified: Secondary | ICD-10-CM

## 2012-04-25 DIAGNOSIS — Z7901 Long term (current) use of anticoagulants: Secondary | ICD-10-CM

## 2012-04-25 DIAGNOSIS — I10 Essential (primary) hypertension: Secondary | ICD-10-CM | POA: Diagnosis not present

## 2012-04-25 DIAGNOSIS — I2510 Atherosclerotic heart disease of native coronary artery without angina pectoris: Secondary | ICD-10-CM

## 2012-04-25 LAB — BASIC METABOLIC PANEL
Calcium: 9.7 mg/dL (ref 8.4–10.5)
Chloride: 101 mEq/L (ref 96–112)
GFR: 47.36 mL/min — ABNORMAL LOW (ref >60.00)
Glucose, Bld: 99 mg/dL (ref 70–99)
Potassium: 3.8 mEq/L (ref 3.5–5.1)

## 2012-04-25 MED ORDER — FUROSEMIDE 40 MG PO TABS: 20.0000 mg | ORAL_TABLET | Freq: Every day | ORAL | Status: DC

## 2012-04-25 MED ORDER — POTASSIUM CHLORIDE CRYS ER 20 MEQ PO TBCR: 40.0000 meq | EXTENDED_RELEASE_TABLET | Freq: Every day | ORAL | Status: DC

## 2012-04-25 MED ORDER — METOPROLOL TARTRATE 50 MG PO TABS: 75.0000 mg | ORAL_TABLET | Freq: Two times a day (BID) | ORAL | Status: DC

## 2012-04-25 MED ORDER — DOFETILIDE 500 MCG PO CAPS: 500.0000 ug | ORAL_CAPSULE | Freq: Two times a day (BID) | ORAL | Status: DC

## 2012-04-25 NOTE — Patient Instructions (Addendum)
The current medical regimen is effective;  continue present plan and medications.  Please have blood work today (BMP)  Follow up in 1 year with Dr Hochrein.  You will receive a letter in the mail 2 months before you are due.  Please call us when you receive this letter to schedule your follow up appointment.  

## 2012-04-25 NOTE — Progress Notes (Signed)
HPI The patient presents for followup of her diastolic dysfunction, coronary disease and atrial fibrillation.  Since last being seen she's had some palpitations but these have been short lived. She sits down and they go away after a few minutes. They happen with emotional stress. She's had no sustained episodes. She denies any presyncope or syncope. She's not sleeping well. She denies any PND or orthopnea. She's had no new shortness of breath or chest pressure. She said she just doesn't feel good and she talks about anxiety. She seems to have a depressive affect.   Allergies  Allergen Reactions  . Rosuvastatin Other (See Comments)    REACTION: muscle and joint pain    Current Outpatient Prescriptions  Medication Sig Dispense Refill  . dofetilide (TIKOSYN) 500 MCG capsule Take 1 capsule (500 mcg total) by mouth every 12 (twelve) hours.  180 capsule  3  . furosemide (LASIX) 40 MG tablet Take 0.5 tablets (20 mg total) by mouth daily.      . magnesium oxide (MAG-OX) 400 MG tablet Take 1 tablet (400 mg total) by mouth at bedtime.  90 tablet  3  . metoprolol (LOPRESSOR) 50 MG tablet Take 1.5 tablets (75 mg total) by mouth 2 (two) times daily.  180 tablet  10  . potassium chloride SA (K-DUR,KLOR-CON) 20 MEQ tablet Take 40 mEq by mouth daily.      [name] warfarin (COUMADIN) 5 MG tablet Take as directed by coumadin clinic  70 tablet  0   No current facility-administered medications for this visit.    Past Medical History  Diagnosis Date  . Hyperlipidemia   . Hypertension   . Osteoporosis   . Atrial fibrillation     a. s/p multiple cardioversions;  b. previously on Tikosyn lower dose - ineffective, admitted 08/2011 with loading at BID with DCCV at that time;  c. Rate controlled & on coumadin  . CAD (coronary artery disease) 07/2008    a. 07/2008 NSTEMI ->Cath: LM nl, LAD 70p/90p, 71m, LCX nl, RCA nl, EF 60%.  The LAD was stented with a 2.25x68mm Veri-Flex BMS  . Duodenitis   . Headache   .  Chronic diastolic CHF (congestive heart failure)     a. 07/2008 Echo: EF 60-65%, Triv AI/MR, Mild TR  . Morbid obesity   . Difficult intubation   . History of positive PPD   . Arthritis     Past Surgical History  Procedure Laterality Date  . Cholecystectomy    . Abdominal hysterectomy      partial, bleeding  . Knee arthroscopy  2000 & 2001    left and right  . Esophagogastroduodenoscopy  2003  . Coronary stent placement    . Tubal ligation    . Total knee arthroplasty      bilateral   ROS:   As stated in the HPI and negative for all other systems.   PHYSICAL EXAM BP 130/72  Pulse 63  Ht 5\' 5"  (1.651 m)  Wt 272 lb (123.378 kg)  BMI 45.26 kg/m2 GENERAL:  Well appearing HEENT:  Pupils equal round and reactive, fundi not visualized, oral mucosa unremarkable NECK:  No jugular venous distention, waveform within normal limits, carotid upstroke brisk and symmetric, no bruits, no thyromegaly LUNGS:  Clear to auscultation bilaterally CHEST:  Unremarkable HEART:  PMI not displaced or sustained,S1 and S2 within normal limits, no S3, no clicks, no rubs, 2/6 apical and right upper sternal border systolic murmur, no diastolic murmurs. ABD:  Flat,  positive bowel sounds normal in frequency in pitch, no bruits, no rebound, no guarding, no midline pulsatile mass, no hepatomegaly, no splenomegaly, obese EXT:  2 plus pulses throughout, no edema, no cyanosis no clubbing  EKG:  Sinus rhythm, rate 63, axis within normal limits, QTC slightly prolonged compared with previous, anterolateral T-wave inversions unchanged from previous.  04/25/2012  ASSESSMENT AND PLAN  Atrial Fibrillation  She seems to be doing better on Tikosyn.  I will continue this. I will check a basic metabolic profile and magnesium today.  CAD  Myoview in May of last year was low risk. Continue current therapy.   Chronic Diastolic CHF  She seems to be euvolemic.  At this point, no change in therapy is indicated.  We have  reviewed salt and fluid restrictions.  No further cardiovascular testing is indicated.  Hypertension  Her her blood pressure has been better controlled. She will continue the meds as listed..  Obesity The patient understands the need to lose weight with diet and exercise. We have discussed specific strategies for this.  Unfortunately she is unable to exercise because of joint pains.

## 2012-04-28 ENCOUNTER — Telehealth: Payer: Self-pay | Admitting: Cardiology

## 2012-04-28 MED ORDER — MAGNESIUM OXIDE 400 MG PO TABS: 400.0000 mg | ORAL_TABLET | Freq: Every day | ORAL | Status: DC

## 2012-04-28 NOTE — Telephone Encounter (Signed)
Plz return call to call to patient on cell [phone] regarding magnesium oxide RX

## 2012-04-28 NOTE — Telephone Encounter (Signed)
Pt wanted to make sure she is to remain on Mag OX  Refill sent into pharmacy

## 2012-06-13 ENCOUNTER — Ambulatory Visit (INDEPENDENT_AMBULATORY_CARE_PROVIDER_SITE_OTHER): Payer: Medicare Other | Admitting: Pharmacist

## 2012-06-13 DIAGNOSIS — Z7901 Long term (current) use of anticoagulants: Secondary | ICD-10-CM

## 2012-06-13 DIAGNOSIS — I4891 Unspecified atrial fibrillation: Secondary | ICD-10-CM

## 2012-06-17 ENCOUNTER — Ambulatory Visit (INDEPENDENT_AMBULATORY_CARE_PROVIDER_SITE_OTHER): Payer: Medicare Other | Admitting: Family Medicine

## 2012-06-17 ENCOUNTER — Encounter: Payer: Self-pay | Admitting: Family Medicine

## 2012-06-17 VITALS — BP 134/72 | HR 60 | Temp 97.9°F | Wt 273.5 lb

## 2012-06-17 DIAGNOSIS — R21 Rash and other nonspecific skin eruption: Secondary | ICD-10-CM | POA: Insufficient documentation

## 2012-06-17 MED ORDER — TRIAMCINOLONE ACETONIDE 0.1 % EX CREA: TOPICAL_CREAM | Freq: Two times a day (BID) | CUTANEOUS | Status: DC

## 2012-06-17 NOTE — Progress Notes (Signed)
  Subjective:    Patient ID: [name], female    DOB: 1945/03/31, 67 y.o.   MRN: [id number]  HPI CC: forearm rash  For 3 wks has noted forearm rash.  Starting to spread.  Very itchy.  Feels dry to touch.  Started with blisters.  Using benadryl gel which helps with itching.    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  Tends to get bad reactions to poison ivy.  No new lotions/detergents, soaps or shampoos.  No new medicines.  No oral lesions or other rashes anywhere. No fevers/chills, arthralgias.  Past Medical History  Diagnosis Date  . Hyperlipidemia   . Hypertension   . Osteoporosis   . Atrial fibrillation     a. s/p multiple cardioversions;  b. previously on Tikosyn lower dose - ineffective, admitted 08/2011 with loading at BID with DCCV at that time;  c. Rate controlled & on coumadin  . CAD (coronary artery disease) 07/2008    a. 07/2008 NSTEMI ->Cath: LM nl, LAD 70p/90p, 20m, LCX nl, RCA nl, EF 60%.  The LAD was stented with a 2.25x5mm Veri-Flex BMS  . Duodenitis   . Headache   . Chronic diastolic CHF (congestive heart failure)     a. 07/2008 Echo: EF 60-65%, Triv AI/MR, Mild TR  . Morbid obesity   . Difficult intubation   . History of positive PPD   . Arthritis      Review of Systems Per HPI    Objective:   Physical Exam  Nursing note and vitals reviewed. Constitutional: She appears well-developed and well-nourished. No distress.  HENT:  Mouth/Throat: Oropharynx is clear and moist. No oropharyngeal exudate.  Skin: Rash noted.  Bilateral dorsal forearms with rough skin, pruritic papules and some early lichenification, excoriations present       Assessment & Plan:

## 2012-06-17 NOTE — Patient Instructions (Signed)
This does look like contact dermatitis or poison ivy. May use benadryl by mouth at night for itching (may make you sleepy). Look into oatmeal bath.  Poison [employer] ivy is a inflammation of the skin (contact dermatitis) caused by touching the allergens on the leaves of the ivy plant following previous exposure to the plant. The rash usually appears 48 hours after exposure. The rash is usually bumps (papules) or blisters (vesicles) in a linear pattern. Depending on your own sensitivity, the rash may simply cause redness and itching, or it may also progress to blisters which may break open. These must be well cared for to prevent secondary bacterial (germ) infection, followed by scarring. Keep any open areas dry, clean, dressed, and covered with an antibacterial ointment if needed. The eyes may also get puffy. The puffiness is worst in the morning and gets better as the day progresses. This dermatitis usually heals without scarring, within 2 to 3 weeks without treatment. HOME CARE INSTRUCTIONS  Thoroughly wash with soap and water as soon as you have been exposed to poison ivy. You have about one half hour to remove the plant resin before it will cause the rash. This washing will destroy the oil or antigen on the skin that is causing, or will cause, the rash. Be sure to wash under your fingernails as any plant resin there will continue to spread the rash. Do not rub skin vigorously when washing affected area. Poison ivy cannot spread if no oil from the plant remains on your body. A rash that has progressed to weeping sores will not spread the rash unless you have not washed thoroughly. It is also important to wash any clothes you have been wearing as these may carry active allergens. The rash will return if you wear the unwashed clothing, even several days later. Avoidance of the plant in the future is the best measure. Poison ivy plant can be recognized by the number of leaves. Generally, poison ivy has  three leaves with flowering branches on a single stem. Diphenhydramine may be purchased over the counter and used as needed for itching. Do not drive with this medication if it makes you drowsy.Ask your caregiver about medication for children. SEEK MEDICAL CARE IF:  Open sores develop.  Redness spreads beyond area of rash.  You notice purulent (pus-like) discharge.  You have increased pain.  Other signs of infection develop (such as fever). Document Released: 01/20/2000 Document Revised: 04/16/2011 Document Reviewed: 12/08/2008 [facility] Patient Information 2013 [place], [place].

## 2012-06-17 NOTE — Assessment & Plan Note (Signed)
Contact dermatitis, possibly posion ivy. Treat with medium potency steroid cream - sent in TCI cream for pt to use. May also use OTC benadryl or zyrtec for itch. See pt instructions for further info. To update [place] if sxs worsen or rash spreads despite treatment.

## 2012-07-11 ENCOUNTER — Other Ambulatory Visit: Payer: Self-pay | Admitting: *Deleted

## 2012-07-11 ENCOUNTER — Other Ambulatory Visit: Payer: Self-pay | Admitting: Cardiology

## 2012-07-15 ENCOUNTER — Telehealth: Payer: Self-pay | Admitting: Cardiology

## 2012-07-15 NOTE — Telephone Encounter (Signed)
New problem  Pt states that her scripts that were sent to [facility] on [address] in [place] has missed placed her prescriptions for wafarin 5 mg nad furosemide 40 mg.

## 2012-07-15 NOTE — Telephone Encounter (Signed)
Pt aware [name] has rx for both refills needed  They will be ready for pick up today

## 2012-07-28 ENCOUNTER — Ambulatory Visit (INDEPENDENT_AMBULATORY_CARE_PROVIDER_SITE_OTHER): Payer: Medicare Other | Admitting: *Deleted

## 2012-07-28 DIAGNOSIS — Z7901 Long term (current) use of anticoagulants: Secondary | ICD-10-CM | POA: Diagnosis not present

## 2012-07-28 DIAGNOSIS — I4891 Unspecified atrial fibrillation: Secondary | ICD-10-CM | POA: Diagnosis not present

## 2012-07-29 ENCOUNTER — Ambulatory Visit (INDEPENDENT_AMBULATORY_CARE_PROVIDER_SITE_OTHER): Payer: Medicare Other | Admitting: Family Medicine

## 2012-07-29 ENCOUNTER — Encounter: Payer: Self-pay | Admitting: Family Medicine

## 2012-07-29 VITALS — BP 132/68 | HR 92 | Temp 98.1°F | Wt 271.0 lb

## 2012-07-29 DIAGNOSIS — J209 Acute bronchitis, unspecified: Secondary | ICD-10-CM | POA: Diagnosis not present

## 2012-07-29 MED ORDER — AMOXICILLIN 875 MG PO TABS: 875.0000 mg | ORAL_TABLET | Freq: Two times a day (BID) | ORAL | Status: DC

## 2012-07-29 MED ORDER — HYDROCOD POLST-CHLORPHEN POLST 10-8 MG/5ML PO LQCR: 5.0000 mL | Freq: Every evening | ORAL | Status: DC | PRN

## 2012-07-29 NOTE — Progress Notes (Signed)
Subjective:    Patient ID: [name], female    DOB: 01/16/1946, 67 y.o.   MRN: [id number]  HPI 1 week of URI symptoms.  Started ~1 week ago and progressively worsening Sinus pressure, bad cough, esp at night, but also in day Some sinus drainage Cough is dry, feels "tight like bronchitis"  Afebrile.   No chills or sweats at night No changes in SOB.   Current Outpatient Prescriptions on File Prior to Visit  Medication Sig Dispense Refill  . dofetilide (TIKOSYN) 500 MCG capsule Take 1 capsule (500 mcg total) by mouth every 12 (twelve) hours.  180 capsule  3  . furosemide (LASIX) 40 MG tablet Take 0.5 tablets (20 mg total) by mouth daily.  45 tablet  3  . magnesium oxide (MAG-OX) 400 MG tablet Take 1 tablet (400 mg total) by mouth at bedtime.  90 tablet  3  . metoprolol (LOPRESSOR) 50 MG tablet Take 1.5 tablets (75 mg total) by mouth 2 (two) times daily.  270 tablet  3  . potassium chloride SA (K-DUR,KLOR-CON) 20 MEQ tablet Take 2 tablets (40 mEq total) by mouth daily.  180 tablet  3  . warfarin (COUMADIN) 5 MG tablet Take as directed by coumadin clinic  30 tablet  3   No current facility-administered medications on file prior to visit.    Allergies  Allergen Reactions  . Rosuvastatin Other (See Comments)    REACTION: muscle and joint pain    Past Medical History  Diagnosis Date  . Hyperlipidemia   . Hypertension   . Osteoporosis   . Atrial fibrillation     a. s/p multiple cardioversions;  b. previously on Tikosyn lower dose - ineffective, admitted 08/2011 with loading at BID with DCCV at that time;  c. Rate controlled & on coumadin  . CAD (coronary artery disease) 07/2008    a. 07/2008 NSTEMI ->Cath: LM nl, LAD 70p/90p, 55m, LCX nl, RCA nl, EF 60%.  The LAD was stented with a 2.25x32mm Veri-Flex BMS  . Duodenitis   . Headache(784.0)   . Chronic diastolic CHF (congestive heart failure)     a. 07/2008 Echo: EF 60-65%, Triv AI/MR, Mild TR  . Morbid obesity   .  Difficult intubation   . History of positive PPD   . Arthritis     Past Surgical History  Procedure Laterality Date  . Cholecystectomy    . Abdominal hysterectomy      partial, bleeding  . Knee arthroscopy  2000 & 2001    left and right  . Esophagogastroduodenoscopy  2003  . Coronary stent placement    . Tubal ligation    . Total knee arthroplasty      bilateral    Family History  Problem Relation Age of Onset  . Other Mother     GI Bleed  . Pneumonia Father   . Coronary artery disease Brother   . Breast cancer Paternal Grandmother   . Rectal cancer Paternal Grandfather     History   Social History  . Marital Status: Married    Spouse Name: N/A    Number of Children: 2  . Years of Education: N/A   Occupational History  . retired    Social History Main Topics  . Smoking status: Never Smoker   . Smokeless tobacco: Never Used  . Alcohol Use: No  . Drug Use: No  . Sexually Active: Not Currently    Birth Control/ Protection: Post-menopausal   Other Topics  Concern  . Not on file   Social History Narrative  . No narrative on file   Review of Systems No vomiting. See HPI     Objective:   Physical Exam  BP 132/68  Pulse 92  Temp(Src) 98.1 F (36.7 C)  Wt 271 lb (122.925 kg)  BMI 45.1 kg/m2  Constitutional: She appears well-developed and well-nourished. No distress.       Coarse cough, no wheezes  HENT:    +ethmoid tenderness Moderate nasal congestion Slight pharyngeal injection without exudates  Neck: Normal range of motion. Neck supple.  Pulmonary/Chest: Breath sounds normal. No respiratory distress. She has no wheezes. She has no rales.  Lymphadenopathy:    She has no cervical adenopathy.       Assessment & Plan:  1. Acute bronchitis Given duration and progression of symptoms, will treat for bacterial process. Amoxicillin 875 mg twice daily x 10 days. Tussionex as needed for cough. Lab Results  Component Value Date   INR 2.1 07/28/2012    INR 3.0 06/13/2012   INR 3.0 04/25/2012   PROTIME 19.2 07/20/2008   PROTIME 20.4 07/08/2008   On coumadin- therapeutic.  Will have her come in after she finishes amoxicillin to recheck PT/INR.

## 2012-07-29 NOTE — Patient Instructions (Addendum)
Good to see you. Please take Amoxicillin as directed- 1 tablet twice daily x 10 days. Tussionex as needed for cough. Please come back for your coumadin check (INR) after you finish your antibiotics.

## 2012-09-08 ENCOUNTER — Ambulatory Visit (INDEPENDENT_AMBULATORY_CARE_PROVIDER_SITE_OTHER): Payer: Medicare Other | Admitting: *Deleted

## 2012-09-08 DIAGNOSIS — Z7901 Long term (current) use of anticoagulants: Secondary | ICD-10-CM

## 2012-09-08 DIAGNOSIS — I4891 Unspecified atrial fibrillation: Secondary | ICD-10-CM | POA: Diagnosis not present

## 2012-09-08 LAB — POCT INR: INR: 2.9

## 2012-09-15 ENCOUNTER — Encounter: Payer: Self-pay | Admitting: Pulmonary Disease

## 2012-09-15 ENCOUNTER — Ambulatory Visit (INDEPENDENT_AMBULATORY_CARE_PROVIDER_SITE_OTHER): Payer: Medicare Other | Admitting: Pulmonary Disease

## 2012-09-15 VITALS — BP 124/76 | HR 60 | Temp 98.2°F | Ht 65.0 in | Wt 275.4 lb

## 2012-09-15 DIAGNOSIS — G4730 Sleep apnea, unspecified: Secondary | ICD-10-CM | POA: Diagnosis not present

## 2012-09-15 NOTE — Assessment & Plan Note (Signed)
We discussed benefits of CPAP on atrial fibrillation Call for [place] to check download if palpitations worse or if pressure needs to be changed You are set at 15 cm  Weight loss encouraged, compliance with goal of at least 4-6 hrs every night is the expectation. Advised against medications with sedative side effects Cautioned against driving when sleepy - understanding that sleepiness will vary on a day to day basis

## 2012-09-15 NOTE — Progress Notes (Signed)
  Subjective:    Patient ID: [name], female    DOB: [birth year], 67 y.o.   MRN: [id number]  HPI   67 y.o obese woman for FU of obstructive sleep apnea .  She has a history of CAD, atrial fibrillation, chronic diastolic CHF, HTN, HL. She required a bare-metal stent to the LAD in 2010 . Echo 5/13: Small LV cavity with obliteration of the apex in systole, inferobasal HK, moderate LVH, EF 55%.  She has symptomatic atrial fibrillation and after trial of dofelitide , underwent recurrent cardioversion -last 7/13 but reverted to AF.  PSG 10/07 >> AHI 49/h corrected by CPAP 15 cm (wt 200 lbs) She has gained 75 lbs since  10/13 Events were corrected by 15 cm to absence of events and 17 cm to absence of snoring. CPAP titration was optimal with a medium full- face mask.  Her husband also uses CPAP   Home study showed moderate OSA  Download on 15 cm 12/13 >> good control of events - cpap working well, leak ++, needs better seal on mask   09/15/2012  Download 2/14 - on 15 cm  No residuals, good compliance  CPAP is effective ESS 4/24  Bedtime 1030 p, latency 15 mins, she sleeps on an elevated bed with 1 pillow, 2-3 nocturnal awakenings for nocturia, oob at 7 am, feels refreshed, snoring +, denies dryness of mouth,headaches  Pt reports she wears her CPAP everynight x 4-8 hrs a night. Denies any problems w/ mask/machine. She just got a new mask. She is feeling rested during the day.   Review of Systems neg for any significant sore throat, dysphagia, itching, sneezing, nasal congestion or excess/ purulent secretions, fever, chills, sweats, unintended wt loss, pleuritic or exertional cp, hempoptysis, orthopnea pnd or change in chronic leg swelling. Also denies presyncope, palpitations, heartburn, abdominal pain, nausea, vomiting, diarrhea or change in bowel or urinary habits, dysuria,hematuria, rash, arthralgias, visual complaints, headache, numbness weakness or ataxia.     Objective:   Physical  Exam  Gen. Pleasant, obese, in no distress ENT - no lesions, no post nasal drip Neck: No JVD, no thyromegaly, no carotid bruits Lungs: no use of accessory muscles, no dullness to percussion, decreased without rales or rhonchi  Cardiovascular: Rhythm regular, heart sounds  normal, no murmurs or gallops, no peripheral edema Musculoskeletal: No deformities, no cyanosis or clubbing , no tremors       Assessment & Plan:

## 2012-09-15 NOTE — Patient Instructions (Addendum)
We discussed benefits of CPAP on atrial fibrillation Call for [place] to check download if palpitations worse or if pressure needs to be changed You are set at 15 cm

## 2012-10-21 ENCOUNTER — Ambulatory Visit (INDEPENDENT_AMBULATORY_CARE_PROVIDER_SITE_OTHER): Payer: Medicare Other

## 2012-10-21 DIAGNOSIS — Z7901 Long term (current) use of anticoagulants: Secondary | ICD-10-CM | POA: Diagnosis not present

## 2012-10-21 DIAGNOSIS — I4891 Unspecified atrial fibrillation: Secondary | ICD-10-CM

## 2012-11-21 DIAGNOSIS — Z23 Encounter for immunization: Secondary | ICD-10-CM | POA: Diagnosis not present

## 2012-12-02 ENCOUNTER — Ambulatory Visit (INDEPENDENT_AMBULATORY_CARE_PROVIDER_SITE_OTHER): Payer: Medicare Other | Admitting: *Deleted

## 2012-12-02 DIAGNOSIS — Z7901 Long term (current) use of anticoagulants: Secondary | ICD-10-CM

## 2012-12-02 DIAGNOSIS — I4891 Unspecified atrial fibrillation: Secondary | ICD-10-CM

## 2013-01-14 ENCOUNTER — Ambulatory Visit (INDEPENDENT_AMBULATORY_CARE_PROVIDER_SITE_OTHER): Payer: Medicare Other | Admitting: *Deleted

## 2013-01-14 DIAGNOSIS — I4891 Unspecified atrial fibrillation: Secondary | ICD-10-CM | POA: Diagnosis not present

## 2013-01-14 DIAGNOSIS — Z7901 Long term (current) use of anticoagulants: Secondary | ICD-10-CM

## 2013-02-07 ENCOUNTER — Emergency Department (HOSPITAL_COMMUNITY): Payer: Medicare Other

## 2013-02-07 ENCOUNTER — Emergency Department (HOSPITAL_COMMUNITY)
Admission: EM | Admit: 2013-02-07 | Discharge: 2013-02-08 | Disposition: A | Discharge status: Home / Self Care | Payer: Medicare Other | Attending: Emergency Medicine | Admitting: Emergency Medicine

## 2013-02-07 ENCOUNTER — Encounter (HOSPITAL_COMMUNITY): Payer: Self-pay | Admitting: Emergency Medicine

## 2013-02-07 DIAGNOSIS — Z8719 Personal history of other diseases of the digestive system: Secondary | ICD-10-CM | POA: Diagnosis not present

## 2013-02-07 DIAGNOSIS — I4891 Unspecified atrial fibrillation: Secondary | ICD-10-CM

## 2013-02-07 DIAGNOSIS — R69 Illness, unspecified: Secondary | ICD-10-CM

## 2013-02-07 DIAGNOSIS — Z8639 Personal history of other endocrine, nutritional and metabolic disease: Secondary | ICD-10-CM | POA: Insufficient documentation

## 2013-02-07 DIAGNOSIS — R079 Chest pain, unspecified: Secondary | ICD-10-CM | POA: Diagnosis not present

## 2013-02-07 DIAGNOSIS — J111 Influenza due to unidentified influenza virus with other respiratory manifestations: Secondary | ICD-10-CM | POA: Diagnosis not present

## 2013-02-07 DIAGNOSIS — M129 Arthropathy, unspecified: Secondary | ICD-10-CM | POA: Insufficient documentation

## 2013-02-07 DIAGNOSIS — Z862 Personal history of diseases of the blood and blood-forming organs and certain disorders involving the immune mechanism: Secondary | ICD-10-CM | POA: Insufficient documentation

## 2013-02-07 DIAGNOSIS — I5032 Chronic diastolic (congestive) heart failure: Secondary | ICD-10-CM | POA: Diagnosis not present

## 2013-02-07 DIAGNOSIS — Z7901 Long term (current) use of anticoagulants: Secondary | ICD-10-CM | POA: Insufficient documentation

## 2013-02-07 DIAGNOSIS — I2510 Atherosclerotic heart disease of native coronary artery without angina pectoris: Secondary | ICD-10-CM | POA: Diagnosis not present

## 2013-02-07 DIAGNOSIS — I10 Essential (primary) hypertension: Secondary | ICD-10-CM | POA: Insufficient documentation

## 2013-02-07 DIAGNOSIS — Z79899 Other long term (current) drug therapy: Secondary | ICD-10-CM | POA: Diagnosis not present

## 2013-02-07 DIAGNOSIS — R0602 Shortness of breath: Secondary | ICD-10-CM | POA: Diagnosis not present

## 2013-02-07 LAB — BASIC METABOLIC PANEL
BUN: 16 mg/dL (ref 6–23)
CALCIUM: 9.1 mg/dL (ref 8.4–10.5)
CO2: 25 mEq/L (ref 19–32)
Chloride: 98 mEq/L (ref 96–112)
Creatinine, Ser: 1.01 mg/dL (ref 0.50–1.10)
GFR calc Af Amer: 64 mL/min — ABNORMAL LOW (ref >90)
GFR, EST NON AFRICAN AMERICAN: 55 mL/min — AB (ref >90)
GLUCOSE: 129 mg/dL — AB (ref 70–99)
Potassium: 4 mEq/L (ref 3.7–5.3)
Sodium: 136 mEq/L — ABNORMAL LOW (ref 137–147)

## 2013-02-07 LAB — CBC
HCT: 42.6 % (ref 36.0–46.0)
Hemoglobin: 14.3 g/dL (ref 12.0–15.0)
MCH: 27.8 pg (ref 26.0–34.0)
MCHC: 33.6 g/dL (ref 30.0–36.0)
MCV: 82.9 fL (ref 78.0–100.0)
PLATELETS: 197 10*3/uL (ref 150–400)
RBC: 5.14 MIL/uL — ABNORMAL HIGH (ref 3.87–5.11)
RDW: 13.6 % (ref 11.5–15.5)
WBC: 9.1 10*3/uL (ref 4.0–10.5)

## 2013-02-07 LAB — POCT I-STAT TROPONIN I: Troponin i, poc: 0.07 ng/mL (ref 0.00–0.08)

## 2013-02-07 LAB — PROTIME-INR
INR: 1.7 — ABNORMAL HIGH (ref 0.00–1.49)
Prothrombin Time: 19.5 seconds — ABNORMAL HIGH (ref 11.6–15.2)

## 2013-02-07 LAB — CG4 I-STAT (LACTIC ACID): Lactic Acid, Venous: 0.87 mmol/L (ref 0.5–2.2)

## 2013-02-07 LAB — MAGNESIUM: Magnesium: 2 mg/dL (ref 1.5–2.5)

## 2013-02-07 LAB — PRO B NATRIURETIC PEPTIDE: Pro B Natriuretic peptide (BNP): 2359 pg/mL — ABNORMAL HIGH (ref 0–125)

## 2013-02-07 MED ORDER — ACETAMINOPHEN 325 MG PO TABS
650.0000 mg | ORAL_TABLET | Freq: Four times a day (QID) | ORAL | Status: DC | PRN
  Administered 2013-02-07: 650 mg via ORAL
  Filled 2013-02-07: qty 2

## 2013-02-07 NOTE — ED Provider Notes (Signed)
CSN: [id number]     Arrival date & time 02/07/13  2210 History   First MD Initiated Contact with Patient 02/07/13 2238     CC: Cough, fever, and congestion  HPI Pt started with cough and congestion on Thursday.  Yesterday her symptoms got worse and she started developing fevers.  Today she was feeling worse and fatigued.  She also has had some nasal congestion and body aches.  She has history of  A fib and has felt like she was going in an out of it today.  She called EMS today because of the worsening symptoms.  EMS gave her cardizem 5 mg.  Past Medical History  Diagnosis Date  . Hyperlipidemia   . Hypertension   . Osteoporosis   . Atrial fibrillation     a. s/p multiple cardioversions;  b. previously on Tikosyn lower dose - ineffective, admitted 08/2011 with loading at BID with DCCV at that time;  c. Rate controlled & on coumadin  . CAD (coronary artery disease) 07/2008    a. 07/2008 NSTEMI ->Cath: LM nl, LAD 70p/90p, 3m, LCX nl, RCA nl, EF 60%.  The LAD was stented with a 2.25x1mm Veri-Flex BMS  . Duodenitis   . Headache(784.0)   . Chronic diastolic CHF (congestive heart failure)     a. 07/2008 Echo: EF 60-65%, Triv AI/MR, Mild TR  . Morbid obesity   . Difficult intubation   . History of positive PPD   . Arthritis    Past Surgical History  Procedure Laterality Date  . Cholecystectomy    . Abdominal hysterectomy      partial, bleeding  . Knee arthroscopy  2000 & 2001    left and right  . Esophagogastroduodenoscopy  2003  . Coronary stent placement    . Tubal ligation    . Total knee arthroplasty      bilateral   Family History  Problem Relation Age of Onset  . Other Mother     GI Bleed  . Pneumonia Father   . Coronary artery disease Brother   . Breast cancer Paternal Grandmother   . Rectal cancer Paternal Grandfather    History  Substance Use Topics  . Smoking status: Never Smoker   . Smokeless tobacco: Never Used  . Alcohol Use: No   OB History   Grav  Para Term Preterm Abortions TAB SAB Ect Mult Living                 Review of Systems  Constitutional: Positive for fever.  Gastrointestinal: Positive for vomiting.  Genitourinary: Negative for dysuria.  Skin: Negative for rash.  Neurological: Positive for headaches.  Psychiatric/Behavioral: Negative for confusion.  All other systems reviewed and are negative.    Allergies  Rosuvastatin  Home Medications   Current Outpatient Rx  Name  Route  Sig  Dispense  Refill  . dofetilide (TIKOSYN) 500 MCG capsule   Oral   Take 1 capsule (500 mcg total) by mouth every 12 (twelve) hours.   180 capsule   3     Hold Rx  Pt will call when refill needed   . furosemide (LASIX) 40 MG tablet   Oral   Take 0.5 tablets (20 mg total) by mouth daily.   45 tablet   3     Hold  Pt will call when refill needed   . magnesium oxide (MAG-OX) 400 MG tablet   Oral   Take 1 tablet (400 mg total) by mouth at  bedtime.   90 tablet   3   . metoprolol (LOPRESSOR) 50 MG tablet   Oral   Take 1.5 tablets (75 mg total) by mouth 2 (two) times daily.   270 tablet   3   . potassium chloride SA (K-DUR,KLOR-CON) 20 MEQ tablet   Oral   Take 2 tablets (40 mEq total) by mouth daily.   180 tablet   3     Hold - pt will call when refill needed   . warfarin (COUMADIN) 5 MG tablet   Oral   Take 2.5 mg by mouth at bedtime.         [name] HYDROcodone-acetaminophen (HYCET) 7.5-325 mg/15 ml solution   Oral   Take 15 mLs by mouth 4 (four) times daily as needed for moderate pain (and cough).   120 mL   0   . oseltamivir (TAMIFLU) 75 MG capsule   Oral   Take 1 capsule (75 mg total) by mouth 2 (two) times daily.   9 capsule   0    BP 150/57  Pulse 73  Temp(Src) 101.7 F (38.7 C) (Oral)  Resp 18  SpO2 96% Physical Exam  Nursing note and vitals reviewed. Constitutional: She appears well-developed and well-nourished. No distress.  HENT:  Head: Normocephalic and atraumatic.  Right Ear: External ear  normal.  Left Ear: External ear normal.  Eyes: Conjunctivae are normal. Right eye exhibits no discharge. Left eye exhibits no discharge. No scleral icterus.  Neck: Neck supple. No tracheal deviation present.  Cardiovascular: Normal rate and intact distal pulses.  An irregularly irregular rhythm present.  Pulmonary/Chest: Effort normal and breath sounds normal. No stridor. No respiratory distress. She has no wheezes. She has no rales.  Abdominal: Soft. Bowel sounds are normal. She exhibits no distension. There is no tenderness. There is no rebound and no guarding.  Musculoskeletal: She exhibits no edema and no tenderness.  Neurological: She is alert. She has normal strength. No sensory deficit. Cranial nerve deficit:  no gross defecits noted. She exhibits normal muscle tone. She displays no seizure activity. Coordination normal.  Skin: Skin is warm and dry. No rash noted.  Psychiatric: She has a normal mood and affect.    ED Course  Procedures (including critical care time) Labs Review Labs Reviewed  BASIC METABOLIC PANEL - Abnormal; Notable for the following:    Sodium 136 (*)    Glucose, Bld 129 (*)    GFR calc non Af Amer 55 (*)    GFR calc Af Amer 64 (*)    All other components within normal limits  CBC - Abnormal; Notable for the following:    RBC 5.14 (*)    All other components within normal limits  PROTIME-INR - Abnormal; Notable for the following:    Prothrombin Time 19.5 (*)    INR 1.70 (*)    All other components within normal limits  PRO B NATRIURETIC PEPTIDE - Abnormal; Notable for the following:    Pro B Natriuretic peptide (BNP) 2359.0 (*)    All other components within normal limits  MAGNESIUM  POCT I-STAT TROPONIN I  CG4 I-STAT (LACTIC ACID)   Imaging Review Dg Chest Port 1 View  02/07/2013   CLINICAL DATA:  Chest pain. Shortness of breath. Atrial fibrillation.  EXAM: PORTABLE CHEST - 1 VIEW  COMPARISON:  06/27/2006  FINDINGS: Mild cardiomegaly remains stable.  Both lungs are clear. No evidence of pleural effusion. No mass or lymphadenopathy identified.  IMPRESSION: Stable cardiomegaly.  No  active lung disease.   Electronically Signed   By: Myles RosenthalJohn  Stahl M.D.   On: 02/07/2013 23:15    EKG Interpretation    Date/Time:  Saturday February 07 2013 22:16:40 EST Ventricular Rate:  81 PR Interval:  111 QRS Duration: 88 QT Interval:  375 QTC Calculation: 435 R Axis:   48 Text Interpretation:  Sinus rhythm Atrial premature complex Borderline short PR interval Probable LVH with secondary repol abnrm No significant change since last tracing Confirmed by Khayman Kirsch  MD-J, Sabrea Sankey ([id number]) on 02/07/2013 11:09:21 PM           Medications  acetaminophen (TYLENOL) tablet 650 mg (650 mg Oral Given 02/07/13 2239)  oseltamivir (TAMIFLU) capsule 75 mg (not administered)    MDM   1. Influenza-like illness   2. Atrial fibrillation    No PNA on cxr.  Elevated BNP but no pulmonary edema on CXR.  Doubt chf exacerbation.  Elevated BNP may have been related to her a fib with rvr earlier.  Suspect influenza with her fever and cough.  Pt would like to go home.  Will dc home on tamiflu and hydrocodone for her cough.    Celene KrasJon R Derwin Reddy, MD 02/08/13 [phone]

## 2013-02-07 NOTE — ED Notes (Signed)
To room via EMS.  Onset today intermittant chest pressure, BP elevated.  Pt noted to be in Afib, HR 156.  EMS gave Cardiazem 5mg , NSR, then back to Afib, EMS gave another 5mg .  Zofran 4mg .  Pts HR 80, Afib.  Initial BP 160/90, last BP 123/79.  Pt has had coughing, runny nose, headache, chills x 2 days.

## 2013-02-08 MED ORDER — HYDROCODONE-ACETAMINOPHEN 7.5-325 MG/15ML PO SOLN: 15.0000 mL | Freq: Four times a day (QID) | ORAL | Status: DC | PRN

## 2013-02-08 MED ORDER — OSELTAMIVIR PHOSPHATE 75 MG PO CAPS: 75.0000 mg | ORAL_CAPSULE | Freq: Two times a day (BID) | ORAL | Status: DC

## 2013-02-08 MED ORDER — OSELTAMIVIR PHOSPHATE 75 MG PO CAPS
75.0000 mg | ORAL_CAPSULE | Freq: Two times a day (BID) | ORAL | Status: DC
  Administered 2013-02-08: 75 mg via ORAL
  Filled 2013-02-08: qty 1

## 2013-02-08 NOTE — Discharge Instructions (Signed)

## 2013-02-25 ENCOUNTER — Ambulatory Visit (INDEPENDENT_AMBULATORY_CARE_PROVIDER_SITE_OTHER): Payer: Medicare Other | Admitting: *Deleted

## 2013-02-25 DIAGNOSIS — I4891 Unspecified atrial fibrillation: Secondary | ICD-10-CM | POA: Diagnosis not present

## 2013-02-25 DIAGNOSIS — Z7901 Long term (current) use of anticoagulants: Secondary | ICD-10-CM

## 2013-02-25 DIAGNOSIS — Z5181 Encounter for therapeutic drug level monitoring: Secondary | ICD-10-CM

## 2013-02-25 LAB — POCT INR: INR: 3.9

## 2013-02-25 MED ORDER — WARFARIN SODIUM 5 MG PO TABS: ORAL_TABLET | ORAL | Status: DC

## 2013-03-03 ENCOUNTER — Encounter: Payer: Self-pay | Admitting: Family Medicine

## 2013-03-03 ENCOUNTER — Telehealth: Payer: Self-pay | Admitting: Cardiology

## 2013-03-03 ENCOUNTER — Ambulatory Visit (INDEPENDENT_AMBULATORY_CARE_PROVIDER_SITE_OTHER): Payer: Medicare Other | Admitting: Family Medicine

## 2013-03-03 VITALS — BP 136/86 | HR 77 | Temp 98.0°F | Wt 276.5 lb

## 2013-03-03 DIAGNOSIS — J019 Acute sinusitis, unspecified: Secondary | ICD-10-CM | POA: Diagnosis not present

## 2013-03-03 MED ORDER — AMOXICILLIN 875 MG PO TABS: 875.0000 mg | ORAL_TABLET | Freq: Two times a day (BID) | ORAL | Status: DC

## 2013-03-03 MED ORDER — FLUTICASONE PROPIONATE 50 MCG/ACT NA SUSP: 2.0000 | Freq: Every day | NASAL | Status: DC

## 2013-03-03 NOTE — Patient Instructions (Addendum)
Good to see you. Please take Amoxicillin as directed- 1 tablet twice daily x 10 days. Flonase as directed.   Please call your cardiologist to let them know you are on an antibiotic.

## 2013-03-03 NOTE — Progress Notes (Signed)
Subjective:    Patient ID: [name], female    DOB: 06-17-1945, 68 y.o.   MRN: [id number]  HPI  68 yo female with h/o afib on coumadin here for congestion, sinus pressure and fatigue for weeks.  Was seen in ER on 02/07/13 for rapid heart rate and fever. Diagnosed with ILI and afib with RVR- given Tamiflu and cardizem.  Fever has resolved but congestion and sinus pressure have worsened.  Heart rate has felt ok-  Has not had sensation that her heart rate has been rapid.  NO CP or SOB.  INR checked this week at cardiologist Patient Active Problem List   Diagnosis Date Noted  . Acute sinus infection 03/03/2013  . Skin rash 06/17/2012  . Chronic diastolic CHF (congestive heart failure) 06/26/2011  . Encounter for long-term (current) use of anticoagulants 02/07/2011  . Acute bronchitis 02/05/2011  . Vaginitis 10/23/2010  . CAD 12/01/2008  . POSTSURG PERCUT TRANSLUMINAL COR ANGPLSTY STS 08/24/2008  . STRESS REACTION, ACUTE, WITH EMOTIONAL DISTURBANCE 07/01/2008  . VAGINITIS, CANDIDAL 06/23/2008  . G E R D 08/30/2006  . SYMPTOM, PAIN, ABDOMINAL, UNSPECIFIED SITE 08/26/2006  . LACTOSE INTOLERANCE 08/06/2006  . HYPERLIPIDEMIA 08/06/2006  . Obesity, morbid 08/06/2006  . TINNITUS 08/06/2006  . HYPERTENSION 08/06/2006  . OVERACTIVE BLADDER 08/06/2006  . ROSACEA 08/06/2006  . OSTEOARTHRITIS 08/06/2006  . SLEEP APNEA, SEVERE 08/06/2006  . EDEMA 08/06/2006  . URINARY INCONTINENCE, MIXED 08/06/2006  . HYPERCHOLESTEROLEMIA, PURE 08/01/2006  . FIBRILLATION, ATRIAL 07/10/2006   Past Medical History  Diagnosis Date  . Hyperlipidemia   . Hypertension   . Osteoporosis   . Atrial fibrillation     a. s/p multiple cardioversions;  b. previously on Tikosyn lower dose - ineffective, admitted 08/2011 with loading at 500mcg BID with DCCV at that time;  c. Rate controlled & on coumadin  . CAD (coronary artery disease) 07/2008    a. 07/2008 NSTEMI ->Cath: LM nl, LAD 70p/90p, 7840m, LCX nl, RCA  nl, EF 60%.  The LAD was stented with a 2.25x7020mm Veri-Flex BMS  . Duodenitis   . Headache(784.0)   . Chronic diastolic CHF (congestive heart failure)     a. 07/2008 Echo: EF 60-65%, Triv AI/MR, Mild TR  . Morbid obesity   . Difficult intubation   . History of positive PPD   . Arthritis    Past Surgical History  Procedure Laterality Date  . Cholecystectomy    . Abdominal hysterectomy      partial, bleeding  . Knee arthroscopy  2000 & 2001    left and right  . Esophagogastroduodenoscopy  2003  . Coronary stent placement    . Tubal ligation    . Total knee arthroplasty      bilateral   History  Substance Use Topics  . Smoking status: Never Smoker   . Smokeless tobacco: Never Used  . Alcohol Use: No   Family History  Problem Relation Age of Onset  . Other Mother     GI Bleed  . Pneumonia Father   . Coronary artery disease Brother   . Breast cancer Paternal Grandmother   . Rectal cancer Paternal Grandfather    Allergies  Allergen Reactions  . Rosuvastatin Other (See Comments)    REACTION: muscle and joint pain   Current Outpatient Prescriptions on File Prior to Visit  Medication Sig Dispense Refill  . dofetilide (TIKOSYN) 500 MCG capsule Take 1 capsule (500 mcg total) by mouth every 12 (twelve) hours.  180 capsule  3  . furosemide (LASIX) 40 MG tablet Take 0.5 tablets (20 mg total) by mouth daily.  45 tablet  3  . magnesium oxide (MAG-OX) 400 MG tablet Take 1 tablet (400 mg total) by mouth at bedtime.  90 tablet  3  . metoprolol (LOPRESSOR) 50 MG tablet Take 1.5 tablets (75 mg total) by mouth 2 (two) times daily.  270 tablet  3  . potassium chloride SA (K-DUR,KLOR-CON) 20 MEQ tablet Take 2 tablets (40 mEq total) by mouth daily.  180 tablet  3  . warfarin (COUMADIN) 5 MG tablet Take as directed by coumadin clinic  20 tablet  3   No current facility-administered medications on file prior to visit.   The PMH, PSH, Social History, Family History, Medications, and  allergies have been reviewed in [facility], and have been updated if relevant.   Review of Systems    See HPI No n/v/d Objective:   Physical Exam  Constitutional: She appears well-developed and well-nourished. No distress.  HENT:  Right Ear: Tympanic membrane is retracted.  Left Ear: Tympanic membrane is retracted.  Nose: Rhinorrhea present. Right sinus exhibits frontal sinus tenderness. Left sinus exhibits frontal sinus tenderness.  Cardiovascular: Normal rate and regular rhythm.   Pulmonary/Chest: Effort normal and breath sounds normal.  Skin: Skin is warm, dry and intact.   BP 136/86  Pulse 77  Temp(Src) 98 F (36.7 C) (Oral)  Wt 276 lb 8 oz (125.42 kg)  SpO2 97%        Assessment & Plan:

## 2013-03-03 NOTE — Assessment & Plan Note (Signed)
Given duration and progression of symptoms, will treat for bacterial sinusitis with Amoxicillin. Advised contacting cardiology to let them know that she needs her INR rechecked sooner.

## 2013-03-03 NOTE — Telephone Encounter (Signed)
New Message  Pt states she recently seen primary care who put her on amoxicillin. Pt is requesting a call back to determine if a sooner appt is needed for coumadin

## 2013-03-03 NOTE — Progress Notes (Signed)
Pre-visit discussion using our clinic review tool. No additional management support is needed unless otherwise documented below in the visit note.  

## 2013-03-03 NOTE — Telephone Encounter (Signed)
Pt called that she is on Amoxicillin and instructed that there is no interaction between Amoxicillin and Coumadin and she states understanding.

## 2013-03-11 ENCOUNTER — Ambulatory Visit (INDEPENDENT_AMBULATORY_CARE_PROVIDER_SITE_OTHER): Payer: Medicare Other | Admitting: *Deleted

## 2013-03-11 DIAGNOSIS — Z5181 Encounter for therapeutic drug level monitoring: Secondary | ICD-10-CM

## 2013-03-11 DIAGNOSIS — I4891 Unspecified atrial fibrillation: Secondary | ICD-10-CM

## 2013-03-11 DIAGNOSIS — Z7901 Long term (current) use of anticoagulants: Secondary | ICD-10-CM | POA: Diagnosis not present

## 2013-03-11 LAB — POCT INR: INR: 3.1

## 2013-03-26 ENCOUNTER — Encounter: Payer: Self-pay | Admitting: Internal Medicine

## 2013-03-26 ENCOUNTER — Ambulatory Visit (INDEPENDENT_AMBULATORY_CARE_PROVIDER_SITE_OTHER): Payer: Medicare Other | Admitting: Internal Medicine

## 2013-03-26 VITALS — BP 122/68 | HR 60 | Temp 98.1°F | Wt 275.0 lb

## 2013-03-26 DIAGNOSIS — B379 Candidiasis, unspecified: Secondary | ICD-10-CM | POA: Diagnosis not present

## 2013-03-26 DIAGNOSIS — J309 Allergic rhinitis, unspecified: Secondary | ICD-10-CM | POA: Diagnosis not present

## 2013-03-26 DIAGNOSIS — T3695XA Adverse effect of unspecified systemic antibiotic, initial encounter: Secondary | ICD-10-CM

## 2013-03-26 MED ORDER — FLUCONAZOLE 150 MG PO TABS: 150.0000 mg | ORAL_TABLET | Freq: Once | ORAL | Status: DC

## 2013-03-26 MED ORDER — METHYLPREDNISOLONE ACETATE 80 MG/ML IJ SUSP
80.0000 mg | Freq: Once | INTRAMUSCULAR | Status: AC
  Administered 2013-03-26: 80 mg via INTRAMUSCULAR

## 2013-03-26 NOTE — Progress Notes (Signed)
HPI: Pt presents to the office today with concerns regarding sinus issues. Pt states she has been having sinus congestion/pressure/runny nose for about one month. She was recently treated for acute sinusitis one month ago with Amoxicillin. She states this help for a few days, but symptoms returned pretty quickly. She endorses nasal discharge, worse in the am, clear drainage, sinus pressure, tenderness, and headache. She did start wearing a nasal CPAP machine recently, but does not attribute this to her symptoms. She has not tried anything OTC.   Pt also endorses vaginal itching with some discharge. Pt stated this started a few days after finishing her antibiotic. She does admit to getting yeast infections after antibiotic therapy. She denies odor, pain, or irritation. She has tried OTC Monistat, with minimal relief.    Review of Systems    Past Medical History  Diagnosis Date  . Hyperlipidemia   . Hypertension   . Osteoporosis   . Atrial fibrillation     a. s/p multiple cardioversions;  b. previously on Tikosyn lower dose - ineffective, admitted 08/2011 with loading at BID with DCCV at that time;  c. Rate controlled & on coumadin  . CAD (coronary artery disease) 07/2008    a. 07/2008 NSTEMI ->Cath: LM nl, LAD 70p/90p, 23m, LCX nl, RCA nl, EF 60%.  The LAD was stented with a 2.25x16mm Veri-Flex BMS  . Duodenitis   . Headache(784.0)   . Chronic diastolic CHF (congestive heart failure)     a. 07/2008 Echo: EF 60-65%, Triv AI/MR, Mild TR  . Morbid obesity   . Difficult intubation   . History of positive PPD   . Arthritis     Family History  Problem Relation Age of Onset  . Other Mother     GI Bleed  . Pneumonia Father   . Coronary artery disease Brother   . Breast cancer Paternal Grandmother   . Rectal cancer Paternal Grandfather     History   Social History  . Marital Status: Married    Spouse Name: N/A    Number of Children: 2  . Years of Education: N/A   Occupational  History  . retired    Social History Main Topics  . Smoking status: Never Smoker   . Smokeless tobacco: Never Used  . Alcohol Use: No  . Drug Use: No  . Sexual Activity: Not Currently    Birth Control/ Protection: Post-menopausal   Other Topics Concern  . Not on file   Social History Narrative  . No narrative on file    Allergies  Allergen Reactions  . Rosuvastatin Other (See Comments)    REACTION: muscle and joint pain     Constitutional: Positive headache. Denies fever, fatigue, or abrupt weight changes.  HEENT:  Positive eye pain, pressure behind the eyes, facial pain, nasal congestion/discharge. Denies sore throat eye redness, ear pain, ringing in the ears, wax buildup, runny nose or bloody nose. Respiratory:Denies cough, difficulty breathing or shortness of breath.  Cardiovascular: Denies chest pain, chest tightness, palpitations or swelling in the hands or feet.  GYN: Endorses vaginal itching with some discharge. Denies odor or irritation.   No other specific complaints in a complete review of systems (except as listed in HPI above).  Objective:    General: Appears his stated age, well developed, well nourished in NAD. HEENT: Head: normal shape and size, sinus tenderness noted; Eyes: sclera white, no icterus, conjunctiva pink, PERRLA and EOMs intact; Ears: Tm's gray and intact, normal light reflex;  Nose: mucosa pink and moist, septum midline, minimal frontal sinus tenderness; Throat/Mouth: + PND. Teeth present, mucosa pink and moist, no exudate noted, no lesions or ulcerations noted.  Neck: Neck supple, trachea midline. No massses, lumps or thyromegaly present.  Cardiovascular: Normal rate and rhythm. S1,S2 noted.  No murmur, rubs or gallops noted. No JVD or BLE edema. No carotid bruits noted. Pulmonary/Chest: Normal effort and positive vesicular breath sounds. No respiratory distress. No wheezes, rales or ronchi noted.      Assessment & Plan:   Allergic  Rhinitis Depo 80mg  IM injection given today Can use a Neti Pot which can be purchased from your local drug store. Flonase 2 sprays each nostril for 3 days and then as needed. Recommended OTC Zyrtec/Claritin daily Follow up in one week if no improvement  Vaginal yeast infection: Rx Diflucan 150mg  PO times one dose  Shaquita Fort S, Student-NP

## 2013-03-26 NOTE — Patient Instructions (Addendum)
Allergic Rhinitis Allergic rhinitis is when the mucous membranes in the nose respond to allergens. Allergens are particles in the air that cause your body to have an allergic reaction. This causes you to release allergic antibodies. Through a chain of events, these eventually cause you to release histamine into the blood stream. Although meant to protect the body, it is this release of histamine that causes your discomfort, such as frequent sneezing, congestion, and an itchy, runny nose.  CAUSES  Seasonal allergic rhinitis (hay fever) is caused by pollen allergens that may come from grasses, trees, and weeds. Year-round allergic rhinitis (perennial allergic rhinitis) is caused by allergens such as house dust mites, pet dander, and mold spores.  SYMPTOMS   Nasal stuffiness (congestion).  Itchy, runny nose with sneezing and tearing of the eyes. DIAGNOSIS  Your health care provider can help you determine the allergen or allergens that trigger your symptoms. If you and your health care provider are unable to determine the allergen, skin or blood testing may be used. TREATMENT  Allergic Rhinitis does not have a cure, but it can be controlled by:  Medicines and allergy shots (immunotherapy).  Avoiding the allergen. Hay fever may often be treated with antihistamines in pill or nasal spray forms. Antihistamines block the effects of histamine. There are over-the-counter medicines that may help with nasal congestion and swelling around the eyes. Check with your health care provider before taking or giving this medicine.  If avoiding the allergen or the medicine prescribed do not work, there are many new medicines your health care provider can prescribe. Stronger medicine may be used if initial measures are ineffective. Desensitizing injections can be used if medicine and avoidance does not work. Desensitization is when a patient is given ongoing shots until the body becomes less sensitive to the allergen.  Make sure you follow up with your health care provider if problems continue. HOME CARE INSTRUCTIONS It is not possible to completely avoid allergens, but you can reduce your symptoms by taking steps to limit your exposure to them. It helps to know exactly what you are allergic to so that you can avoid your specific triggers. SEEK MEDICAL CARE IF:   You have a fever.  You develop a cough that does not stop easily (persistent).  You have shortness of breath.  You start wheezing.  Symptoms interfere with normal daily activities. Document Released: 10/17/2000 Document Revised: 11/12/2012 Document Reviewed: 09/29/2012 [employer] Patient Information 2014 [employer].  

## 2013-03-26 NOTE — Progress Notes (Signed)
Pre visit review using our clinic review tool, if applicable. No additional management support is needed unless otherwise documented below in the visit note. 

## 2013-03-26 NOTE — Progress Notes (Signed)
HPI  Pt presents to the clinic today with c/o facial pain and pressure, runny nose. This started over a month ago. She is blowing clear mucous out of her nose. She was just treated for a sinus infection 03/03/13 with Amoxicillin. She has only felt some improvement since that time. She also reports that her symptoms seem to be worse in the am. She is sneezing multiple times per day. She has taken tylenol OTC. Additionally, she c/o vaginal itching after taking the antibiotic. She has not noticed any discharge from the vagina. She does report that she is prone to vaginal yeast infections.  Review of Systems    Past Medical History  Diagnosis Date  . Hyperlipidemia   . Hypertension   . Osteoporosis   . Atrial fibrillation     a. s/p multiple cardioversions;  b. previously on Tikosyn lower dose - ineffective, admitted 08/2011 with loading at 500mcg BID with DCCV at that time;  c. Rate controlled & on coumadin  . CAD (coronary artery disease) 07/2008    a. 07/2008 NSTEMI ->Cath: LM nl, LAD 70p/90p, 4450m, LCX nl, RCA nl, EF 60%.  The LAD was stented with a 2.25x3820mm Veri-Flex BMS  . Duodenitis   . Headache(784.0)   . Chronic diastolic CHF (congestive heart failure)     a. 07/2008 Echo: EF 60-65%, Triv AI/MR, Mild TR  . Morbid obesity   . Difficult intubation   . History of positive PPD   . Arthritis     Family History  Problem Relation Age of Onset  . Other Mother     GI Bleed  . Pneumonia Father   . Coronary artery disease Brother   . Breast cancer Paternal Grandmother   . Rectal cancer Paternal Grandfather     History   Social History  . Marital Status: Married    Spouse Name: N/A    Number of Children: 2  . Years of Education: N/A   Occupational History  . retired    Social History Main Topics  . Smoking status: Never Smoker   . Smokeless tobacco: Never Used  . Alcohol Use: No  . Drug Use: No  . Sexual Activity: Not Currently    Birth Control/ Protection: Post-menopausal    Other Topics Concern  . Not on file   Social History Narrative  . No narrative on file    Allergies  Allergen Reactions  . Rosuvastatin Other (See Comments)    REACTION: muscle and joint pain     Constitutional: Positive headache, fatigue. Denies fever or abrupt weight changes.  HEENT:  Positive runny nose. Denies eye redness, ear pain, ringing in the ears, wax buildup, or bloody nose. Respiratory:  Denies difficulty breathing or shortness of breath.  Cardiovascular: Denies chest pain, chest tightness, palpitations or swelling in the hands or feet.   No other specific complaints in a complete review of systems (except as listed in HPI above).  Objective:    General: Appears her stated age, well developed, well nourished in NAD. HEENT: Head: normal shape and size, sinus tenderness noted; Eyes: sclera white, no icterus, conjunctiva pink, PERRLA and EOMs intact; Ears: Tm's gray and intact, normal light reflex; Nose: mucosa pink and moist, septum midline; Throat/Mouth: + PND. Teeth present, mucosa pink and moist, no exudate noted, no lesions or ulcerations noted.  Neck: Neck supple, trachea midline. No massses, lumps or thyromegaly present.  Cardiovascular: Normal rate and rhythm. S1,S2 noted.  No murmur, rubs or gallops noted. No JVD  or BLE edema. No carotid bruits noted. Pulmonary/Chest: Normal effort and positive vesicular breath sounds. No respiratory distress. No wheezes, rales or ronchi noted.      Assessment & Plan:   Allergic Rhinitis  No indication for repeat abx at this time. Advised her to take an antihistamine OTC (allegra, zyrtec) 80 mg Depo IM today Ok to continue tylenol for pain, discomfort  RTC as needed or if symptoms persist.

## 2013-03-26 NOTE — Addendum Note (Signed)
Addended by: Roena Malady on: 03/26/2013 04:54 PM   Modules accepted: Orders

## 2013-03-27 ENCOUNTER — Other Ambulatory Visit: Payer: Self-pay

## 2013-03-27 MED ORDER — DOFETILIDE 500 MCG PO CAPS: 500.0000 ug | ORAL_CAPSULE | Freq: Two times a day (BID) | ORAL | Status: DC

## 2013-03-31 ENCOUNTER — Ambulatory Visit (INDEPENDENT_AMBULATORY_CARE_PROVIDER_SITE_OTHER): Payer: Medicare Other | Admitting: Pharmacist

## 2013-03-31 DIAGNOSIS — Z5181 Encounter for therapeutic drug level monitoring: Secondary | ICD-10-CM | POA: Diagnosis not present

## 2013-03-31 DIAGNOSIS — I4891 Unspecified atrial fibrillation: Secondary | ICD-10-CM

## 2013-03-31 DIAGNOSIS — Z7901 Long term (current) use of anticoagulants: Secondary | ICD-10-CM | POA: Diagnosis not present

## 2013-03-31 LAB — POCT INR: INR: 3.2

## 2013-04-14 ENCOUNTER — Ambulatory Visit (INDEPENDENT_AMBULATORY_CARE_PROVIDER_SITE_OTHER): Payer: Medicare Other | Admitting: *Deleted

## 2013-04-14 DIAGNOSIS — Z7901 Long term (current) use of anticoagulants: Secondary | ICD-10-CM | POA: Diagnosis not present

## 2013-04-14 DIAGNOSIS — I4891 Unspecified atrial fibrillation: Secondary | ICD-10-CM

## 2013-04-14 DIAGNOSIS — Z5181 Encounter for therapeutic drug level monitoring: Secondary | ICD-10-CM | POA: Diagnosis not present

## 2013-04-14 LAB — POCT INR: INR: 2.6

## 2013-04-21 ENCOUNTER — Other Ambulatory Visit: Payer: Self-pay | Admitting: Cardiology

## 2013-05-12 ENCOUNTER — Ambulatory Visit (INDEPENDENT_AMBULATORY_CARE_PROVIDER_SITE_OTHER): Payer: Medicare Other | Admitting: Pharmacist

## 2013-05-12 DIAGNOSIS — Z5181 Encounter for therapeutic drug level monitoring: Secondary | ICD-10-CM

## 2013-05-12 DIAGNOSIS — I4891 Unspecified atrial fibrillation: Secondary | ICD-10-CM

## 2013-05-12 DIAGNOSIS — Z7901 Long term (current) use of anticoagulants: Secondary | ICD-10-CM | POA: Diagnosis not present

## 2013-05-12 LAB — POCT INR: INR: 3

## 2013-05-27 DIAGNOSIS — H2510 Age-related nuclear cataract, unspecified eye: Secondary | ICD-10-CM | POA: Diagnosis not present

## 2013-06-05 DIAGNOSIS — H2510 Age-related nuclear cataract, unspecified eye: Secondary | ICD-10-CM | POA: Diagnosis not present

## 2013-06-09 ENCOUNTER — Ambulatory Visit (INDEPENDENT_AMBULATORY_CARE_PROVIDER_SITE_OTHER): Payer: Medicare Other | Admitting: *Deleted

## 2013-06-09 DIAGNOSIS — Z7901 Long term (current) use of anticoagulants: Secondary | ICD-10-CM

## 2013-06-09 DIAGNOSIS — Z5181 Encounter for therapeutic drug level monitoring: Secondary | ICD-10-CM | POA: Diagnosis not present

## 2013-06-09 DIAGNOSIS — I4891 Unspecified atrial fibrillation: Secondary | ICD-10-CM

## 2013-06-09 LAB — POCT INR: INR: 3.3

## 2013-06-15 DIAGNOSIS — H2589 Other age-related cataract: Secondary | ICD-10-CM | POA: Diagnosis not present

## 2013-06-15 DIAGNOSIS — H2510 Age-related nuclear cataract, unspecified eye: Secondary | ICD-10-CM | POA: Diagnosis not present

## 2013-06-15 DIAGNOSIS — H269 Unspecified cataract: Secondary | ICD-10-CM | POA: Diagnosis not present

## 2013-07-06 ENCOUNTER — Ambulatory Visit (INDEPENDENT_AMBULATORY_CARE_PROVIDER_SITE_OTHER): Payer: Medicare Other | Admitting: *Deleted

## 2013-07-06 DIAGNOSIS — Z5181 Encounter for therapeutic drug level monitoring: Secondary | ICD-10-CM | POA: Diagnosis not present

## 2013-07-06 DIAGNOSIS — Z7901 Long term (current) use of anticoagulants: Secondary | ICD-10-CM | POA: Diagnosis not present

## 2013-07-06 DIAGNOSIS — I4891 Unspecified atrial fibrillation: Secondary | ICD-10-CM

## 2013-07-06 LAB — POCT INR: INR: 2.7

## 2013-07-22 ENCOUNTER — Other Ambulatory Visit: Payer: Self-pay | Admitting: Cardiology

## 2013-08-03 ENCOUNTER — Ambulatory Visit (INDEPENDENT_AMBULATORY_CARE_PROVIDER_SITE_OTHER): Payer: Medicare Other | Admitting: Pharmacist

## 2013-08-03 DIAGNOSIS — I4891 Unspecified atrial fibrillation: Secondary | ICD-10-CM

## 2013-08-03 DIAGNOSIS — Z7901 Long term (current) use of anticoagulants: Secondary | ICD-10-CM | POA: Diagnosis not present

## 2013-08-03 DIAGNOSIS — Z5181 Encounter for therapeutic drug level monitoring: Secondary | ICD-10-CM | POA: Diagnosis not present

## 2013-08-03 LAB — POCT INR: INR: 2.7

## 2013-09-01 ENCOUNTER — Other Ambulatory Visit: Payer: Self-pay | Admitting: Cardiology

## 2013-09-03 ENCOUNTER — Other Ambulatory Visit: Payer: Self-pay | Admitting: *Deleted

## 2013-09-03 MED ORDER — WARFARIN SODIUM 5 MG PO TABS: 5.0000 mg | ORAL_TABLET | ORAL | Status: DC

## 2013-09-07 ENCOUNTER — Ambulatory Visit (INDEPENDENT_AMBULATORY_CARE_PROVIDER_SITE_OTHER): Payer: Medicare Other

## 2013-09-07 DIAGNOSIS — I4891 Unspecified atrial fibrillation: Secondary | ICD-10-CM

## 2013-09-07 DIAGNOSIS — Z5181 Encounter for therapeutic drug level monitoring: Secondary | ICD-10-CM

## 2013-09-07 DIAGNOSIS — Z7901 Long term (current) use of anticoagulants: Secondary | ICD-10-CM | POA: Diagnosis not present

## 2013-09-07 LAB — POCT INR: INR: 2.7

## 2013-09-15 ENCOUNTER — Other Ambulatory Visit: Payer: Self-pay | Admitting: Cardiology

## 2013-09-16 ENCOUNTER — Telehealth: Payer: Self-pay | Admitting: Cardiology

## 2013-09-16 ENCOUNTER — Other Ambulatory Visit: Payer: Self-pay | Admitting: *Deleted

## 2013-09-16 NOTE — Telephone Encounter (Signed)
error 

## 2013-09-22 ENCOUNTER — Inpatient Hospital Stay (HOSPITAL_COMMUNITY)
Admission: EM | Admit: 2013-09-22 | Discharge: 2013-09-25 | DRG: 281 | Disposition: A | Discharge status: Home / Self Care | Payer: Medicare Other | Attending: Cardiology | Admitting: Cardiology

## 2013-09-22 ENCOUNTER — Emergency Department (HOSPITAL_COMMUNITY): Payer: Medicare Other

## 2013-09-22 ENCOUNTER — Encounter (HOSPITAL_COMMUNITY): Payer: Self-pay | Admitting: Emergency Medicine

## 2013-09-22 DIAGNOSIS — R072 Precordial pain: Secondary | ICD-10-CM | POA: Diagnosis not present

## 2013-09-22 DIAGNOSIS — Z96659 Presence of unspecified artificial knee joint: Secondary | ICD-10-CM | POA: Diagnosis not present

## 2013-09-22 DIAGNOSIS — I472 Ventricular tachycardia, unspecified: Secondary | ICD-10-CM | POA: Diagnosis not present

## 2013-09-22 DIAGNOSIS — G4733 Obstructive sleep apnea (adult) (pediatric): Secondary | ICD-10-CM | POA: Diagnosis present

## 2013-09-22 DIAGNOSIS — I4891 Unspecified atrial fibrillation: Secondary | ICD-10-CM | POA: Diagnosis present

## 2013-09-22 DIAGNOSIS — Z9861 Coronary angioplasty status: Secondary | ICD-10-CM | POA: Diagnosis not present

## 2013-09-22 DIAGNOSIS — I2510 Atherosclerotic heart disease of native coronary artery without angina pectoris: Secondary | ICD-10-CM | POA: Diagnosis not present

## 2013-09-22 DIAGNOSIS — Z7901 Long term (current) use of anticoagulants: Secondary | ICD-10-CM

## 2013-09-22 DIAGNOSIS — M1990 Unspecified osteoarthritis, unspecified site: Secondary | ICD-10-CM

## 2013-09-22 DIAGNOSIS — I214 Non-ST elevation (NSTEMI) myocardial infarction: Secondary | ICD-10-CM | POA: Diagnosis not present

## 2013-09-22 DIAGNOSIS — R0789 Other chest pain: Secondary | ICD-10-CM | POA: Diagnosis not present

## 2013-09-22 DIAGNOSIS — I4819 Other persistent atrial fibrillation: Secondary | ICD-10-CM | POA: Diagnosis present

## 2013-09-22 DIAGNOSIS — M810 Age-related osteoporosis without current pathological fracture: Secondary | ICD-10-CM | POA: Diagnosis present

## 2013-09-22 DIAGNOSIS — G4730 Sleep apnea, unspecified: Secondary | ICD-10-CM

## 2013-09-22 DIAGNOSIS — I2489 Other forms of acute ischemic heart disease: Secondary | ICD-10-CM

## 2013-09-22 DIAGNOSIS — E785 Hyperlipidemia, unspecified: Secondary | ICD-10-CM | POA: Diagnosis present

## 2013-09-22 DIAGNOSIS — I248 Other forms of acute ischemic heart disease: Secondary | ICD-10-CM

## 2013-09-22 DIAGNOSIS — I4729 Other ventricular tachycardia: Secondary | ICD-10-CM | POA: Diagnosis not present

## 2013-09-22 DIAGNOSIS — I10 Essential (primary) hypertension: Secondary | ICD-10-CM | POA: Diagnosis present

## 2013-09-22 DIAGNOSIS — Z6841 Body Mass Index (BMI) 40.0 and over, adult: Secondary | ICD-10-CM

## 2013-09-22 DIAGNOSIS — I480 Paroxysmal atrial fibrillation: Secondary | ICD-10-CM

## 2013-09-22 DIAGNOSIS — I5032 Chronic diastolic (congestive) heart failure: Secondary | ICD-10-CM

## 2013-09-22 DIAGNOSIS — I428 Other cardiomyopathies: Secondary | ICD-10-CM | POA: Diagnosis not present

## 2013-09-22 DIAGNOSIS — Z79899 Other long term (current) drug therapy: Secondary | ICD-10-CM | POA: Diagnosis not present

## 2013-09-22 DIAGNOSIS — I509 Heart failure, unspecified: Secondary | ICD-10-CM | POA: Diagnosis not present

## 2013-09-22 DIAGNOSIS — R079 Chest pain, unspecified: Secondary | ICD-10-CM | POA: Diagnosis not present

## 2013-09-22 DIAGNOSIS — I422 Other hypertrophic cardiomyopathy: Secondary | ICD-10-CM

## 2013-09-22 DIAGNOSIS — E7800 Pure hypercholesterolemia, unspecified: Secondary | ICD-10-CM

## 2013-09-22 DIAGNOSIS — I252 Old myocardial infarction: Secondary | ICD-10-CM

## 2013-09-22 HISTORY — DX: Non-ST elevation (NSTEMI) myocardial infarction: I21.4

## 2013-09-22 LAB — TROPONIN I: TROPONIN I: 1.37 ng/mL — AB (ref <0.30)

## 2013-09-22 LAB — CBC WITH DIFFERENTIAL/PLATELET
BASOS ABS: 0 10*3/uL (ref 0.0–0.1)
BASOS PCT: 0 % (ref 0–1)
EOS ABS: 0.1 10*3/uL (ref 0.0–0.7)
EOS PCT: 1 % (ref 0–5)
HEMATOCRIT: 42 % (ref 36.0–46.0)
Hemoglobin: 13.8 g/dL (ref 12.0–15.0)
Lymphocytes Relative: 16 % (ref 12–46)
Lymphs Abs: 1.4 10*3/uL (ref 0.7–4.0)
MCH: 27.9 pg (ref 26.0–34.0)
MCHC: 32.9 g/dL (ref 30.0–36.0)
MCV: 85 fL (ref 78.0–100.0)
MONO ABS: 0.5 10*3/uL (ref 0.1–1.0)
Monocytes Relative: 5 % (ref 3–12)
Neutro Abs: 7.3 10*3/uL (ref 1.7–7.7)
Neutrophils Relative %: 78 % — ABNORMAL HIGH (ref 43–77)
PLATELETS: 230 10*3/uL (ref 150–400)
RBC: 4.94 MIL/uL (ref 3.87–5.11)
RDW: 13.5 % (ref 11.5–15.5)
WBC: 9.3 10*3/uL (ref 4.0–10.5)

## 2013-09-22 LAB — BASIC METABOLIC PANEL
Anion gap: 14 (ref 5–15)
BUN: 18 mg/dL (ref 6–23)
CALCIUM: 9.4 mg/dL (ref 8.4–10.5)
CHLORIDE: 101 meq/L (ref 96–112)
CO2: 23 mEq/L (ref 19–32)
Creatinine, Ser: 1 mg/dL (ref 0.50–1.10)
GFR calc Af Amer: 65 mL/min — ABNORMAL LOW (ref >90)
GFR calc non Af Amer: 56 mL/min — ABNORMAL LOW (ref >90)
Glucose, Bld: 110 mg/dL — ABNORMAL HIGH (ref 70–99)
Potassium: 4.1 mEq/L (ref 3.7–5.3)
Sodium: 138 mEq/L (ref 137–147)

## 2013-09-22 LAB — PROTIME-INR
INR: 2.5 — ABNORMAL HIGH (ref 0.00–1.49)
PROTHROMBIN TIME: 27 s — AB (ref 11.6–15.2)

## 2013-09-22 MED ORDER — NITROGLYCERIN 2 % TD OINT
0.5000 [in_us] | TOPICAL_OINTMENT | Freq: Four times a day (QID) | TRANSDERMAL | Status: DC
  Administered 2013-09-22 – 2013-09-23 (×2): 0.5 [in_us] via TOPICAL
  Filled 2013-09-22 (×28): qty 30
  Filled 2013-09-22: qty 1
  Filled 2013-09-22: qty 30

## 2013-09-22 NOTE — ED Provider Notes (Signed)
CSN: [id number]     Arrival date & time 09/22/13  1640 History   First MD Initiated Contact with Patient 09/22/13 1651     Chief Complaint  Patient presents with  . Chest Pain     (Consider location/radiation/quality/duration/timing/severity/associated sxs/prior Treatment) Patient is a 68 y.o. female presenting with chest pain. The history is provided by the patient. No language interpreter was used.  Chest Pain Pain location:  Substernal area Pain quality: tightness   Radiates to: throat. Pain radiates to the back: no   Pain severity:  Moderate Onset quality:  Sudden Duration:  3 hours Timing:  Constant Progression:  Resolved Chronicity:  Recurrent Context: at rest   Relieved by: tikosyn, asa 1 Sl NTG. Worsened by:  Nothing tried Ineffective treatments:  None tried Associated symptoms: diaphoresis and nausea   Associated symptoms: no abdominal pain, no back pain, no cough, no fatigue, no fever, no headache, no lower extremity edema, no numbness, no palpitations, no shortness of breath, no syncope, not vomiting and no weakness   Risk factors: aortic disease, coronary artery disease, high cholesterol and hypertension     Past Medical History  Diagnosis Date  . Hyperlipidemia   . Hypertension   . Osteoporosis   . Atrial fibrillation     a. s/p multiple cardioversions;  b. previously on Tikosyn lower dose - ineffective, admitted 08/2011 with loading at BID with DCCV at that time;  c. Rate controlled & on coumadin  . CAD (coronary artery disease) 07/2008    a. 07/2008 NSTEMI ->Cath: LM nl, LAD 70p/90p, 60m, LCX nl, RCA nl, EF 60%.  The LAD was stented with a 2.25x25mm Veri-Flex BMS  . Duodenitis   . Headache(784.0)   . Chronic diastolic CHF (congestive heart failure)     a. 07/2008 Echo: EF 60-65%, Triv AI/MR, Mild TR  . Morbid obesity   . Difficult intubation   . History of positive PPD   . Arthritis    Past Surgical History  Procedure Laterality Date  .  Cholecystectomy    . Abdominal hysterectomy      partial, bleeding  . Knee arthroscopy  2000 & 2001    left and right  . Esophagogastroduodenoscopy  2003  . Coronary stent placement    . Tubal ligation    . Total knee arthroplasty      bilateral   Family History  Problem Relation Age of Onset  . Other Mother     GI Bleed  . Pneumonia Father   . Coronary artery disease Brother   . Breast cancer Paternal Grandmother   . Rectal cancer Paternal Grandfather    History  Substance Use Topics  . Smoking status: Never Smoker   . Smokeless tobacco: Never Used  . Alcohol Use: No   OB History   Grav Para Term Preterm Abortions TAB SAB Ect Mult Living                 Review of Systems  Constitutional: Positive for diaphoresis. Negative for fever, chills, activity change, appetite change and fatigue.  HENT: Negative for congestion, facial swelling, rhinorrhea and sore throat.   Eyes: Negative for photophobia and discharge.  Respiratory: Negative for cough, chest tightness and shortness of breath.   Cardiovascular: Positive for chest pain. Negative for palpitations, leg swelling and syncope.  Gastrointestinal: Positive for nausea. Negative for vomiting, abdominal pain and diarrhea.  Endocrine: Negative for polydipsia and polyuria.  Genitourinary: Negative for dysuria, frequency, difficulty urinating and pelvic  pain.  Musculoskeletal: Negative for arthralgias, back pain, neck pain and neck stiffness.  Skin: Negative for color change and wound.  Allergic/Immunologic: Negative for immunocompromised state.  Neurological: Negative for facial asymmetry, weakness, numbness and headaches.  Hematological: Does not bruise/bleed easily.  Psychiatric/Behavioral: Negative for confusion and agitation.      Allergies  Rosuvastatin  Home Medications   Prior to Admission medications   Medication Sig Start Date End Date Taking? Authorizing Provider  acetaminophen (TYLENOL) 500 MG tablet Take  1,000 mg by mouth every 6 (six) hours as needed for headache.   Yes Historical Provider, MD  dofetilide (TIKOSYN) 500 MCG capsule Take 500 mcg by mouth every 12 (twelve) hours.   Yes Historical Provider, MD  furosemide (LASIX) 20 MG tablet Take 20 mg by mouth daily.   Yes Historical Provider, MD  magnesium oxide (MAG-OX) 400 MG tablet Take 400 mg by mouth daily.   Yes Historical Provider, MD  metoprolol (LOPRESSOR) 50 MG tablet Take 75 mg by mouth 2 (two) times daily.   Yes Historical Provider, MD  warfarin (COUMADIN) 5 MG tablet Take 2.5 mg by mouth daily.  02/25/13  Yes Rollene RotundaJames Hochrein, MD   BP 109/47  Pulse 54  Temp(Src) 98.4 F (36.9 C) (Oral)  Resp 18  Ht 5\' 5"  (1.651 m)  Wt 274 lb 1.6 oz (124.331 kg)  BMI 45.61 kg/m2  SpO2 97% Physical Exam  Constitutional: She is oriented to person, place, and time. She appears well-developed and well-nourished. No distress.  HENT:  Head: Normocephalic and atraumatic.  Mouth/Throat: No oropharyngeal exudate.  Eyes: Pupils are equal, round, and reactive to light.  Neck: Normal range of motion. Neck supple.  Cardiovascular: Normal rate, regular rhythm and normal heart sounds.  Exam reveals no gallop and no friction rub.   No murmur heard. Pulmonary/Chest: Effort normal and breath sounds normal. No respiratory distress. She has no wheezes. She has no rales.  Abdominal: Soft. Bowel sounds are normal. She exhibits no distension and no mass. There is no tenderness. There is no rebound and no guarding.  Musculoskeletal: Normal range of motion. She exhibits edema (2+ BLLE). She exhibits no tenderness.  Neurological: She is alert and oriented to person, place, and time.  Skin: Skin is warm and dry.  Psychiatric: She has a normal mood and affect.    ED Course  Procedures (including critical care time) Labs Review Labs Reviewed  CBC WITH DIFFERENTIAL - Abnormal; Notable for the following:    Neutrophils Relative % 78 (*)    All other components  within normal limits  BASIC METABOLIC PANEL - Abnormal; Notable for the following:    Glucose, Bld 110 (*)    GFR calc non Af Amer 56 (*)    GFR calc Af Amer 65 (*)    All other components within normal limits  PROTIME-INR - Abnormal; Notable for the following:    Prothrombin Time 27.0 (*)    INR 2.50 (*)    All other components within normal limits  TROPONIN I - Abnormal; Notable for the following:    Troponin I 1.37 (*)    All other components within normal limits  TROPONIN I - Abnormal; Notable for the following:    Troponin I 4.48 (*)    All other components within normal limits  TROPONIN I - Abnormal; Notable for the following:    Troponin I 2.25 (*)    All other components within normal limits  HEMOGLOBIN A1C - Abnormal; Notable for the  following:    Hemoglobin A1C 5.7 (*)    Mean Plasma Glucose 117 (*)    All other components within normal limits  BASIC METABOLIC PANEL - Abnormal; Notable for the following:    GFR calc non Af Amer 59 (*)    GFR calc Af Amer 69 (*)    All other components within normal limits  LIPID PANEL - Abnormal; Notable for the following:    Cholesterol 205 (*)    LDL Cholesterol 140 (*)    All other components within normal limits  PROTIME-INR - Abnormal; Notable for the following:    Prothrombin Time 25.8 (*)    INR 2.36 (*)    All other components within normal limits  TSH  CBC  MAGNESIUM  TROPONIN I  Rosezena Sensor, ED    Imaging Review Dg Chest 2 View  09/22/2013   CLINICAL DATA:  Chest pain.  Atrial fibrillation.  EXAM: CHEST  2 VIEW  COMPARISON:  02/07/2013 and 06/27/1998  FINDINGS: There is cardiomegaly with slight pulmonary vascular prominence. No consolidative infiltrates or effusions. No acute osseous abnormality.  IMPRESSION: Cardiomegaly with mild pulmonary vascular congestion.   Electronically Signed   By: Geanie Cooley M.D.   On: 09/22/2013 18:18     EKG Interpretation None      MDM   Final diagnoses:  NSTEMI (non-ST  elevated myocardial infarction)  Paroxysmal atrial fibrillation  Chronic diastolic CHF (congestive heart failure)  Coronary atherosclerosis of unspecified type of vessel, native or graft  Long term (current) use of anticoagulants  Obesity, morbid  Postsurgical percutaneous transluminal coronary angioplasty status  Pure hypercholesterolemia  Unspecified sleep apnea    Pt is a 68 y.o. female with Pmhx as above including paroxysmal afib and CAD who presents with about 3 hrs of sudden onset chest tightness, throat tightness, diaphoresis today. She took her home tikosyn, was given 324 ASA and 1SL NTG by EMS. She states symptoms similar to episodes of afib though normally doesn't last that long. She reports her HR was 139 during episode. Upon my exam, Cp resolved. VSS, pt in NAD. Nitro paste placed. EKG with more pronounced ST depression in inferior and precordial leads. First trop + at 1.37.  CXR w/ cardiomegaly and mild vascular congestion. Cards consulted and will admit for NSTEMI.         Toy Cookey, MD 09/23/13 [phone]

## 2013-09-22 NOTE — H&P (Signed)
Patient ID: [name] MRN: [id number], DOB/AGE: 1945-09-06   Admit date: 09/22/2013   Primary Physician: Ruthe Mannan, MD Primary Cardiologist: Dr. Antoine Poche  Pt. Profile:  68F with atrial fibrillation on warfarin and tikosyn, chronic diastolic CHF, HLD, HTN, OSA on CPAP, and CAD s/p LAD PCI (2010) who presents with symptomatic AF recurrence and NSTEMI.   Problem List  Past Medical History  Diagnosis Date  . Hyperlipidemia   . Hypertension   . Osteoporosis   . Atrial fibrillation     a. s/p multiple cardioversions;  b. previously on Tikosyn lower dose - ineffective, admitted 08/2011 with loading at BID with DCCV at that time;  c. Rate controlled & on coumadin  . CAD (coronary artery disease) 07/2008    a. 07/2008 NSTEMI ->Cath: LM nl, LAD 70p/90p, 97m, LCX nl, RCA nl, EF 60%.  The LAD was stented with a 2.25x40mm Veri-Flex BMS  . Duodenitis   . Headache(784.0)   . Chronic diastolic CHF (congestive heart failure)     a. 07/2008 Echo: EF 60-65%, Triv AI/MR, Mild TR  . Morbid obesity   . Difficult intubation   . History of positive PPD   . Arthritis     Past Surgical History  Procedure Laterality Date  . Cholecystectomy    . Abdominal hysterectomy      partial, bleeding  . Knee arthroscopy  2000 & 2001    left and right  . Esophagogastroduodenoscopy  2003  . Coronary stent placement    . Tubal ligation    . Total knee arthroplasty      bilateral     Allergies  Allergies  Allergen Reactions  . Rosuvastatin Other (See Comments)    REACTION: muscle and joint pain    HPI 68F with atrial fibrillation on warfarin and tikosyn, chronic diastolic CHF, HLD, HTN, OSA on CPAP, and CAD s/p LAD PCI (2010) who presents with symptomatic AF recurrence and NSTEMI.   [name] reports that at 1pm today she "went into AF."  She is very familiar with her AF symptoms at states that she typically has brief episodes lasting a few minutes every month or so. She has noted more  recurrences - about 3 over the past 2 weeks - which she attributes to stress surrounding her husbands death 3 weeks ago. [name] reports that today she experienced tightness in her chest and throat with associated clamminess and cold sweats. She used her home BP monitor to document a HR of 138 and BO if 168/"139". This episode persisted longer than usual and she called EMS.   On arrival, EMS gave [name] 324mg  ASA and SL NTG which improved her pain and apparently coincided with a return to sinus rhythm. The episode lasted for a total of 3 hours. The 1st ECG by EMS documented NSR. On arrival to the ER, [name] remained in NSR and had stable repol abnormalities (likely LVH related, see below). Labs were notable for TnI 1.37, INR 2.5, K 4.1.   When asked about the symptoms [name] felt prior to her MI in 2010, she reported she had "very little symptoms.Marland KitchenMarland Kitchenpretty much what I had today."  Of note, [name] has required 4 cardioversions in the past. She failed dofetilide at and is currently having breakthrough AF on dofetilide BID. An echo on 07/05/11 revealed normal EF and a  Lexiscan Myoview on 07/04/11 did not demonstrate ischemia or infarct. She has tolerated warfarin well with INRs  typically in range and without bleeding episodes. She is compliant with her CPAP. She avoids alcohol and tobacco although does have sodas.   Past Medical History  Diagnosis Date  . Hyperlipidemia   . Hypertension   . Osteoporosis   . Atrial fibrillation     a. s/p multiple cardioversions;  b. previously on Tikosyn lower dose - ineffective, admitted 08/2011 with loading at 500mcg BID with DCCV at that time;  c. Rate controlled & on coumadin  . CAD (coronary artery disease) 07/2008    a. 07/2008 NSTEMI ->Cath: LM nl, LAD 70p/90p, 3231m, LCX nl, RCA nl, EF 60%.  The LAD was stented with a 2.25x5320mm Veri-Flex BMS  . Duodenitis   . Headache(784.0)   . Chronic diastolic CHF (congestive heart failure)     a. 07/2008  Echo: EF 60-65%, Triv AI/MR, Mild TR  . Morbid obesity   . Difficult intubation   . History of positive PPD   . Arthritis     Home Medications  Prior to Admission medications   Medication Sig Start Date End Date Taking? Authorizing Provider  acetaminophen (TYLENOL) 500 MG tablet Take 1,000 mg by mouth every 6 (six) hours as needed for headache.   Yes Historical Provider, MD  dofetilide (TIKOSYN) 500 MCG capsule Take 500 mcg by mouth every 12 (twelve) hours.   Yes Historical Provider, MD  furosemide (LASIX) 20 MG tablet Take 20 mg by mouth daily.   Yes Historical Provider, MD  magnesium oxide (MAG-OX) 400 MG tablet Take 400 mg by mouth daily.   Yes Historical Provider, MD  metoprolol (LOPRESSOR) 50 MG tablet Take 75 mg by mouth 2 (two) times daily.   Yes Historical Provider, MD  warfarin (COUMADIN) 5 MG tablet Take 2.5 mg by mouth daily.  02/25/13  Yes Rollene RotundaJames Hochrein, MD    Family History  Family History  Problem Relation Age of Onset  . Other Mother     GI Bleed  . Pneumonia Father   . Coronary artery disease Brother   . Breast cancer Paternal Grandmother   . Rectal cancer Paternal Grandfather     Social History  History   Social History  . Marital Status: Married    Spouse Name: N/A    Number of Children: 2  . Years of Education: N/A   Occupational History  . retired    Social History Main Topics  . Smoking status: Never Smoker   . Smokeless tobacco: Never Used  . Alcohol Use: No  . Drug Use: No  . Sexual Activity: Not Currently    Birth Control/ Protection: Post-menopausal   Other Topics Concern  . Not on file   Social History Narrative  . No narrative on file     Review of Systems General:  No chills, fever, night sweats or weight changes.  Cardiovascular:  See HPI. No PND, orthopnea, LE edema, syncope Dermatological: No rash, lesions/masses Respiratory: No cough. Urologic: No hematuria, dysuria Abdominal:   No nausea, vomiting, diarrhea, bright red  blood per rectum, melena, or hematemesis Neurologic:  No visual changes, wkns, changes in mental status. All other systems reviewed and are otherwise negative except as noted above.  Physical Exam  Blood pressure 120/48, pulse 54, temperature 98.2 F (36.8 C), temperature source Oral, resp. rate 22, height 5\' 5"  (1.651 m), weight 124.739 kg (275 lb), SpO2 96.00%.  General: Pleasant, NAD, obese Psych: Normal affect. Neuro: Alert and oriented X 3. Moves all extremities spontaneously. HEENT: Normal  Neck: Supple  without bruits or JVD. Lungs:  Resp regular and unlabored, CTA. Heart: RRR no s3, s4. Systolic crescendo decrescendo murmur at USB Abdomen: Soft, non-tender, non-distended, BS + x 4.  Extremities: No clubbing, cyanosis or edema. DP/PT/Radials 2+ and equal bilaterally.  Labs  Troponin (Point of Care Test) No results found for this basename: TROPIPOC,  in the last 72 hours  Recent Labs  09/22/13 1839  TROPONINI 1.37*   Lab Results  Component Value Date   WBC 9.3 09/22/2013   HGB 13.8 09/22/2013   HCT 42.0 09/22/2013   MCV 85.0 09/22/2013   PLT 230 09/22/2013    Recent Labs Lab 09/22/13 1839  NA 138  K 4.1  CL 101  CO2 23  BUN 18  CREATININE 1.00  CALCIUM 9.4  GLUCOSE 110*   Lab Results  Component Value Date   CHOL  Value: 220        ATP III CLASSIFICATION:  <200     mg/dL   Desirable  161-096  mg/dL   Borderline High  >=045    mg/dL   High       * 05/14/8117   HDL 40 07/23/2008   LDLCALC  Value: 146        Total Cholesterol/HDL:CHD Risk Coronary Heart Disease Risk Table                     Men   Women  1/2 Average Risk   3.4   3.3  Average Risk       5.0   4.4  2 X Average Risk   9.6   7.1  3 X Average Risk  23.4   11.0        Use the calculated Patient Ratio above and the CHD Risk Table to determine the patient's CHD Risk.        ATP III CLASSIFICATION (LDL):  <100     mg/dL   Optimal  147-829  mg/dL   Near or Above                    Optimal  130-159  mg/dL    Borderline  562-130  mg/dL   High  >865     mg/dL   Very High* 7/84/6962   TRIG 169* 07/23/2008   No results found for this basename: DDIMER     Radiology/Studies  Dg Chest 2 View  09/22/2013   CLINICAL DATA:  Chest pain.  Atrial fibrillation.  EXAM: CHEST  2 VIEW  COMPARISON:  02/07/2013 and 06/27/1998  FINDINGS: There is cardiomegaly with slight pulmonary vascular prominence. No consolidative infiltrates or effusions. No acute osseous abnormality.  IMPRESSION: Cardiomegaly with mild pulmonary vascular congestion.   Electronically Signed   By: Geanie Cooley M.D.   On: 09/22/2013 18:18   TTE 07/05/11 - Left ventricle: Small LV cavity with obliteration of apex in systole. Inferobasal hypokinesis The cavity size was normal. Wall thickness was increased in a pattern of moderate LVH. The estimated ejection fraction was 55%. - Left atrium: The atrium was mildly dilated. - Atrial septum: No defect or patent foramen ovale was identified. Transthoracic echocardiography. M-mode, complete 2D, spectral Doppler, and color Doppler. Height: Height: 165.1cm. Height: 65in. Weight: Weight: 119.8kg. Weight: 263.5lb. Body mass index: BMI: 43.9kg/m^2. Body surface area: BSA: 2.90m^2. Blood pressure: 112/58. Patient status: Outpatient. Location: [facility]  ------------------------------------------------------------ Left ventricle: Small LV cavity with obliteration of apex in systole. Inferobasal hypokinesis The cavity size was  normal. Wall thickness was increased in a pattern of moderate LVH. The estimated ejection fraction was 55%.  ------------------------------------------------------------ Aortic valve: Mildly calcified leaflets.  ------------------------------------------------------------ Aorta: The aorta was normal, not dilated, and non-diseased.  ------------------------------------------------------------ Mitral valve: Mildly thickened leaflets . Doppler: Peak gradient: 4mm Hg  (D). ---------------------------------------------------------- Left atrium: The atrium was mildly dilated. ------------------------------------------------------------ Atrial septum: No defect or patent foramen ovale was identified. ------------------------------------------------------------ Right ventricle: The cavity size was normal. Wall thickness was normal. Systolic function was normal. ------------------------------------------------------------ Pulmonic valve: Doppler: Trivial regurgitation. ------------------------------------------------------------ Tricuspid valve: Doppler: Trivial regurgitation. ------------------------------------------------------------ Right atrium: The atrium was normal in size. ------------------------------------------------------------ Pericardium: The pericardium was normal in appearance. ------------------------------------------------------------ Post procedure conclusions Ascending Aorta:  - The aorta was normal, not dilated, and non-diseased.    ECG 09/22/13 NSR, TWI V3-V5, QTc 493. Probable LVH. Based on prior history of repol abnormalities likely related to LVH,  no acute STTWC c/w acute ischemia 02/07/13 NSR TWI V3-V6, aVF, QTc 436. Probable LVH  ASSESSMENT AND PLAN 28F with atrial fibrillation on warfarin and tikosyn, chronic diastolic CHF, HLD, HTN, and CAD who presents with symptomatic AF recurrence and NSTEMI.   Although this could be a type II MI in the setting of rapid AF, the syndrome was fairly similar to her prior MI and in the setting of multiple risk factors this may very well represent a Type I event as so will treat as such. Her INR is 2.36 and so she will need radial access of cath is opted for today (Wednesday). Given her frequent recurrences of AF it is worth considering a change of therapy and considering catheter ablation.   NSTEMI 1. NPO for possible cath although this could be delayed until INR trends down 2. Cycle  troponins 3. Continue metoprolol  4. Hold warfarin and consider heparin when INR < 2 5. She has a possible reaction to crestor years ago (knee pain that she believes was ultimately due to OA) and is willing to try a different statin - will start atorva 40mg  qHS 6. ASA 81mg  7. Check lipids and A1c  AF 1. Continue Tikosyn 2. Continue metoprolol 3. K>4, Mg>2 4. Check TSH  Signed, Glori Luis, MD 09/22/2013, 9:50 PM

## 2013-09-22 NOTE — ED Notes (Signed)
Pt to department via EMS- pt reports chest pain about 1pm 8/10 while resting. Pt reports a hx of a-fib. Pt took tikosyn about 30 minutes prior to EMS arrival. Pt reports that she normally has a T wave inversion. 324 ASA 1 nitro 2/10. 20 lac. Bp-126/73 Hr-69

## 2013-09-23 DIAGNOSIS — R072 Precordial pain: Secondary | ICD-10-CM

## 2013-09-23 LAB — BASIC METABOLIC PANEL
Anion gap: 12 (ref 5–15)
BUN: 17 mg/dL (ref 6–23)
CO2: 25 meq/L (ref 19–32)
CREATININE: 0.95 mg/dL (ref 0.50–1.10)
Calcium: 9.4 mg/dL (ref 8.4–10.5)
Chloride: 103 mEq/L (ref 96–112)
GFR calc Af Amer: 69 mL/min — ABNORMAL LOW (ref >90)
GFR, EST NON AFRICAN AMERICAN: 59 mL/min — AB (ref >90)
GLUCOSE: 99 mg/dL (ref 70–99)
Potassium: 4 mEq/L (ref 3.7–5.3)
SODIUM: 140 meq/L (ref 137–147)

## 2013-09-23 LAB — PROTIME-INR
INR: 2.36 — AB (ref 0.00–1.49)
PROTHROMBIN TIME: 25.8 s — AB (ref 11.6–15.2)

## 2013-09-23 LAB — LIPID PANEL
CHOL/HDL RATIO: 5 ratio
Cholesterol: 205 mg/dL — ABNORMAL HIGH (ref 0–200)
HDL: 41 mg/dL (ref >39)
LDL CALC: 140 mg/dL — AB (ref 0–99)
Triglycerides: 122 mg/dL (ref <150)
VLDL: 24 mg/dL (ref 0–40)

## 2013-09-23 LAB — CBC
HCT: 39.8 % (ref 36.0–46.0)
HEMOGLOBIN: 13.3 g/dL (ref 12.0–15.0)
MCH: 27.9 pg (ref 26.0–34.0)
MCHC: 33.4 g/dL (ref 30.0–36.0)
MCV: 83.4 fL (ref 78.0–100.0)
PLATELETS: 210 10*3/uL (ref 150–400)
RBC: 4.77 MIL/uL (ref 3.87–5.11)
RDW: 13.5 % (ref 11.5–15.5)
WBC: 9.7 10*3/uL (ref 4.0–10.5)

## 2013-09-23 LAB — MAGNESIUM: MAGNESIUM: 1.6 mg/dL (ref 1.5–2.5)

## 2013-09-23 LAB — TROPONIN I
TROPONIN I: 4.48 ng/mL — AB (ref <0.30)
TROPONIN I: 1.18 ng/mL — AB (ref <0.30)
Troponin I: 2.25 ng/mL (ref <0.30)

## 2013-09-23 LAB — HEMOGLOBIN A1C
Hgb A1c MFr Bld: 5.7 % — ABNORMAL HIGH (ref <5.7)
MEAN PLASMA GLUCOSE: 117 mg/dL — AB (ref <117)

## 2013-09-23 LAB — TSH: TSH: 3.27 u[IU]/mL (ref 0.350–4.500)

## 2013-09-23 MED ORDER — METOPROLOL TARTRATE 50 MG PO TABS
75.0000 mg | ORAL_TABLET | Freq: Two times a day (BID) | ORAL | Status: DC
  Administered 2013-09-23 – 2013-09-25 (×6): 75 mg via ORAL
  Filled 2013-09-23 (×7): qty 1

## 2013-09-23 MED ORDER — DOFETILIDE 500 MCG PO CAPS
500.0000 ug | ORAL_CAPSULE | Freq: Two times a day (BID) | ORAL | Status: DC
  Administered 2013-09-23 – 2013-09-25 (×5): 500 ug via ORAL
  Filled 2013-09-23 (×7): qty 1

## 2013-09-23 MED ORDER — ASPIRIN EC 81 MG PO TBEC
81.0000 mg | DELAYED_RELEASE_TABLET | Freq: Every day | ORAL | Status: DC
  Filled 2013-09-23: qty 1

## 2013-09-23 MED ORDER — ATORVASTATIN CALCIUM 40 MG PO TABS
40.0000 mg | ORAL_TABLET | Freq: Every day | ORAL | Status: DC
  Administered 2013-09-23 – 2013-09-24 (×2): 40 mg via ORAL
  Filled 2013-09-23 (×3): qty 1

## 2013-09-23 MED ORDER — ACETAMINOPHEN 500 MG PO TABS
1000.0000 mg | ORAL_TABLET | Freq: Four times a day (QID) | ORAL | Status: DC | PRN
  Administered 2013-09-23: 1000 mg via ORAL
  Filled 2013-09-23: qty 2

## 2013-09-23 MED ORDER — FUROSEMIDE 20 MG PO TABS
20.0000 mg | ORAL_TABLET | Freq: Every day | ORAL | Status: DC
  Administered 2013-09-23 – 2013-09-25 (×3): 20 mg via ORAL
  Filled 2013-09-23 (×4): qty 1

## 2013-09-23 MED ORDER — NITROGLYCERIN 0.4 MG SL SUBL
0.4000 mg | SUBLINGUAL_TABLET | SUBLINGUAL | Status: DC | PRN
Start: 2013-09-23 — End: 2013-09-25

## 2013-09-23 MED ORDER — ACETAMINOPHEN 325 MG PO TABS
650.0000 mg | ORAL_TABLET | ORAL | Status: DC | PRN
  Administered 2013-09-23: 650 mg via ORAL
  Filled 2013-09-23: qty 2

## 2013-09-23 MED ORDER — MAGNESIUM OXIDE 400 (241.3 MG) MG PO TABS
400.0000 mg | ORAL_TABLET | Freq: Every day | ORAL | Status: DC
  Administered 2013-09-23 – 2013-09-25 (×3): 400 mg via ORAL
  Filled 2013-09-23 (×4): qty 1

## 2013-09-23 MED ORDER — ONDANSETRON HCL 4 MG/2ML IJ SOLN
4.0000 mg | Freq: Four times a day (QID) | INTRAMUSCULAR | Status: DC | PRN
Start: 2013-09-23 — End: 2013-09-25

## 2013-09-23 MED ORDER — METOPROLOL TARTRATE 50 MG PO TABS: 75.0000 mg | ORAL_TABLET | Freq: Two times a day (BID) | ORAL | Status: DC

## 2013-09-23 MED ORDER — PNEUMOCOCCAL VAC POLYVALENT 25 MCG/0.5ML IJ INJ
0.5000 mL | INJECTION | INTRAMUSCULAR | Status: AC
Start: 2013-09-24 — End: 2013-09-24
  Administered 2013-09-24: 0.5 mL via INTRAMUSCULAR
  Filled 2013-09-23: qty 0.5

## 2013-09-23 MED ORDER — ASPIRIN EC 81 MG PO TBEC
81.0000 mg | DELAYED_RELEASE_TABLET | Freq: Every day | ORAL | Status: DC
  Administered 2013-09-23 – 2013-09-25 (×2): 81 mg via ORAL
  Filled 2013-09-23 (×3): qty 1

## 2013-09-23 NOTE — Progress Notes (Signed)
*  PRELIMINARY RESULTS* Echocardiogram 2D Echocardiogram has been performed.  Jeryl Columbia 09/23/2013, 3:03 PM

## 2013-09-23 NOTE — Care Management Note (Unsigned)
    Page 1 of 1   09/24/2013     10:21:46 AM CARE MANAGEMENT NOTE 09/24/2013  Patient:  [name], [name]   Account Number:  [id number]  Date Initiated:  09/23/2013  Documentation initiated by:  GRAVES-BIGELOW,Ridwan Bondy  Subjective/Objective Assessment:   Pt admitted for NStemi. Plan for cath.     Action/Plan:   CM to continue to monitor for disposition needs.   Anticipated DC Date:  09/24/2013   Anticipated DC Plan:  HOME/SELF CARE      DC Planning Services  CM consult      Choice offered to / List presented to:             Status of service:  In process, will continue to follow Medicare Important Message given?  YES (If response is "NO", the following Medicare IM given date fields will be blank) Date Medicare IM given:  09/24/2013 Medicare IM given by:  GRAVES-BIGELOW,Ashyah Quizon Date Additional Medicare IM given:   Additional Medicare IM given by:    Discharge Disposition:    Per UR Regulation:  Reviewed for med. necessity/level of care/duration of stay  If discussed at Long Length of Stay Meetings, dates discussed:    Comments:

## 2013-09-23 NOTE — Progress Notes (Signed)
UR Completed Tarance Balan Graves-Bigelow, RN,BSN [phone]  

## 2013-09-23 NOTE — Progress Notes (Signed)
Subjective: No further chest tightness  Objective: Vital signs in last 24 hours: Temp:  [98 F (36.7 C)-98.4 F (36.9 C)] 98.4 F (36.9 C) (08/19 0609) Pulse Rate:  [54-100] 54 (08/19 0609) Resp:  [15-24] 18 (08/19 0609) BP: (105-132)/(41-60) 109/47 mmHg (08/19 0609) SpO2:  [93 %-99 %] 97 % (08/19 0609) Weight:  [274 lb 1.6 oz (124.331 kg)-275 lb (124.739 kg)] 274 lb 1.6 oz (124.331 kg) (08/19 0609) Last BM Date: 09/22/13  Intake/Output from previous day:   Intake/Output this shift:    Medications Current Facility-Administered Medications  Medication Dose Route Frequency Provider Last Rate Last Dose  . acetaminophen (TYLENOL) tablet 1,000 mg  1,000 mg Oral Q6H PRN Glori Luis, MD   1,000 mg at 09/23/13 0029  . acetaminophen (TYLENOL) tablet 650 mg  650 mg Oral Q4H PRN Glori Luis, MD   650 mg at 09/23/13 1751  . aspirin EC tablet 81 mg  81 mg Oral Daily Rollene Rotunda, MD      . atorvastatin (LIPITOR) tablet 40 mg  40 mg Oral q1800 Glori Luis, MD      . dofetilide [facility]) capsule 500 mcg  500 mcg Oral Q12H Glori Luis, MD      . furosemide (LASIX) tablet 20 mg  20 mg Oral Daily Glori Luis, MD      . magnesium oxide (MAG-OX) tablet 400 mg  400 mg Oral Daily Glori Luis, MD      . metoprolol tartrate (LOPRESSOR) tablet 75 mg  75 mg Oral BID Rollene Rotunda, MD   75 mg at 09/23/13 0125  . nitroGLYCERIN (NITROGLYN) 2 % ointment 0.5 inch  0.5 inch Topical 4 times per day Toy Cookey, MD   0.5 inch at 09/23/13 0125  . nitroGLYCERIN (NITROSTAT) SL tablet 0.4 mg  0.4 mg Sublingual Q5 Min x 3 PRN Glori Luis, MD      . ondansetron [facility]) injection 4 mg  4 mg Intravenous Q6H PRN Glori Luis, MD      . Melene Muller ON 09/24/2013] pneumococcal 23 valent vaccine (PNU-IMMUNE) injection 0.5 mL  0.5 mL Intramuscular Tomorrow-1000 Rollene Rotunda, MD        PE: General appearance: alert, cooperative and no distress Lungs: clear to auscultation  bilaterally Heart: regular rate and rhythm and 1/6 sys MM Extremities: No LEE Pulses: 2+ and symmetric Skin: Warm and dry Neurologic: Grossly normal  Lab Results:   Recent Labs  09/22/13 1839 09/23/13 0100  WBC 9.3 9.7  HGB 13.8 13.3  HCT 42.0 39.8  PLT 230 210   BMET  Recent Labs  09/22/13 1839 09/23/13 0100  NA 138 140  K 4.1 4.0  CL 101 103  CO2 23 25  GLUCOSE 110* 99  BUN 18 17  CREATININE 1.00 0.95  CALCIUM 9.4 9.4   PT/INR  Recent Labs  09/22/13 1839 09/23/13 0100  LABPROT 27.0* 25.8*  INR 2.50* 2.36*   Cholesterol  Recent Labs  09/23/13 0100  CHOL 205*   Lipid Panel     Component Value Date/Time   CHOL 205* 09/23/2013 0100   TRIG 122 09/23/2013 0100   HDL 41 09/23/2013 0100   CHOLHDL 5.0 09/23/2013 0100   VLDL 24 09/23/2013 0100   LDLCALC 140* 09/23/2013 0100     Cardiac Panel (last 3 results)  Recent Labs  09/22/13 1839 09/23/13 0100 09/23/13 0629  TROPONINI 1.37* 4.48* 2.25*      Assessment/Plan 78F with atrial fibrillation on warfarin and tikosyn, chronic diastolic CHF, HLD,  HTN, OSA on CPAP, and CAD s/p LAD PCI (2010) who presents with symptomatic AF recurrence and NSTEMI.    Active Problems:   NSTEMI (non-ST elevated myocardial infarction) Troponin peaked at 4.48. Sounds like demand ischemia when in afib RVR.  TWI on  EKG is not new.  Start IV heparin when INR < 2.0.  2.36 this morning.  On ASA, lipitor, lopressor 75BID.  Left heart cath when INR < 1.80.      HYPERLIPIDEMIA  Lipitor   Obesity, morbid   HYPERTENSION  Controlled   CAD   FIBRILLATION, ATRIAL  Maintaining sinus brady 58.     Chronic diastolic CHF (congestive heart failure)   Appears euvolemic.    LOS: 1 day    HAGER, BRYAN PA-C 09/23/2013 8:25 AM  Personally seen and examined. Agree with above. We'll hold Coumadin. Potentially possible to perform the radial artery catheterization. Obese. Optimal to go radial approach. There could be a component of  demand ischemia however with her prior CAD, stent, I think it is in her best interest to proceed with heart catheterization. Donato SchultzSKAINS, Loa Idler, MD

## 2013-09-24 ENCOUNTER — Encounter (HOSPITAL_COMMUNITY)
Admission: EM | Disposition: A | Discharge status: Home / Self Care | Payer: Self-pay | Source: Home / Self Care | Attending: Cardiology

## 2013-09-24 ENCOUNTER — Encounter (HOSPITAL_COMMUNITY): Payer: Self-pay | Admitting: Cardiology

## 2013-09-24 DIAGNOSIS — I422 Other hypertrophic cardiomyopathy: Secondary | ICD-10-CM | POA: Diagnosis present

## 2013-09-24 DIAGNOSIS — I2510 Atherosclerotic heart disease of native coronary artery without angina pectoris: Secondary | ICD-10-CM

## 2013-09-24 DIAGNOSIS — I214 Non-ST elevation (NSTEMI) myocardial infarction: Secondary | ICD-10-CM | POA: Diagnosis not present

## 2013-09-24 HISTORY — PX: LEFT HEART CATHETERIZATION WITH CORONARY ANGIOGRAM: SHX5451

## 2013-09-24 LAB — BASIC METABOLIC PANEL
ANION GAP: 13 (ref 5–15)
BUN: 16 mg/dL (ref 6–23)
CALCIUM: 9.4 mg/dL (ref 8.4–10.5)
CO2: 23 mEq/L (ref 19–32)
CREATININE: 0.93 mg/dL (ref 0.50–1.10)
Chloride: 102 mEq/L (ref 96–112)
GFR calc non Af Amer: 61 mL/min — ABNORMAL LOW (ref >90)
GFR, EST AFRICAN AMERICAN: 71 mL/min — AB (ref >90)
Glucose, Bld: 100 mg/dL — ABNORMAL HIGH (ref 70–99)
Potassium: 4.2 mEq/L (ref 3.7–5.3)
Sodium: 138 mEq/L (ref 137–147)

## 2013-09-24 LAB — PROTIME-INR
INR: 1.67 — AB (ref 0.00–1.49)
PROTHROMBIN TIME: 19.7 s — AB (ref 11.6–15.2)

## 2013-09-24 LAB — MAGNESIUM: Magnesium: 1.8 mg/dL (ref 1.5–2.5)

## 2013-09-24 SURGERY — LEFT HEART CATHETERIZATION WITH CORONARY ANGIOGRAM
Anesthesia: LOCAL

## 2013-09-24 MED ORDER — FENTANYL CITRATE 0.05 MG/ML IJ SOLN
INTRAMUSCULAR | Status: AC
  Filled 2013-09-24: qty 2

## 2013-09-24 MED ORDER — SODIUM CHLORIDE 0.9 % IJ SOLN: 3.0000 mL | INTRAMUSCULAR | Status: DC | PRN

## 2013-09-24 MED ORDER — LIDOCAINE HCL (PF) 1 % IJ SOLN
INTRAMUSCULAR | Status: AC
  Filled 2013-09-24: qty 30

## 2013-09-24 MED ORDER — MIDAZOLAM HCL 2 MG/2ML IJ SOLN
INTRAMUSCULAR | Status: AC
  Filled 2013-09-24: qty 2

## 2013-09-24 MED ORDER — HEPARIN (PORCINE) IN NACL 100-0.45 UNIT/ML-% IJ SOLN
1200.0000 [IU]/h | INTRAMUSCULAR | Status: DC
  Administered 2013-09-24: 1200 [IU]/h via INTRAVENOUS
  Filled 2013-09-24 (×2): qty 250

## 2013-09-24 MED ORDER — HEPARIN SODIUM (PORCINE) 5000 UNIT/ML IJ SOLN
5000.0000 [IU] | Freq: Three times a day (TID) | INTRAMUSCULAR | Status: DC
  Administered 2013-09-24 – 2013-09-25 (×2): 5000 [IU] via SUBCUTANEOUS
  Filled 2013-09-24 (×3): qty 1

## 2013-09-24 MED ORDER — NITROGLYCERIN 1 MG/10 ML FOR IR/CATH LAB
INTRA_ARTERIAL | Status: AC
  Filled 2013-09-24: qty 10

## 2013-09-24 MED ORDER — SODIUM CHLORIDE 0.9 % IJ SOLN: 3.0000 mL | Freq: Two times a day (BID) | INTRAMUSCULAR | Status: DC

## 2013-09-24 MED ORDER — VERAPAMIL HCL 2.5 MG/ML IV SOLN
INTRAVENOUS | Status: AC
  Filled 2013-09-24: qty 2

## 2013-09-24 MED ORDER — WARFARIN - PHARMACIST DOSING INPATIENT: Freq: Every day | Status: DC

## 2013-09-24 MED ORDER — SODIUM CHLORIDE 0.9 % IV SOLN
1.0000 mL/kg/h | INTRAVENOUS | Status: AC
  Administered 2013-09-24: 1 mL/kg/h via INTRAVENOUS

## 2013-09-24 MED ORDER — HEPARIN (PORCINE) IN NACL 2-0.9 UNIT/ML-% IJ SOLN
INTRAMUSCULAR | Status: AC
  Filled 2013-09-24: qty 1000

## 2013-09-24 MED ORDER — WARFARIN SODIUM 5 MG PO TABS
5.0000 mg | ORAL_TABLET | Freq: Once | ORAL | Status: AC
  Administered 2013-09-24: 5 mg via ORAL
  Filled 2013-09-24: qty 1

## 2013-09-24 MED ORDER — HEPARIN SODIUM (PORCINE) 1000 UNIT/ML IJ SOLN
INTRAMUSCULAR | Status: AC
  Filled 2013-09-24: qty 1

## 2013-09-24 MED ORDER — ASPIRIN 81 MG PO CHEW
81.0000 mg | CHEWABLE_TABLET | ORAL | Status: AC
  Administered 2013-09-24: 81 mg via ORAL
  Filled 2013-09-24: qty 1

## 2013-09-24 MED ORDER — SODIUM CHLORIDE 0.9 % IV SOLN
250.0000 mL | INTRAVENOUS | Status: DC | PRN
Start: 2013-09-24 — End: 2013-09-24

## 2013-09-24 MED ORDER — HEPARIN (PORCINE) IN NACL 2-0.9 UNIT/ML-% IJ SOLN
INTRAMUSCULAR | Status: AC
  Filled 2013-09-24: qty 500

## 2013-09-24 MED ORDER — SODIUM CHLORIDE 0.9 % IV SOLN
250.0000 mL | INTRAVENOUS | Status: DC | PRN
Start: 2013-09-24 — End: 2013-09-25

## 2013-09-24 MED ORDER — SODIUM CHLORIDE 0.9 % IV SOLN
1.0000 mL/kg/h | INTRAVENOUS | Status: DC
  Administered 2013-09-24: 1 mL/kg/h via INTRAVENOUS

## 2013-09-24 MED ORDER — SODIUM CHLORIDE 0.9 % IJ SOLN
3.0000 mL | INTRAMUSCULAR | Status: DC | PRN
Start: 2013-09-24 — End: 2013-09-25

## 2013-09-24 NOTE — Progress Notes (Addendum)
Subjective: No SOB, CP, Dizziness  Objective: Vital signs in last 24 hours: Temp:  [97.7 F (36.5 C)-98.5 F (36.9 C)] 97.7 F (36.5 C) (08/20 0601) Pulse Rate:  [55-62] 58 (08/20 0601) Resp:  [18] 18 (08/20 0601) BP: (122-139)/(56-58) 134/57 mmHg (08/20 0601) SpO2:  [96 %-98 %] 98 % (08/20 0601) FiO2 (%):  [21 %] 21 % (08/19 2243) Weight:  [268 lb 14.4 oz (121.972 kg)] 268 lb 14.4 oz (121.972 kg) (08/20 0601) Last BM Date: 09/23/13  Intake/Output from previous day:   Intake/Output this shift:    Medications Current Facility-Administered Medications  Medication Dose Route Frequency Provider Last Rate Last Dose  . acetaminophen (TYLENOL) tablet 1,000 mg  1,000 mg Oral Q6H PRN Glori Luis, MD   1,000 mg at 09/23/13 0029  . acetaminophen (TYLENOL) tablet 650 mg  650 mg Oral Q4H PRN Glori Luis, MD   650 mg at 09/23/13 5284  . aspirin EC tablet 81 mg  81 mg Oral Daily Rollene Rotunda, MD   81 mg at 09/23/13 1324  . atorvastatin (LIPITOR) tablet 40 mg  40 mg Oral q1800 Glori Luis, MD   40 mg at 09/23/13 2007  . dofetilide (TIKOSYN) capsule 500 mcg  500 mcg Oral Q12H Glori Luis, MD   500 mcg at 09/23/13 2007  . furosemide (LASIX) tablet 20 mg  20 mg Oral Daily Glori Luis, MD   20 mg at 09/23/13 0939  . heparin ADULT infusion 100 units/mL (25000 units/250 mL)  1,200 Units/hr Intravenous Continuous Severiano Gilbert, [facility] 12 mL/hr at 09/24/13 0653 1,200 Units/hr at 09/24/13 0653  . magnesium oxide (MAG-OX) tablet 400 mg  400 mg Oral Daily Glori Luis, MD   400 mg at 09/23/13 0939  . metoprolol tartrate (LOPRESSOR) tablet 75 mg  75 mg Oral BID Rollene Rotunda, MD   75 mg at 09/23/13 2007  . nitroGLYCERIN (NITROGLYN) 2 % ointment 0.5 inch  0.5 inch Topical 4 times per day Toy Cookey, MD   0.5 inch at 09/23/13 0125  . nitroGLYCERIN (NITROSTAT) SL tablet 0.4 mg  0.4 mg Sublingual Q5 Min x 3 PRN Glori Luis, MD      . ondansetron [facility]) injection 4 mg  4  mg Intravenous Q6H PRN Glori Luis, MD      . pneumococcal 23 valent vaccine (PNU-IMMUNE) injection 0.5 mL  0.5 mL Intramuscular Tomorrow-1000 Rollene Rotunda, MD        PE: General appearance: alert, cooperative and no distress Lungs: clear to auscultation bilaterally Heart: regular rate and rhythm, S1, S2 normal,1/6 sys murmur, No click, rub or gallop Extremities: No LEE Pulses: 2+ and symmetric Skin: Warm and dry.  Neurologic: Grossly normal  Lab Results:   Recent Labs  09/22/13 1839 09/23/13 0100  WBC 9.3 9.7  HGB 13.8 13.3  HCT 42.0 39.8  PLT 230 210   BMET  Recent Labs  09/22/13 1839 09/23/13 0100 09/24/13 0518  NA 138 140 138  K 4.1 4.0 4.2  CL 101 103 102  CO2 23 25 23   GLUCOSE 110* 99 100*  BUN 18 17 16   CREATININE 1.00 0.95 0.93  CALCIUM 9.4 9.4 9.4   PT/INR  Recent Labs  09/22/13 1839 09/23/13 0100 09/24/13 0518  LABPROT 27.0* 25.8* 19.7*  INR 2.50* 2.36* 1.67*   Cholesterol  Recent Labs  09/23/13 0100  CHOL 205*   Lipid Panel     Component Value Date/Time   CHOL 205* 09/23/2013 0100   TRIG 122  09/23/2013 0100   HDL 41 09/23/2013 0100   CHOLHDL 5.0 09/23/2013 0100   VLDL 24 09/23/2013 0100   LDLCALC 140* 09/23/2013 0100   Cardiac Panel (last 3 results)  Recent Labs  09/23/13 0100 09/23/13 0629 09/23/13 1244  TROPONINI 4.48* 2.25* 1.18*     Assessment/Plan   Principal Problem:   NSTEMI (non-ST elevated myocardial infarction) Active Problems:   HYPERLIPIDEMIA   Obesity, morbid   HYPERTENSION   CAD   FIBRILLATION, ATRIAL   Chronic diastolic CHF (congestive heart failure)  Plan:    No CP, SOB, Dizziness.  INR 1.67.  Left heart cath today.  Hydrate prior to cath.  14622ml/hr.  EF 55-65%.  On IV heparin, ASA, lipitor, lopressor 75bid, tikosyn [id number].  BP and HR stable .  NSR.      LOS: 2 days    HAGER, BRYAN PA-C 09/24/2013 8:07 AM  Personally seen and examined. Agree with above.  68 year old with paroxysmal atrial  fibrillation on chronic anticoagulation with warfarin, Tikosyn, chronic diastolic heart failure, hyperlipidemia, hypertension, obstructive sleep apnea on CPAP with coronary artery disease status post LAD PCI in 2010 here with recurrent symptomatic atrial fibrillation with concomitant troponin elevation of 4.4 consistent with non-ST elevation myocardial infarction versus demand ischemia.   -Proceeding with left heart catheterization today. INR 1.67. Prefer radial artery approach to reduce bleeding complication risk.  -She is still grieving from the loss of her husband after being married for 55 years. He died approximately 3 weeks ago.  -Discussed with her and her family.  -Post catheterization we will need to resume warfarin.   Donato SchultzSKAINS, MARK, MD

## 2013-09-24 NOTE — Interval H&P Note (Signed)
History and Physical Interval Note:  09/24/2013 2:40 PM  [name]  has presented today for surgery, with the diagnosis of cp  The various methods of treatment have been discussed with the patient and family. After consideration of risks, benefits and other options for treatment, the patient has consented to  Procedure(s): LEFT HEART CATHETERIZATION WITH CORONARY ANGIOGRAM (N/A) as a surgical intervention .  The patient's history has been reviewed, patient examined, no change in status, stable for surgery.  I have reviewed the patient's chart and labs.  Questions were answered to the patient's satisfaction.    Cath Lab Visit (complete for each Cath Lab visit)  Clinical Evaluation Leading to the Procedure:   ACS: Yes.    Non-ACS:    Anginal Classification: CCS IV  Anti-ischemic medical therapy: No Therapy  Non-Invasive Test Results: No non-invasive testing performed  Prior CABG: No previous CABG       Tonny Bollman

## 2013-09-24 NOTE — Progress Notes (Signed)
Right radial TR band removed without complications.  2x2 and tegaderm applied.  Radial site level 0.  Patient given radial site instructions.  Seyon Strader Danielle   

## 2013-09-24 NOTE — H&P (View-Only) (Signed)
Subjective: No SOB, CP, Dizziness  Objective: Vital signs in last 24 hours: Temp:  [97.7 F (36.5 C)-98.5 F (36.9 C)] 97.7 F (36.5 C) (08/20 0601) Pulse Rate:  [55-62] 58 (08/20 0601) Resp:  [18] 18 (08/20 0601) BP: (122-139)/(56-58) 134/57 mmHg (08/20 0601) SpO2:  [96 %-98 %] 98 % (08/20 0601) FiO2 (%):  [21 %] 21 % (08/19 2243) Weight:  [268 lb 14.4 oz (121.972 kg)] 268 lb 14.4 oz (121.972 kg) (08/20 0601) Last BM Date: 09/23/13  Intake/Output from previous day:   Intake/Output this shift:    Medications Current Facility-Administered Medications  Medication Dose Route Frequency Provider Last Rate Last Dose  . acetaminophen (TYLENOL) tablet 1,000 mg  1,000 mg Oral Q6H PRN Glori Luis, MD   1,000 mg at 09/23/13 0029  . acetaminophen (TYLENOL) tablet 650 mg  650 mg Oral Q4H PRN Glori Luis, MD   650 mg at 09/23/13 5284  . aspirin EC tablet 81 mg  81 mg Oral Daily Rollene Rotunda, MD   81 mg at 09/23/13 1324  . atorvastatin (LIPITOR) tablet 40 mg  40 mg Oral q1800 Glori Luis, MD   40 mg at 09/23/13 2007  . dofetilide (TIKOSYN) capsule 500 mcg  500 mcg Oral Q12H Glori Luis, MD   500 mcg at 09/23/13 2007  . furosemide (LASIX) tablet 20 mg  20 mg Oral Daily Glori Luis, MD   20 mg at 09/23/13 0939  . heparin ADULT infusion 100 units/mL (25000 units/250 mL)  1,200 Units/hr Intravenous Continuous Severiano Gilbert, [facility] 12 mL/hr at 09/24/13 0653 1,200 Units/hr at 09/24/13 0653  . magnesium oxide (MAG-OX) tablet 400 mg  400 mg Oral Daily Glori Luis, MD   400 mg at 09/23/13 0939  . metoprolol tartrate (LOPRESSOR) tablet 75 mg  75 mg Oral BID Rollene Rotunda, MD   75 mg at 09/23/13 2007  . nitroGLYCERIN (NITROGLYN) 2 % ointment 0.5 inch  0.5 inch Topical 4 times per day Toy Cookey, MD   0.5 inch at 09/23/13 0125  . nitroGLYCERIN (NITROSTAT) SL tablet 0.4 mg  0.4 mg Sublingual Q5 Min x 3 PRN Glori Luis, MD      . ondansetron [facility]) injection 4 mg  4  mg Intravenous Q6H PRN Glori Luis, MD      . pneumococcal 23 valent vaccine (PNU-IMMUNE) injection 0.5 mL  0.5 mL Intramuscular Tomorrow-1000 Rollene Rotunda, MD        PE: General appearance: alert, cooperative and no distress Lungs: clear to auscultation bilaterally Heart: regular rate and rhythm, S1, S2 normal,1/6 sys murmur, No click, rub or gallop Extremities: No LEE Pulses: 2+ and symmetric Skin: Warm and dry.  Neurologic: Grossly normal  Lab Results:   Recent Labs  09/22/13 1839 09/23/13 0100  WBC 9.3 9.7  HGB 13.8 13.3  HCT 42.0 39.8  PLT 230 210   BMET  Recent Labs  09/22/13 1839 09/23/13 0100 09/24/13 0518  NA 138 140 138  K 4.1 4.0 4.2  CL 101 103 102  CO2 23 25 23   GLUCOSE 110* 99 100*  BUN 18 17 16   CREATININE 1.00 0.95 0.93  CALCIUM 9.4 9.4 9.4   PT/INR  Recent Labs  09/22/13 1839 09/23/13 0100 09/24/13 0518  LABPROT 27.0* 25.8* 19.7*  INR 2.50* 2.36* 1.67*   Cholesterol  Recent Labs  09/23/13 0100  CHOL 205*   Lipid Panel     Component Value Date/Time   CHOL 205* 09/23/2013 0100   TRIG 122  09/23/2013 0100   HDL 41 09/23/2013 0100   CHOLHDL 5.0 09/23/2013 0100   VLDL 24 09/23/2013 0100   LDLCALC 140* 09/23/2013 0100   Cardiac Panel (last 3 results)  Recent Labs  09/23/13 0100 09/23/13 0629 09/23/13 1244  TROPONINI 4.48* 2.25* 1.18*     Assessment/Plan   Principal Problem:   NSTEMI (non-ST elevated myocardial infarction) Active Problems:   HYPERLIPIDEMIA   Obesity, morbid   HYPERTENSION   CAD   FIBRILLATION, ATRIAL   Chronic diastolic CHF (congestive heart failure)  Plan:    No CP, SOB, Dizziness.  INR 1.67.  Left heart cath today.  Hydrate prior to cath.  122ml/hr.  EF 55-65%.  On IV heparin, ASA, lipitor, lopressor 75bid, tikosyn 500Q12.  BP and HR stable .  NSR.      LOS: 2 days    HAGER, BRYAN PA-C 09/24/2013 8:07 AM  Personally seen and examined. Agree with above.  68-year-old with paroxysmal atrial  fibrillation on chronic anticoagulation with warfarin, Tikosyn, chronic diastolic heart failure, hyperlipidemia, hypertension, obstructive sleep apnea on CPAP with coronary artery disease status post LAD PCI in 2010 here with recurrent symptomatic atrial fibrillation with concomitant troponin elevation of 4.4 consistent with non-ST elevation myocardial infarction versus demand ischemia.   -Proceeding with left heart catheterization today. INR 1.67. Prefer radial artery approach to reduce bleeding complication risk.  -She is still grieving from the loss of her husband after being married for 55 years. He died approximately 3 weeks ago.  -Discussed with her and her family.  -Post catheterization we will need to resume warfarin.   SKAINS, MARK, MD  

## 2013-09-24 NOTE — CV Procedure (Signed)
    Cardiac Catheterization Procedure Note  Name: [name] MRN: [id number] DOB: [birth year]  Procedure: Left Heart Cath, Selective Coronary Angiography, LV angiography  Indication: NSTEMI. This is a 68 year old woman with known coronary artery disease. She underwent LAD stenting in 2010. She presented with symptomatic atrial fibrillation and ruled in for non-ST elevation infarction. She was referred for cardiac catheterization. She's been maintained on long-term warfarin.   Procedural Details: The right wrist was prepped, draped, and anesthetized with 1% lidocaine. Using the modified Seldinger technique, a 5/6 French Slender sheath was introduced into the right radial artery. 3 mg of verapamil was administered through the sheath, weight-based unfractionated heparin was administered intravenously. Standard Judkins catheters were used for selective coronary angiography and left ventriculography. Catheter exchanges were performed over an exchange length guidewire. There were no immediate procedural complications. A TR band was used for radial hemostasis at the completion of the procedure.  The patient was transferred to the post catheterization recovery area for further monitoring.  Procedural Findings: Hemodynamics: AO 136/64 with a mean of 94 LV 134/28  Coronary angiography: Coronary dominance: right  Left mainstem: The left main is patent without obstructive disease.  Left anterior descending (LAD): The LAD is patent and it wraps around the left ventricular apex. The proximal LAD stented segment is widely patent with no significant in-stent restenosis. There is moderate diffuse mid LAD stenosis in the range of 50-60%. The diagonal branches are patent without significant disease. There is minimal change in the mid LAD compared to the cardiac cath study from 2010.  Left circumflex (LCx): The left circumflex is patent. The vessel is tortuous and there is no significant disease noted. The OM  and intermediate branch are patent.  Right coronary artery (RCA): Large, dominant vessel without obstructive coronary disease. There is mild nonobstructive 30-40% stenosis in the mid vessel. There is a large PDA branch.  Left ventriculography: Left ventricular systolic function is hyperdynamic. The LV apex obliterates and has the appearance of apical hypertrophic cardiomyopathy. The LVEF is estimated at 70%.  Estimated Blood Loss: minimal  Final Conclusions:   1. continued stent patency of the proximal LAD with moderate diffuse mid vessel stenosis, unchanged from 2010 2. Minimal nonobstructive disease in the left circumflex and right coronary arteries 3. Hyperdynamic LV function, possible apical hypertrophic cardiomyopathy.  Recommendations: Will resume warfarin today. Will restart heparin and a DVT prophylaxis dose. I suspect this was a demand ischemia event (type II myocardial infarction) related to atrial fibrillation and hypertrophic cardiomyopathy in the setting of moderate coronary artery disease.  Tonny Bollman MD, [facility] 09/24/2013, 3:15 PM

## 2013-09-24 NOTE — Progress Notes (Signed)
Placed pt. On cpap,. Pt. Is tolerating well at this time.

## 2013-09-24 NOTE — Progress Notes (Signed)
ANTICOAGULATION CONSULT NOTE - Initial Consult  Pharmacy Consult for warfarin Indication: atrial fibrillation  Allergies  Allergen Reactions  . Rosuvastatin Other (See Comments)    REACTION: muscle and joint pain    Patient Measurements: Height: 5\' 5"  (165.1 cm) Weight: 268 lb 14.4 oz (121.972 kg) IBW/kg (Calculated) : 57   Vital Signs: Temp: 97.7 F (36.5 C) (08/20 0601) Temp src: Oral (08/20 0601) BP: 150/71 mmHg (08/20 1600) Pulse Rate: 82 (08/20 1600)  Labs:  Recent Labs  09/22/13 1839 09/23/13 0100 09/23/13 0629 09/23/13 1244 09/24/13 0518  HGB 13.8 13.3  --   --   --   HCT 42.0 39.8  --   --   --   PLT 230 210  --   --   --   LABPROT 27.0* 25.8*  --   --  19.7*  INR 2.50* 2.36*  --   --  1.67*  CREATININE 1.00 0.95  --   --  0.93  TROPONINI 1.37* 4.48* 2.25* 1.18*  --     Estimated Creatinine Clearance: 73.8 ml/min (by C-G formula based on Cr of 0.93).   Medical History: Past Medical History  Diagnosis Date  . Hyperlipidemia   . Hypertension   . Osteoporosis   . Atrial fibrillation     a. s/p multiple cardioversions;  b. previously on Tikosyn lower dose - ineffective, admitted 08/2011 with loading at BID with DCCV at that time;  c. Rate controlled & on coumadin  . CAD (coronary artery disease) 07/2008    a. 07/2008 NSTEMI ->Cath: LM nl, LAD 70p/90p, 79m, LCX nl, RCA nl, EF 60%.  The LAD was stented with a 2.25x14mm Veri-Flex BMS  . Duodenitis   . Headache(784.0)   . Chronic diastolic CHF (congestive heart failure)     a. 07/2008 Echo: EF 60-65%, Triv AI/MR, Mild TR  . Morbid obesity   . Difficult intubation   . History of positive PPD   . Arthritis   . Apical variant hypertrophic cardiomyopathy 09/24/2013    Assessment: 80 YOF admitted with CP now s/p cath which revealed pain likely from demand ischemia related to AFib. INR this morning 1.67. Home dose of warfarin 2.5mg  daily with last dose 8/17.  Goal of Therapy:  INR 2-3 Monitor  platelets by anticoagulation protocol: Yes   Plan:  1. Warfarin 5mg  po x1 tonight 2. Daily PT/INR 3. Follow for s/s bleeding  Jackie Littlejohn D. Hurley Sobel, PharmD, BCPS Clinical Pharmacist Pager: [phone] 09/24/2013 4:05 PM

## 2013-09-24 NOTE — Progress Notes (Signed)
ANTICOAGULATION CONSULT NOTE - Initial Consult  Pharmacy Consult for heparin Indication: hx afib, nstemi  Allergies  Allergen Reactions  . Rosuvastatin Other (See Comments)    REACTION: muscle and joint pain    Patient Measurements: Height: 5\' 5"  (165.1 cm) Weight: 268 lb 14.4 oz (121.972 kg) IBW/kg (Calculated) : 57 Heparin Dosing Weight: 88kg  Vital Signs: Temp: 97.7 F (36.5 C) (08/20 0601) Temp src: Oral (08/20 0601) BP: 134/57 mmHg (08/20 0601) Pulse Rate: 58 (08/20 0601)  Labs:  Recent Labs  09/22/13 1839 09/23/13 0100 09/23/13 0629 09/23/13 1244 09/24/13 0518  HGB 13.8 13.3  --   --   --   HCT 42.0 39.8  --   --   --   PLT 230 210  --   --   --   LABPROT 27.0* 25.8*  --   --  19.7*  INR 2.50* 2.36*  --   --  1.67*  CREATININE 1.00 0.95  --   --   --   TROPONINI 1.37* 4.48* 2.25* 1.18*  --     Estimated Creatinine Clearance: 72.2 ml/min (by C-G formula based on Cr of 0.95).   Medical History: Past Medical History  Diagnosis Date  . Hyperlipidemia   . Hypertension   . Osteoporosis   . Atrial fibrillation     a. s/p multiple cardioversions;  b. previously on Tikosyn lower dose - ineffective, admitted 08/2011 with loading at BID with DCCV at that time;  c. Rate controlled & on coumadin  . CAD (coronary artery disease) 07/2008    a. 07/2008 NSTEMI ->Cath: LM nl, LAD 70p/90p, 66m, LCX nl, RCA nl, EF 60%.  The LAD was stented with a 2.25x24mm Veri-Flex BMS  . Duodenitis   . Headache(784.0)   . Chronic diastolic CHF (congestive heart failure)     a. 07/2008 Echo: EF 60-65%, Triv AI/MR, Mild TR  . Morbid obesity   . Difficult intubation   . History of positive PPD   . Arthritis     Medications:  Warfarin pta - on hold  Assessment: 68 year old female with hx afib on coumadin prior to admission, presents to [facility] with nstemi with troponins peaking at 4.48. Coumadin currently on hold for cardiology work up. INR down to 1.67 this am, orders to start  heparin.  Patient not currently on schedule for cath today.   Goal of Therapy:  INR 2-3 Heparin level 0.3-0.7 units/ml Monitor platelets by anticoagulation protocol: Yes   Plan:  Start heparin infusion at 1200 units/hr Check anti-Xa level in 8 hours and daily while on heparin Continue to monitor H&H and platelets  Sheppard Coil PharmD., BCPS Clinical Pharmacist Pager [phone] 09/24/2013 6:14 AM

## 2013-09-25 DIAGNOSIS — I428 Other cardiomyopathies: Secondary | ICD-10-CM

## 2013-09-25 DIAGNOSIS — I248 Other forms of acute ischemic heart disease: Secondary | ICD-10-CM

## 2013-09-25 LAB — CBC
HCT: 42.6 % (ref 36.0–46.0)
Hemoglobin: 13.8 g/dL (ref 12.0–15.0)
MCH: 27.9 pg (ref 26.0–34.0)
MCHC: 32.4 g/dL (ref 30.0–36.0)
MCV: 86.1 fL (ref 78.0–100.0)
PLATELETS: 221 10*3/uL (ref 150–400)
RBC: 4.95 MIL/uL (ref 3.87–5.11)
RDW: 13.6 % (ref 11.5–15.5)
WBC: 9 10*3/uL (ref 4.0–10.5)

## 2013-09-25 LAB — PROTIME-INR
INR: 1.55 — ABNORMAL HIGH (ref 0.00–1.49)
PROTHROMBIN TIME: 18.6 s — AB (ref 11.6–15.2)

## 2013-09-25 MED ORDER — ATORVASTATIN CALCIUM 40 MG PO TABS: 40.0000 mg | ORAL_TABLET | Freq: Every day | ORAL | Status: DC

## 2013-09-25 MED ORDER — NITROGLYCERIN 0.4 MG SL SUBL: 0.4000 mg | SUBLINGUAL_TABLET | SUBLINGUAL | Status: DC | PRN

## 2013-09-25 NOTE — Progress Notes (Signed)
CARDIAC REHAB PHASE I   PRE:  Rate/Rhythm: 72 SR  BP:  Supine:   Sitting: 106/46  Standing:    SaO2:   MODE:  Ambulation: 390 ft   POST:  Rate/Rhythm: 78 SR  BP:  Supine:   Sitting: 153/54  Standing:    SaO2:  0910-1008 Pt walked 390 ft with steady gait. No CP. Remained in NSR. Pt has knee problems which limit walking. Does have stationary bike at home. Pt has attended [facility] before. Discussed doing it again and pt gave permission to refer to [place] where she attended about 5 years ago. Reviewed heart healthy diet and NTG use. Pt stated she had book on atrial fib from previous admissions. Knows role of Coumadin with atrial fib. Gave modified ex ed due to knee limitations. Encouraged stationary bike in week. Completed MI ed. Pt voiced understanding of all ed. Talked about stress she has been under with passing of husband and his cancer. Showed me pictures of her puppies which are a source of joy for her.   Luetta Nutting, RN BSN  09/25/2013 10:02 AM

## 2013-09-25 NOTE — Discharge Summary (Signed)
Physician Discharge Summary     Cardiologist:  Hochrein/Klein  Patient ID: [name] MRN: [id number] DOB/AGE: 70/08/1945 70 y.o.  Admit date: 09/22/2013 Discharge date: 09/25/2013  Admission Diagnoses:  NSTEMI, Afib RVR   Discharge Diagnoses:  Principal Problem:   NSTEMI (non-ST elevated myocardial infarction) Active Problems:   HYPERLIPIDEMIA   Obesity, morbid   HYPERTENSION   CAD   FIBRILLATION, ATRIAL with RVR   Chronic diastolic CHF (congestive heart failure)   Apical variant hypertrophic cardiomyopathy   Discharged Condition:  Stable   Hospital Course:     69F with atrial fibrillation on warfarin and tikosyn, chronic diastolic CHF, HLD, HTN, OSA on CPAP, and CAD s/p LAD PCI (2010) who presents with symptomatic AF recurrence and NSTEMI.   [name] reports that at 1pm today she "went into AF." She is very familiar with her AF symptoms at states that she typically has brief episodes lasting a few minutes every month or so. She has noted more recurrences - about 3 over the past 2 weeks - which she attributes to stress surrounding her husbands death 3 weeks ago. [name] reports that today she experienced tightness in her chest and throat with associated clamminess and cold sweats. She used her home BP monitor to document a HR of 138 and BO if 168/"139". This episode persisted longer than usual and she called EMS.  On arrival, EMS gave [name] 324mg  ASA and SL NTG which improved her pain and apparently coincided with a return to sinus rhythm. The episode lasted for a total of 3 hours. The 1st ECG by EMS documented NSR. On arrival to the ER, [name] remained in NSR and had stable repol abnormalities (likely LVH related, see below). Labs were notable for TnI 1.37, INR 2.5, K 4.1.  When asked about the symptoms [name] felt prior to her MI in 2010, she reported she had "very little symptoms.Marland KitchenMarland Kitchenpretty much what I had today."   Of note, [name] has required 4 cardioversions in the  past. She failed dofetilide at and is currently having breakthrough AF on dofetilide BID. An echo on 07/05/11 revealed normal EF and a Lexiscan Myoview on 07/04/11 did not demonstrate ischemia or infarct. She has tolerated warfarin well with INRs typically in range and without bleeding episodes. She is compliant with her CPAP. She avoids alcohol and tobacco although does have sodas.   She was admitted.  Coumadin was held and IV heparin started when INR dropped below 2.0.  Tikosyn and lopressor were continued and the patient did not have any further Afib.  Left heart cath revealed continued stent patency of the proximal LAD with moderate diffuse mid vessel stenosis, unchanged from 2010.  Minimal nonobstructive disease in the left circumflex and right coronary arteries.  Hyperdynamic LV function, possible apical hypertrophic cardiomyopathy.  Her NSTEMI was likley from demand ischemia in the setting of Afib RVR.   2D echo revealed normal EF.  The patient was seen by Dr. Anne Fu who felt she was stable for DC home without bridging to coumadin.  Follow up INR on Monday.  We will continue lipitor as her LDL is poorly controlled.  EP follow up arranged.     Consults:    Significant Diagnostic Studies:  Lipid Panel     Component Value Date/Time   CHOL 205* 09/23/2013 0100   TRIG 122 09/23/2013 0100   HDL 41 09/23/2013 0100   CHOLHDL 5.0 09/23/2013 0100   VLDL 24 09/23/2013 0100  LDLCALC 140* 09/23/2013 0100     Cardiac Catheterization Procedure Note  Name: [name]  MRN: [id number]  DOB: Jul 17, 1945  Procedure: Left Heart Cath, Selective Coronary Angiography, LV angiography  Indication: NSTEMI. This is a 68 year old woman with known coronary artery disease. She underwent LAD stenting in 2010. She presented with symptomatic atrial fibrillation and ruled in for non-ST elevation infarction. She was referred for cardiac catheterization. She's been maintained on long-term warfarin.  Procedural  Details: The right wrist was prepped, draped, and anesthetized with 1% lidocaine. Using the modified Seldinger technique, a 5/6 French Slender sheath was introduced into the right radial artery. 3 mg of verapamil was administered through the sheath, weight-based unfractionated heparin was administered intravenously. Standard Judkins catheters were used for selective coronary angiography and left ventriculography. Catheter exchanges were performed over an exchange length guidewire. There were no immediate procedural complications. A TR band was used for radial hemostasis at the completion of the procedure. The patient was transferred to the post catheterization recovery area for further monitoring.  Procedural Findings:  Hemodynamics:  AO 136/64 with a mean of 94  LV 134/28  Coronary angiography:  Coronary dominance: right  Left mainstem: The left main is patent without obstructive disease.  Left anterior descending (LAD): The LAD is patent and it wraps around the left ventricular apex. The proximal LAD stented segment is widely patent with no significant in-stent restenosis. There is moderate diffuse mid LAD stenosis in the range of 50-60%. The diagonal branches are patent without significant disease. There is minimal change in the mid LAD compared to the cardiac cath study from 2010.  Left circumflex (LCx): The left circumflex is patent. The vessel is tortuous and there is no significant disease noted. The OM and intermediate branch are patent.  Right coronary artery (RCA): Large, dominant vessel without obstructive coronary disease. There is mild nonobstructive 30-40% stenosis in the mid vessel. There is a large PDA branch.  Left ventriculography: Left ventricular systolic function is hyperdynamic. The LV apex obliterates and has the appearance of apical hypertrophic cardiomyopathy. The LVEF is estimated at 70%.  Estimated Blood Loss: minimal    Final Conclusions:  1. continued stent patency of the  proximal LAD with moderate diffuse mid vessel stenosis, unchanged from 2010  2. Minimal nonobstructive disease in the left circumflex and right coronary arteries  3. Hyperdynamic LV function, possible apical hypertrophic cardiomyopathy.  Recommendations: Will resume warfarin today. Will restart heparin and a DVT prophylaxis dose. I suspect this was a demand ischemia event (type II myocardial infarction) related to atrial fibrillation and hypertrophic cardiomyopathy in the setting of moderate coronary artery disease.  Tonny Bollman MD, [facility]  2D Echo, Study Conclusions  - Left ventricle: There is obliteration of the apex in systole. The cavity size was normal. There was mild concentric hypertrophy. Systolic function was normal. The estimated ejection fraction was in the range of 60% to 65%. - Aortic valve: Trileaflet; normal thickness, mildly calcified leaflets. - Mitral valve: Calcified annulus.   Treatments:   Discharge Exam: Blood pressure 120/54, pulse 56, temperature 98.1 F (36.7 C), temperature source Oral, resp. rate 18, height 5\' 5"  (1.651 m), weight 269 lb 11.6 oz (122.345 kg), SpO2 96.00%.   Disposition: 01-Home or Self Care      Discharge Instructions   Diet - low sodium heart healthy    Complete by:  As directed      Discharge instructions    Complete by:  As directed   No lifting  with right arm for three days.     Increase activity slowly    Complete by:  As directed             Medication List         acetaminophen 500 MG tablet  Commonly known as:  TYLENOL  Take 1,000 mg by mouth every 6 (six) hours as needed for headache.     atorvastatin 40 MG tablet  Commonly known as:  LIPITOR  Take 1 tablet (40 mg total) by mouth daily at 6 PM.     dofetilide 500 MCG capsule  Commonly known as:  TIKOSYN  Take 500 mcg by mouth every 12 (twelve) hours.     furosemide 20 MG tablet  Commonly known as:  LASIX  Take 20 mg by mouth daily.     magnesium oxide 400  MG tablet  Commonly known as:  MAG-OX  Take 400 mg by mouth daily.     metoprolol 50 MG tablet  Commonly known as:  LOPRESSOR  Take 75 mg by mouth 2 (two) times daily.     nitroGLYCERIN 0.4 MG SL tablet  Commonly known as:  NITROSTAT  Place 1 tablet (0.4 mg total) under the tongue every 5 (five) minutes x 3 doses as needed for chest pain.     warfarin 5 MG tablet  Commonly known as:  COUMADIN  Take 2.5 mg by mouth daily.       Follow-up Information   Follow up with Rollene RotundaJames Hochrein, MD On 10/23/2013. (10:30 AM)    Specialty:  Cardiology   Contact information:   [address] [place] [address] [phone]       Follow up with Sherryl MangesSteven Klein, MD On 11/05/2013. (11:30 AM)    Specialty:  Cardiology   Contact information:   [address] [place] [address] [phone]       Follow up with [facility] On 09/28/2013. (3:30 PM)    Contact information:   [address] [place] [address] [phone]     Greater than 30 minutes was spent completing the patient's discharge.     SignedWilburt Finlay: Dominie Benedick, PAC 09/25/2013, 8:57 AM

## 2013-09-25 NOTE — Progress Notes (Signed)
Subjective:   Objective: Vital signs in last 24 hours: Temp:  [98.1 F (36.7 C)-98.6 F (37 C)] 98.1 F (36.7 C) (08/21 0436) Pulse Rate:  [56-64] 56 (08/21 0436) Resp:  [16-18] 18 (08/21 0436) BP: (112-157)/(54-98) 120/54 mmHg (08/21 0436) SpO2:  [96 %-99 %] 96 % (08/21 0436) Weight:  [269 lb 11.6 oz (122.345 kg)] 269 lb 11.6 oz (122.345 kg) (08/21 0436) Last BM Date: 09/24/13  Intake/Output from previous day: 08/20 0701 - 08/21 0700 In: 2238.8 [P.O.:700; I.V.:1538.8] Out: 475 [Urine:475] Intake/Output this shift:    Medications Current Facility-Administered Medications  Medication Dose Route Frequency Provider Last Rate Last Dose  . 0.9 %  sodium chloride infusion  250 mL Intravenous PRN Tonny Bollman, MD      . acetaminophen (TYLENOL) tablet 1,000 mg  1,000 mg Oral Q6H PRN Glori Luis, MD   1,000 mg at 09/23/13 0029  . acetaminophen (TYLENOL) tablet 650 mg  650 mg Oral Q4H PRN Glori Luis, MD   650 mg at 09/23/13 0233  . aspirin EC tablet 81 mg  81 mg Oral Daily Rollene Rotunda, MD   81 mg at 09/23/13 4356  . atorvastatin (LIPITOR) tablet 40 mg  40 mg Oral q1800 Glori Luis, MD   40 mg at 09/24/13 1044  . dofetilide (TIKOSYN) capsule 500 mcg  500 mcg Oral Q12H Glori Luis, MD   500 mcg at 09/24/13 1955  . furosemide (LASIX) tablet 20 mg  20 mg Oral Daily Glori Luis, MD   20 mg at 09/24/13 8616  . heparin injection 5,000 Units  5,000 Units Subcutaneous 3 times per day Tonny Bollman, MD   5,000 Units at 09/25/13 [phone]  . magnesium oxide (MAG-OX) tablet 400 mg  400 mg Oral Daily Glori Luis, MD   400 mg at 09/24/13 9021  . metoprolol tartrate (LOPRESSOR) tablet 75 mg  75 mg Oral BID Rollene Rotunda, MD   75 mg at 09/24/13 2130  . nitroGLYCERIN (NITROGLYN) 2 % ointment 0.5 inch  0.5 inch Topical 4 times per day Toy Cookey, MD   0.5 inch at 09/23/13 0125  . nitroGLYCERIN (NITROSTAT) SL tablet 0.4 mg  0.4 mg Sublingual Q5 Min x 3 PRN Glori Luis, MD      . ondansetron [facility]) injection 4 mg  4 mg Intravenous Q6H PRN Glori Luis, MD      . sodium chloride 0.9 % injection 3 mL  3 mL Intravenous Q12H Tonny Bollman, MD      . sodium chloride 0.9 % injection 3 mL  3 mL Intravenous PRN Tonny Bollman, MD      . Warfarin - Pharmacist Dosing Inpatient   Does not apply q1800 Lauren Bajbus, RPH        PE: General appearance: alert, cooperative and no distress  Lungs: clear to auscultation bilaterally  Heart: regular rate and rhythm, S1, S2 normal,1/6 sys murmur, No click, rub or gallop  Extremities: No LEE  Pulses: 2+ and symmetric  Skin: Warm and dry.  No ecchymosis or erythema at radial cath site. Neurologic: Grossly normal   Lab Results:   Recent Labs  09/22/13 1839 09/23/13 0100 09/25/13 0427  WBC 9.3 9.7 9.0  HGB 13.8 13.3 13.8  HCT 42.0 39.8 42.6  PLT 230 210 221   BMET  Recent Labs  09/22/13 1839 09/23/13 0100 09/24/13 0518  NA 138 140 138  K 4.1 4.0 4.2  CL 101 103 102  CO2 23 25 23   GLUCOSE 110*  99 100*  BUN 18 17 16   CREATININE 1.00 0.95 0.93  CALCIUM 9.4 9.4 9.4   PT/INR  Recent Labs  09/23/13 0100 09/24/13 0518 09/25/13 0427  LABPROT 25.8* 19.7* 18.6*  INR 2.36* 1.67* 1.55*   Cholesterol  Recent Labs  09/23/13 0100  CHOL 205*   Lipid Panel     Component Value Date/Time   CHOL 205* 09/23/2013 0100   TRIG 122 09/23/2013 0100   HDL 41 09/23/2013 0100   CHOLHDL 5.0 09/23/2013 0100   VLDL 24 09/23/2013 0100   LDLCALC 140* 09/23/2013 0100       Assessment/Plan  Principal Problem:   NSTEMI (non-ST elevated myocardial infarction) Active Problems:   HYPERLIPIDEMIA   Obesity, morbid   HYPERTENSION   CAD   FIBRILLATION, ATRIAL   Chronic diastolic CHF (congestive heart failure)   Apical variant hypertrophic cardiomyopathy  Plan: SP left heart cath revealing patent proximal LAD with moderate diffuse mid vessel stenosis, unchanged from 2010.  Minimal nonobstructive  disease in the left circumflex and right coronary arteries.  Hyperdynamic LV function, possible apical hypertrophic cardiomyopathy.  NSTEMI likely from demand ischemia in the setting of afib RVR.  Continues in sinus brady(50's). Coumadin restarted.  Check INR on Monday.  DC home this morning.      LOS: 3 days    HAGER, BRYAN PA-C 09/25/2013 7:41 AM  Personally seen and examined. Agree with above. Agree with discharge. Hypertrophic cardiomyopathy, apical variant, spade shape ventricle. Troponin elevation from demand ischemia.  I would like for her to have a reevaluation with electrophysiology as an outpatient given her breakthrough atrial fibrillation on dofetilide. We will set up.  Donato SchultzSKAINS, Brittanni Cariker, MD

## 2013-09-25 NOTE — Discharge Summary (Signed)
Personally seen and examined. Agree with above. Her discharge diagnosis actually is demand ischemia from atrial fibrillation with rapid ventricular response causing subendocardial ischemia likely in the apical hypertrophy region of her heart. Cardiac catheterization as above. We will continue to allow INR to increase safely. EP visit will be helpful to determine if any further action should be taken with her breakthrough atrial fibrillation. She has maintained sinus rhythm throughout the hospitalization.  Donato Schultz, MD

## 2013-09-25 NOTE — Discharge Instructions (Signed)
Radial Site Care °Refer to this sheet in the next few weeks. These instructions provide you with information on caring for yourself after your procedure. Your caregiver may also give you more specific instructions. Your treatment has been planned according to current medical practices, but problems sometimes occur. Call your caregiver if you have any problems or questions after your procedure. °HOME CARE INSTRUCTIONS °· You may shower the day after the procedure. Remove the bandage (dressing) and gently wash the site with plain soap and water. Gently pat the site dry. °· Do not apply powder or lotion to the site. °· Do not submerge the affected site in water for 3 to 5 days. °· Inspect the site at least twice daily. °· Do not flex or bend the affected arm for 24 hours. °· No lifting over 5 pounds (2.3 kg) for 5 days after your procedure. °· Do not drive home if you are discharged the same day of the procedure. Have someone else drive you. °· You may drive 24 hours after the procedure unless otherwise instructed by your caregiver. °· Do not operate machinery or power tools for 24 hours. °· A responsible adult should be with you for the first 24 hours after you arrive home. °What to expect: °· Any bruising will usually fade within 1 to 2 weeks. °· Blood that collects in the tissue (hematoma) may be painful to the touch. It should usually decrease in size and tenderness within 1 to 2 weeks. °SEEK IMMEDIATE MEDICAL CARE IF: °· You have unusual pain at the radial site. °· You have redness, warmth, swelling, or pain at the radial site. °· You have drainage (other than a small amount of blood on the dressing). °· You have chills. °· You have a fever or persistent symptoms for more than 72 hours. °· You have a fever and your symptoms suddenly get worse. °· Your arm becomes pale, cool, tingly, or numb. °· You have heavy bleeding from the site. Hold pressure on the site. °Document Released: 02/24/2010 Document Revised:  04/16/2011 Document Reviewed: 02/24/2010 °ExitCare® Patient Information ©2015 [employer]. This information is not intended to replace advice given to you by your health care provider. Make sure you discuss any questions you have with your health care provider. ° °

## 2013-09-28 ENCOUNTER — Ambulatory Visit (INDEPENDENT_AMBULATORY_CARE_PROVIDER_SITE_OTHER): Payer: Medicare Other | Admitting: *Deleted

## 2013-09-28 DIAGNOSIS — Z7901 Long term (current) use of anticoagulants: Secondary | ICD-10-CM | POA: Diagnosis not present

## 2013-09-28 DIAGNOSIS — I4891 Unspecified atrial fibrillation: Secondary | ICD-10-CM

## 2013-09-28 DIAGNOSIS — Z5181 Encounter for therapeutic drug level monitoring: Secondary | ICD-10-CM

## 2013-09-28 LAB — POCT INR: INR: 2.9

## 2013-10-19 ENCOUNTER — Ambulatory Visit (INDEPENDENT_AMBULATORY_CARE_PROVIDER_SITE_OTHER): Payer: Medicare Other | Admitting: Pharmacist

## 2013-10-19 DIAGNOSIS — I4891 Unspecified atrial fibrillation: Secondary | ICD-10-CM

## 2013-10-19 DIAGNOSIS — Z7901 Long term (current) use of anticoagulants: Secondary | ICD-10-CM

## 2013-10-19 DIAGNOSIS — Z5181 Encounter for therapeutic drug level monitoring: Secondary | ICD-10-CM | POA: Diagnosis not present

## 2013-10-19 LAB — POCT INR: INR: 3.3

## 2013-10-20 ENCOUNTER — Encounter: Payer: Self-pay | Admitting: Pulmonary Disease

## 2013-10-20 ENCOUNTER — Ambulatory Visit (INDEPENDENT_AMBULATORY_CARE_PROVIDER_SITE_OTHER): Payer: Medicare Other | Admitting: Pulmonary Disease

## 2013-10-20 VITALS — BP 140/80 | HR 81 | Temp 98.1°F | Ht 65.0 in | Wt 280.6 lb

## 2013-10-20 DIAGNOSIS — G4730 Sleep apnea, unspecified: Secondary | ICD-10-CM | POA: Diagnosis not present

## 2013-10-20 DIAGNOSIS — I2510 Atherosclerotic heart disease of native coronary artery without angina pectoris: Secondary | ICD-10-CM

## 2013-10-20 DIAGNOSIS — Z23 Encounter for immunization: Secondary | ICD-10-CM

## 2013-10-20 NOTE — Progress Notes (Signed)
   Subjective:    Patient ID: [name], female    DOB: 19-Jul-1945, 68 y.o.   MRN: [id number]  HPI 68 y.o obese woman for FU of obstructive sleep apnea .  She has a history of CAD, atrial fibrillation, chronic diastolic CHF, HTN, HL. She required a bare-metal stent to the LAD in 2010 . Echo 5/13: Small LV cavity with obliteration of the apex in systole, inferobasal HK, moderate LVH, EF 55%.  She has symptomatic atrial fibrillation and after trial of dofelitide , underwent recurrent cardioversion -last 7/13 but reverted to AF.  PSG 10/07 >> AHI 49/h corrected by CPAP 15 cm (wt 200 lbs) She has gained 75 lbs since  10/13 Events were corrected by 15 cm to absence of events and 17 cm to absence of snoring. CPAP titration was optimal with a medium full- face mask.   Home study showed moderate OSA  Download on 15 cm 01/2012 >> good control of events - cpap working well, leak ++, needs better seal on mask   09/15/2012 Download 2/14 - on 15 cm ,No residuals, good compliance   10/20/2013  Chief Complaint  Patient presents with  . Sleep Apnea    1 year follow-up. Pt is wearing cpap most nights for about 3 -4 hours a night. Pt is having difficulty sleeping due to loss of husband.     Husband died 2 months ago, was on home hospice - she did not use cpap then for fear of not hearing him. Back to using it now, but cannot sleep CPAP is effective  ESS 4/24  Bedtime 1030 p, latency 15 mins, she sleeps on an elevated bed with 1 pillow, 2-3 nocturnal awakenings for nocturia, oob at 7 am, feels refreshed, snoring +, denies dryness of mouth,headaches  Pt reports she wears her CPAP everynight x 4-8 hrs a night. Denies any problems w/ mask/machine. She just got a new mask. She is feeling rested during the day.       Review of Systems neg for any significant sore throat, dysphagia, itching, sneezing, nasal congestion or excess/ purulent secretions, fever, chills, sweats, unintended wt loss, pleuritic or  exertional cp, hempoptysis, orthopnea pnd or change in chronic leg swelling. Also denies presyncope, palpitations, heartburn, abdominal pain, nausea, vomiting, diarrhea or change in bowel or urinary habits, dysuria,hematuria, rash, arthralgias, visual complaints, headache, numbness weakness or ataxia.     Objective:   Physical Exam  Gen. Pleasant, obese, in no distress ENT - no lesions, no post nasal drip Neck: No JVD, no thyromegaly, no carotid bruits Lungs: no use of accessory muscles, no dullness to percussion, decreased without rales or rhonchi  Cardiovascular: Rhythm regular, heart sounds  normal, no murmurs or gallops, no peripheral edema Musculoskeletal: No deformities, no cyanosis or clubbing , no tremors       Assessment & Plan:

## 2013-10-20 NOTE — Assessment & Plan Note (Signed)
CPAP supplies will be renewed x 1 year Flu shot Offered her trazodone, but prefers not to 'it will get better'  Weight loss encouraged, compliance with goal of at least 4-6 hrs every night is the expectation. Advised against medications with sedative side effects Cautioned against driving when sleepy - understanding that sleepiness will vary on a day to day basis

## 2013-10-20 NOTE — Patient Instructions (Signed)
CPAP supplies will be renewed x 1 year Flu shot 

## 2013-10-22 ENCOUNTER — Telehealth (HOSPITAL_COMMUNITY): Payer: Self-pay | Admitting: *Deleted

## 2013-10-22 NOTE — Telephone Encounter (Signed)
Received signed order for cardiac rehab.  Pt called. Pt would like to check with dr. Antoine Poche on tomorrows visit about participating in cardiac rehab.   Pt requested call back

## 2013-10-23 ENCOUNTER — Encounter: Payer: Self-pay | Admitting: Cardiology

## 2013-10-23 ENCOUNTER — Other Ambulatory Visit: Payer: Self-pay | Admitting: *Deleted

## 2013-10-23 ENCOUNTER — Ambulatory Visit (INDEPENDENT_AMBULATORY_CARE_PROVIDER_SITE_OTHER): Payer: Medicare Other | Admitting: Cardiology

## 2013-10-23 VITALS — BP 160/80 | HR 58 | Ht 65.0 in | Wt 278.0 lb

## 2013-10-23 DIAGNOSIS — I480 Paroxysmal atrial fibrillation: Secondary | ICD-10-CM

## 2013-10-23 DIAGNOSIS — I2510 Atherosclerotic heart disease of native coronary artery without angina pectoris: Secondary | ICD-10-CM

## 2013-10-23 DIAGNOSIS — I4891 Unspecified atrial fibrillation: Secondary | ICD-10-CM | POA: Diagnosis not present

## 2013-10-23 DIAGNOSIS — I10 Essential (primary) hypertension: Secondary | ICD-10-CM | POA: Diagnosis not present

## 2013-10-23 MED ORDER — DOFETILIDE 500 MCG PO CAPS: 500.0000 ug | ORAL_CAPSULE | Freq: Two times a day (BID) | ORAL | Status: DC

## 2013-10-23 MED ORDER — WARFARIN SODIUM 5 MG PO TABS: 2.5000 mg | ORAL_TABLET | Freq: Every day | ORAL | Status: DC

## 2013-10-23 MED ORDER — NITROGLYCERIN 0.4 MG SL SUBL: 0.4000 mg | SUBLINGUAL_TABLET | SUBLINGUAL | Status: DC | PRN

## 2013-10-23 MED ORDER — METOPROLOL TARTRATE 50 MG PO TABS: 75.0000 mg | ORAL_TABLET | Freq: Two times a day (BID) | ORAL | Status: DC

## 2013-10-23 MED ORDER — FUROSEMIDE 20 MG PO TABS: 20.0000 mg | ORAL_TABLET | Freq: Every day | ORAL | Status: DC

## 2013-10-23 MED ORDER — ATORVASTATIN CALCIUM 40 MG PO TABS: 40.0000 mg | ORAL_TABLET | Freq: Every day | ORAL | Status: DC

## 2013-10-23 MED ORDER — MAGNESIUM OXIDE 400 MG PO TABS: 400.0000 mg | ORAL_TABLET | Freq: Every day | ORAL | Status: DC

## 2013-10-23 NOTE — Patient Instructions (Signed)
Your physician recommends that you schedule a follow-up appointment in: 6 months with Dr. hochrein  

## 2013-10-23 NOTE — Progress Notes (Signed)
HPI The patient presents for followup of her diastolic dysfunction, coronary disease and atrial fibrillation.  She was in the hospital in August with chest pain and atrial fibrillation. I have reviewed these records. She had atrial fibrillation at home but was in sinus rhythm when she got to the emergency room. She did have enzyme elevation. However, echocardiography demonstrated a hyperdynamic left ventricle with some apical hypertrophy. Stress perfusion study demonstrated nonobstructive plaque. She had a stent in her LAD which was patent. She has been under stress because her husband died in 2022/09/12. She's had a few episodes of palpitations. She's taken 3 nitroglycerin since discharge but her pain that she has goes away quickly. She's not having any severe symptoms like she had in August. She denies any chest pressure, neck discomfort. She's having no shortness of breath, PND or orthopnea. She's had no weight gain or edema.   Allergies  Allergen Reactions  . Rosuvastatin Other (See Comments)    REACTION: muscle and joint pain    Current Outpatient Prescriptions  Medication Sig Dispense Refill  . acetaminophen (TYLENOL) 500 MG tablet Take 1,000 mg by mouth every 6 (six) hours as needed for headache.      [name] atorvastatin (LIPITOR) 40 MG tablet Take 1 tablet (40 mg total) by mouth daily at 6 PM.  30 tablet  5  . dofetilide (TIKOSYN) 500 MCG capsule Take 500 mcg by mouth every 12 (twelve) hours.      . furosemide (LASIX) 20 MG tablet Take 20 mg by mouth daily.      . magnesium oxide (MAG-OX) 400 MG tablet Take 400 mg by mouth daily.      . metoprolol (LOPRESSOR) 50 MG tablet Take 75 mg by mouth 2 (two) times daily.      . nitroGLYCERIN (NITROSTAT) 0.4 MG SL tablet Place 1 tablet (0.4 mg total) under the tongue every 5 (five) minutes x 3 doses as needed for chest pain.  25 tablet  12  . warfarin (COUMADIN) 5 MG tablet Take 2.5 mg by mouth daily.        No current facility-administered medications  for this visit.    Past Medical History  Diagnosis Date  . Hyperlipidemia   . Hypertension   . Osteoporosis   . Atrial fibrillation     a. s/p multiple cardioversions;  b. previously on Tikosyn lower dose - ineffective, admitted 09/12/11 with loading at BID with DCCV at that time;  c. Rate controlled & on coumadin  . CAD (coronary artery disease) 07/2008    a. 07/2008 NSTEMI ->Cath: LM nl, LAD 70p/90p, 67m, LCX nl, RCA nl, EF 60%.  The LAD was stented with a 2.25x29mm Veri-Flex BMS  . Duodenitis   . Headache(784.0)   . Chronic diastolic CHF (congestive heart failure)     a. 07/2008 Echo: EF 60-65%, Triv AI/MR, Mild TR  . Morbid obesity   . Difficult intubation   . History of positive PPD   . Arthritis   . Apical variant hypertrophic cardiomyopathy 09/24/2013    Past Surgical History  Procedure Laterality Date  . Cholecystectomy    . Abdominal hysterectomy      partial, bleeding  . Knee arthroscopy  2000 & 2001    left and right  . Esophagogastroduodenoscopy  2003  . Coronary stent placement    . Tubal ligation    . Total knee arthroplasty      bilateral   ROS:   As stated in the  HPI and negative for all other systems.   PHYSICAL EXAM BP 160/80  Pulse 58  Ht  (1.651 m)  Wt 278 lb (126.1 kg)  BMI 46.26 kg/m2 GENERAL:  Well appearing HEENT:  Pupils equal round and reactive, fundi not visualized, oral mucosa unremarkable NECK:  No jugular venous distention, waveform within normal limits, carotid upstroke brisk and symmetric, no bruits, no thyromegaly LUNGS:  Clear to auscultation bilaterally CHEST:  Unremarkable HEART:  PMI not displaced or sustained,S1 and S2 within normal limits, no S3, no clicks, no rubs, 2/6 apical and right upper sternal border systolic murmur, no diastolic murmurs. ABD:  Flat, positive bowel sounds normal in frequency in pitch, no bruits, no rebound, no guarding, no midline pulsatile mass, no hepatomegaly, no splenomegaly, obese EXT:  2  plus pulses throughout, no edema, no cyanosis no clubbing  EKG:  Sinus rhythm, rate 58, axis within normal limits, QTC is stable , anterolateral T-wave inversions unchanged from previous.  10/23/2013  ASSESSMENT AND PLAN  Atrial Fibrillation  She will remain on the Tikosyn and anticoagulation. No change in therapy is planned.  CAD  She has nonobstructive disease as described on the recent past. She will continue with risk reduction.  Chronic Diastolic CHF  She seems to be euvolemic.  At this point, no change in therapy is indicated.   Hypertension  Her her blood pressure is slightly elevated today but otherwise has been well controlled.. She will continue the meds as listed..  Obesity The patient understands the need to lose weight with diet and exercise. We have discussed specific strategies for this.  Unfortunately she is unable to exercise because of joint pains.

## 2013-10-29 ENCOUNTER — Telehealth (HOSPITAL_COMMUNITY): Payer: Self-pay | Admitting: *Deleted

## 2013-10-29 NOTE — Telephone Encounter (Signed)
Message left regarding wanting to do cardiac rehab again.   Contact information provided for call back.

## 2013-11-04 DIAGNOSIS — H26499 Other secondary cataract, unspecified eye: Secondary | ICD-10-CM | POA: Diagnosis not present

## 2013-11-04 DIAGNOSIS — Z961 Presence of intraocular lens: Secondary | ICD-10-CM | POA: Diagnosis not present

## 2013-11-04 DIAGNOSIS — H40019 Open angle with borderline findings, low risk, unspecified eye: Secondary | ICD-10-CM | POA: Diagnosis not present

## 2013-11-05 ENCOUNTER — Ambulatory Visit: Payer: Medicare Other | Admitting: Internal Medicine

## 2013-11-05 ENCOUNTER — Ambulatory Visit (INDEPENDENT_AMBULATORY_CARE_PROVIDER_SITE_OTHER): Payer: Medicare Other | Admitting: *Deleted

## 2013-11-05 DIAGNOSIS — Z7901 Long term (current) use of anticoagulants: Secondary | ICD-10-CM | POA: Diagnosis not present

## 2013-11-05 DIAGNOSIS — Z5181 Encounter for therapeutic drug level monitoring: Secondary | ICD-10-CM

## 2013-11-05 DIAGNOSIS — I4891 Unspecified atrial fibrillation: Secondary | ICD-10-CM

## 2013-11-05 LAB — POCT INR: INR: 2.2

## 2013-11-12 ENCOUNTER — Encounter (HOSPITAL_COMMUNITY)
Admission: RE | Admit: 2013-11-12 | Discharge: 2013-11-12 | Disposition: A | Discharge status: Home / Self Care | Payer: Medicare Other | Source: Ambulatory Visit | Attending: Cardiology | Admitting: Cardiology

## 2013-11-12 DIAGNOSIS — E785 Hyperlipidemia, unspecified: Secondary | ICD-10-CM | POA: Insufficient documentation

## 2013-11-12 DIAGNOSIS — I422 Other hypertrophic cardiomyopathy: Secondary | ICD-10-CM | POA: Insufficient documentation

## 2013-11-12 DIAGNOSIS — Z96659 Presence of unspecified artificial knee joint: Secondary | ICD-10-CM | POA: Insufficient documentation

## 2013-11-12 DIAGNOSIS — Z79899 Other long term (current) drug therapy: Secondary | ICD-10-CM | POA: Insufficient documentation

## 2013-11-12 DIAGNOSIS — G4733 Obstructive sleep apnea (adult) (pediatric): Secondary | ICD-10-CM | POA: Insufficient documentation

## 2013-11-12 DIAGNOSIS — I4891 Unspecified atrial fibrillation: Secondary | ICD-10-CM | POA: Insufficient documentation

## 2013-11-12 DIAGNOSIS — I10 Essential (primary) hypertension: Secondary | ICD-10-CM | POA: Insufficient documentation

## 2013-11-12 DIAGNOSIS — I5032 Chronic diastolic (congestive) heart failure: Secondary | ICD-10-CM | POA: Insufficient documentation

## 2013-11-12 DIAGNOSIS — Z9861 Coronary angioplasty status: Secondary | ICD-10-CM | POA: Insufficient documentation

## 2013-11-12 DIAGNOSIS — Z5189 Encounter for other specified aftercare: Secondary | ICD-10-CM | POA: Insufficient documentation

## 2013-11-12 DIAGNOSIS — Z6841 Body Mass Index (BMI) 40.0 and over, adult: Secondary | ICD-10-CM | POA: Insufficient documentation

## 2013-11-12 DIAGNOSIS — Z7901 Long term (current) use of anticoagulants: Secondary | ICD-10-CM | POA: Insufficient documentation

## 2013-11-12 DIAGNOSIS — M810 Age-related osteoporosis without current pathological fracture: Secondary | ICD-10-CM | POA: Insufficient documentation

## 2013-11-12 DIAGNOSIS — I252 Old myocardial infarction: Secondary | ICD-10-CM | POA: Insufficient documentation

## 2013-11-12 NOTE — Progress Notes (Signed)
Cardiac Rehab Medication Review by a Pharmacist  Does the patient  feel that his/her medications are working for him/her?  yes  Has the patient been experiencing any side effects to the medications prescribed?  no  Does the patient measure his/her own blood pressure or blood glucose at home?  yes - Patient checks her blood pressure when she feels that she is in Afib. Her BP runs 168/138, HR ~ 138.  Does the patient have any problems obtaining medications due to transportation or finances?   no  Understanding of regimen: excellent Understanding of indications: excellent Potential of compliance: excellent    Pharmacist comments: Patient reports a good understanding of her medications.  She states that she feels she is in Afib about 3 times per week. During these episodes, her blood pressure runs around 168/38, HR 138.  Continue to monitor her blood pressure and HR and adjust therapy as needed.    Zi Sek, Suzan Slick 11/12/2013 8:26 AM

## 2013-11-16 ENCOUNTER — Encounter (HOSPITAL_COMMUNITY)
Admission: RE | Admit: 2013-11-16 | Discharge: 2013-11-16 | Disposition: A | Discharge status: Home / Self Care | Payer: Medicare Other | Source: Ambulatory Visit | Attending: Cardiology | Admitting: Cardiology

## 2013-11-16 DIAGNOSIS — G4733 Obstructive sleep apnea (adult) (pediatric): Secondary | ICD-10-CM | POA: Diagnosis not present

## 2013-11-16 DIAGNOSIS — Z7901 Long term (current) use of anticoagulants: Secondary | ICD-10-CM | POA: Diagnosis not present

## 2013-11-16 DIAGNOSIS — E785 Hyperlipidemia, unspecified: Secondary | ICD-10-CM | POA: Diagnosis not present

## 2013-11-16 DIAGNOSIS — I4891 Unspecified atrial fibrillation: Secondary | ICD-10-CM | POA: Diagnosis not present

## 2013-11-16 DIAGNOSIS — Z5189 Encounter for other specified aftercare: Secondary | ICD-10-CM | POA: Diagnosis not present

## 2013-11-16 DIAGNOSIS — I10 Essential (primary) hypertension: Secondary | ICD-10-CM | POA: Diagnosis not present

## 2013-11-16 DIAGNOSIS — Z9861 Coronary angioplasty status: Secondary | ICD-10-CM | POA: Diagnosis not present

## 2013-11-16 DIAGNOSIS — I252 Old myocardial infarction: Secondary | ICD-10-CM | POA: Diagnosis not present

## 2013-11-16 DIAGNOSIS — Z96659 Presence of unspecified artificial knee joint: Secondary | ICD-10-CM | POA: Diagnosis not present

## 2013-11-16 DIAGNOSIS — M810 Age-related osteoporosis without current pathological fracture: Secondary | ICD-10-CM | POA: Diagnosis not present

## 2013-11-16 DIAGNOSIS — Z79899 Other long term (current) drug therapy: Secondary | ICD-10-CM | POA: Diagnosis not present

## 2013-11-16 DIAGNOSIS — I422 Other hypertrophic cardiomyopathy: Secondary | ICD-10-CM | POA: Diagnosis not present

## 2013-11-16 DIAGNOSIS — Z6841 Body Mass Index (BMI) 40.0 and over, adult: Secondary | ICD-10-CM | POA: Diagnosis not present

## 2013-11-16 DIAGNOSIS — I5032 Chronic diastolic (congestive) heart failure: Secondary | ICD-10-CM | POA: Diagnosis not present

## 2013-11-16 NOTE — Progress Notes (Signed)
Pt started cardiac rehab today.  Pt tolerated light exercise without difficulty. Telemetry rhythm Sinus. [name] was encouraged to take rest breaks as needed due to deconditioning. [name]'s husband passed away in 08/31/22. Will continue to monitor the patient throughout  the program.

## 2013-11-18 ENCOUNTER — Encounter (HOSPITAL_COMMUNITY)
Admission: RE | Admit: 2013-11-18 | Discharge: 2013-11-18 | Disposition: A | Discharge status: Home / Self Care | Payer: Medicare Other | Source: Ambulatory Visit | Attending: Cardiology | Admitting: Cardiology

## 2013-11-18 DIAGNOSIS — Z5189 Encounter for other specified aftercare: Secondary | ICD-10-CM | POA: Diagnosis not present

## 2013-11-19 ENCOUNTER — Other Ambulatory Visit: Payer: Self-pay

## 2013-11-20 ENCOUNTER — Encounter (HOSPITAL_COMMUNITY)
Admission: RE | Admit: 2013-11-20 | Discharge: 2013-11-20 | Disposition: A | Discharge status: Home / Self Care | Payer: Medicare Other | Source: Ambulatory Visit | Attending: Cardiology | Admitting: Cardiology

## 2013-11-20 DIAGNOSIS — Z5189 Encounter for other specified aftercare: Secondary | ICD-10-CM | POA: Diagnosis not present

## 2013-11-23 ENCOUNTER — Ambulatory Visit (HOSPITAL_COMMUNITY)
Admission: RE | Admit: 2013-11-23 | Discharge: 2013-11-23 | Disposition: A | Discharge status: Home / Self Care | Payer: Medicare Other | Source: Ambulatory Visit | Attending: Family Medicine | Admitting: Family Medicine

## 2013-11-23 ENCOUNTER — Encounter: Payer: Self-pay | Admitting: Physician Assistant

## 2013-11-23 ENCOUNTER — Ambulatory Visit (INDEPENDENT_AMBULATORY_CARE_PROVIDER_SITE_OTHER): Payer: Medicare Other | Admitting: Physician Assistant

## 2013-11-23 ENCOUNTER — Encounter (HOSPITAL_COMMUNITY)
Admission: RE | Admit: 2013-11-23 | Discharge: 2013-11-23 | Disposition: A | Discharge status: Home / Self Care | Payer: Medicare Other | Source: Ambulatory Visit | Attending: Cardiology | Admitting: Cardiology

## 2013-11-23 ENCOUNTER — Telehealth: Payer: Self-pay | Admitting: Physician Assistant

## 2013-11-23 VITALS — BP 124/62 | HR 78 | Ht 64.5 in | Wt 278.0 lb

## 2013-11-23 DIAGNOSIS — I480 Paroxysmal atrial fibrillation: Secondary | ICD-10-CM

## 2013-11-23 DIAGNOSIS — I422 Other hypertrophic cardiomyopathy: Secondary | ICD-10-CM

## 2013-11-23 DIAGNOSIS — I2510 Atherosclerotic heart disease of native coronary artery without angina pectoris: Secondary | ICD-10-CM

## 2013-11-23 DIAGNOSIS — I10 Essential (primary) hypertension: Secondary | ICD-10-CM | POA: Insufficient documentation

## 2013-11-23 DIAGNOSIS — Z6841 Body Mass Index (BMI) 40.0 and over, adult: Secondary | ICD-10-CM | POA: Insufficient documentation

## 2013-11-23 DIAGNOSIS — I5032 Chronic diastolic (congestive) heart failure: Secondary | ICD-10-CM | POA: Diagnosis not present

## 2013-11-23 DIAGNOSIS — Z7901 Long term (current) use of anticoagulants: Secondary | ICD-10-CM | POA: Insufficient documentation

## 2013-11-23 DIAGNOSIS — Z951 Presence of aortocoronary bypass graft: Secondary | ICD-10-CM | POA: Insufficient documentation

## 2013-11-23 DIAGNOSIS — E785 Hyperlipidemia, unspecified: Secondary | ICD-10-CM | POA: Diagnosis not present

## 2013-11-23 DIAGNOSIS — R0602 Shortness of breath: Secondary | ICD-10-CM | POA: Diagnosis present

## 2013-11-23 DIAGNOSIS — I252 Old myocardial infarction: Secondary | ICD-10-CM | POA: Insufficient documentation

## 2013-11-23 DIAGNOSIS — R600 Localized edema: Secondary | ICD-10-CM | POA: Diagnosis present

## 2013-11-23 MED ORDER — METOPROLOL TARTRATE 100 MG PO TABS: 100.0000 mg | ORAL_TABLET | Freq: Two times a day (BID) | ORAL | Status: DC

## 2013-11-23 NOTE — Telephone Encounter (Signed)
EKG reviewed, patient is in a-fib, rate controlled, however QTc prolonged. Discussed with Dr. Antoine Poche, EKG reviewed, QTc calculated 604. Patient will need to be seen in clinic today. Appt made at 3 pm today with Ronie Spies. Tikosyn likely need to be decreased.  Ramond Dial PA Pager: [phone]

## 2013-11-23 NOTE — Patient Instructions (Addendum)
Please increase Metoprolol to 100 mg twice a day. Continue all other medications as listed.  Please have blood work today. (BMP and Mg level)  Follow up in 2 weeks with the [facility]

## 2013-11-23 NOTE — Telephone Encounter (Signed)
Contacted by Byrd Hesselbach of cardiac rehab as Mrs Rickel appear to be in a-fib. HR 90s at rest. On tikosyn, metoprolol and coumadin. Will get EKG, if in a-fib, follow up in clinic in 2 weeks.  Ramond Dial PA Pager: [phone]

## 2013-11-23 NOTE — Progress Notes (Signed)
Reviewed home exercise with pt today.  Pt plans to walk at home for exercise.  Reviewed THR, pulse, RPE, sign and symptoms, NTG use, and when to call 911 or MD.  Pt voiced understanding. Edrik Rundle, MA, ACSM RCEP   

## 2013-11-23 NOTE — Progress Notes (Signed)
[address] [place], [place]  [address] Phone: [phone] Fax:  [phone]  Date:  11/23/2013   Patient ID:  [name], [name] 1945-02-27, MRN [id number]   PCP:  Ruthe Mannan, MD  Cardiologist:  Dr. Antoine Poche  History of Present Illness: [name] is a 68 y.o. female with history of CAD s/p PCI 2010, PAF, chronic diastolic CHF, HTN, HL, and apical hypertrophy. Her history with Tikosyn is somewhat complicated. She was placed on Tikosyn in 07/2008 at with cardioversion but had a NSTEMI 2 weeks later with further QT prolongation, thus Tikosyn was downtitrated to . She was treated with a bare-metal stent to the LAD at that time. At OV in 01/2011 she was back in AF but did not feel any different than when she did in NSR thus Tikosyn was stopped and rate control strategy was pursued. She was seen for progressive dyspnea with possible a/c diastolic CHF in 06/2011 - she was back in AF. Nuc at that time showed no ischemia, EF 75%. The decision was made to re-challenge her with Tikosyn to see if this helped her dyspnea. She was admitted 08/2011 for this and underwent DCCV during that admission. She has been on BID since that time. She has continued to have intermittent palpitations but in general has done better on Tikosyn. She did require admission 09/2013 for AF RVR and elevated troponin. Cath showed continued stent patency of the proximal LAD with moderate diffuse mid vessel stenosis unchanged from 2010, minimal nonobstructive disease in LCx/RCA, and hyperdynamic LV function, possible apical hypertrophic cardiomyopathy. Demand ischemia was suspected and Tikosyn was continued. 2D echo 09/2013: obliteration of the apex in systole, cavity size was normal, mild concentric hypertrophy, EF 60% to 65%, mildly calcified aortic valve and calcified mitral annulus.   Today one of our PAs was contacted by the cardiac rehab nurse as [name] appeared to be in AF, HR 90s at rest. She had  been experiencing some SOB with exertion which is fairly typical for her. BP and O2 were stable. EKG showed atrial fib with a hand calculated QTc of 563, although QT interval was very unclear in several leads. She was asked to f/u in the office today for further evaluation and to obtain a more accurate rhythm strip for QT assessment. She says she actually feels better today than most days. EKG in the office shows continued atrial fibrillation, rate controlled. QTc calculated from rhythm strip was averaged at . She has occasional PVCs. She actually does not currently have any symptoms. She She does report intermittent breakthrough palpitations when rates are up, which she says have gotten to max 130. When this happens she gets some chest tightness similar to 09/2013, unchanged from that episode. She has not had any exertional chest pain. She has chronic DOE but no change recently   Recent Labs: 02/07/2013: Pro B Natriuretic peptide (BNP) 2359.0*  09/23/2013: HDL Cholesterol by NMR 41; LDL (calc) 140*; TSH 3.270  09/24/2013: Creatinine 0.93; Potassium 4.2  09/25/2013: Hemoglobin 13.8   Wt Readings from Last 3 Encounters:  11/23/13 278 lb (126.1 kg)  11/12/13 278 lb 3.5 oz (126.2 kg)  10/23/13 278 lb (126.1 kg)     Past Medical History  Diagnosis Date  . Hyperlipidemia   . Hypertension   . Osteoporosis   . PAF (paroxysmal atrial fibrillation)     a. s/p multiple cardioversions;  b. 2010: on Tikosyn - dose decreased from to  217mcg in setting of NSTEMI/QT prolongation. Did not hold NSR. c. admitted 08/2011 with loading at 500mcg BID with DCCV at that time;  d. Rate controlled & on coumadin.  [name]. CAD (coronary artery disease) 07/2008    a. 07/2008 NSTEMI ->Cath: LM nl, LAD 70p/90p, 6017m, LCX nl, RCA nl, EF 60%.  The LAD was stented with a 2.25x3820mm Veri-Flex BMS.  b. 8/15 cath patent stent, nonobstructive disease otherwise - unchanged. Elevated troponin felt due to demand ischemia in setting of  AF-RVR.  . Duodenitis   . Headache(784.0)   . Chronic diastolic CHF (congestive heart failure)     a. 07/2008 Echo: EF 60-65%, Triv AI/MR, Mild TR  . Morbid obesity   . Difficult intubation   . History of positive PPD   . Arthritis   . Apical variant hypertrophic cardiomyopathy 09/24/2013    Current Outpatient Prescriptions  Medication Sig Dispense Refill  . acetaminophen (TYLENOL) 500 MG tablet Take 1,000 mg by mouth every 6 (six) hours as needed for headache.      [name]. atorvastatin (LIPITOR) 40 MG tablet Take 1 tablet (40 mg total) by mouth daily at 6 PM.  90 tablet  3  . dofetilide (TIKOSYN) 500 MCG capsule Take 1 capsule (500 mcg total) by mouth every 12 (twelve) hours.  180 capsule  6  . furosemide (LASIX) 20 MG tablet Take 1 tablet (20 mg total) by mouth daily.  90 tablet  3  . magnesium oxide (MAG-OX) 400 MG tablet Take 1 tablet (400 mg total) by mouth daily.  90 tablet  3  . metoprolol (LOPRESSOR) 50 MG tablet Take 1.5 tablets (75 mg total) by mouth 2 (two) times daily.  90 tablet  3  . nitroGLYCERIN (NITROSTAT) 0.4 MG SL tablet Place 1 tablet (0.4 mg total) under the tongue every 5 (five) minutes x 3 doses as needed for chest pain.  25 tablet  12  . warfarin (COUMADIN) 5 MG tablet Take 0.5 tablets (2.5 mg total) by mouth daily.  90 tablet  3   No current facility-administered medications for this visit.    Allergies:   Rosuvastatin   Social History:  The patient  reports that she has never smoked. She has never used smokeless tobacco. She reports that she does not drink alcohol or use illicit drugs.   Family History:  The patient's family history includes Breast cancer in her paternal grandmother; Coronary artery disease in her brother; Other in her mother; Pneumonia in her father; Rectal cancer in her paternal grandfather.   ROS:  Please see the history of present illness.    All other systems reviewed and negative.   PHYSICAL EXAM:  VS:  BP 124/62  Pulse 78  Ht 5' 4.5"  (1.638 m)  Wt 278 lb (126.1 kg)  BMI 47.00 kg/m2  SpO2 96% Well nourished, well developed, obese WF in no acute distress HEENT: normal Neck: no JVD Cardiac:  normal S1, S2;irregularly irregular; no murmur Lungs:  clear to auscultation bilaterally, no wheezing, rhonchi or rales Abd: soft, nontender, no hepatomegaly Ext: no edema Skin: warm and dry Neuro:  moves all extremities spontaneously, no focal abnormalities noted  EKG:  atrial fibrillation 79bpm nonspecific inferolateral ST-T changes (these unchanged from prior), computer QTc 421 QTC difficult to assess from 12-lead - printed rhythm strip and was able to hand calculate average QTc at 438ms  ASSESSMENT AND PLAN:  1. Paroxysmal atrial fibrillation 2. CAD as above 3. Chronic diastolic CHF 4. Morbid obesity Body  mass index is 47 kg/(m^2). 5. Apical variant hypertrophic cardiomyopathy  QTc is acceptable in clinic today. Given her occasional PVCs I will check Mg and BMET to make sure they are within normal limits. She does report intermittent higher heart rates thus I will increase her beta blocker from 75mg  BID to 100mg  BID. She reports compliance with CPAP. She does not appear volume overloaded. She denies any new symptoms compared to prior. Will continue Tikosyn for now as she has historically done better on this medicine. It hasn't completely quieted her AF, but her episodes are shorter per her report. I am hopeful she will again convert on her own as she normally does. I think she would benefit from referral to Dr. Jenel Lucks atrial fibrillation clinic to obtain his opinion given her continued paroxysms of AF on her current regimen.  Dispo: Will request that she f/u with Dr. Johney Frame in AF clinic in 2 weeks.  Signed, Ronie Spies, PA-C  11/23/2013 4:18 PM

## 2013-11-23 NOTE — Progress Notes (Addendum)
Patient appears to be in Atrial fib rate 94.  Blood pressure 110/82. Oxygen saturation 97% on room air. Patient reports experiencing some shortness of breath with exertion. Upon assessment lung fields clear upon ascultation. Positive bilateral lower extremity edema and ankle edema. Hal Meng PA called and notified who ordered a 12 lead ECG. Hal said as long as [name]'s resting heart rate is rest than 100 she may exercsie. No exercise today. I reviewed [name]'s quality of life questionnaire with her. [name] denies being depressed. [name] has her children, grand chlildren and two dogs for support and company since her husband passed away. [name] has an appointment to see [facility] today at 3:00pm after Gillie Manners [facility] reviewed today's 12 lead ECG. Patient given appointment and knows to report to [facility].

## 2013-11-24 ENCOUNTER — Telehealth: Payer: Self-pay | Admitting: *Deleted

## 2013-11-24 DIAGNOSIS — I5032 Chronic diastolic (congestive) heart failure: Secondary | ICD-10-CM

## 2013-11-24 LAB — BASIC METABOLIC PANEL
BUN: 17 mg/dL (ref 6–23)
CALCIUM: 9.7 mg/dL (ref 8.4–10.5)
CO2: 29 meq/L (ref 19–32)
Chloride: 103 mEq/L (ref 96–112)
Creatinine, Ser: 1.3 mg/dL — ABNORMAL HIGH (ref 0.4–1.2)
GFR: 43.76 mL/min — AB (ref >60.00)
GLUCOSE: 110 mg/dL — AB (ref 70–99)
Potassium: 3.8 mEq/L (ref 3.5–5.1)
SODIUM: 139 meq/L (ref 135–145)

## 2013-11-24 LAB — MAGNESIUM: Magnesium: 1.9 mg/dL (ref 1.5–2.5)

## 2013-11-24 MED ORDER — POTASSIUM CHLORIDE CRYS ER 20 MEQ PO TBCR: 20.0000 meq | EXTENDED_RELEASE_TABLET | Freq: Every day | ORAL | Status: DC

## 2013-11-24 NOTE — Telephone Encounter (Signed)
pt notfied about lab results and to start K+ 20 meq daily; bmet 10/23

## 2013-11-25 ENCOUNTER — Encounter (HOSPITAL_COMMUNITY)
Admission: RE | Admit: 2013-11-25 | Discharge: 2013-11-25 | Disposition: A | Discharge status: Home / Self Care | Payer: Medicare Other | Source: Ambulatory Visit | Attending: Cardiology | Admitting: Cardiology

## 2013-11-25 DIAGNOSIS — Z5189 Encounter for other specified aftercare: Secondary | ICD-10-CM | POA: Diagnosis not present

## 2013-11-25 NOTE — Progress Notes (Signed)
Natacha returned to exercise per Dr Antoine Poche today and remained in Atrial Fib. Jacia told me her metoprolol was increased to 100 mg twice a day. Rama complained of feeling tired on the walking track. Blood pressure's remained stable today. Oxygen saturation on 95 %- 96% on room air.  Max heart rate 113 on the walking track.  We will switch Brandilynn to the arm ergometer on Friday and hope that will be less strenuous. Will continue to monitor the patient throughout  the program.

## 2013-11-26 ENCOUNTER — Telehealth (HOSPITAL_COMMUNITY): Payer: Self-pay | Admitting: *Deleted

## 2013-11-26 NOTE — Telephone Encounter (Signed)
Message copied by Cammy Copa on Thu Nov 26, 2013  8:37 AM ------      Message from: Rollene Rotunda      Created: Tue Nov 24, 2013  9:48 AM      Regarding: RE: return to [facility]       Yes thank you.      ----- Message -----         From: Cammy Copa, RN         Sent: 11/24/2013   8:48 AM           To: Rollene Rotunda, MD      Subject: return to [facility]                                  Good morning Dr Antoine Poche,            Are you okay with Mrs Cortina resuming exercise at cardiac rehab tomorrow if she is still in Atrial Fib and her rate is controlled?                  Thanks for your assistance,            Byrd Hesselbach       ------

## 2013-11-27 ENCOUNTER — Encounter (HOSPITAL_COMMUNITY)
Admission: RE | Admit: 2013-11-27 | Discharge: 2013-11-27 | Disposition: A | Discharge status: Home / Self Care | Payer: Medicare Other | Source: Ambulatory Visit | Attending: Cardiology | Admitting: Cardiology

## 2013-11-27 ENCOUNTER — Other Ambulatory Visit (INDEPENDENT_AMBULATORY_CARE_PROVIDER_SITE_OTHER): Payer: Medicare Other | Admitting: *Deleted

## 2013-11-27 DIAGNOSIS — I5032 Chronic diastolic (congestive) heart failure: Secondary | ICD-10-CM

## 2013-11-27 DIAGNOSIS — Z5189 Encounter for other specified aftercare: Secondary | ICD-10-CM | POA: Diagnosis not present

## 2013-11-27 LAB — BASIC METABOLIC PANEL
BUN: 22 mg/dL (ref 6–23)
CHLORIDE: 105 meq/L (ref 96–112)
CO2: 20 mEq/L (ref 19–32)
CREATININE: 1.2 mg/dL (ref 0.4–1.2)
Calcium: 9.5 mg/dL (ref 8.4–10.5)
GFR: 46.7 mL/min — ABNORMAL LOW (ref >60.00)
GLUCOSE: 89 mg/dL (ref 70–99)
POTASSIUM: 4.2 meq/L (ref 3.5–5.1)
Sodium: 140 mEq/L (ref 135–145)

## 2013-11-27 NOTE — Progress Notes (Signed)
[name] is in Sinus rhythm today heart rate 67.  Oxygen saturation 97% on room air. Vital signs stable. Will continue to monitor.

## 2013-11-27 NOTE — Progress Notes (Signed)
[name] 68 y.o. female Nutrition Note Spoke with pt. Nutrition Plan and Nutrition Survey goals reviewed with pt. Pt is following Step 1 of the Therapeutic Lifestyle Changes diet. Ways to improve eating habits discussed. Pt with CHF. Pt states she does not add salt to her food and avoids canned/convenience foods "but I know I get sodium from some of the foods I eat." Pt eats a [place] and cheese sub from [employer] with pickles, olives, and tomato 3 times/week. Pt aware of high sodium content of pickles and olives. Pt is on Coumadin and is aware of the need to follow a diet consistent in vitamin K intake. Pt states the Coumadin Clinic has to ask her to eat some greens, which pt does not care to eat. Barrier to making dietary changes include recent passing of pt's husband. Pt states she wants to lose wt. Pt has not been actively trying to lose. Pt is in the pre-contemplative state of change. Pt expressed understanding of the information reviewed. Pt aware of nutrition education classes offered and is unable to attend nutrition classes.  Nutrition Diagnosis   Food-and nutrition-related knowledge deficit related to lack of exposure to information as related to diagnosis of: ? CVD   Obesity related to excessive energy intake as evidenced by a BMI of 33.5  Nutrition RX/ Estimated Daily Nutrition Needs for: wt loss  1500-2000 Kcal, 40-55 gm fat, 9-15 gm sat fat, 1.4-2.0 gm trans-fat, <1500 mg sodium  Nutrition Intervention   Pt's individual nutrition plan reviewed with pt.   Benefits of adopting Therapeutic Lifestyle Changes discussed when Medficts reviewed.   Pt to attend the Portion Distortion class   Pt given handouts for: ? Nutrition I class ? Nutrition II class    Continue client-centered nutrition education by RD, as part of interdisciplinary care. Goal(s)   Pt to identify and limit food sources of saturated fat, trans fat, and cholesterol   Pt to identify food quantities necessary to achieve: ?  wt loss to a goal wt of 183-201 lb (83.3-91.5 kg) at graduation from cardiac rehab.    Pt to describe the benefit of including fruits, vegetables, whole grains, and low-fat dairy products in a heart healthy meal plan. Monitor and Evaluate progress toward nutrition goal with team. Nutrition Risk: Change to Moderate Mickle Plumb, M.Ed, RD, LDN, CDE 11/27/2013 10:37 AM

## 2013-11-30 ENCOUNTER — Telehealth: Payer: Self-pay | Admitting: *Deleted

## 2013-11-30 ENCOUNTER — Encounter (HOSPITAL_COMMUNITY)
Admission: RE | Admit: 2013-11-30 | Discharge: 2013-11-30 | Disposition: A | Discharge status: Home / Self Care | Payer: Medicare Other | Source: Ambulatory Visit | Attending: Cardiology | Admitting: Cardiology

## 2013-11-30 DIAGNOSIS — Z5189 Encounter for other specified aftercare: Secondary | ICD-10-CM | POA: Diagnosis not present

## 2013-11-30 NOTE — Telephone Encounter (Signed)
pt notified about lab results with verbal understanding  

## 2013-12-02 ENCOUNTER — Encounter (HOSPITAL_COMMUNITY)
Admission: RE | Admit: 2013-12-02 | Discharge: 2013-12-02 | Disposition: A | Discharge status: Home / Self Care | Payer: Medicare Other | Source: Ambulatory Visit | Attending: Cardiology | Admitting: Cardiology

## 2013-12-02 DIAGNOSIS — Z5189 Encounter for other specified aftercare: Secondary | ICD-10-CM | POA: Diagnosis not present

## 2013-12-04 ENCOUNTER — Ambulatory Visit (INDEPENDENT_AMBULATORY_CARE_PROVIDER_SITE_OTHER): Payer: Medicare Other | Admitting: Internal Medicine

## 2013-12-04 ENCOUNTER — Encounter (HOSPITAL_COMMUNITY): Payer: Medicare Other

## 2013-12-04 ENCOUNTER — Ambulatory Visit (INDEPENDENT_AMBULATORY_CARE_PROVIDER_SITE_OTHER): Payer: Medicare Other | Admitting: *Deleted

## 2013-12-04 ENCOUNTER — Encounter: Payer: Self-pay | Admitting: Internal Medicine

## 2013-12-04 VITALS — BP 114/76 | HR 58 | Ht 65.0 in | Wt 277.8 lb

## 2013-12-04 DIAGNOSIS — Z7901 Long term (current) use of anticoagulants: Secondary | ICD-10-CM

## 2013-12-04 DIAGNOSIS — I4891 Unspecified atrial fibrillation: Secondary | ICD-10-CM | POA: Diagnosis not present

## 2013-12-04 DIAGNOSIS — I10 Essential (primary) hypertension: Secondary | ICD-10-CM

## 2013-12-04 DIAGNOSIS — I5032 Chronic diastolic (congestive) heart failure: Secondary | ICD-10-CM

## 2013-12-04 DIAGNOSIS — G4730 Sleep apnea, unspecified: Secondary | ICD-10-CM

## 2013-12-04 DIAGNOSIS — I480 Paroxysmal atrial fibrillation: Secondary | ICD-10-CM

## 2013-12-04 DIAGNOSIS — Z5181 Encounter for therapeutic drug level monitoring: Secondary | ICD-10-CM

## 2013-12-04 LAB — POCT INR: INR: 2.1

## 2013-12-04 NOTE — Progress Notes (Signed)
Date:  12/05/2013   Patient ID:  [name], DOB 1945-07-26, MRN [id number]   PCP:  Ruthe Mannanalia Aron, MD  Cardiologist:  Dr. Antoine PocheHochrein  History of Present Illness:  [name] is a 68 y.o. female with history of CAD s/p PCI 2010, PAF, chronic diastolic CHF, HTN, HL, and apical hypertrophy. [name] is here today for EP evaluation for recent episodes of afib.   [name] was placed on Tikosyn in 07/2008 at 500mcg with cardioversion but had a NSTEMI 2 weeks later with further QT prolongation, thus Tikosyn was downtitrated to 253mcg. [name] was treated with a bare-metal stent to the LAD at that time. At OV in 01/2011 [name] was back in AF but did not feel any different than when [name] did in NSR thus Tikosyn was stopped and rate control strategy was pursued. [name] was seen for progressive dyspnea with possible a/c diastolic CHF in 06/2011 - [name] was back in AF. Nuc at that time showed no ischemia, EF 75%. The decision was made to re-challenge her with Tikosyn to see if this helped her dyspnea. [name] was admitted 08/2011 for this and underwent DCCV during that admission. [name] has been on 500mcg BID since that time.   [name] recently has had two episodes of afib. The first was this summer following the death of her husband. [name] visited the grave shortly after his death and got upset and went into afib with rvr, associated with chest pain and diaphoresis. [name] had converted by the time [name] arrived in the ER, was admitted to R/O. [name] subsequently had LHC which showed patent stent. A couple of weeks ago, [name] was at cardiac rehab and was asymptomatic but afib was picked up on the monitor and [name] was referred to the clinic. BB was increased and [name] was referred here.  [name] has chronic dyspnea with exertion, probably mutifactorial and for the most part thinks [name] is maintaining SR, or is asymptomatic with breakthrough afib when it occurs. [name] has sleep apnea, wears cpap regularly and isexercising on a regular basis in cardiac rehab. [name] is  limited in her exercise ability due to bilateral knee replacement.  [name] is in [facility] today.   Recent Labs: 02/07/2013: Pro B Natriuretic peptide (BNP) 2359.0*  09/23/2013: HDL Cholesterol by NMR 41; LDL (calc) 140*; TSH 3.270  09/25/2013: Hemoglobin 13.8  11/27/2013: Creatinine 1.2; Potassium 4.2   Wt Readings from Last 3 Encounters:  12/04/13 277 lb 12.8 oz (126.009 kg)  11/23/13 278 lb (126.1 kg)  11/12/13 278 lb 3.5 oz (126.2 kg)     Past Medical History  Diagnosis Date  . Hyperlipidemia   . Hypertension   . Osteoporosis   . PAF (paroxysmal atrial fibrillation)     a. s/p multiple cardioversions;  b. 2010: on Tikosyn - dose decreased from 500mcg to 253mcg in setting of NSTEMI/QT prolongation. Did not hold NSR. c. admitted 08/2011 with loading at 500mcg BID with DCCV at that time;  d. Rate controlled & on coumadin.  [name]. CAD (coronary artery disease) 07/2008    a. 07/2008 NSTEMI ->Cath: LM nl, LAD 70p/90p, 7753m, LCX nl, RCA nl, EF 60%.  The LAD was stented with a 2.25x1020mm Veri-Flex BMS.  b. 8/15 cath patent stent, nonobstructive disease otherwise - unchanged. Elevated troponin felt due to demand ischemia in setting of AF-RVR.  . Duodenitis   . Headache(784.0)   . Chronic diastolic CHF (congestive heart failure)     a. 07/2008 Echo: EF 60-65%,  Triv AI/MR, Mild TR  . Morbid obesity   . Difficult intubation   . History of positive PPD   . Arthritis   . Apical variant hypertrophic cardiomyopathy 09/24/2013    Current Outpatient Prescriptions  Medication Sig Dispense Refill  . acetaminophen (TYLENOL) 500 MG tablet Take 1,000 mg by mouth every 6 (six) hours as needed for headache.      [name] atorvastatin (LIPITOR) 40 MG tablet Take 1 tablet (40 mg total) by mouth daily at 6 PM.  90 tablet  3  . dofetilide (TIKOSYN) 500 MCG capsule Take 1 capsule (500 mcg total) by mouth every 12 (twelve) hours.  180 capsule  6  . furosemide (LASIX) 20 MG tablet Take 1 tablet (20 mg total) by mouth  daily.  90 tablet  3  . magnesium oxide (MAG-OX) 400 MG tablet Take 1 tablet (400 mg total) by mouth daily.  90 tablet  3  . metoprolol (LOPRESSOR) 100 MG tablet Take 1 tablet (100 mg total) by mouth 2 (two) times daily.  60 tablet  6  . nitroGLYCERIN (NITROSTAT) 0.4 MG SL tablet Place 1 tablet (0.4 mg total) under the tongue every 5 (five) minutes x 3 doses as needed for chest pain.  25 tablet  12  . potassium chloride SA (K-DUR,KLOR-CON) 20 MEQ tablet Take 1 tablet (20 mEq total) by mouth daily.  30 tablet  11  . warfarin (COUMADIN) 5 MG tablet Take 0.5 tablets (2.5 mg total) by mouth daily.  90 tablet  3   No current facility-administered medications for this visit.    Allergies:   Rosuvastatin     ROS:  Please see the history of present illness.    All other systems reviewed and negative.   PHYSICAL EXAM:  VS:  BP 114/76  Pulse 58  Ht 5\' 5"  (1.651 m)  Wt 277 lb 12.8 oz (126.009 kg)  BMI 46.23 kg/m2 Well nourished, well developed, obese WF in no acute distress HEENT: normal Neck: no JVD Cardia c:  Regular rate and rhythm,  normal S1, S2;; no murmur Lungs:  clear to auscultation bilaterally, no wheezing, rhonchi or rales Abd: soft, nontender, no hepatomegaly Ext: no edema Skin: warm and dry Neuro:  moves all extremities spontaneously, no focal abnormalities noted  EKG:  Sinus bradycardia at 58 bpm, ST/T wave changes(unchanged)  ASSESSMENT AND PLAN:  1. Persistent AFib - other than two brief episodes since August, pt is maintaining SR. Currently, AF management is optimal and for the most part still working well for the patient. Do not think improvement can be made in her management at this time. Continue Tikosyn 500 mg bid as well as metoprolol 100 mg bid. 2. Noac vrs warfarin- INR 2.1 this am. Pt tolerating warfarin at this time and is not interested in switching over to NOAC due to cost. 3. CAD-stable, continue cardiac rehab and weight loss efforts. 4. Diastolic  HF-stable 5. Sleep apnea- continue cpap 6. Obesity- Body mass index is 46.23 kg/(m^2). 7. F/u with Dr. Antoine Poche as scheduled and here as necessary.    Elvina Sidle Matthew Folks   I have seen, examined the patient, and reviewed the above assessment and plan with Rudi Coco NP.  Changes to above are made where necessary.  I think that [name] is probably optimized at this time.  [name] really needs to work on lifestyle modification if we are going to be successful with management of her AF long term.  I have strongly encouraged weight reduction  and regular exercise.  [name] is participating in cardiac rehab presently.  Compliance with CPAP was also encouraged today.  [name] will follow-up with Dr Antoine Poche.  [name] can return to see Lupita Leash in the AF clinic as needed.  Co Sign: Hillis Range, MD 12/05/2013 11:12 AM

## 2013-12-04 NOTE — Patient Instructions (Signed)
Your physician recommends that you schedule a follow-up appointment as needed with Dr Johney Frame as needed and as scheduled with Dr Antoine Poche

## 2013-12-07 ENCOUNTER — Encounter (HOSPITAL_COMMUNITY)
Admission: RE | Admit: 2013-12-07 | Discharge: 2013-12-07 | Disposition: A | Discharge status: Home / Self Care | Payer: Medicare Other | Source: Ambulatory Visit | Attending: Cardiology | Admitting: Cardiology

## 2013-12-07 DIAGNOSIS — Z79899 Other long term (current) drug therapy: Secondary | ICD-10-CM | POA: Insufficient documentation

## 2013-12-07 DIAGNOSIS — Z5189 Encounter for other specified aftercare: Secondary | ICD-10-CM | POA: Diagnosis not present

## 2013-12-07 DIAGNOSIS — I5032 Chronic diastolic (congestive) heart failure: Secondary | ICD-10-CM | POA: Diagnosis not present

## 2013-12-07 DIAGNOSIS — M810 Age-related osteoporosis without current pathological fracture: Secondary | ICD-10-CM | POA: Insufficient documentation

## 2013-12-07 DIAGNOSIS — E785 Hyperlipidemia, unspecified: Secondary | ICD-10-CM | POA: Diagnosis not present

## 2013-12-07 DIAGNOSIS — Z9861 Coronary angioplasty status: Secondary | ICD-10-CM | POA: Diagnosis not present

## 2013-12-07 DIAGNOSIS — I422 Other hypertrophic cardiomyopathy: Secondary | ICD-10-CM | POA: Insufficient documentation

## 2013-12-07 DIAGNOSIS — I252 Old myocardial infarction: Secondary | ICD-10-CM | POA: Insufficient documentation

## 2013-12-07 DIAGNOSIS — Z6841 Body Mass Index (BMI) 40.0 and over, adult: Secondary | ICD-10-CM | POA: Diagnosis not present

## 2013-12-07 DIAGNOSIS — Z7901 Long term (current) use of anticoagulants: Secondary | ICD-10-CM | POA: Diagnosis not present

## 2013-12-07 DIAGNOSIS — I4891 Unspecified atrial fibrillation: Secondary | ICD-10-CM | POA: Insufficient documentation

## 2013-12-07 DIAGNOSIS — I10 Essential (primary) hypertension: Secondary | ICD-10-CM | POA: Diagnosis not present

## 2013-12-07 DIAGNOSIS — Z96659 Presence of unspecified artificial knee joint: Secondary | ICD-10-CM | POA: Insufficient documentation

## 2013-12-07 DIAGNOSIS — G4733 Obstructive sleep apnea (adult) (pediatric): Secondary | ICD-10-CM | POA: Insufficient documentation

## 2013-12-09 ENCOUNTER — Encounter (HOSPITAL_COMMUNITY): Payer: Medicare Other

## 2013-12-09 DIAGNOSIS — Z5189 Encounter for other specified aftercare: Secondary | ICD-10-CM | POA: Diagnosis not present

## 2013-12-11 ENCOUNTER — Encounter (HOSPITAL_COMMUNITY)
Admission: RE | Admit: 2013-12-11 | Discharge: 2013-12-11 | Disposition: A | Discharge status: Home / Self Care | Payer: Medicare Other | Source: Ambulatory Visit | Attending: Cardiology | Admitting: Cardiology

## 2013-12-11 DIAGNOSIS — Z5189 Encounter for other specified aftercare: Secondary | ICD-10-CM | POA: Diagnosis not present

## 2013-12-14 ENCOUNTER — Encounter (HOSPITAL_COMMUNITY)
Admission: RE | Admit: 2013-12-14 | Discharge: 2013-12-14 | Disposition: A | Discharge status: Home / Self Care | Payer: Medicare Other | Source: Ambulatory Visit | Attending: Cardiology | Admitting: Cardiology

## 2013-12-14 DIAGNOSIS — Z5189 Encounter for other specified aftercare: Secondary | ICD-10-CM | POA: Diagnosis not present

## 2013-12-14 LAB — GLUCOSE, CAPILLARY: Glucose-Capillary: 93 mg/dL (ref 70–99)

## 2013-12-14 NOTE — Progress Notes (Addendum)
Pt c/o feeling nauseated and clammy on the bike. Telemetry rhythm Sinus 63. Exercise stopped.  Blood pressure 142/70. Patient denies having chest pain. Patient reports having a sore throat since Friday. Patient says she was given an appointment to be seen this morning. But took an appointment for Thursday because she wanted to come to exercise at cardiac rehab today. Temperature 97.7.  Oxygen level 98% on room air. Dr Elmer Sow office called patient given appointment to be evaluated tomorrow at 11:15. Advised patient that she not exercise anymore today. Exit blood pressure 122/70. CBG 93. Patient given a copy of her exercise flow sheets to give to Dr Dayton Martes. Patient's nausea, clamminess resolved after exercise stopped and she sat down.

## 2013-12-15 ENCOUNTER — Encounter: Payer: Self-pay | Admitting: Internal Medicine

## 2013-12-15 ENCOUNTER — Ambulatory Visit (INDEPENDENT_AMBULATORY_CARE_PROVIDER_SITE_OTHER): Payer: Medicare Other | Admitting: Internal Medicine

## 2013-12-15 VITALS — BP 148/70 | HR 59 | Temp 100.2°F | Wt 278.5 lb

## 2013-12-15 DIAGNOSIS — L988 Other specified disorders of the skin and subcutaneous tissue: Secondary | ICD-10-CM

## 2013-12-15 DIAGNOSIS — J069 Acute upper respiratory infection, unspecified: Secondary | ICD-10-CM

## 2013-12-15 DIAGNOSIS — I2510 Atherosclerotic heart disease of native coronary artery without angina pectoris: Secondary | ICD-10-CM | POA: Diagnosis not present

## 2013-12-15 DIAGNOSIS — N3945 Continuous leakage: Secondary | ICD-10-CM

## 2013-12-15 DIAGNOSIS — B9789 Other viral agents as the cause of diseases classified elsewhere: Principal | ICD-10-CM

## 2013-12-15 MED ORDER — HYDROCODONE-HOMATROPINE 5-1.5 MG/5ML PO SYRP: 5.0000 mL | ORAL_SOLUTION | Freq: Three times a day (TID) | ORAL | Status: DC | PRN

## 2013-12-15 NOTE — Progress Notes (Signed)
Subjective:    Patient ID: [name], female    DOB: 07-Mar-1945, 68 y.o.   MRN: [id number]  HPI  Pt presents to the clinic today with c/o runny nose, sore throat and cough. She reprots this started 5 days ago. The cough is unproductive. She has had some shortness of breath but this is her usual (CHF). She denies fever, chills or body aches. She has not tried anything OTC. She has not had sick contacts that she is aware of. She reports that she does not feel sick.  Additionally, she c/o vaginal irritation. She reports this started 4 days ago. She has tried using vagisil without any relief. She denies urinary symptoms or vaginal discharge.  Review of Systems      Past Medical History  Diagnosis Date  . Hyperlipidemia   . Hypertension   . Osteoporosis   . PAF (paroxysmal atrial fibrillation)     a. s/p multiple cardioversions;  b. 2010: on Tikosyn - dose decreased from 500mcg to 216mcg in setting of NSTEMI/QT prolongation. Did not hold NSR. c. admitted 08/2011 with loading at 500mcg BID with DCCV at that time;  d. Rate controlled & on coumadin.  [name]. CAD (coronary artery disease) 07/2008    a. 07/2008 NSTEMI ->Cath: LM nl, LAD 70p/90p, 4316m, LCX nl, RCA nl, EF 60%.  The LAD was stented with a 2.25x2320mm Veri-Flex BMS.  b. 8/15 cath patent stent, nonobstructive disease otherwise - unchanged. Elevated troponin felt due to demand ischemia in setting of AF-RVR.  . Duodenitis   . Headache(784.0)   . Chronic diastolic CHF (congestive heart failure)     a. 07/2008 Echo: EF 60-65%, Triv AI/MR, Mild TR  . Morbid obesity   . Difficult intubation   . History of positive PPD   . Arthritis   . Apical variant hypertrophic cardiomyopathy 09/24/2013    Current Outpatient Prescriptions  Medication Sig Dispense Refill  . acetaminophen (TYLENOL) 500 MG tablet Take 1,000 mg by mouth every 6 (six) hours as needed for headache.    [name]. atorvastatin (LIPITOR) 40 MG tablet Take 1 tablet (40 mg total) by mouth  daily at 6 PM. 90 tablet 3  . dofetilide (TIKOSYN) 500 MCG capsule Take 1 capsule (500 mcg total) by mouth every 12 (twelve) hours. 180 capsule 6  . furosemide (LASIX) 20 MG tablet Take 1 tablet (20 mg total) by mouth daily. 90 tablet 3  . magnesium oxide (MAG-OX) 400 MG tablet Take 1 tablet (400 mg total) by mouth daily. 90 tablet 3  . metoprolol (LOPRESSOR) 100 MG tablet Take 1 tablet (100 mg total) by mouth 2 (two) times daily. 60 tablet 6  . nitroGLYCERIN (NITROSTAT) 0.4 MG SL tablet Place 1 tablet (0.4 mg total) under the tongue every 5 (five) minutes x 3 doses as needed for chest pain. 25 tablet 12  . potassium chloride SA (K-DUR,KLOR-CON) 20 MEQ tablet Take 1 tablet (20 mEq total) by mouth daily. 30 tablet 11  . warfarin (COUMADIN) 5 MG tablet Take 0.5 tablets (2.5 mg total) by mouth daily. 90 tablet 3   No current facility-administered medications for this visit.    Allergies  Allergen Reactions  . Rosuvastatin Other (See Comments)    REACTION: muscle and joint pain    Family History  Problem Relation Age of Onset  . Other Mother     GI Bleed  . Pneumonia Father   . Coronary artery disease Brother   . Breast cancer Paternal Grandmother   .  Rectal cancer Paternal Grandfather     History   Social History  . Marital Status: Married    Spouse Name: N/A    Number of Children: 2  . Years of Education: N/A   Occupational History  . retired    Social History Main Topics  . Smoking status: Never Smoker   . Smokeless tobacco: Never Used  . Alcohol Use: No  . Drug Use: No  . Sexual Activity: Not Currently    Birth Control/ Protection: Post-menopausal   Other Topics Concern  . Not on file   Social History Narrative     Constitutional: Denies fever, malaise, fatigue, headache or abrupt weight changes.  HEENT: Pt reports runny nose, sore throat and cough. Denies eye pain, eye redness, ear pain, ringing in the ears, wax buildup, nasal congestion, bloody  nose. Respiratory: Pt reports cough and shortness of breath. Denies difficulty breathing or sputum production.   Cardiovascular: Denies chest pain, chest tightness, palpitations or swelling in the hands or feet.  Gastrointestinal: Denies abdominal pain, bloating, constipation, diarrhea or blood in the stool.  GU: Pt reports vaginal irritation. Denies urgency, frequency, pain with urination, burning sensation, blood in urine, odor or discharge.  No other specific complaints in a complete review of systems (except as listed in HPI above).  Objective:   Physical Exam  BP 148/70 mmHg  Pulse 59  Temp(Src) 100.2 F (37.9 C) (Oral)  Wt 278 lb 8 oz (126.327 kg)  SpO2 98% Wt Readings from Last 3 Encounters:  12/15/13 278 lb 8 oz (126.327 kg)  12/04/13 277 lb 12.8 oz (126.009 kg)  11/23/13 278 lb (126.1 kg)    General: Appears her stated age, obese but well developed, well nourished in NAD. [name] HEENT: Head: normal shape and size;  Ears: bilateral cerumen impaction; Nose: mucosa pink and moist, septum midline; Throat/Mouth: Teeth present, mucosa pink and moist, no exudate, lesions or ulcerations noted.  Cardiovascular: Normal rate and rhythm. S1,S2 noted.  Murmur noted. No  rubs or gallops noted. No JVD. TraceBLE edema.  Pulmonary/Chest: Normal effort and positive vesicular breath sounds. No respiratory distress. No wheezes, rales or ronchi noted.  Abdomen: Soft and nontender. Normal bowel sounds, no bruits noted. No distention or masses noted. Liver, spleen and kidneys non palpable. Pelvic: Normal female anatomy. Inner labia is very macerated. Urine leakage noted.  BMET    Component Value Date/Time   NA 140 11/27/2013 1132   K 4.2 11/27/2013 1132   CL 105 11/27/2013 1132   CO2 20 11/27/2013 1132   GLUCOSE 89 11/27/2013 1132   BUN 22 11/27/2013 1132   CREATININE 1.2 11/27/2013 1132   CALCIUM 9.5 11/27/2013 1132   GFRNONAA 61* 09/24/2013 0518   GFRAA 71* 09/24/2013 0518    Lipid Panel      Component Value Date/Time   CHOL 205* 09/23/2013 0100   TRIG 122 09/23/2013 0100   HDL 41 09/23/2013 0100   CHOLHDL 5.0 09/23/2013 0100   VLDL 24 09/23/2013 0100   LDLCALC 140* 09/23/2013 0100    CBC    Component Value Date/Time   WBC 9.0 09/25/2013 0427   RBC 4.95 09/25/2013 0427   HGB 13.8 09/25/2013 0427   HCT 42.6 09/25/2013 0427   PLT 221 09/25/2013 0427   MCV 86.1 09/25/2013 0427   MCH 27.9 09/25/2013 0427   MCHC 32.4 09/25/2013 0427   RDW 13.6 09/25/2013 0427   LYMPHSABS 1.4 09/22/2013 1839   MONOABS 0.5 09/22/2013 1839   EOSABS  0.1 09/22/2013 1839   BASOSABS 0.0 09/22/2013 1839    Hgb A1C Lab Results  Component Value Date   HGBA1C 5.7* 09/23/2013         Assessment & Plan:   Skin maceration secondary to bladder leakage:  Try some [place]- apply twice a day Continue to wear pads Follow up with PCP to discuss treatment for incontinence  Viral URI with cough:  I don't think this is CHF related- no crackles, no increased edema RX for Hycodan cough syrup Try some Mucinex OTC  RTC as needed or if symptoms persist or worsen

## 2013-12-15 NOTE — Progress Notes (Signed)
Pre visit review using our clinic review tool, if applicable. No additional management support is needed unless otherwise documented below in the visit note. 

## 2013-12-15 NOTE — Patient Instructions (Signed)
Cough, Adult  A cough is a reflex that helps clear your throat and airways. It can help heal the body or may be a reaction to an irritated airway. A cough may only last 2 or 3 weeks (acute) or may last more than 8 weeks (chronic).  CAUSES Acute cough:  Viral or bacterial infections. Chronic cough:  Infections.  Allergies.  Asthma.  Post-nasal drip.  Smoking.  Heartburn or acid reflux.  Some medicines.  Chronic lung problems (COPD).  Cancer. SYMPTOMS   Cough.  Fever.  Chest pain.  Increased breathing rate.  High-pitched whistling sound when breathing (wheezing).  Colored mucus that you cough up (sputum). TREATMENT   A bacterial cough may be treated with antibiotic medicine.  A viral cough must run its course and will not respond to antibiotics.  Your caregiver may recommend other treatments if you have a chronic cough. HOME CARE INSTRUCTIONS   Only take over-the-counter or prescription medicines for pain, discomfort, or fever as directed by your caregiver. Use cough suppressants only as directed by your caregiver.  Use a cold steam vaporizer or humidifier in your bedroom or home to help loosen secretions.  Sleep in a semi-upright position if your cough is worse at night.  Rest as needed.  Stop smoking if you smoke. SEEK IMMEDIATE MEDICAL CARE IF:   You have pus in your sputum.  Your cough starts to worsen.  You cannot control your cough with suppressants and are losing sleep.  You begin coughing up blood.  You have difficulty breathing.  You develop pain which is getting worse or is uncontrolled with medicine.  You have a fever. MAKE SURE YOU:   Understand these instructions.  Will watch your condition.  Will get help right away if you are not doing well or get worse. Document Released: 07/21/2010 Document Revised: 04/16/2011 Document Reviewed: 07/21/2010 Exit[employer] Patient Information 2015 [employer]. This information is not intended  to replace advice given to you by your health care provider. Make sure you discuss any questions you have with your health care provider.  

## 2013-12-16 ENCOUNTER — Encounter (HOSPITAL_COMMUNITY): Payer: Medicare Other

## 2013-12-17 ENCOUNTER — Ambulatory Visit: Payer: Medicare Other | Admitting: Family Medicine

## 2013-12-18 ENCOUNTER — Telehealth (HOSPITAL_COMMUNITY): Payer: Self-pay | Admitting: Family Medicine

## 2013-12-18 ENCOUNTER — Encounter (HOSPITAL_COMMUNITY): Payer: Medicare Other

## 2013-12-21 ENCOUNTER — Encounter (HOSPITAL_COMMUNITY)
Admission: RE | Admit: 2013-12-21 | Discharge: 2013-12-21 | Disposition: A | Discharge status: Home / Self Care | Payer: Medicare Other | Source: Ambulatory Visit | Attending: Cardiology | Admitting: Cardiology

## 2013-12-21 DIAGNOSIS — Z5189 Encounter for other specified aftercare: Secondary | ICD-10-CM | POA: Diagnosis not present

## 2013-12-22 ENCOUNTER — Ambulatory Visit (INDEPENDENT_AMBULATORY_CARE_PROVIDER_SITE_OTHER): Payer: Medicare Other | Admitting: Family Medicine

## 2013-12-22 ENCOUNTER — Encounter: Payer: Self-pay | Admitting: Family Medicine

## 2013-12-22 VITALS — BP 128/82 | HR 52 | Temp 97.7°F | Wt 277.5 lb

## 2013-12-22 DIAGNOSIS — J069 Acute upper respiratory infection, unspecified: Secondary | ICD-10-CM | POA: Diagnosis not present

## 2013-12-22 DIAGNOSIS — B9789 Other viral agents as the cause of diseases classified elsewhere: Principal | ICD-10-CM

## 2013-12-22 DIAGNOSIS — I2510 Atherosclerotic heart disease of native coronary artery without angina pectoris: Secondary | ICD-10-CM

## 2013-12-22 DIAGNOSIS — R05 Cough: Secondary | ICD-10-CM | POA: Diagnosis not present

## 2013-12-22 DIAGNOSIS — R059 Cough, unspecified: Secondary | ICD-10-CM

## 2013-12-22 MED ORDER — HYDROCODONE-HOMATROPINE 5-1.5 MG/5ML PO SYRP: 5.0000 mL | ORAL_SOLUTION | Freq: Three times a day (TID) | ORAL | Status: DC | PRN

## 2013-12-22 MED ORDER — AMOXICILLIN 875 MG PO TABS: 875.0000 mg | ORAL_TABLET | Freq: Two times a day (BID) | ORAL | Status: DC

## 2013-12-22 NOTE — Assessment & Plan Note (Signed)
Deteriorated with wheezes on exam. Given duration and progression of symptoms, will treat for bacterial bronchitis with amoxicillin. She is aware that this can effect thickness of blood. Has INR recheck scheduled for next week. Refilled hycodan. Call or return to clinic prn if these symptoms worsen or fail to improve as anticipated. The patient indicates understanding of these issues and agrees with the plan.

## 2013-12-22 NOTE — Progress Notes (Signed)
Subjective:   Patient ID: [name], female    DOB: [birth year], 68 y.o.   MRN: [id number]  [name] is a pleasant 68 y.o. year old female who presents to clinic today with Cough  on 12/22/2013  HPI: Worsening cough and congestion.  Saw regina Baity 7 days ago for 5 day h/o dry cough.  Advised to take Hycodan and supportive care for viral illness. Since then, cough is more productive.  Does have h/o CHF, Afib- does not feel more SOB than usual. No fevers.  Current Outpatient Prescriptions on File Prior to Visit  Medication Sig Dispense Refill  . acetaminophen (TYLENOL) 500 MG tablet Take 1,000 mg by mouth every 6 (six) hours as needed for headache.    [name] atorvastatin (LIPITOR) 40 MG tablet Take 1 tablet (40 mg total) by mouth daily at 6 PM. 90 tablet 3  . dofetilide (TIKOSYN) 500 MCG capsule Take 1 capsule (500 mcg total) by mouth every 12 (twelve) hours. 180 capsule 6  . furosemide (LASIX) 20 MG tablet Take 1 tablet (20 mg total) by mouth daily. 90 tablet 3  . magnesium oxide (MAG-OX) 400 MG tablet Take 1 tablet (400 mg total) by mouth daily. 90 tablet 3  . metoprolol (LOPRESSOR) 100 MG tablet Take 1 tablet (100 mg total) by mouth 2 (two) times daily. 60 tablet 6  . nitroGLYCERIN (NITROSTAT) 0.4 MG SL tablet Place 1 tablet (0.4 mg total) under the tongue every 5 (five) minutes x 3 doses as needed for chest pain. 25 tablet 12  . potassium chloride SA (K-DUR,KLOR-CON) 20 MEQ tablet Take 1 tablet (20 mEq total) by mouth daily. 30 tablet 11  . warfarin (COUMADIN) 5 MG tablet Take 0.5 tablets (2.5 mg total) by mouth daily. 90 tablet 3   No current facility-administered medications on file prior to visit.    Allergies  Allergen Reactions  . Rosuvastatin Other (See Comments)    REACTION: muscle and joint pain    Past Medical History  Diagnosis Date  . Hyperlipidemia   . Hypertension   . Osteoporosis   . PAF (paroxysmal atrial fibrillation)     a. s/p multiple  cardioversions;  b. 2010: on Tikosyn - dose decreased from to in setting of NSTEMI/QT prolongation. Did not hold NSR. c. admitted 08/2011 with loading at BID with DCCV at that time;  d. Rate controlled & on coumadin.  [name] CAD (coronary artery disease) 07/2008    a. 07/2008 NSTEMI ->Cath: LM nl, LAD 70p/90p, 36m, LCX nl, RCA nl, EF 60%.  The LAD was stented with a 2.25x35mm Veri-Flex BMS.  b. 8/15 cath patent stent, nonobstructive disease otherwise - unchanged. Elevated troponin felt due to demand ischemia in setting of AF-RVR.  . Duodenitis   . Headache(784.0)   . Chronic diastolic CHF (congestive heart failure)     a. 07/2008 Echo: EF 60-65%, Triv AI/MR, Mild TR  . Morbid obesity   . Difficult intubation   . History of positive PPD   . Arthritis   . Apical variant hypertrophic cardiomyopathy 09/24/2013    Past Surgical History  Procedure Laterality Date  . Cholecystectomy    . Abdominal hysterectomy      partial, bleeding  . Knee arthroscopy  2000 & 2001    left and right  . Esophagogastroduodenoscopy  2003  . Coronary stent placement    . Tubal ligation    . Total knee arthroplasty      bilateral  Family History  Problem Relation Age of Onset  . Other Mother     GI Bleed  . Pneumonia Father   . Coronary artery disease Brother   . Breast cancer Paternal Grandmother   . Rectal cancer Paternal Grandfather     History   Social History  . Marital Status: Married    Spouse Name: N/A    Number of Children: 2  . Years of Education: N/A   Occupational History  . retired    Social History Main Topics  . Smoking status: Never Smoker   . Smokeless tobacco: Never Used  . Alcohol Use: No  . Drug Use: No  . Sexual Activity: Not Currently    Birth Control/ Protection: Post-menopausal   Other Topics Concern  . Not on file   Social History Narrative   The PMH, PSH, Social History, Family History, Medications, and allergies have been [facility], and  have been updated if relevant.   Review of Systems See HPI +urinary incontinence with coughing No CP No rashes No new LE edema    Objective:    BP 128/82 mmHg  Pulse 52  Temp(Src) 97.7 F (36.5 C) (Oral)  Wt 277 lb 8 oz (125.873 kg)  SpO2 95%   Physical Exam  Constitutional: She appears well-developed and well-nourished. No distress.  HENT:  Head: Normocephalic.  Pulmonary/Chest: Effort normal. No respiratory distress. She has wheezes. She has no rales. She exhibits no tenderness.  Scattered exp wheezes, does have coughing fit in room  Skin: Skin is warm and dry.  Psychiatric: She has a normal mood and affect. Her behavior is normal. Judgment and thought content normal.          Assessment & Plan:   Viral URI with cough - Plan: HYDROcodone-homatropine (HYCODAN) 5-1.5 MG/5ML syrup No Follow-up on file.

## 2013-12-22 NOTE — Patient Instructions (Addendum)
Take Amoxicillin as directed- 1 tablet twice daily for 10 days. Keep your appointment for your PT/INR check on Monday.  Keep me updated.

## 2013-12-22 NOTE — Progress Notes (Signed)
Pre visit review using our clinic review tool, if applicable. No additional management support is needed unless otherwise documented below in the visit note. 

## 2013-12-23 ENCOUNTER — Encounter (HOSPITAL_COMMUNITY)
Admission: RE | Admit: 2013-12-23 | Discharge: 2013-12-23 | Disposition: A | Discharge status: Home / Self Care | Payer: Medicare Other | Source: Ambulatory Visit | Attending: Cardiology | Admitting: Cardiology

## 2013-12-23 ENCOUNTER — Encounter: Payer: Self-pay | Admitting: Family Medicine

## 2013-12-23 DIAGNOSIS — Z5189 Encounter for other specified aftercare: Secondary | ICD-10-CM | POA: Diagnosis not present

## 2013-12-24 ENCOUNTER — Ambulatory Visit: Payer: Medicare Other | Admitting: Family Medicine

## 2013-12-25 ENCOUNTER — Encounter (HOSPITAL_COMMUNITY)
Admission: RE | Admit: 2013-12-25 | Discharge: 2013-12-25 | Disposition: A | Discharge status: Home / Self Care | Payer: Medicare Other | Source: Ambulatory Visit | Attending: Cardiology | Admitting: Cardiology

## 2013-12-25 DIAGNOSIS — Z5189 Encounter for other specified aftercare: Secondary | ICD-10-CM | POA: Diagnosis not present

## 2013-12-28 ENCOUNTER — Ambulatory Visit (INDEPENDENT_AMBULATORY_CARE_PROVIDER_SITE_OTHER): Payer: Medicare Other

## 2013-12-28 ENCOUNTER — Encounter (HOSPITAL_COMMUNITY)
Admission: RE | Admit: 2013-12-28 | Discharge: 2013-12-28 | Disposition: A | Discharge status: Home / Self Care | Payer: Medicare Other | Source: Ambulatory Visit | Attending: Cardiology | Admitting: Cardiology

## 2013-12-28 DIAGNOSIS — I4891 Unspecified atrial fibrillation: Secondary | ICD-10-CM | POA: Diagnosis not present

## 2013-12-28 DIAGNOSIS — Z5181 Encounter for therapeutic drug level monitoring: Secondary | ICD-10-CM | POA: Diagnosis not present

## 2013-12-28 DIAGNOSIS — I480 Paroxysmal atrial fibrillation: Secondary | ICD-10-CM

## 2013-12-28 DIAGNOSIS — Z5189 Encounter for other specified aftercare: Secondary | ICD-10-CM | POA: Diagnosis not present

## 2013-12-28 DIAGNOSIS — Z7901 Long term (current) use of anticoagulants: Secondary | ICD-10-CM

## 2013-12-28 LAB — POCT INR: INR: 2.3

## 2013-12-30 ENCOUNTER — Encounter (HOSPITAL_COMMUNITY): Payer: Medicare Other

## 2014-01-04 ENCOUNTER — Encounter (HOSPITAL_COMMUNITY)
Admission: RE | Admit: 2014-01-04 | Discharge: 2014-01-04 | Disposition: A | Discharge status: Home / Self Care | Payer: Medicare Other | Source: Ambulatory Visit | Attending: Cardiology | Admitting: Cardiology

## 2014-01-04 DIAGNOSIS — Z5189 Encounter for other specified aftercare: Secondary | ICD-10-CM | POA: Diagnosis not present

## 2014-01-06 ENCOUNTER — Encounter (HOSPITAL_COMMUNITY)
Admission: RE | Admit: 2014-01-06 | Discharge: 2014-01-06 | Disposition: A | Discharge status: Home / Self Care | Payer: Medicare Other | Source: Ambulatory Visit | Attending: Cardiology | Admitting: Cardiology

## 2014-01-06 DIAGNOSIS — Z96659 Presence of unspecified artificial knee joint: Secondary | ICD-10-CM | POA: Insufficient documentation

## 2014-01-06 DIAGNOSIS — Z6841 Body Mass Index (BMI) 40.0 and over, adult: Secondary | ICD-10-CM | POA: Diagnosis not present

## 2014-01-06 DIAGNOSIS — M810 Age-related osteoporosis without current pathological fracture: Secondary | ICD-10-CM | POA: Insufficient documentation

## 2014-01-06 DIAGNOSIS — E785 Hyperlipidemia, unspecified: Secondary | ICD-10-CM | POA: Diagnosis not present

## 2014-01-06 DIAGNOSIS — I10 Essential (primary) hypertension: Secondary | ICD-10-CM | POA: Insufficient documentation

## 2014-01-06 DIAGNOSIS — Z7901 Long term (current) use of anticoagulants: Secondary | ICD-10-CM | POA: Diagnosis not present

## 2014-01-06 DIAGNOSIS — I422 Other hypertrophic cardiomyopathy: Secondary | ICD-10-CM | POA: Diagnosis not present

## 2014-01-06 DIAGNOSIS — Z9861 Coronary angioplasty status: Secondary | ICD-10-CM | POA: Diagnosis not present

## 2014-01-06 DIAGNOSIS — Z79899 Other long term (current) drug therapy: Secondary | ICD-10-CM | POA: Diagnosis not present

## 2014-01-06 DIAGNOSIS — I4891 Unspecified atrial fibrillation: Secondary | ICD-10-CM | POA: Diagnosis not present

## 2014-01-06 DIAGNOSIS — Z5189 Encounter for other specified aftercare: Secondary | ICD-10-CM | POA: Diagnosis not present

## 2014-01-06 DIAGNOSIS — G4733 Obstructive sleep apnea (adult) (pediatric): Secondary | ICD-10-CM | POA: Diagnosis not present

## 2014-01-06 DIAGNOSIS — I252 Old myocardial infarction: Secondary | ICD-10-CM | POA: Insufficient documentation

## 2014-01-06 DIAGNOSIS — I5032 Chronic diastolic (congestive) heart failure: Secondary | ICD-10-CM | POA: Diagnosis not present

## 2014-01-08 ENCOUNTER — Encounter (HOSPITAL_COMMUNITY)
Admission: RE | Admit: 2014-01-08 | Discharge: 2014-01-08 | Disposition: A | Discharge status: Home / Self Care | Payer: Medicare Other | Source: Ambulatory Visit | Attending: Cardiology | Admitting: Cardiology

## 2014-01-08 DIAGNOSIS — Z5189 Encounter for other specified aftercare: Secondary | ICD-10-CM | POA: Diagnosis not present

## 2014-01-11 ENCOUNTER — Encounter (HOSPITAL_COMMUNITY)
Admission: RE | Admit: 2014-01-11 | Discharge: 2014-01-11 | Disposition: A | Discharge status: Home / Self Care | Payer: Medicare Other | Source: Ambulatory Visit | Attending: Cardiology | Admitting: Cardiology

## 2014-01-11 DIAGNOSIS — Z5189 Encounter for other specified aftercare: Secondary | ICD-10-CM | POA: Diagnosis not present

## 2014-01-13 ENCOUNTER — Encounter (HOSPITAL_COMMUNITY)
Admission: RE | Admit: 2014-01-13 | Discharge: 2014-01-13 | Disposition: A | Discharge status: Home / Self Care | Payer: Medicare Other | Source: Ambulatory Visit | Attending: Cardiology | Admitting: Cardiology

## 2014-01-13 DIAGNOSIS — Z5189 Encounter for other specified aftercare: Secondary | ICD-10-CM | POA: Diagnosis not present

## 2014-01-14 ENCOUNTER — Encounter (HOSPITAL_COMMUNITY): Payer: Self-pay | Admitting: Cardiovascular Disease

## 2014-01-15 ENCOUNTER — Encounter (HOSPITAL_COMMUNITY)
Admission: RE | Admit: 2014-01-15 | Discharge: 2014-01-15 | Disposition: A | Discharge status: Home / Self Care | Payer: Medicare Other | Source: Ambulatory Visit | Attending: Cardiology | Admitting: Cardiology

## 2014-01-15 DIAGNOSIS — Z5189 Encounter for other specified aftercare: Secondary | ICD-10-CM | POA: Diagnosis not present

## 2014-01-18 ENCOUNTER — Encounter (HOSPITAL_COMMUNITY)
Admission: RE | Admit: 2014-01-18 | Discharge: 2014-01-18 | Disposition: A | Discharge status: Home / Self Care | Payer: Medicare Other | Source: Ambulatory Visit | Attending: Cardiology | Admitting: Cardiology

## 2014-01-18 DIAGNOSIS — Z5189 Encounter for other specified aftercare: Secondary | ICD-10-CM | POA: Diagnosis not present

## 2014-01-19 ENCOUNTER — Ambulatory Visit: Payer: Medicare Other | Admitting: Cardiology

## 2014-01-20 ENCOUNTER — Encounter (HOSPITAL_COMMUNITY): Payer: Medicare Other

## 2014-01-22 ENCOUNTER — Encounter (HOSPITAL_COMMUNITY)
Admission: RE | Admit: 2014-01-22 | Discharge: 2014-01-22 | Disposition: A | Discharge status: Home / Self Care | Payer: Medicare Other | Source: Ambulatory Visit | Attending: Cardiology | Admitting: Cardiology

## 2014-01-22 DIAGNOSIS — Z5189 Encounter for other specified aftercare: Secondary | ICD-10-CM | POA: Diagnosis not present

## 2014-01-25 ENCOUNTER — Encounter (HOSPITAL_COMMUNITY)
Admission: RE | Admit: 2014-01-25 | Discharge: 2014-01-25 | Disposition: A | Discharge status: Home / Self Care | Payer: Medicare Other | Source: Ambulatory Visit | Attending: Cardiology | Admitting: Cardiology

## 2014-01-25 DIAGNOSIS — Z5189 Encounter for other specified aftercare: Secondary | ICD-10-CM | POA: Diagnosis not present

## 2014-01-27 ENCOUNTER — Encounter (HOSPITAL_COMMUNITY)
Admission: RE | Admit: 2014-01-27 | Discharge: 2014-01-27 | Disposition: A | Discharge status: Home / Self Care | Payer: Medicare Other | Source: Ambulatory Visit | Attending: Cardiology | Admitting: Cardiology

## 2014-01-27 DIAGNOSIS — Z5189 Encounter for other specified aftercare: Secondary | ICD-10-CM | POA: Diagnosis not present

## 2014-02-01 ENCOUNTER — Encounter (HOSPITAL_COMMUNITY)
Admission: RE | Admit: 2014-02-01 | Discharge: 2014-02-01 | Disposition: A | Discharge status: Home / Self Care | Payer: Medicare Other | Source: Ambulatory Visit | Attending: Cardiology | Admitting: Cardiology

## 2014-02-01 DIAGNOSIS — Z5189 Encounter for other specified aftercare: Secondary | ICD-10-CM | POA: Diagnosis not present

## 2014-02-03 ENCOUNTER — Ambulatory Visit (HOSPITAL_COMMUNITY)
Admission: RE | Admit: 2014-02-03 | Discharge: 2014-02-03 | Disposition: A | Discharge status: Home / Self Care | Payer: Medicare Other | Source: Ambulatory Visit | Attending: Cardiology | Admitting: Cardiology

## 2014-02-03 ENCOUNTER — Telehealth: Payer: Self-pay | Admitting: Physician Assistant

## 2014-02-03 ENCOUNTER — Encounter (HOSPITAL_COMMUNITY)
Admission: RE | Admit: 2014-02-03 | Discharge: 2014-02-03 | Disposition: A | Discharge status: Home / Self Care | Payer: Medicare Other | Source: Ambulatory Visit | Attending: Cardiology | Admitting: Cardiology

## 2014-02-03 DIAGNOSIS — I471 Supraventricular tachycardia: Secondary | ICD-10-CM | POA: Diagnosis not present

## 2014-02-03 DIAGNOSIS — Z5189 Encounter for other specified aftercare: Secondary | ICD-10-CM | POA: Diagnosis not present

## 2014-02-03 NOTE — Telephone Encounter (Signed)
Contacted by Byrd Hesselbach from cardiac rehab as patient had short burst of SVT during cardiac rehab, asymptomatic, quickly resolved. Asked Byrd Hesselbach to check an EKG which showed NSR with QTc of 440. Continue on current dose of tikosyn, however if SVT recur, would hold off further cardiac rehab until seen by Dr. Antoine Poche.  Ramond Dial PA Pager: [phone]

## 2014-02-03 NOTE — Progress Notes (Signed)
Patient was noted to have about 40 seconds of SVT rate 130's nonsustained.  Patient did report feeling this but did not feel anymore after she spontaneously converted to Sinus [place]ust[place] Foley. Blood pressure 128/82.  ECG tracings taken to [facility] PA for review. 12 lead ECG ordered. Hal reviewed [name]'s 12 lead ECG and determined that her QTC interval was okay. Hal said [name] is okay to go home and can resume exercise on Monday. If patient has any more arrhythmia's she is to hold further exercise and follow up with Dr Antoine Poche. Will fax exercise flow sheets to Dr. Jenene Slicker office for review. Exit blood pressure 128/82. Heart rate 49. No complaints upon exit from cardiac rehab.

## 2014-02-08 ENCOUNTER — Telehealth: Payer: Self-pay | Admitting: Cardiology

## 2014-02-08 ENCOUNTER — Encounter (HOSPITAL_COMMUNITY): Payer: Medicare Other

## 2014-02-08 ENCOUNTER — Telehealth (HOSPITAL_COMMUNITY): Payer: Self-pay | Admitting: Family Medicine

## 2014-02-08 ENCOUNTER — Ambulatory Visit (INDEPENDENT_AMBULATORY_CARE_PROVIDER_SITE_OTHER): Payer: Medicare Other | Admitting: *Deleted

## 2014-02-08 DIAGNOSIS — I480 Paroxysmal atrial fibrillation: Secondary | ICD-10-CM

## 2014-02-08 DIAGNOSIS — I4891 Unspecified atrial fibrillation: Secondary | ICD-10-CM

## 2014-02-08 DIAGNOSIS — Z5181 Encounter for therapeutic drug level monitoring: Secondary | ICD-10-CM

## 2014-02-08 DIAGNOSIS — Z7901 Long term (current) use of anticoagulants: Secondary | ICD-10-CM | POA: Diagnosis not present

## 2014-02-08 LAB — POCT INR: INR: 1.9

## 2014-02-08 NOTE — Telephone Encounter (Signed)
Spoke with Dr. Antoine Poche and he agrees that patient should be seen - not urgent. First available appmt 02/18/14 with C. Brion Aliment. Offered patient this appmt - she agreed and will stay out of cardiac rehab until in office eval   Patient scheduled to see C. Berge 1/41/16 @ 930am

## 2014-02-08 NOTE — Telephone Encounter (Signed)
[name] is calling because she has had two episodes of AFib , one on Thursday and one this morning , Please call    Thanks

## 2014-02-08 NOTE — Telephone Encounter (Signed)
Returned call to patient. She c/o two episodes of AF (one last Thursday while in cardiac rehab [which was 12/30], one this AM)  Last Thursday @ Cardiac Rehab - noted to have SVT - EKG performed and showed NSR but cardiology PA recommended to hold off on rehab until had eval by primary cardiologist if episodes persisted. Patient states she was told no more rehab until she saw Dr. Antoine Poche. She states this episode lasted about 1.5 hours  She states this AM, around 530, her HR was up to 138bpm and the AF episode lasted 1 hour. She is fine now.   She states she has been cardioverted before, but is still having AF episodes. She would like to finish out cardiac rehab.   Patient c/o tightness in chest, SOB, yawning, and feeling hot during AF episodes.   Will talked with Dr. Antoine Poche about scheduling patient to be seen.

## 2014-02-10 ENCOUNTER — Encounter (HOSPITAL_COMMUNITY): Payer: Medicare Other

## 2014-02-12 ENCOUNTER — Encounter (HOSPITAL_COMMUNITY): Payer: Medicare Other

## 2014-02-15 ENCOUNTER — Encounter (HOSPITAL_COMMUNITY): Payer: Medicare Other

## 2014-02-16 ENCOUNTER — Encounter: Payer: Self-pay | Admitting: Cardiology

## 2014-02-17 ENCOUNTER — Encounter (HOSPITAL_COMMUNITY): Payer: Medicare Other

## 2014-02-18 ENCOUNTER — Encounter: Payer: Self-pay | Admitting: Nurse Practitioner

## 2014-02-18 ENCOUNTER — Ambulatory Visit (INDEPENDENT_AMBULATORY_CARE_PROVIDER_SITE_OTHER): Payer: Medicare Other | Admitting: Nurse Practitioner

## 2014-02-18 VITALS — BP 140/76 | HR 53 | Ht 65.0 in | Wt 278.7 lb

## 2014-02-18 DIAGNOSIS — I480 Paroxysmal atrial fibrillation: Secondary | ICD-10-CM | POA: Diagnosis not present

## 2014-02-18 DIAGNOSIS — I5032 Chronic diastolic (congestive) heart failure: Secondary | ICD-10-CM

## 2014-02-18 NOTE — Progress Notes (Signed)
Cardiology Office Note  Date:  02/18/2014   ID:  [name], DOB 1946/01/16, MRN [id number]  PCP:  Ruthe Mannan, MD  Cardiologist:  Shela Commons. Hochrein, MD   _____________  Chief Complaint  Patient presents with  . Atrial Fibrillation    Patient reports 4 episodes of a-fib in the last 2 weeks, states her chest gets tight, feels hot.   _____________   History of Present Illness: [name] is a 69 y.o. female who presents today for cardiology evaluation.   She has a h/o PAF and is on tikosyn and bb therapy along with OAC with coumadin.  She was evaluated by Dr. Johney Frame for consideration of alternate afib mgmt in 11/2013, and given relatively low burden of afib, it was felt that ongoing therapy with tikosyn and metoprolol was most appropriate.  Wt loss and compliance with CPAP were encouraged.  She has since been partaking in cardiac rehab and has been trying to lose weight.  On several occasions during cardiac rehab, she has had runs of afib, or most recently, a brief run of SVT with aberrancy.  She says that when this occurs, rehab staff gets very excited, which often makes her anxious and upset, as she is accustomed to having brief bursts of afib.  Typically, she experiences an episode of palpitations about 4-6 x/month associated with palpitations and dyspnea, lasting 1-2 minutes, and resolving spontaneously.  These mostly happen @ home and she doesn't think too much of it.  Today, she denies symptoms of palpitations, chest pain, shortness of breath, orthopnea, PND, lower extremity edema, claudication, dizziness, presyncope, syncope, bleeding, or neurologic sequela. The patient is tolerating medications without difficulties and is otherwise without complaint today.  _____________   Past Medical History  Diagnosis Date  . Hyperlipidemia   . Hypertension   . Osteoporosis   . PAF (paroxysmal atrial fibrillation)     a. s/p multiple cardioversions;  b. 2010: on Tikosyn - dose decreased from  to in setting of NSTEMI/QT prolongation. Did not hold NSR. c. admitted 08/2011 with loading at BID with DCCV at that time;  d. Chronic coumadin (CHA2DS2VASc = 5).  [name] CAD (coronary artery disease)     a. 07/2008 NSTEMI ->Cath: LM nl, LAD 70p/90p, 57m, LCX nl, RCA nl, EF 60%.  The LAD was stented with a 2.25x54mm Veri-Flex BMS.  b. 8/15 cath patent stent, nonobstructive disease otherwise - unchanged. Elevated troponin felt due to demand ischemia in setting of AF-RVR.  . Duodenitis   . Headache(784.0)   . Chronic diastolic CHF (congestive heart failure)     a. 07/2008 Echo: EF 60-65%, Triv AI/MR, Mild TR;  b. 09/2013 Echo: EF 60-65%, mild conc LVH.  . Morbid obesity   . Difficult intubation   . History of positive PPD   . Arthritis    Past Surgical History  Procedure Laterality Date  . Cholecystectomy    . Abdominal hysterectomy      partial, bleeding  . Knee arthroscopy  2000 & 2001    left and right  . Esophagogastroduodenoscopy  2003  . Coronary stent placement    . Tubal ligation    . Total knee arthroplasty      bilateral  . Left heart catheterization with coronary angiogram N/A 09/24/2013    Procedure: LEFT HEART CATHETERIZATION WITH CORONARY ANGIOGRAM;  Surgeon: Micheline Chapman, MD;  Location: [facility] CATH LAB;  Service: Cardiovascular;  Laterality: N/A;  _____________  Current Outpatient Prescriptions  Medication Sig Dispense Refill  . acetaminophen (TYLENOL) 500 MG tablet Take 1,000 mg by mouth every 6 (six) hours as needed for headache.    [name] atorvastatin (LIPITOR) 40 MG tablet Take 1 tablet (40 mg total) by mouth daily at 6 PM. 90 tablet 3  . dofetilide (TIKOSYN) 500 MCG capsule Take 1 capsule (500 mcg total) by mouth every 12 (twelve) hours. 180 capsule 6  . furosemide (LASIX) 20 MG tablet Take 1 tablet (20 mg total) by mouth daily. 90 tablet 3  . magnesium oxide (MAG-OX) 400 MG tablet Take 1 tablet (400 mg total) by mouth daily. 90 tablet 3  . metoprolol  (LOPRESSOR) 100 MG tablet Take 1 tablet (100 mg total) by mouth 2 (two) times daily. 60 tablet 6  . nitroGLYCERIN (NITROSTAT) 0.4 MG SL tablet Place 1 tablet (0.4 mg total) under the tongue every 5 (five) minutes x 3 doses as needed for chest pain. 25 tablet 12  . potassium chloride SA (K-DUR,KLOR-CON) 20 MEQ tablet Take 1 tablet (20 mEq total) by mouth daily. 30 tablet 11  . warfarin (COUMADIN) 5 MG tablet Take 0.5 tablets (2.5 mg total) by mouth daily. 90 tablet 3   No current facility-administered medications for this visit.  _____________   Allergies:   Rosuvastatin  _____________   ROS:  Please see the history of present illness.   Positive for occasional palpitations as outlined above.  All other systems are reviewed and negative.  _____________   PHYSICAL EXAM: VS:  BP 140/76 mmHg  Pulse 53  Ht  (1.651 m)  Wt 278 lb 11.2 oz (126.417 kg)  BMI 46.38 kg/m2 , BMI Body mass index is 46.38 kg/(m^2). GEN: Well nourished, well developed, in no acute distress HEENT: normal Neck: Obese - difficult to assess jvp. No carotid bruits, or masses Cardiac: RRR; no murmurs, rubs, or gallops. No clubbing, cyanosis, edema.  Radials/DP/PT 2+ and equal bilaterally.  Respiratory:  clear to auscultation bilaterally, normal work of breathing GI: soft, nontender, nondistended, + BS MS: no deformity or atrophy Skin: warm and dry, no rash Neuro:  Strength and sensation are intact Psych: euthymic mood, full affect _____________  EKG:   The ekg ordered today shows sinus brady, 53, antlat twi (old).  QTc 450 - stable.  Recent Labs: 09/23/2013: TSH 3.270 09/25/2013: Hemoglobin 13.8; Platelets 221 11/23/2013: Magnesium 1.9 11/27/2013: BUN 22; Creatinine 1.2; Potassium 4.2; Sodium 140  09/23/2013: Cholesterol, Total 205*; HDL Cholesterol by NMR 41; LDL (calc) 140*; Total CHOL/HDL Ratio 5.0; Triglycerides 122; VLDL 24  CrCl cannot be calculated (Patient has no serum creatinine result on file.).  Wt  Readings from Last 3 Encounters:  02/18/14 278 lb 11.2 oz (126.417 kg)  12/22/13 277 lb 8 oz (125.873 kg)  12/15/13 278 lb 8 oz (126.327 kg)    _____________   ASSESSMENT AND PLAN:  1.  PAF:  Pt continues to have somewhat frequent - up to 6 x/month, but very brief (1-2 mins) episodes of PAF.  She also recently had a brief asymptomatic run of SVT with aberrancy while @ cardiac rehab.  Overall, she is now used to these brief episodes and they generally don't bother her.  She is more bothered by the response to the episodes by cardiac rehab staff, when they occur during rehab.  She has previously been seen in Afib clinic and it was felt that given relative low burden of afib, that tikosyn therapy should be continued.  She is also on bb and  is in sinus brady today.  We discussed that she could take an additional 50 mg of metoprolol for PAF that lasts longer than her usual few mins.  She asked if she should stop rehab altogether, but considering that lifestyle modifications and wt loss are such an important long term mgmt goal, I rec that she continue is rehab.  I offered to arrange for afib clinic f/u in the future, however she prefers to go along with the current course and felt that afib clinic didn't have much to offer her.  2.  Chronic diastolic CHF:  Stable volume and wt.  Cont bb and low dose lasix.  3.  OSA:  On cpap.  4.  Morbid Obesity:  Ongoing cardiac rehab.  Disposition:   F/U with Dr. Antoine Poche in March as scheduled.  Signed, Nicolasa Ducking, NP 02/18/2014 1:19 PM    _____________ [facility] [address] [place] [address]  [phone] (office) [phone] (fax)

## 2014-02-18 NOTE — Patient Instructions (Signed)
Ward Givens, NP has made no changes today in your current medications or treatment plan.  If you have another episode of atrial fibrillation, you may take and extra half tablet of Metoprolol (totaling 50 mg) as needed.  You have an appointment scheduled to see Dr Antoine Poche on 04/22/2014 at 11:45 a. Please keep this scheduled appointment.

## 2014-02-19 ENCOUNTER — Encounter (HOSPITAL_COMMUNITY)
Admission: RE | Admit: 2014-02-19 | Discharge: 2014-02-19 | Disposition: A | Discharge status: Home / Self Care | Payer: Medicare Other | Source: Ambulatory Visit | Attending: Cardiology | Admitting: Cardiology

## 2014-02-19 DIAGNOSIS — I4891 Unspecified atrial fibrillation: Secondary | ICD-10-CM | POA: Insufficient documentation

## 2014-02-19 DIAGNOSIS — Z6841 Body Mass Index (BMI) 40.0 and over, adult: Secondary | ICD-10-CM | POA: Insufficient documentation

## 2014-02-19 DIAGNOSIS — E785 Hyperlipidemia, unspecified: Secondary | ICD-10-CM | POA: Diagnosis not present

## 2014-02-19 DIAGNOSIS — Z5189 Encounter for other specified aftercare: Secondary | ICD-10-CM | POA: Diagnosis not present

## 2014-02-19 DIAGNOSIS — Z96659 Presence of unspecified artificial knee joint: Secondary | ICD-10-CM | POA: Diagnosis not present

## 2014-02-19 DIAGNOSIS — Z79899 Other long term (current) drug therapy: Secondary | ICD-10-CM | POA: Insufficient documentation

## 2014-02-19 DIAGNOSIS — Z9861 Coronary angioplasty status: Secondary | ICD-10-CM | POA: Diagnosis not present

## 2014-02-19 DIAGNOSIS — I252 Old myocardial infarction: Secondary | ICD-10-CM | POA: Diagnosis not present

## 2014-02-19 DIAGNOSIS — M810 Age-related osteoporosis without current pathological fracture: Secondary | ICD-10-CM | POA: Insufficient documentation

## 2014-02-19 DIAGNOSIS — I10 Essential (primary) hypertension: Secondary | ICD-10-CM | POA: Insufficient documentation

## 2014-02-19 DIAGNOSIS — Z7901 Long term (current) use of anticoagulants: Secondary | ICD-10-CM | POA: Insufficient documentation

## 2014-02-19 DIAGNOSIS — G4733 Obstructive sleep apnea (adult) (pediatric): Secondary | ICD-10-CM | POA: Insufficient documentation

## 2014-02-19 DIAGNOSIS — I5032 Chronic diastolic (congestive) heart failure: Secondary | ICD-10-CM | POA: Insufficient documentation

## 2014-02-19 DIAGNOSIS — I422 Other hypertrophic cardiomyopathy: Secondary | ICD-10-CM | POA: Diagnosis not present

## 2014-02-19 NOTE — Progress Notes (Signed)
Lynniah returned to exercise today at cardiac rehab and exercised without difficulty. Will continue to monitor the patient throughout  the program.

## 2014-02-22 ENCOUNTER — Ambulatory Visit: Payer: Medicare Other | Admitting: Family Medicine

## 2014-02-22 ENCOUNTER — Encounter: Payer: Self-pay | Admitting: Family Medicine

## 2014-02-22 ENCOUNTER — Encounter (HOSPITAL_COMMUNITY): Payer: Medicare Other

## 2014-02-22 ENCOUNTER — Ambulatory Visit (INDEPENDENT_AMBULATORY_CARE_PROVIDER_SITE_OTHER): Payer: Medicare Other | Admitting: Family Medicine

## 2014-02-22 VITALS — BP 118/60 | HR 60 | Temp 98.5°F | Wt 282.5 lb

## 2014-02-22 DIAGNOSIS — J069 Acute upper respiratory infection, unspecified: Secondary | ICD-10-CM | POA: Diagnosis not present

## 2014-02-22 DIAGNOSIS — B9789 Other viral agents as the cause of diseases classified elsewhere: Secondary | ICD-10-CM

## 2014-02-22 MED ORDER — AMOXICILLIN-POT CLAVULANATE 875-125 MG PO TABS: 1.0000 | ORAL_TABLET | Freq: Two times a day (BID) | ORAL | Status: DC

## 2014-02-22 MED ORDER — HYDROCODONE-HOMATROPINE 5-1.5 MG/5ML PO SYRP: 5.0000 mL | ORAL_SOLUTION | Freq: Three times a day (TID) | ORAL | Status: DC | PRN

## 2014-02-22 NOTE — Patient Instructions (Signed)
Good to see you. Please take Augmentin as directed, drink plenty of fluids.  Keep your coumadin appt and alert them you are taking antibiotics.  Cough suppressant as needed at bedtime.

## 2014-02-22 NOTE — Assessment & Plan Note (Signed)
New- Given duration and progression of symptoms, will treat for bacterial process with Augmentin. She is on coumadin for PAF- advised that she keep her INR check appt but call them today to alert them that she is taking an abx. The patient indicates understanding of these issues and agrees with the plan. Hycodan rx given to pt for prn night time cough.  She has taken this in past without issue. Call or return to clinic prn if these symptoms worsen or fail to improve as anticipated. The patient indicates understanding of these issues and agrees with the plan.

## 2014-02-22 NOTE — Progress Notes (Signed)
Subjective:   Patient ID: [name], female    DOB: [birth year], 69 y.o.   MRN: [id number]  [name] is a pleasant 69 y.o. year old female who presents to clinic today with Cough  on 02/22/2014  HPI: Worsening cough and congestion.  1 week of progressive URI symptoms- runny nose, cough, fever, ear pain.  Since then, cough is more productive.  Does have h/o CHF, Afib- does not feel more SOB than usual. No fevers.  Current Outpatient Prescriptions on File Prior to Visit  Medication Sig Dispense Refill  . acetaminophen (TYLENOL) 500 MG tablet Take 1,000 mg by mouth every 6 (six) hours as needed for headache.    [name] atorvastatin (LIPITOR) 40 MG tablet Take 1 tablet (40 mg total) by mouth daily at 6 PM. 90 tablet 3  . dofetilide (TIKOSYN) 500 MCG capsule Take 1 capsule (500 mcg total) by mouth every 12 (twelve) hours. 180 capsule 6  . furosemide (LASIX) 20 MG tablet Take 1 tablet (20 mg total) by mouth daily. 90 tablet 3  . magnesium oxide (MAG-OX) 400 MG tablet Take 1 tablet (400 mg total) by mouth daily. 90 tablet 3  . metoprolol (LOPRESSOR) 100 MG tablet Take 1 tablet (100 mg total) by mouth 2 (two) times daily. 60 tablet 6  . nitroGLYCERIN (NITROSTAT) 0.4 MG SL tablet Place 1 tablet (0.4 mg total) under the tongue every 5 (five) minutes x 3 doses as needed for chest pain. 25 tablet 12  . potassium chloride SA (K-DUR,KLOR-CON) 20 MEQ tablet Take 1 tablet (20 mEq total) by mouth daily. 30 tablet 11  . warfarin (COUMADIN) 5 MG tablet Take 0.5 tablets (2.5 mg total) by mouth daily. 90 tablet 3   No current facility-administered medications on file prior to visit.    Allergies  Allergen Reactions  . Rosuvastatin Other (See Comments)    REACTION: muscle and joint pain    Past Medical History  Diagnosis Date  . Hyperlipidemia   . Hypertension   . Osteoporosis   . PAF (paroxysmal atrial fibrillation)     a. s/p multiple cardioversions;  b. 2010: on Tikosyn - dose decreased  from to in setting of NSTEMI/QT prolongation. Did not hold NSR. c. admitted 08/2011 with loading at BID with DCCV at that time;  d. Chronic coumadin (CHA2DS2VASc = 5).  [name] CAD (coronary artery disease)     a. 07/2008 NSTEMI ->Cath: LM nl, LAD 70p/90p, 69m, LCX nl, RCA nl, EF 60%.  The LAD was stented with a 2.25x68mm Veri-Flex BMS.  b. 8/15 cath patent stent, nonobstructive disease otherwise - unchanged. Elevated troponin felt due to demand ischemia in setting of AF-RVR.  . Duodenitis   . Headache(784.0)   . Chronic diastolic CHF (congestive heart failure)     a. 07/2008 Echo: EF 60-65%, Triv AI/MR, Mild TR;  b. 09/2013 Echo: EF 60-65%, mild conc LVH.  . Morbid obesity   . Difficult intubation   . History of positive PPD   . Arthritis     Past Surgical History  Procedure Laterality Date  . Cholecystectomy    . Abdominal hysterectomy      partial, bleeding  . Knee arthroscopy  2000 & 2001    left and right  . Esophagogastroduodenoscopy  2003  . Coronary stent placement    . Tubal ligation    . Total knee arthroplasty      bilateral  . Left heart catheterization with coronary angiogram N/A  09/24/2013    Procedure: LEFT HEART CATHETERIZATION WITH CORONARY ANGIOGRAM;  Surgeon: Micheline Chapman, MD;  Location: [facility] CATH LAB;  Service: Cardiovascular;  Laterality: N/A;    Family History  Problem Relation Age of Onset  . Other Mother     GI Bleed  . Pneumonia Father   . Coronary artery disease Brother   . Breast cancer Paternal Grandmother   . Rectal cancer Paternal Grandfather     History   Social History  . Marital Status: Married    Spouse Name: N/A    Number of Children: 2  . Years of Education: N/A   Occupational History  . retired    Social History Main Topics  . Smoking status: Never Smoker   . Smokeless tobacco: Never Used  . Alcohol Use: No  . Drug Use: No  . Sexual Activity: Not Currently    Birth Control/ Protection: Post-menopausal   Other  Topics Concern  . Not on file   Social History Narrative   The PMH, PSH, Social History, Family History, Medications, and allergies have been reviewed in [facility], and have been updated if relevant.   Review of Systems See HPI +intermittent SOB No CP No rashes No new LE edema   +cough + fever- recently took Tylenol No HA No n/v/d No abdominal pain No nose bleeds No blood in urine or blood in her stool. Objective:    BP 118/60 mmHg  Pulse 60  Temp(Src) 98.5 F (36.9 C) (Oral)  Wt 282 lb 8 oz (128.141 kg)  SpO2 97%   Physical Exam  Constitutional: She appears well-developed and well-nourished. No distress.  HENT:  Head: Normocephalic.  Right Ear: Tympanic membrane is injected.  Left Ear: Tympanic membrane is not injected.  Nose: Rhinorrhea present. No sinus tenderness. Right sinus exhibits no maxillary sinus tenderness and no frontal sinus tenderness. Left sinus exhibits no maxillary sinus tenderness and no frontal sinus tenderness.  Eyes: Conjunctivae are normal.  Neck: Normal range of motion.  Cardiovascular: Normal rate and regular rhythm.   Pulmonary/Chest: Effort normal. No respiratory distress. She has wheezes. She has no rales. She exhibits no tenderness.  Scattered exp wheezes, good air movement  Skin: Skin is warm and dry.  Psychiatric: She has a normal mood and affect. Her behavior is normal. Judgment and thought content normal.       Assessment & Plan:   No diagnosis found. No Follow-up on file.

## 2014-02-22 NOTE — Progress Notes (Signed)
Pre visit review using our clinic review tool, if applicable. No additional management support is needed unless otherwise documented below in the visit note. 

## 2014-02-24 ENCOUNTER — Encounter (HOSPITAL_COMMUNITY)
Admission: RE | Admit: 2014-02-24 | Discharge: 2014-02-24 | Disposition: A | Discharge status: Home / Self Care | Payer: Medicare Other | Source: Ambulatory Visit | Attending: Cardiology | Admitting: Cardiology

## 2014-02-24 DIAGNOSIS — Z6841 Body Mass Index (BMI) 40.0 and over, adult: Secondary | ICD-10-CM | POA: Diagnosis not present

## 2014-02-24 DIAGNOSIS — I252 Old myocardial infarction: Secondary | ICD-10-CM | POA: Diagnosis not present

## 2014-02-24 DIAGNOSIS — I5032 Chronic diastolic (congestive) heart failure: Secondary | ICD-10-CM | POA: Diagnosis not present

## 2014-02-24 DIAGNOSIS — Z5189 Encounter for other specified aftercare: Secondary | ICD-10-CM | POA: Diagnosis not present

## 2014-02-24 DIAGNOSIS — Z9861 Coronary angioplasty status: Secondary | ICD-10-CM | POA: Diagnosis not present

## 2014-02-26 ENCOUNTER — Encounter (HOSPITAL_COMMUNITY): Payer: Medicare Other

## 2014-03-01 ENCOUNTER — Encounter (HOSPITAL_COMMUNITY): Payer: Medicare Other

## 2014-03-03 ENCOUNTER — Encounter (HOSPITAL_COMMUNITY)
Admission: RE | Admit: 2014-03-03 | Discharge: 2014-03-03 | Disposition: A | Discharge status: Home / Self Care | Payer: Medicare Other | Source: Ambulatory Visit | Attending: Cardiology | Admitting: Cardiology

## 2014-03-03 DIAGNOSIS — Z9861 Coronary angioplasty status: Secondary | ICD-10-CM | POA: Diagnosis not present

## 2014-03-03 DIAGNOSIS — I252 Old myocardial infarction: Secondary | ICD-10-CM | POA: Diagnosis not present

## 2014-03-03 DIAGNOSIS — Z6841 Body Mass Index (BMI) 40.0 and over, adult: Secondary | ICD-10-CM | POA: Diagnosis not present

## 2014-03-03 DIAGNOSIS — Z5189 Encounter for other specified aftercare: Secondary | ICD-10-CM | POA: Diagnosis not present

## 2014-03-03 DIAGNOSIS — I5032 Chronic diastolic (congestive) heart failure: Secondary | ICD-10-CM | POA: Diagnosis not present

## 2014-03-05 ENCOUNTER — Encounter (HOSPITAL_COMMUNITY)
Admission: RE | Admit: 2014-03-05 | Discharge: 2014-03-05 | Disposition: A | Discharge status: Home / Self Care | Payer: Medicare Other | Source: Ambulatory Visit | Attending: Cardiology | Admitting: Cardiology

## 2014-03-05 DIAGNOSIS — I5032 Chronic diastolic (congestive) heart failure: Secondary | ICD-10-CM | POA: Diagnosis not present

## 2014-03-05 DIAGNOSIS — Z9861 Coronary angioplasty status: Secondary | ICD-10-CM | POA: Diagnosis not present

## 2014-03-05 DIAGNOSIS — Z6841 Body Mass Index (BMI) 40.0 and over, adult: Secondary | ICD-10-CM | POA: Diagnosis not present

## 2014-03-05 DIAGNOSIS — Z5189 Encounter for other specified aftercare: Secondary | ICD-10-CM | POA: Diagnosis not present

## 2014-03-05 DIAGNOSIS — I252 Old myocardial infarction: Secondary | ICD-10-CM | POA: Diagnosis not present

## 2014-03-08 ENCOUNTER — Ambulatory Visit (INDEPENDENT_AMBULATORY_CARE_PROVIDER_SITE_OTHER): Payer: Medicare Other

## 2014-03-08 ENCOUNTER — Encounter (HOSPITAL_COMMUNITY)
Admission: RE | Admit: 2014-03-08 | Discharge: 2014-03-08 | Disposition: A | Discharge status: Home / Self Care | Payer: Medicare Other | Source: Ambulatory Visit | Attending: Cardiology | Admitting: Cardiology

## 2014-03-08 DIAGNOSIS — I252 Old myocardial infarction: Secondary | ICD-10-CM | POA: Insufficient documentation

## 2014-03-08 DIAGNOSIS — Z9861 Coronary angioplasty status: Secondary | ICD-10-CM | POA: Diagnosis not present

## 2014-03-08 DIAGNOSIS — M810 Age-related osteoporosis without current pathological fracture: Secondary | ICD-10-CM | POA: Diagnosis not present

## 2014-03-08 DIAGNOSIS — Z6841 Body Mass Index (BMI) 40.0 and over, adult: Secondary | ICD-10-CM | POA: Diagnosis not present

## 2014-03-08 DIAGNOSIS — G4733 Obstructive sleep apnea (adult) (pediatric): Secondary | ICD-10-CM | POA: Insufficient documentation

## 2014-03-08 DIAGNOSIS — Z79899 Other long term (current) drug therapy: Secondary | ICD-10-CM | POA: Diagnosis not present

## 2014-03-08 DIAGNOSIS — I480 Paroxysmal atrial fibrillation: Secondary | ICD-10-CM

## 2014-03-08 DIAGNOSIS — Z5189 Encounter for other specified aftercare: Secondary | ICD-10-CM | POA: Diagnosis not present

## 2014-03-08 DIAGNOSIS — I4891 Unspecified atrial fibrillation: Secondary | ICD-10-CM | POA: Diagnosis not present

## 2014-03-08 DIAGNOSIS — I5032 Chronic diastolic (congestive) heart failure: Secondary | ICD-10-CM | POA: Diagnosis not present

## 2014-03-08 DIAGNOSIS — I422 Other hypertrophic cardiomyopathy: Secondary | ICD-10-CM | POA: Diagnosis not present

## 2014-03-08 DIAGNOSIS — Z96659 Presence of unspecified artificial knee joint: Secondary | ICD-10-CM | POA: Insufficient documentation

## 2014-03-08 DIAGNOSIS — Z5181 Encounter for therapeutic drug level monitoring: Secondary | ICD-10-CM

## 2014-03-08 DIAGNOSIS — I10 Essential (primary) hypertension: Secondary | ICD-10-CM | POA: Insufficient documentation

## 2014-03-08 DIAGNOSIS — Z7901 Long term (current) use of anticoagulants: Secondary | ICD-10-CM | POA: Diagnosis not present

## 2014-03-08 DIAGNOSIS — E785 Hyperlipidemia, unspecified: Secondary | ICD-10-CM | POA: Insufficient documentation

## 2014-03-08 LAB — POCT INR: INR: 2.6

## 2014-03-10 ENCOUNTER — Encounter (HOSPITAL_COMMUNITY)
Admission: RE | Admit: 2014-03-10 | Discharge: 2014-03-10 | Disposition: A | Discharge status: Home / Self Care | Payer: Medicare Other | Source: Ambulatory Visit | Attending: Cardiology | Admitting: Cardiology

## 2014-03-10 DIAGNOSIS — I5032 Chronic diastolic (congestive) heart failure: Secondary | ICD-10-CM | POA: Diagnosis not present

## 2014-03-10 DIAGNOSIS — Z5189 Encounter for other specified aftercare: Secondary | ICD-10-CM | POA: Diagnosis not present

## 2014-03-10 DIAGNOSIS — I252 Old myocardial infarction: Secondary | ICD-10-CM | POA: Diagnosis not present

## 2014-03-10 DIAGNOSIS — Z9861 Coronary angioplasty status: Secondary | ICD-10-CM | POA: Diagnosis not present

## 2014-03-10 DIAGNOSIS — Z6841 Body Mass Index (BMI) 40.0 and over, adult: Secondary | ICD-10-CM | POA: Diagnosis not present

## 2014-03-12 ENCOUNTER — Encounter (HOSPITAL_COMMUNITY)
Admission: RE | Admit: 2014-03-12 | Discharge: 2014-03-12 | Disposition: A | Discharge status: Home / Self Care | Payer: Medicare Other | Source: Ambulatory Visit | Attending: Cardiology | Admitting: Cardiology

## 2014-03-12 DIAGNOSIS — I252 Old myocardial infarction: Secondary | ICD-10-CM | POA: Diagnosis not present

## 2014-03-12 DIAGNOSIS — I5032 Chronic diastolic (congestive) heart failure: Secondary | ICD-10-CM | POA: Diagnosis not present

## 2014-03-12 DIAGNOSIS — Z5189 Encounter for other specified aftercare: Secondary | ICD-10-CM | POA: Diagnosis not present

## 2014-03-12 DIAGNOSIS — Z9861 Coronary angioplasty status: Secondary | ICD-10-CM | POA: Diagnosis not present

## 2014-03-12 DIAGNOSIS — Z6841 Body Mass Index (BMI) 40.0 and over, adult: Secondary | ICD-10-CM | POA: Diagnosis not present

## 2014-03-15 ENCOUNTER — Encounter (HOSPITAL_COMMUNITY)
Admission: RE | Admit: 2014-03-15 | Discharge: 2014-03-15 | Disposition: A | Discharge status: Home / Self Care | Payer: Medicare Other | Source: Ambulatory Visit | Attending: Cardiology | Admitting: Cardiology

## 2014-03-15 DIAGNOSIS — I5032 Chronic diastolic (congestive) heart failure: Secondary | ICD-10-CM | POA: Diagnosis not present

## 2014-03-15 DIAGNOSIS — I252 Old myocardial infarction: Secondary | ICD-10-CM | POA: Diagnosis not present

## 2014-03-15 DIAGNOSIS — Z5189 Encounter for other specified aftercare: Secondary | ICD-10-CM | POA: Diagnosis not present

## 2014-03-15 DIAGNOSIS — Z9861 Coronary angioplasty status: Secondary | ICD-10-CM | POA: Diagnosis not present

## 2014-03-15 DIAGNOSIS — Z6841 Body Mass Index (BMI) 40.0 and over, adult: Secondary | ICD-10-CM | POA: Diagnosis not present

## 2014-03-17 ENCOUNTER — Encounter (HOSPITAL_COMMUNITY)
Admission: RE | Admit: 2014-03-17 | Discharge: 2014-03-17 | Disposition: A | Discharge status: Home / Self Care | Payer: Medicare Other | Source: Ambulatory Visit | Attending: Cardiology | Admitting: Cardiology

## 2014-03-17 DIAGNOSIS — Z9861 Coronary angioplasty status: Secondary | ICD-10-CM | POA: Diagnosis not present

## 2014-03-17 DIAGNOSIS — Z6841 Body Mass Index (BMI) 40.0 and over, adult: Secondary | ICD-10-CM | POA: Diagnosis not present

## 2014-03-17 DIAGNOSIS — Z5189 Encounter for other specified aftercare: Secondary | ICD-10-CM | POA: Diagnosis not present

## 2014-03-17 DIAGNOSIS — I5032 Chronic diastolic (congestive) heart failure: Secondary | ICD-10-CM | POA: Diagnosis not present

## 2014-03-17 DIAGNOSIS — I252 Old myocardial infarction: Secondary | ICD-10-CM | POA: Diagnosis not present

## 2014-03-19 ENCOUNTER — Encounter (HOSPITAL_COMMUNITY)
Admission: RE | Admit: 2014-03-19 | Discharge: 2014-03-19 | Disposition: A | Discharge status: Home / Self Care | Payer: Medicare Other | Source: Ambulatory Visit | Attending: Cardiology | Admitting: Cardiology

## 2014-03-19 DIAGNOSIS — Z6841 Body Mass Index (BMI) 40.0 and over, adult: Secondary | ICD-10-CM | POA: Diagnosis not present

## 2014-03-19 DIAGNOSIS — I252 Old myocardial infarction: Secondary | ICD-10-CM | POA: Diagnosis not present

## 2014-03-19 DIAGNOSIS — Z5189 Encounter for other specified aftercare: Secondary | ICD-10-CM | POA: Diagnosis not present

## 2014-03-19 DIAGNOSIS — Z9861 Coronary angioplasty status: Secondary | ICD-10-CM | POA: Diagnosis not present

## 2014-03-19 DIAGNOSIS — I5032 Chronic diastolic (congestive) heart failure: Secondary | ICD-10-CM | POA: Diagnosis not present

## 2014-03-19 NOTE — Progress Notes (Signed)
[place] graduates today. [name] was encouraged to walk on her own. PHQ =0. [name] says she feels better emotionally since participating in cardiac rehab. [name] says her goals have been met to reduce shortness of breath and reduce stress.

## 2014-04-06 ENCOUNTER — Ambulatory Visit (INDEPENDENT_AMBULATORY_CARE_PROVIDER_SITE_OTHER): Payer: Medicare Other | Admitting: Pharmacist

## 2014-04-06 DIAGNOSIS — Z5181 Encounter for therapeutic drug level monitoring: Secondary | ICD-10-CM | POA: Diagnosis not present

## 2014-04-06 DIAGNOSIS — I480 Paroxysmal atrial fibrillation: Secondary | ICD-10-CM

## 2014-04-06 DIAGNOSIS — I4891 Unspecified atrial fibrillation: Secondary | ICD-10-CM

## 2014-04-06 DIAGNOSIS — Z7901 Long term (current) use of anticoagulants: Secondary | ICD-10-CM | POA: Diagnosis not present

## 2014-04-06 LAB — POCT INR: INR: 2.4

## 2014-04-21 ENCOUNTER — Other Ambulatory Visit: Payer: Self-pay | Admitting: *Deleted

## 2014-04-21 ENCOUNTER — Other Ambulatory Visit: Payer: Self-pay

## 2014-04-21 DIAGNOSIS — I5032 Chronic diastolic (congestive) heart failure: Secondary | ICD-10-CM

## 2014-04-21 MED ORDER — FUROSEMIDE 20 MG PO TABS: 20.0000 mg | ORAL_TABLET | Freq: Every day | ORAL | Status: DC

## 2014-04-21 MED ORDER — ATORVASTATIN CALCIUM 40 MG PO TABS: 40.0000 mg | ORAL_TABLET | Freq: Every day | ORAL | Status: DC

## 2014-04-21 MED ORDER — WARFARIN SODIUM 5 MG PO TABS: 5.0000 mg | ORAL_TABLET | ORAL | Status: DC

## 2014-04-21 MED ORDER — DOFETILIDE 500 MCG PO CAPS: 500.0000 ug | ORAL_CAPSULE | Freq: Two times a day (BID) | ORAL | Status: DC

## 2014-04-21 MED ORDER — METOPROLOL TARTRATE 100 MG PO TABS: 100.0000 mg | ORAL_TABLET | Freq: Two times a day (BID) | ORAL | Status: DC

## 2014-04-21 MED ORDER — POTASSIUM CHLORIDE CRYS ER 20 MEQ PO TBCR: 20.0000 meq | EXTENDED_RELEASE_TABLET | Freq: Every day | ORAL | Status: DC

## 2014-04-22 ENCOUNTER — Other Ambulatory Visit: Payer: Self-pay | Admitting: *Deleted

## 2014-04-22 ENCOUNTER — Ambulatory Visit (INDEPENDENT_AMBULATORY_CARE_PROVIDER_SITE_OTHER): Payer: Medicare Other | Admitting: Cardiology

## 2014-04-22 ENCOUNTER — Encounter: Payer: Self-pay | Admitting: Cardiology

## 2014-04-22 VITALS — BP 144/72 | HR 57 | Ht 65.0 in | Wt 284.0 lb

## 2014-04-22 DIAGNOSIS — E878 Other disorders of electrolyte and fluid balance, not elsewhere classified: Secondary | ICD-10-CM | POA: Diagnosis not present

## 2014-04-22 DIAGNOSIS — R0600 Dyspnea, unspecified: Secondary | ICD-10-CM

## 2014-04-22 DIAGNOSIS — I480 Paroxysmal atrial fibrillation: Secondary | ICD-10-CM

## 2014-04-22 DIAGNOSIS — R0602 Shortness of breath: Secondary | ICD-10-CM | POA: Diagnosis not present

## 2014-04-22 DIAGNOSIS — R252 Cramp and spasm: Secondary | ICD-10-CM

## 2014-04-22 DIAGNOSIS — I10 Essential (primary) hypertension: Secondary | ICD-10-CM | POA: Diagnosis not present

## 2014-04-22 DIAGNOSIS — I5032 Chronic diastolic (congestive) heart failure: Secondary | ICD-10-CM

## 2014-04-22 LAB — CBC
HCT: 41.3 % (ref 36.0–46.0)
Hemoglobin: 13.8 g/dL (ref 12.0–15.0)
MCH: 27.5 pg (ref 26.0–34.0)
MCHC: 33.4 g/dL (ref 30.0–36.0)
MCV: 82.3 fL (ref 78.0–100.0)
MPV: 10.8 fL (ref 8.6–12.4)
PLATELETS: 251 10*3/uL (ref 150–400)
RBC: 5.02 MIL/uL (ref 3.87–5.11)
RDW: 14.5 % (ref 11.5–15.5)
WBC: 9 10*3/uL (ref 4.0–10.5)

## 2014-04-22 LAB — MAGNESIUM: Magnesium: 1.7 mg/dL (ref 1.5–2.5)

## 2014-04-22 LAB — BASIC METABOLIC PANEL
BUN: 18 mg/dL (ref 6–23)
CO2: 26 meq/L (ref 19–32)
Calcium: 9.7 mg/dL (ref 8.4–10.5)
Chloride: 101 mEq/L (ref 96–112)
Creat: 1.15 mg/dL — ABNORMAL HIGH (ref 0.50–1.10)
Glucose, Bld: 94 mg/dL (ref 70–99)
Potassium: 4.4 mEq/L (ref 3.5–5.3)
SODIUM: 137 meq/L (ref 135–145)

## 2014-04-22 MED ORDER — METOPROLOL TARTRATE 100 MG PO TABS: 100.0000 mg | ORAL_TABLET | Freq: Two times a day (BID) | ORAL | Status: DC

## 2014-04-22 MED ORDER — WARFARIN SODIUM 5 MG PO TABS: 5.0000 mg | ORAL_TABLET | ORAL | Status: DC

## 2014-04-22 MED ORDER — DOFETILIDE 500 MCG PO CAPS: 500.0000 ug | ORAL_CAPSULE | Freq: Two times a day (BID) | ORAL | Status: DC

## 2014-04-22 MED ORDER — POTASSIUM CHLORIDE CRYS ER 20 MEQ PO TBCR: 20.0000 meq | EXTENDED_RELEASE_TABLET | Freq: Every day | ORAL | Status: DC

## 2014-04-22 MED ORDER — MAGNESIUM OXIDE 400 MG PO TABS: 400.0000 mg | ORAL_TABLET | Freq: Every day | ORAL | Status: DC

## 2014-04-22 MED ORDER — FUROSEMIDE 20 MG PO TABS: 20.0000 mg | ORAL_TABLET | Freq: Every day | ORAL | Status: DC

## 2014-04-22 NOTE — Progress Notes (Signed)
HPI The patient presents for followup of her diastolic dysfunction, coronary disease and atrial fibrillation.   She has had echocardiography demonstrating a hyperdynamic left ventricle with some apical hypertrophy. Most recent cath demonstrated nonobstructive plaque in August 2015. She had a stent in her LAD which was patent. She was seen by one of her NPs for palpitations.  She has not had any documented sustained atrial fibrillation. She is having increasing dyspnea with activities. She is not having PND or orthopnea. She does get some occasional palpitations but these are short lived. Mostly bothered by chronic pain in her legs and some joint pain limits her activities. She wonders if this could be related to Lipitor.    Allergies  Allergen Reactions  . Rosuvastatin Other (See Comments)    REACTION: muscle and joint pain    Current Outpatient Prescriptions  Medication Sig Dispense Refill  . acetaminophen (TYLENOL) 500 MG tablet Take 1,000 mg by mouth every 6 (six) hours as needed for headache.    [name] atorvastatin (LIPITOR) 40 MG tablet Take 1 tablet (40 mg total) by mouth daily at 6 PM. 90 tablet 1  . dofetilide (TIKOSYN) 500 MCG capsule Take 1 capsule (500 mcg total) by mouth every 12 (twelve) hours. 180 capsule 0  . furosemide (LASIX) 20 MG tablet Take 1 tablet (20 mg total) by mouth daily. 90 tablet 1  . magnesium oxide (MAG-OX) 400 MG tablet Take 1 tablet (400 mg total) by mouth daily. 90 tablet 3  . metoprolol (LOPRESSOR) 100 MG tablet Take 1 tablet (100 mg total) by mouth 2 (two) times daily. 60 tablet 1  . nitroGLYCERIN (NITROSTAT) 0.4 MG SL tablet Place 1 tablet (0.4 mg total) under the tongue every 5 (five) minutes x 3 doses as needed for chest pain. 25 tablet 12  . potassium chloride SA (K-DUR,KLOR-CON) 20 MEQ tablet Take 1 tablet (20 mEq total) by mouth daily. 30 tablet 1  . warfarin (COUMADIN) 5 MG tablet Take 1 tablet (5 mg total) by mouth as directed. 90 tablet 0   No  current facility-administered medications for this visit.    Past Medical History  Diagnosis Date  . Hyperlipidemia   . Hypertension   . Osteoporosis   . PAF (paroxysmal atrial fibrillation)     a. s/p multiple cardioversions;  b. 2010: on Tikosyn - dose decreased from to in setting of NSTEMI/QT prolongation. Did not hold NSR. c. admitted 08/2011 with loading at BID with DCCV at that time;  d. Chronic coumadin (CHA2DS2VASc = 5).  [name] CAD (coronary artery disease)     a. 07/2008 NSTEMI ->Cath: LM nl, LAD 70p/90p, 104m, LCX nl, RCA nl, EF 60%.  The LAD was stented with a 2.25x43mm Veri-Flex BMS.  b. 8/15 cath patent stent, nonobstructive disease otherwise - unchanged. Elevated troponin felt due to demand ischemia in setting of AF-RVR.  . Duodenitis   . Headache(784.0)   . Chronic diastolic CHF (congestive heart failure)     a. 07/2008 Echo: EF 60-65%, Triv AI/MR, Mild TR;  b. 09/2013 Echo: EF 60-65%, mild conc LVH.  . Morbid obesity   . Difficult intubation   . History of positive PPD   . Arthritis     Past Surgical History  Procedure Laterality Date  . Cholecystectomy    . Abdominal hysterectomy      partial, bleeding  . Knee arthroscopy  2000 & 2001    left and right  . Esophagogastroduodenoscopy  2003  .  Coronary stent placement    . Tubal ligation    . Total knee arthroplasty      bilateral  . Left heart catheterization with coronary angiogram N/A 09/24/2013    Procedure: LEFT HEART CATHETERIZATION WITH CORONARY ANGIOGRAM;  Surgeon: Micheline Chapman, MD;  Location: [facility] CATH LAB;  Service: Cardiovascular;  Laterality: N/A;   ROS:   As stated in the HPI and negative for all other systems.   PHYSICAL EXAM BP 144/72 mmHg  Pulse 57  Ht 5\' 5"  (1.651 m)  Wt 284 lb (128.822 kg)  BMI 47.26 kg/m2 GENERAL:  Well appearing HEENT:  Pupils equal round and reactive, fundi not visualized, oral mucosa unremarkable NECK:  No jugular venous distention, waveform within  normal limits, carotid upstroke brisk and symmetric, no bruits, no thyromegaly LUNGS:  Clear to auscultation bilaterally CHEST:  Unremarkable HEART:  PMI not displaced or sustained,S1 and S2 within normal limits, no S3, no clicks, no rubs, 2/6 apical and right upper sternal border systolic murmur, no diastolic murmurs. ABD:  Flat, positive bowel sounds normal in frequency in pitch, no bruits, no rebound, no guarding, no midline pulsatile mass, no hepatomegaly, no splenomegaly, obese EXT:  2 plus pulses throughout, no edema, no cyanosis no clubbing  EKG:  Sinus rhythm, rate 57, axis within normal limits, QTC is stable , anterolateral T-wave inversions unchanged from previous.  04/22/2014  ASSESSMENT AND PLAN  Dyspnea I suspect this is multifactorial but I will be checking a BMP as described below.  Atrial Fibrillation  She will remain on the Tikosyn and anticoagulation. No change in therapy is planned.  I will check a CBC.   CAD  She has nonobstructive disease as described on the recent past. She will continue with risk reduction.  Chronic Diastolic CHF  I suspect that she is euvolemic. However, with her dyspnea I will check a BNP level. If this is elevated she might need an adjustment to her diuretics.  Hypertension  The blood pressure is at target. No change in medications is indicated. We will continue with therapeutic lifestyle changes (TLC).  Obesity The patient understands the need to lose weight with diet and exercise. We have discussed specific strategies for this in the past.   Leg Pain I think is reasonable to give her a month off of Lipitor to see if this improves. She will let me know.

## 2014-04-22 NOTE — Patient Instructions (Addendum)
Your physician recommends that you schedule a follow-up appointment in: one year with Dr. Antoine Poche  Have your labs done as soon as you can         BMP BNP MG CBC  Stop taking your Lipitor for one month

## 2014-04-23 ENCOUNTER — Other Ambulatory Visit: Payer: Self-pay | Admitting: *Deleted

## 2014-04-23 DIAGNOSIS — I5032 Chronic diastolic (congestive) heart failure: Secondary | ICD-10-CM

## 2014-04-23 LAB — BRAIN NATRIURETIC PEPTIDE: Brain Natriuretic Peptide: 271.6 pg/mL — ABNORMAL HIGH (ref 0.0–100.0)

## 2014-04-23 MED ORDER — METOPROLOL TARTRATE 100 MG PO TABS: 100.0000 mg | ORAL_TABLET | Freq: Two times a day (BID) | ORAL | Status: DC

## 2014-04-23 MED ORDER — POTASSIUM CHLORIDE CRYS ER 20 MEQ PO TBCR: 20.0000 meq | EXTENDED_RELEASE_TABLET | Freq: Every day | ORAL | Status: DC

## 2014-04-23 NOTE — Telephone Encounter (Signed)
Rx refill sent to patient pharmacy   

## 2014-04-26 ENCOUNTER — Telehealth: Payer: Self-pay | Admitting: *Deleted

## 2014-04-26 MED ORDER — FUROSEMIDE 40 MG PO TABS: 40.0000 mg | ORAL_TABLET | Freq: Every day | ORAL | Status: DC

## 2014-04-26 NOTE — Telephone Encounter (Signed)
ERX sent for increased dose of lasix

## 2014-04-26 NOTE — Telephone Encounter (Signed)
-----   Message from Rollene Rotunda, MD sent at 04/26/2014 11:21 AM EDT ----- Please have her take an extra 20 mg of Lasix daily (new dose should be 40 mg daily).  If this does not help her breathing she can go back to 20 mg daily after one week.  Her BNP is slightly increased.  Call Ms. Berkland with the results and send results to Ruthe Mannan, MD

## 2014-04-27 ENCOUNTER — Telehealth: Payer: Self-pay | Admitting: *Deleted

## 2014-04-27 DIAGNOSIS — I5032 Chronic diastolic (congestive) heart failure: Secondary | ICD-10-CM

## 2014-04-27 MED ORDER — POTASSIUM CHLORIDE CRYS ER 10 MEQ PO TBCR: 10.0000 meq | EXTENDED_RELEASE_TABLET | Freq: Two times a day (BID) | ORAL | Status: DC

## 2014-04-27 NOTE — Telephone Encounter (Signed)
See lab result for complete details.

## 2014-04-27 NOTE — Telephone Encounter (Signed)
[facility] is needing clarification on potassium script for klor-con. klor-con does not come in a 20 meq tablet. New script for klor-con 10 meq one tablet twice daily sent to [facility].

## 2014-04-30 ENCOUNTER — Other Ambulatory Visit: Payer: Self-pay | Admitting: *Deleted

## 2014-04-30 DIAGNOSIS — I5032 Chronic diastolic (congestive) heart failure: Secondary | ICD-10-CM

## 2014-04-30 MED ORDER — POTASSIUM CHLORIDE CRYS ER 10 MEQ PO TBCR: 10.0000 meq | EXTENDED_RELEASE_TABLET | Freq: Two times a day (BID) | ORAL | Status: DC

## 2014-04-30 NOTE — Telephone Encounter (Signed)
Rx refill sent to patient pharmacy   

## 2014-05-18 ENCOUNTER — Ambulatory Visit (INDEPENDENT_AMBULATORY_CARE_PROVIDER_SITE_OTHER): Payer: Medicare Other

## 2014-05-18 DIAGNOSIS — Z7901 Long term (current) use of anticoagulants: Secondary | ICD-10-CM | POA: Diagnosis not present

## 2014-05-18 DIAGNOSIS — Z5181 Encounter for therapeutic drug level monitoring: Secondary | ICD-10-CM

## 2014-05-18 DIAGNOSIS — I4891 Unspecified atrial fibrillation: Secondary | ICD-10-CM

## 2014-05-18 DIAGNOSIS — I480 Paroxysmal atrial fibrillation: Secondary | ICD-10-CM

## 2014-05-18 LAB — POCT INR: INR: 2.8

## 2014-05-19 DIAGNOSIS — H26491 Other secondary cataract, right eye: Secondary | ICD-10-CM | POA: Diagnosis not present

## 2014-05-19 DIAGNOSIS — H40011 Open angle with borderline findings, low risk, right eye: Secondary | ICD-10-CM | POA: Diagnosis not present

## 2014-06-25 ENCOUNTER — Emergency Department (HOSPITAL_COMMUNITY): Payer: Medicare Other

## 2014-06-25 ENCOUNTER — Ambulatory Visit: Payer: No Typology Code available for payment source | Admitting: Cardiology

## 2014-06-25 ENCOUNTER — Encounter (HOSPITAL_COMMUNITY): Payer: Self-pay | Admitting: Family Medicine

## 2014-06-25 ENCOUNTER — Observation Stay (HOSPITAL_COMMUNITY)
Admission: EM | Admit: 2014-06-25 | Discharge: 2014-06-26 | Disposition: A | Discharge status: Home / Self Care | Payer: Medicare Other | Attending: Internal Medicine | Admitting: Internal Medicine

## 2014-06-25 ENCOUNTER — Telehealth: Payer: Self-pay | Admitting: Cardiology

## 2014-06-25 DIAGNOSIS — I5032 Chronic diastolic (congestive) heart failure: Secondary | ICD-10-CM | POA: Diagnosis not present

## 2014-06-25 DIAGNOSIS — I10 Essential (primary) hypertension: Secondary | ICD-10-CM | POA: Diagnosis not present

## 2014-06-25 DIAGNOSIS — R7989 Other specified abnormal findings of blood chemistry: Secondary | ICD-10-CM

## 2014-06-25 DIAGNOSIS — Z7901 Long term (current) use of anticoagulants: Secondary | ICD-10-CM | POA: Insufficient documentation

## 2014-06-25 DIAGNOSIS — I214 Non-ST elevation (NSTEMI) myocardial infarction: Secondary | ICD-10-CM | POA: Diagnosis not present

## 2014-06-25 DIAGNOSIS — M810 Age-related osteoporosis without current pathological fracture: Secondary | ICD-10-CM | POA: Diagnosis not present

## 2014-06-25 DIAGNOSIS — I4891 Unspecified atrial fibrillation: Secondary | ICD-10-CM | POA: Diagnosis present

## 2014-06-25 DIAGNOSIS — Z9861 Coronary angioplasty status: Secondary | ICD-10-CM | POA: Insufficient documentation

## 2014-06-25 DIAGNOSIS — G4733 Obstructive sleep apnea (adult) (pediatric): Secondary | ICD-10-CM | POA: Diagnosis present

## 2014-06-25 DIAGNOSIS — I480 Paroxysmal atrial fibrillation: Secondary | ICD-10-CM | POA: Diagnosis not present

## 2014-06-25 DIAGNOSIS — M1990 Unspecified osteoarthritis, unspecified site: Secondary | ICD-10-CM | POA: Diagnosis not present

## 2014-06-25 DIAGNOSIS — R079 Chest pain, unspecified: Secondary | ICD-10-CM | POA: Diagnosis not present

## 2014-06-25 DIAGNOSIS — Z8719 Personal history of other diseases of the digestive system: Secondary | ICD-10-CM | POA: Diagnosis not present

## 2014-06-25 DIAGNOSIS — E785 Hyperlipidemia, unspecified: Secondary | ICD-10-CM | POA: Diagnosis not present

## 2014-06-25 DIAGNOSIS — Z79899 Other long term (current) drug therapy: Secondary | ICD-10-CM | POA: Diagnosis not present

## 2014-06-25 DIAGNOSIS — I248 Other forms of acute ischemic heart disease: Secondary | ICD-10-CM | POA: Diagnosis not present

## 2014-06-25 DIAGNOSIS — G4730 Sleep apnea, unspecified: Secondary | ICD-10-CM

## 2014-06-25 DIAGNOSIS — I2510 Atherosclerotic heart disease of native coronary artery without angina pectoris: Secondary | ICD-10-CM

## 2014-06-25 DIAGNOSIS — R001 Bradycardia, unspecified: Secondary | ICD-10-CM | POA: Diagnosis not present

## 2014-06-25 DIAGNOSIS — I4819 Other persistent atrial fibrillation: Secondary | ICD-10-CM | POA: Diagnosis present

## 2014-06-25 HISTORY — DX: Disorder of kidney and ureter, unspecified: N28.9

## 2014-06-25 LAB — BRAIN NATRIURETIC PEPTIDE: B NATRIURETIC PEPTIDE 5: 631.3 pg/mL — AB (ref 0.0–100.0)

## 2014-06-25 LAB — BASIC METABOLIC PANEL
ANION GAP: 10 (ref 5–15)
BUN: 17 mg/dL (ref 6–20)
CALCIUM: 9.7 mg/dL (ref 8.9–10.3)
CO2: 26 mmol/L (ref 22–32)
Chloride: 102 mmol/L (ref 101–111)
Creatinine, Ser: 1.28 mg/dL — ABNORMAL HIGH (ref 0.44–1.00)
GFR calc Af Amer: 48 mL/min — ABNORMAL LOW (ref >60)
GFR, EST NON AFRICAN AMERICAN: 41 mL/min — AB (ref >60)
Glucose, Bld: 109 mg/dL — ABNORMAL HIGH (ref 65–99)
Potassium: 3.8 mmol/L (ref 3.5–5.1)
SODIUM: 138 mmol/L (ref 135–145)

## 2014-06-25 LAB — CBC
HCT: 44.2 % (ref 36.0–46.0)
HEMOGLOBIN: 14.6 g/dL (ref 12.0–15.0)
MCH: 27.6 pg (ref 26.0–34.0)
MCHC: 33 g/dL (ref 30.0–36.0)
MCV: 83.6 fL (ref 78.0–100.0)
Platelets: 263 10*3/uL (ref 150–400)
RBC: 5.29 MIL/uL — ABNORMAL HIGH (ref 3.87–5.11)
RDW: 14.2 % (ref 11.5–15.5)
WBC: 10.9 10*3/uL — ABNORMAL HIGH (ref 4.0–10.5)

## 2014-06-25 LAB — I-STAT TROPONIN, ED
TROPONIN I, POC: 1.4 ng/mL — AB (ref 0.00–0.08)
Troponin i, poc: 1.33 ng/mL (ref 0.00–0.08)

## 2014-06-25 LAB — TROPONIN I: Troponin I: 2.21 ng/mL (ref <0.031)

## 2014-06-25 LAB — PROTIME-INR
INR: 2.53 — ABNORMAL HIGH (ref 0.00–1.49)
Prothrombin Time: 26.9 seconds — ABNORMAL HIGH (ref 11.6–15.2)

## 2014-06-25 MED ORDER — WARFARIN SODIUM 2.5 MG PO TABS
2.5000 mg | ORAL_TABLET | Freq: Once | ORAL | Status: AC
  Administered 2014-06-25: 2.5 mg via ORAL
  Filled 2014-06-25: qty 1

## 2014-06-25 MED ORDER — SODIUM CHLORIDE 0.9 % IJ SOLN
3.0000 mL | Freq: Two times a day (BID) | INTRAMUSCULAR | Status: DC
  Administered 2014-06-25 – 2014-06-26 (×2): 3 mL via INTRAVENOUS

## 2014-06-25 MED ORDER — MAGNESIUM OXIDE 400 MG PO TABS: 400.0000 mg | ORAL_TABLET | Freq: Every day | ORAL | Status: DC

## 2014-06-25 MED ORDER — NITROGLYCERIN 0.4 MG SL SUBL: 0.4000 mg | SUBLINGUAL_TABLET | SUBLINGUAL | Status: DC | PRN

## 2014-06-25 MED ORDER — ZOLPIDEM TARTRATE 5 MG PO TABS: 5.0000 mg | ORAL_TABLET | Freq: Every evening | ORAL | Status: DC | PRN

## 2014-06-25 MED ORDER — SODIUM CHLORIDE 0.9 % IV SOLN: 250.0000 mL | INTRAVENOUS | Status: DC | PRN

## 2014-06-25 MED ORDER — ACETAMINOPHEN 325 MG PO TABS
650.0000 mg | ORAL_TABLET | ORAL | Status: DC | PRN
  Administered 2014-06-26: 650 mg via ORAL
  Filled 2014-06-25: qty 2

## 2014-06-25 MED ORDER — ONDANSETRON HCL 4 MG/2ML IJ SOLN: 4.0000 mg | Freq: Four times a day (QID) | INTRAMUSCULAR | Status: DC | PRN

## 2014-06-25 MED ORDER — METOPROLOL TARTRATE 100 MG PO TABS
100.0000 mg | ORAL_TABLET | Freq: Two times a day (BID) | ORAL | Status: DC
  Administered 2014-06-26: 100 mg via ORAL
  Filled 2014-06-25: qty 1

## 2014-06-25 MED ORDER — FUROSEMIDE 40 MG PO TABS
40.0000 mg | ORAL_TABLET | Freq: Every day | ORAL | Status: DC
  Administered 2014-06-25 – 2014-06-26 (×2): 40 mg via ORAL
  Filled 2014-06-25: qty 1
  Filled 2014-06-25: qty 2

## 2014-06-25 MED ORDER — SODIUM CHLORIDE 0.9 % IJ SOLN: 3.0000 mL | INTRAMUSCULAR | Status: DC | PRN

## 2014-06-25 MED ORDER — ASPIRIN 81 MG PO CHEW
324.0000 mg | CHEWABLE_TABLET | Freq: Once | ORAL | Status: AC
  Administered 2014-06-25: 324 mg via ORAL
  Filled 2014-06-25: qty 4

## 2014-06-25 MED ORDER — MAGNESIUM OXIDE 400 (241.3 MG) MG PO TABS
400.0000 mg | ORAL_TABLET | Freq: Every day | ORAL | Status: DC
  Administered 2014-06-25: 400 mg via ORAL
  Filled 2014-06-25: qty 1

## 2014-06-25 MED ORDER — POTASSIUM CHLORIDE CRYS ER 10 MEQ PO TBCR
10.0000 meq | EXTENDED_RELEASE_TABLET | Freq: Two times a day (BID) | ORAL | Status: DC
  Administered 2014-06-25 – 2014-06-26 (×2): 10 meq via ORAL
  Filled 2014-06-25 (×2): qty 1

## 2014-06-25 MED ORDER — WARFARIN SODIUM 2.5 MG PO TABS: 2.5000 mg | ORAL_TABLET | Freq: Every day | ORAL | Status: DC

## 2014-06-25 MED ORDER — AMIODARONE HCL 200 MG PO TABS
400.0000 mg | ORAL_TABLET | Freq: Two times a day (BID) | ORAL | Status: DC
  Administered 2014-06-25 – 2014-06-26 (×2): 400 mg via ORAL
  Filled 2014-06-25 (×2): qty 2

## 2014-06-25 MED ORDER — ALPRAZOLAM 0.25 MG PO TABS: 0.2500 mg | ORAL_TABLET | Freq: Two times a day (BID) | ORAL | Status: DC | PRN

## 2014-06-25 MED ORDER — WARFARIN - PHYSICIAN DOSING INPATIENT
Freq: Every day | Status: DC
  Administered 2014-06-25: 18:00:00

## 2014-06-25 NOTE — ED Provider Notes (Signed)
CSN: [id number]     Arrival date & time 06/25/14  1049 History   First MD Initiated Contact with Patient 06/25/14 1126     Chief Complaint  Patient presents with  . Atrial Fibrillation     (Consider location/radiation/quality/duration/timing/severity/associated sxs/prior Treatment) The history is provided by the patient.     [name] is a 69 y.o. female who is here for evaluation of 2 episodes of chest pressure associated with a sensation of rapid heartbeat and pulse checked at 140/m. , Both episodes resolved spontaneously. Both episodes occurred yesterday. This morning she took off her medicines and decided to come here, after calling her cardiologist office for evaluation. She has not had any chest pain since yesterday. She has ongoing shortness of breath with exertion. She denies fever, chills, cough, back pain, weakness or dizziness. There are no other known modifying factors.  Past Medical History  Diagnosis Date  . Hyperlipidemia   . Hypertension   . Osteoporosis   . PAF (paroxysmal atrial fibrillation)     a. s/p multiple cardioversions;  b. 2010: on Tikosyn - dose decreased from to in setting of NSTEMI/QT prolongation. Did not hold NSR. c. admitted 08/2011 with loading at BID with DCCV at that time;  d. Chronic coumadin (CHA2DS2VASc = 5).  [name] CAD (coronary artery disease)     a. 07/2008 NSTEMI ->Cath: LM nl, LAD 70p/90p, 39m, LCX nl, RCA nl, EF 60%.  The LAD was stented with a 2.25x87mm Veri-Flex BMS.  b. 8/15 cath patent stent, nonobstructive disease otherwise - unchanged. Elevated troponin felt due to demand ischemia in setting of AF-RVR.  . Duodenitis   . Headache(784.0)   . Chronic diastolic CHF (congestive heart failure)     a. 07/2008 Echo: EF 60-65%, Triv AI/MR, Mild TR;  b. 09/2013 Echo: EF 60-65%, mild conc LVH.  . Morbid obesity   . Difficult intubation   . History of positive PPD   . Arthritis    Past Surgical History  Procedure Laterality  Date  . Cholecystectomy    . Abdominal hysterectomy      partial, bleeding  . Knee arthroscopy  2000 & 2001    left and right  . Esophagogastroduodenoscopy  2003  . Coronary stent placement    . Tubal ligation    . Total knee arthroplasty      bilateral  . Left heart catheterization with coronary angiogram N/A 09/24/2013    Procedure: LEFT HEART CATHETERIZATION WITH CORONARY ANGIOGRAM;  Surgeon: Micheline Chapman, MD;  Location: [facility] CATH LAB;  Service: Cardiovascular;  Laterality: N/A;   Family History  Problem Relation Age of Onset  . Other Mother     GI Bleed  . Pneumonia Father   . Coronary artery disease Brother   . Breast cancer Paternal Grandmother   . Rectal cancer Paternal Grandfather    History  Substance Use Topics  . Smoking status: Never Smoker   . Smokeless tobacco: Never Used  . Alcohol Use: No   OB History    No data available     Review of Systems  All other systems reviewed and are negative.     Allergies  Rosuvastatin  Home Medications   Prior to Admission medications   Medication Sig Start Date End Date Taking? Authorizing Provider  acetaminophen (TYLENOL) 500 MG tablet Take 1,000 mg by mouth every 6 (six) hours as needed for headache.    Historical Provider, MD  atorvastatin (LIPITOR) 40 MG tablet  Take 1 tablet (40 mg total) by mouth daily at 6 PM. 04/21/14   Rollene Rotunda, MD  dofetilide (TIKOSYN) 500 MCG capsule Take 1 capsule (500 mcg total) by mouth every 12 (twelve) hours. 04/22/14   Rollene Rotunda, MD  furosemide (LASIX) 40 MG tablet Take 1 tablet (40 mg total) by mouth daily. 04/26/14   Rollene Rotunda, MD  magnesium oxide (MAG-OX) 400 MG tablet Take 1 tablet (400 mg total) by mouth daily. 04/22/14   Rollene Rotunda, MD  metoprolol (LOPRESSOR) 100 MG tablet Take 1 tablet (100 mg total) by mouth 2 (two) times daily. 04/23/14   Rollene Rotunda, MD  nitroGLYCERIN (NITROSTAT) 0.4 MG SL tablet Place 1 tablet (0.4 mg total) under the tongue every 5  (five) minutes x 3 doses as needed for chest pain. 10/23/13   Rollene Rotunda, MD  potassium chloride (K-DUR,KLOR-CON) 10 MEQ tablet Take 1 tablet (10 mEq total) by mouth 2 (two) times daily. 04/30/14   Rollene Rotunda, MD  warfarin (COUMADIN) 5 MG tablet Take 1 tablet (5 mg total) by mouth as directed. 04/22/14   Rollene Rotunda, MD   BP 106/54 mmHg  Pulse 72  Temp(Src) 97.9 F (36.6 C)  Resp 13  SpO2 97% Physical Exam  Constitutional: She is oriented to person, place, and time. She appears well-developed.  Obese  HENT:  Head: Normocephalic and atraumatic.  Right Ear: External ear normal.  Left Ear: External ear normal.  Eyes: Conjunctivae and EOM are normal. Pupils are equal, round, and reactive to light.  Neck: Normal range of motion and phonation normal. Neck supple.  Cardiovascular: Regular rhythm and normal heart sounds.   Bradycardia  Pulmonary/Chest: Effort normal and breath sounds normal. No respiratory distress. She has no wheezes. She has no rales. She exhibits no bony tenderness.  Abdominal: Soft. There is no tenderness.  Musculoskeletal: Normal range of motion. She exhibits edema (2+ bilaterally). She exhibits no tenderness.  Neurological: She is alert and oriented to person, place, and time. No cranial nerve deficit or sensory deficit. She exhibits normal muscle tone. Coordination normal.  Skin: Skin is warm, dry and intact.  Psychiatric: She has a normal mood and affect. Her behavior is normal. Judgment and thought content normal.  Nursing note and vitals reviewed.   ED Course  Procedures (including critical care time)  Medications  aspirin chewable tablet 324 mg (324 mg Oral Given 06/25/14 1140)    Patient Vitals for the past 24 hrs:  BP Temp Pulse Resp SpO2  06/25/14 1130 (!) 106/54 mmHg - 72 13 97 %  06/25/14 1058 141/62 mmHg 97.9 F (36.6 C) 60 18 97 %    11:35- page to cardiology to evaluate the patient in the emergency department. Done.  12:12 PM  Reevaluation with update and discussion. After initial assessment and treatment, an updated evaluation reveals no change in clinical status. Patient updated on findings. Still awaiting cardiology evaluation in the emergency department.Mancel Bale L    CRITICAL CARE Performed by: Flint Melter Total critical care time: 35 min. Critical care time was exclusive of separately billable procedures and treating other patients. Critical care was necessary to treat or prevent imminent or life-threatening deterioration. Critical care was time spent personally by me on the following activities: development of treatment plan with patient and/or surrogate as well as nursing, discussions with consultants, evaluation of patient's response to treatment, examination of patient, obtaining history from patient or surrogate, ordering and performing treatments and interventions, ordering and review of laboratory studies, ordering  and review of radiographic studies, pulse oximetry and re-evaluation of patient's condition.    Labs Review Labs Reviewed  BASIC METABOLIC PANEL - Abnormal; Notable for the following:    Glucose, Bld 109 (*)    Creatinine, Ser 1.28 (*)    GFR calc non Af Amer 41 (*)    GFR calc Af Amer 48 (*)    All other components within normal limits  CBC - Abnormal; Notable for the following:    WBC 10.9 (*)    RBC 5.29 (*)    All other components within normal limits  BRAIN NATRIURETIC PEPTIDE - Abnormal; Notable for the following:    B Natriuretic Peptide 631.3 (*)    All other components within normal limits  PROTIME-INR - Abnormal; Notable for the following:    Prothrombin Time 26.9 (*)    INR 2.53 (*)    All other components within normal limits  I-STAT TROPOININ, ED - Abnormal; Notable for the following:    Troponin i, poc 1.40 (*)    All other components within normal limits    Imaging Review No results found.   EKG Interpretation   Date/Time:  Friday Jun 25 2014  10:55:14 EDT Ventricular Rate:  56 PR Interval:  166 QRS Duration: 86 QT Interval:  438 QTC Calculation: 422 R Axis:   70 Text Interpretation:  Sinus bradycardia with sinus arrhythmia Nonspecific  ST and T wave abnormality Abnormal ECG since last tracing no significant  change Confirmed by Effie Shy  MD, Demeka Sutter ([id number]) on 06/25/2014 11:27:24 AM      MDM   Final diagnoses:  NSTEMI (non-ST elevated myocardial infarction)  Paroxysmal atrial fibrillation    Procedure fibrillation with episodic chest pain, and elevated troponin consistent with an STEMI. At the time of evaluation, she is hemodynamically stable. She will need to be evaluated further by cardiology. She had a recent cardiac catheterization.   Nursing Notes Reviewed/ Care Coordinated, and agree without changes. Applicable Imaging Reviewed.  Interpretation of Laboratory Data incorporated into ED treatment   Plan: Cardiology evaluation, in emergency department    Mancel Bale, MD 06/25/14 1731

## 2014-06-25 NOTE — H&P (Signed)
History and Physical   Patient ID: IVIANNA NOTCH MRN: [id number], DOB/AGE: 1946-01-12 69 y.o. Date of Encounter: 06/25/2014  Primary Physician: Ruthe Mannan, MD Primary Cardiologist: Dr Antoine Poche Electrophysiologist: Dr. Johney Frame  Chief Complaint:  Chest pain, tachypalpitations  HPI: JENNFIER ABDULLA is a 69 y.o. female with a history of LAD stent 2010, NSTEMI in the setting of rapid afib w/ non-obs dz by cath 2015, D-CHF, coumadin, HTN, HL and morbid obesity. She had chest pain and heart racing and came to the ER. Cardiology asked to admit.   Ms. Duey is compliant with her medications including Tikosyn and Coumadin. She also uses her C Pap every night. She admits to lower extremity edema, mostly during the day and denies PND/orthopnea. She has chronic dyspnea on exertion and cannot do things such as bring groceries in from the car or sweep the floor without stopping to rest. She admits to weight gain but states that it is not all fluid, she feels she has been overeating. She has had no bleeding issues on the Coumadin and no other acute illnesses.  For the last 6 months, Ms. Shew has been having episodes of atrial fibrillation associated with chest tightness 3 or 4 times a month. 5 days ago, she had an episode that started after she had had a bad headache for 2 days. That episode lasted 5 hours. She took the when necessary metoprolol 50 mg, and her symptoms finally resolved.  Yesterday, she had another episode of atrial fibrillation. It started about 6:30 PM and lasted all evening. She fell asleep in the recliner about 11 p.m. and woke up at 2 AM in sinus rhythm. With the atrial fibrillation, her heart rate was generally greater than 130, and did not immediately respond to her when necessary metoprolol 50 mg. She had to repeat it because her heart rate and blood pressure will still very elevated and she was symptomatic with this.   This a.m., when she woke up, her heart rate was 50 and she felt  sure she was in sinus rhythm. She called the office and was asked to come to the emergency room. In the emergency room, she is maintaining sinus rhythm/sinus bradycardia and is asymptomatic. Once her atrial fibrillation resolved, her chest pain resolved as well. Although she has dyspnea on exertion which she feels is worse than it was last year, she has not noticed any sudden change, feels that she is gradually able to do less and less. Currently in the ER, she is resting comfortably.   Past Medical History  Diagnosis Date  . Hyperlipidemia   . Hypertension   . Osteoporosis   . PAF (paroxysmal atrial fibrillation)     a. s/p multiple cardioversions;  b. 2010: on Tikosyn - dose decreased from to in setting of NSTEMI/QT prolongation. Did not hold NSR. c. admitted 08/2011 with loading at BID with DCCV at that time;  d. Chronic coumadin (CHA2DS2VASc = 5).  [name] CAD (coronary artery disease)     a. 07/2008 NSTEMI ->Cath: LM nl, LAD 70p/90p, 70m, LCX nl, RCA nl, EF 60%.  The LAD was stented with a 2.25x69mm Veri-Flex BMS.  b. 8/15 cath patent stent, nonobstructive disease otherwise - unchanged. Elevated troponin felt due to demand ischemia in setting of AF-RVR.  . Duodenitis   . Headache(784.0)   . Chronic diastolic CHF (congestive heart failure)     a. 07/2008 Echo: EF 60-65%, Triv AI/MR, Mild TR;  b. 09/2013  Echo: EF 60-65%, mild conc LVH.  . Morbid obesity   . Difficult intubation   . History of positive PPD   . Arthritis     Surgical History:  Past Surgical History  Procedure Laterality Date  . Cholecystectomy    . Abdominal hysterectomy      partial, bleeding  . Knee arthroscopy  2000 & 2001    left and right  . Esophagogastroduodenoscopy  2003  . Coronary stent placement    . Tubal ligation    . Total knee arthroplasty      bilateral  . Left heart catheterization with coronary angiogram N/A 09/24/2013    Procedure: LEFT HEART CATHETERIZATION WITH CORONARY ANGIOGRAM;   Surgeon: Micheline Chapman, MD;  [facility] [place], LAD stent patent, mLAD 50-60%, CFX OK, RCA 30-40%, EF 70%  . Coronary angioplasty with stent placement  2010    2.75- x 20-mm VeriFlex DES to the pLAD     I have reviewed the patient's current medications. Prior to Admission medications   Medication Sig Start Date End Date Taking? Authorizing Provider  acetaminophen (TYLENOL) 500 MG tablet Take 1,000 mg by mouth every 6 (six) hours as needed for headache.    Historical Provider, MD  atorvastatin (LIPITOR) 40 MG tablet Take 1 tablet (40 mg total) by mouth daily at 6 PM. 04/21/14   Rollene Rotunda, MD  dofetilide (TIKOSYN) 500 MCG capsule Take 1 capsule (500 mcg total) by mouth every 12 (twelve) hours. 04/22/14   Rollene Rotunda, MD  furosemide (LASIX) 40 MG tablet Take 1 tablet (40 mg total) by mouth daily. 04/26/14   Rollene Rotunda, MD  magnesium oxide (MAG-OX) 400 MG tablet Take 1 tablet (400 mg total) by mouth daily. 04/22/14   Rollene Rotunda, MD  metoprolol (LOPRESSOR) 100 MG tablet Take 1 tablet (100 mg total) by mouth 2 (two) times daily. 04/23/14   Rollene Rotunda, MD  nitroGLYCERIN (NITROSTAT) 0.4 MG SL tablet Place 1 tablet (0.4 mg total) under the tongue every 5 (five) minutes x 3 doses as needed for chest pain. 10/23/13   Rollene Rotunda, MD  potassium chloride (K-DUR,KLOR-CON) 10 MEQ tablet Take 1 tablet (10 mEq total) by mouth 2 (two) times daily. 04/30/14   Rollene Rotunda, MD  warfarin (COUMADIN) 5 MG tablet Take 1 tablet (5 mg total) by mouth as directed. 04/22/14   Rollene Rotunda, MD   Allergies:  Allergies  Allergen Reactions  . Rosuvastatin Other (See Comments)    REACTION: muscle and joint pain  . Statins Other (See Comments)    intolerance    History   Social History  . Marital Status: Widowed    Spouse Name: N/A  . Number of Children: 2  . Years of Education: N/A   Occupational History  . retired    Social History Main Topics  . Smoking status: Never Smoker   . Smokeless  tobacco: Never Used  . Alcohol Use: No  . Drug Use: No  . Sexual Activity: Not Currently    Birth Control/ Protection: Post-menopausal   Other Topics Concern  . Not on file   Social History Narrative   Lives in [place], [place] w/ husband.    Family History  Problem Relation Age of Onset  . Other Mother     GI Bleed  . Pneumonia Father   . Coronary artery disease Brother   . Breast cancer Paternal Grandmother   . Rectal cancer Paternal Grandfather    Family Status  Relation Status Death Age  .  Mother Deceased   . Father Deceased   . Brother Alive     Review of Systems:   Full 14-point review of systems otherwise negative except as noted above.  Physical Exam: Blood pressure 121/67, pulse 53, temperature 97.9 F (36.6 C), resp. rate 16, height 5\' 5"  (1.651 m), weight 283 lb 6.4 oz (128.549 kg), SpO2 100 %. General: Well developed, well nourished,female in no acute distress. Head: Normocephalic, atraumatic, sclera non-icteric, no xanthomas, nares are without discharge. Dentition: Good Neck: No carotid bruits. JVD not elevated but very difficult to assess due to body habitus. No thyromegally Lungs: Good expansion bilaterally. without wheezes or rhonchi. Clear bilaterally Heart: Slightly IRRegular rate and rhythm with S1 S2.  No S3 or S4.  2/6 murmur, no rubs, or gallops appreciated. Abdomen: Soft, non-tender, non-distended with normoactive bowel sounds. No hepatomegaly. No rebound/guarding. No obvious abdominal masses. Msk:  Strength and tone appear normal for age. No joint deformities or effusions, no spine or costo-vertebral angle tenderness. Extremities: No clubbing or cyanosis. Trace-1+ edema.  Distal pedal pulses are 2+ in 4 extrem Neuro: Alert and oriented X 3. Moves all extremities spontaneously. No focal deficits noted. Psych:  Responds to questions appropriately with a normal affect. Skin: No rashes or lesions noted  Labs:   Lab Results  Component Value Date   WBC  10.9* 06/25/2014   HGB 14.6 06/25/2014   HCT 44.2 06/25/2014   MCV 83.6 06/25/2014   PLT 263 06/25/2014    Recent Labs  06/25/14 1026  INR 2.53*     Recent Labs Lab 06/25/14 1026  NA 138  K 3.8  CL 102  CO2 26  BUN 17  CREATININE 1.28*  CALCIUM 9.7  GLUCOSE 109*    Recent Labs  06/25/14 1115  TROPIPOC 1.40*   Lab Results  Component Value Date   CHOL 205* 09/23/2013   HDL 41 09/23/2013   LDLCALC 140* 09/23/2013   TRIG 122 09/23/2013   B NATRIURETIC PEPTIDE  Date/Time Value Ref Range Status  06/25/2014 11:07 AM 631.3* 0.0 - 100.0 pg/mL Final    Radiology/Studies: No results found. chest x-ray ordered   ECG: SR, no acute ischemic changes  ASSESSMENT:     PAF (paroxysmal atrial fibrillation)   Obesity, morbid   Essential hypertension   Sleep apnea - on CPAP   Chronic anticoagulation   Chronic diastolic CHF (congestive heart failure)   Demand ischemia  Plan:  Admit to telemetry, continue to cycle enzymes. However, since she had a similar episode less than one year ago, with cardiac catheterization negative at that time, no further ischemic workup is planned. Continue Coumadin. Continue CPAP. She admits to poor dietary habits, agrees to a nutrition consult. Monitor weight and intake/output. Do not feel she is acutely volume overloaded at this time.   We'll have EP to see, as with her more frequent episodes of atrial fibrillation and the episodes becoming longer duration, feel that a treatment change Is likely indicated.  Melida Quitter, PA-C 06/25/2014 2:09 PM Beeper [phone]   Patient seen and examined with Theodore Demark, PA-C. We discussed all aspects of the encounter. I agree with the assessment and plan as stated above.   69 y/o obese female with CAD, OSA on CPAP with PAF on Tikosyn. Presents with recurrent PAF (now back in NSR) and elevated troponin. Had similar presentation last year and cath showed stable nonobstructive CAD with patent  stent. Over past several months increasing episodes of PAF.  At this  point it seems like she has failed Tikosyn particularly in light of the fact that she in now having nontrivial elevations in troponin. Suspect she needs amio vs ablation. Given her size, ablation may be less attractive option. Have asked EP to see. Will observe overnight given elevated troponin.   Bensimhon, Daniel,MD 2:23 PM

## 2014-06-25 NOTE — ED Notes (Signed)
MD Wentz at the bedside  

## 2014-06-25 NOTE — ED Notes (Signed)
Pt here for 2 episodes of a fib last night with HR around 140. sts some SOB and chest tightness. sts nothing this morning.

## 2014-06-25 NOTE — ED Notes (Signed)
Attempted IV x2. Unable to access.  

## 2014-06-25 NOTE — Telephone Encounter (Signed)
Pt c/o of Chest Pain: 1. Are you having CP right now? No  2. Are you experiencing any other symptoms (ex. SOB, nausea, vomiting, sweating)? SOB, Break out in a sweat and tightness in her chest 3. How long have you been experiencing CP? 24-48 hours now  4. Is your CP continuous or coming and going? Coming and going  5. Have you taken Nitroglycerin? No. Nitro's do not seem to help. She was told that she could take an extra half of her metoprolol, but that hasn't worked either. It seems to just go on and on.   Comments: Pt called having a problem. She states that she is a chroic afib pt. Pt also states that her afib usually only last for moments. But, this episode has lasted for hours.  Last night she was in from 4:45p until 10:45p. She woke up about 2p and it was over.

## 2014-06-25 NOTE — Telephone Encounter (Signed)
Received a call from patient stating she would like to be seen today.Stated she has had 2 episodes of atrial fib this week.Stating last night episode lasted over 5 hours.Stated she had chest tightness,sob that lasted over 5 hours last night.No chest tightness at present just feels bad.Stated past episodes of atrial fib last a few min.Stated she is concerned these past 2 episodes lasting over 5 hours each.Spoke to DOD Dr.Crenshaw he advised needs to go to ER with chest tightness lasting 5 hours.Trish called.

## 2014-06-25 NOTE — ED Notes (Signed)
Patient reported last night that she started to feel her heart race and started to become SOB around five o'clock last night. Patient reports becoming diaphoretic and feeling some pressure in her chest during the episode. Patient's HR was 140 at the time of the episode. Patient went to bed with symptoms, and woke up at 0200 this morning feeling better.

## 2014-06-25 NOTE — Consult Note (Signed)
ELECTROPHYSIOLOGY CONSULT NOTE    Patient ID: [name] MRN: [id number], DOB/AGE: [birth year] 69 y.o.  Admit date: 06/25/2014 Date of Consult: 06/25/2014  Primary Physician: Ruthe Mannan, MD Primary Cardiologist: Hochrein Electrophysiologist: [place] Lebeau  Reason for Consultation: atrial fibrillation  HPI:  [name] is a 69 y.o. female with a past medical history significant for hypertension, CAD (prior stent to LAD), chronic diastolic heart failure, morbid obesity, and paroxysmal atrial fibrillation.  She has been maintained on Tikosyn and Warfarin and has done well for the most part, but is having increasing episodes of atrial fibrillation associated with palpitations and chest pain. She presented to the ER today after recurrent episodes of AF associated with chest pain.  On arrival, she was in sinus rhythm, her troponin was elevated and she is being admitted by general cardiology for further evaluation.   Last echo 09/2013 demonstrated EF 60-65%, mild concentric hypertrophy, LA 40.   She was evaluated in October of 2015 for alternative treatment options to AF.  At that time, lifestyle modification was recommended with continuation of Tikosyn.    She currently denies chest pain, shortness of breath, LE edema, recent fevers, chills, nausea or vomiting. She reports compliance with anticoagulation.   Past Medical History  Diagnosis Date  . Hyperlipidemia   . Hypertension   . Osteoporosis   . PAF (paroxysmal atrial fibrillation)     a. s/p multiple cardioversions;  b. 2010: on Tikosyn - dose decreased from to in setting of NSTEMI/QT prolongation. Did not hold NSR. c. admitted 08/2011 with loading at BID with DCCV at that time;  d. Chronic coumadin (CHA2DS2VASc = 5).  [name] CAD (coronary artery disease)     a. 07/2008 NSTEMI ->Cath: LM nl, LAD 70p/90p, 71m, LCX nl, RCA nl, EF 60%.  The LAD was stented with a 2.25x4mm Veri-Flex BMS.  b. 8/15 cath patent stent,  nonobstructive disease otherwise - unchanged. Elevated troponin felt due to demand ischemia in setting of AF-RVR.  . Duodenitis   . Headache(784.0)   . Chronic diastolic CHF (congestive heart failure)     a. 07/2008 Echo: EF 60-65%, Triv AI/MR, Mild TR;  b. 09/2013 Echo: EF 60-65%, mild conc LVH.  . Morbid obesity   . Difficult intubation   . History of positive PPD   . Arthritis      Surgical History:  Past Surgical History  Procedure Laterality Date  . Cholecystectomy    . Abdominal hysterectomy      partial, bleeding  . Knee arthroscopy  2000 & 2001    left and right  . Esophagogastroduodenoscopy  2003  . Coronary stent placement    . Tubal ligation    . Total knee arthroplasty      bilateral  . Left heart catheterization with coronary angiogram N/A 09/24/2013    Procedure: LEFT HEART CATHETERIZATION WITH CORONARY ANGIOGRAM;  Surgeon: Micheline Chapman, MD;  [facility] [place], LAD stent patent, mLAD 50-60%, CFX [place], RCA 30-40%, EF 70%  . Coronary angioplasty with stent placement  2010    2.75- x 20-mm VeriFlex DES to the pLAD    Current outpatient prescriptions:  .  acetaminophen (TYLENOL) 500 MG tablet, Take 1,000 mg by mouth every 6 (six) hours as needed for headache., Disp: , Rfl:  .  dofetilide (TIKOSYN) 500 MCG capsule, Take 1 capsule (500 mcg total) by mouth every 12 (twelve) hours., Disp: 180 capsule, Rfl: 3 .  furosemide (LASIX) 40 MG tablet, Take 1 tablet (  40 mg total) by mouth daily., Disp: 30 tablet, Rfl: 6 .  magnesium oxide (MAG-OX) 400 MG tablet, Take 1 tablet (400 mg total) by mouth daily., Disp: 90 tablet, Rfl: 3 .  metoprolol (LOPRESSOR) 100 MG tablet, Take 1 tablet (100 mg total) by mouth 2 (two) times daily., Disp: 180 tablet, Rfl: 3 .  nitroGLYCERIN (NITROSTAT) 0.4 MG SL tablet, Place 1 tablet (0.4 mg total) under the tongue every 5 (five) minutes x 3 doses as needed for chest pain., Disp: 25 tablet, Rfl: 12 .  potassium chloride (K-DUR,KLOR-CON) 10 MEQ tablet, Take 1  tablet (10 mEq total) by mouth 2 (two) times daily., Disp: 180 tablet, Rfl: 2 .  warfarin (COUMADIN) 2.5 MG tablet, Take 2.5 mg by mouth daily., Disp: , Rfl:  .  atorvastatin (LIPITOR) 40 MG tablet, Take 1 tablet (40 mg total) by mouth daily at 6 PM. (Patient not taking: Reported on 06/25/2014), Disp: 90 tablet, Rfl: 1 .  warfarin (COUMADIN) 5 MG tablet, Take 1 tablet (5 mg total) by mouth as directed. (Patient not taking: Reported on 06/25/2014), Disp: 90 tablet, Rfl: 3   Allergies:  Allergies  Allergen Reactions  . Rosuvastatin Other (See Comments)    REACTION: muscle and joint pain  . Statins Other (See Comments)    intolerance    History   Social History  . Marital Status: Widowed    Spouse Name: N/A  . Number of Children: 2  . Years of Education: N/A   Occupational History  . retired    Social History Main Topics  . Smoking status: Never Smoker   . Smokeless tobacco: Never Used  . Alcohol Use: No  . Drug Use: No  . Sexual Activity: Not Currently    Birth Control/ Protection: Post-menopausal   Other Topics Concern  . Not on file   Social History Narrative   Lives in [place], [place] w/ husband.     Family History  Problem Relation Age of Onset  . Other Mother     GI Bleed  . Pneumonia Father   . Coronary artery disease Brother   . Breast cancer Paternal Grandmother   . Rectal cancer Paternal Grandfather      Review of Systems: All other systems reviewed and are otherwise negative except as noted above.  Physical Exam: Filed Vitals:   06/25/14 1300 06/25/14 1315 06/25/14 1345 06/25/14 1351  BP: 125/56 126/61 121/67   Pulse: 55 56 53   Temp:      Resp: 20 24 16    Height:    5\' 5"  (1.651 m)  Weight:    283 lb 6.4 oz (128.549 kg)  SpO2: 96% 96% 100%     GEN- The patient is elderly and morbidly obese appearing, alert and oriented x 3 today.   HEENT: normocephalic, atraumatic; sclera clear, conjunctiva pink; hearing intact; oropharynx clear; neck supple    Lymph- no cervical lymphadenopathy Lungs- Clear to ausculation bilaterally, normal work of breathing.  No wheezes, rales, rhonchi Heart- Regular rate and rhythm, 2/6 SEM GI- obese, non-tender, non-distended, bowel sounds present  Extremities- no clubbing, cyanosis, or edema; DP/PT/radial pulses 2+ bilaterally MS- no significant deformity or atrophy Skin- warm and dry, no rash or lesion Psych- euthymic mood, full affect Neuro- strength and sensation are intact  Labs:   Lab Results  Component Value Date   WBC 10.9* 06/25/2014   HGB 14.6 06/25/2014   HCT 44.2 06/25/2014   MCV 83.6 06/25/2014   PLT 263 06/25/2014  Recent Labs Lab 06/25/14 1026  NA 138  K 3.8  CL 102  CO2 26  BUN 17  CREATININE 1.28*  CALCIUM 9.7  GLUCOSE 109*      Radiology/Studies: Dg Chest Port 1 View 06/25/2014   CLINICAL DATA:  Two episodes of atrial fibrillation, chest pain, history hypertension, CHF, hyperlipidemia, coronary artery disease  EXAM: PORTABLE CHEST - 1 VIEW  COMPARISON:  Portable exam 1417 hours compared to 09/22/2013  FINDINGS: Enlargement of cardiac silhouette.  Atherosclerotic calcification aorta.  Minimal pulmonary vascular congestion.  No acute infiltrate, pleural effusion or pneumothorax.  Minimal atelectasis at RIGHT base.  Bones mildly demineralized.  IMPRESSION: Enlargement of cardiac silhouette with pulmonary vascular congestion.  Minimal RIGHT basilar infiltrate without acute infiltrate or edema.   Electronically Signed   By: Ulyses Southward M.D.   On: 06/25/2014 14:23   EKG: sinus bradycardia, rate 56, non specific STT changes, QT stable  TELEMETRY: sinus bradycardia  Assessment/Plan: 1.  Paroxysmal atrial fibrillation The patient has paroxysmal atrial fibrillation that has been mostly controlled with Tikosyn. She has had increasing episodes recently associated with chest pain and palpitations.  With her obesity, ablation success rates are decreased.   Not a candidate for  Flecainide with CAD; Sotalol less than ideal with bradycardia Will discontinue Tikosyn and start Amiodarone  twice daily.  The patient is aware of risks with this medication and wishes to proceed. Will check LFT's, TFT's in the morning. She will need PFT's as an outpatient as well as eye exam. Lifestyle modification encouraged.   Continue Warfarin for CHADS2VASC score of 4  2.  Elevated troponin Likely due to AF with RVR.  Trend troponin Cath 09/2013 with patent stent  3.  Morbid obesity Weight loss encouraged She is motivated to lose weight - will refer to AF clinic nutrition program Her son owns a pool and she is willing to try water aerobics Walking is difficult as she is s/p bilateral TKR  4.  Sleep apnea Continue CPAP - the patient reports compliance  Signed, Gypsy Balsam, NP 06/25/2014 2:40 PM  I have seen, examined the patient, and reviewed the above assessment and plan.  On exam RRR. Changes to above are made where necessary.  She has failed tikosyn.  Will start amiodarone today.  Cath Aug 2015 reviewed.  Presently, I do not think that additional ischemic workup is required.  Anticipate discharge to home in am.    Co Sign: Hillis Range, MD 06/25/2014 5:46 PM

## 2014-06-25 NOTE — Progress Notes (Signed)
CRITICAL VALUE ALERT  Critical value received:  Troponin 2.21  Date of notification:  06/25/14  Time of notification:  Not notified  Critical value read back:Yes.    Nurse who received alert:  Abe People   MD notified (1st page): Sulaiman  Time of first page:  [phone]  MD notified (2nd page):   Time of second page:  Responding MD:  Army Melia  Time MD responded:  [phone]

## 2014-06-25 NOTE — ED Notes (Signed)
Appointment time 16:10p

## 2014-06-26 ENCOUNTER — Other Ambulatory Visit: Payer: Self-pay | Admitting: Physician Assistant

## 2014-06-26 ENCOUNTER — Encounter (HOSPITAL_COMMUNITY): Payer: Self-pay | Admitting: Physician Assistant

## 2014-06-26 DIAGNOSIS — I480 Paroxysmal atrial fibrillation: Secondary | ICD-10-CM | POA: Diagnosis not present

## 2014-06-26 DIAGNOSIS — I5032 Chronic diastolic (congestive) heart failure: Secondary | ICD-10-CM | POA: Diagnosis not present

## 2014-06-26 DIAGNOSIS — Z79899 Other long term (current) drug therapy: Secondary | ICD-10-CM

## 2014-06-26 DIAGNOSIS — I248 Other forms of acute ischemic heart disease: Secondary | ICD-10-CM | POA: Diagnosis not present

## 2014-06-26 DIAGNOSIS — Z7901 Long term (current) use of anticoagulants: Secondary | ICD-10-CM | POA: Diagnosis not present

## 2014-06-26 LAB — LIPID PANEL
CHOL/HDL RATIO: 6 ratio
Cholesterol: 199 mg/dL (ref 0–200)
HDL: 33 mg/dL — AB (ref >40)
LDL CALC: 135 mg/dL — AB (ref 0–99)
TRIGLYCERIDES: 156 mg/dL — AB (ref <150)
VLDL: 31 mg/dL (ref 0–40)

## 2014-06-26 LAB — COMPREHENSIVE METABOLIC PANEL
ALBUMIN: 3.1 g/dL — AB (ref 3.5–5.0)
ALT: 34 U/L (ref 14–54)
ANION GAP: 8 (ref 5–15)
AST: 50 U/L — ABNORMAL HIGH (ref 15–41)
Alkaline Phosphatase: 65 U/L (ref 38–126)
BUN: 18 mg/dL (ref 6–20)
CO2: 28 mmol/L (ref 22–32)
Calcium: 9 mg/dL (ref 8.9–10.3)
Chloride: 102 mmol/L (ref 101–111)
Creatinine, Ser: 1.17 mg/dL — ABNORMAL HIGH (ref 0.44–1.00)
GFR calc Af Amer: 53 mL/min — ABNORMAL LOW (ref >60)
GFR, EST NON AFRICAN AMERICAN: 46 mL/min — AB (ref >60)
Glucose, Bld: 107 mg/dL — ABNORMAL HIGH (ref 65–99)
POTASSIUM: 3.8 mmol/L (ref 3.5–5.1)
Sodium: 138 mmol/L (ref 135–145)
TOTAL PROTEIN: 6.2 g/dL — AB (ref 6.5–8.1)
Total Bilirubin: 0.7 mg/dL (ref 0.3–1.2)

## 2014-06-26 LAB — PROTIME-INR
INR: 2.8 — ABNORMAL HIGH (ref 0.00–1.49)
Prothrombin Time: 29.1 seconds — ABNORMAL HIGH (ref 11.6–15.2)

## 2014-06-26 LAB — TROPONIN I
TROPONIN I: 2.11 ng/mL — AB (ref <0.031)
Troponin I: 1.88 ng/mL (ref <0.031)

## 2014-06-26 LAB — TSH: TSH: 3.968 u[IU]/mL (ref 0.350–4.500)

## 2014-06-26 MED ORDER — WARFARIN SODIUM 2.5 MG PO TABS: ORAL_TABLET | ORAL | Status: DC

## 2014-06-26 MED ORDER — AMIODARONE HCL 200 MG PO TABS: ORAL_TABLET | ORAL | Status: DC

## 2014-06-26 NOTE — Progress Notes (Signed)
PROGRESS NOTE  Subjective:   [name] is a 69 y.o. female with a history of LAD stent 2010, NSTEMI in the setting of rapid afib w/ non-obs dz by cath 2015, D-CHF, coumadin, HTN, HL and morbid obesity. She had chest pain and heart racing and came to the ER. Cardiology asked to admit.  She has been seen by EP.  Was started on amiodarone 400 BID Was on Tikosyn. Has episodes of bradycardia so Sotolol is not a good option . Has hx of CAD so Flecainide is not an option  She is in NSR  Ready to go home   Objective:    Vital Signs:   Temp:  [97.9 F (36.6 C)-98.9 F (37.2 C)] 98.1 F (36.7 C) (05/21 0500) Pulse Rate:  [47-72] 64 (05/21 0500) Resp:  [12-24] 18 (05/21 0500) BP: (106-141)/(41-68) 121/55 mmHg (05/21 0500) SpO2:  [94 %-100 %] 99 % (05/21 0500) Weight:  [128.141 kg (282 lb 8 oz)-128.549 kg (283 lb 6.4 oz)] 128.141 kg (282 lb 8 oz) (05/21 0500)  Last BM Date: 06/25/14   24-hour weight change: Weight change:   Weight trends: Filed Weights   06/25/14 1351 06/26/14 0500  Weight: 128.549 kg (283 lb 6.4 oz) 128.141 kg (282 lb 8 oz)    Intake/Output:  05/20 0701 - 05/21 0700 In: -  Out: 150 [Urine:150]     Physical Exam: BP 121/55 mmHg  Pulse 64  Temp(Src) 98.1 F (36.7 C) (Oral)  Resp 18  Ht 5\' 5"  (1.651 m)  Wt 128.141 kg (282 lb 8 oz)  BMI 47.01 kg/m2  SpO2 99%  Wt Readings from Last 3 Encounters:  06/26/14 128.141 kg (282 lb 8 oz)  04/22/14 128.822 kg (284 lb)  02/22/14 128.141 kg (282 lb 8 oz)    General: Vital signs reviewed and noted.   Head: Normocephalic, atraumatic.  Eyes: conjunctivae/corneas clear.  EOM's intact.   Throat: normal  Neck:  normal   Lungs:    clear   Heart:  RR  Abdomen:  Soft, non-tender, non-distended  Obese   Extremities:  no edema    Neurologic: A&O X3, CN II - XII are grossly intact.   Psych: Normal     Labs: BMET:  Recent Labs  06/25/14 1026 06/26/14 0445  NA 138 138  K 3.8 3.8  CL 102 102    CO2 26 28  GLUCOSE 109* 107*  BUN 17 18  CREATININE 1.28* 1.17*  CALCIUM 9.7 9.0    Liver function tests:  Recent Labs  06/26/14 0445  AST 50*  ALT 34  ALKPHOS 65  BILITOT 0.7  PROT 6.2*  ALBUMIN 3.1*   No results for input(s): LIPASE, AMYLASE in the last 72 hours.  CBC:  Recent Labs  06/25/14 1026  WBC 10.9*  HGB 14.6  HCT 44.2  MCV 83.6  PLT 263    Cardiac Enzymes:  Recent Labs  06/25/14 2009 06/25/14 2330 06/26/14 0445  TROPONINI 2.21* 2.11* 1.88*    Coagulation Studies:  Recent Labs  06/25/14 1026 06/26/14 0445  LABPROT 26.9* 29.1*  INR 2.53* 2.80*    Other: Invalid input(s): POCBNP No results for input(s): DDIMER in the last 72 hours. No results for input(s): HGBA1C in the last 72 hours.  Recent Labs  06/26/14 0445  CHOL 199  HDL 33*  LDLCALC 135*  TRIG 156*  CHOLHDL 6.0   No results for input(s): TSH, T4TOTAL, T3FREE, THYROIDAB in the last 72 hours.  Invalid input(s): FREET3 No results for input(s): VITAMINB12, FOLATE, FERRITIN, TIBC, IRON, RETICCTPCT in the last 72 hours.   Other results:  Tele   ( personally reviewed )  - NSR   Medications:    Infusions:    Scheduled Medications: . amiodarone  400 mg Oral BID  . furosemide  40 mg Oral Daily  . magnesium oxide  400 mg Oral QHS  . metoprolol  100 mg Oral BID  . potassium chloride  10 mEq Oral BID  . sodium chloride  3 mL Intravenous Q12H  . warfarin  2.5 mg Oral q1800  . Warfarin - Physician Dosing Inpatient   Does not apply q1800    Assessment/ Plan:   Principal Problem:   PAF (paroxysmal atrial fibrillation) Active Problems:   Obesity, morbid   Essential hypertension   Sleep apnea - on CPAP   Chronic anticoagulation   Chronic diastolic CHF (congestive heart failure)   Demand ischemia  1. Paroxysmal atrial fibrillation: The patient has failed Tikosyn. She was started on amiodarone. Will have her take 400 BID for 1 week, then 200 BID for 2 weeks, then 200  mg a day. Will follow up with Dr. Johney Frame and Dr. Antoine Poche / PA  in several weeks ,  Will need to have her INR checked soon since she is starting amiodarone   will have her hold coumadin today  Take 2. 5 mg coumadin tomorrow ( Sunday ) and call the coumadin clinic on Monday for further instructions. She will not require as much coumadin now that she is on amiodarone .   Will need PFTs and eye exam exam   2. Troponin elevation: I reviewed her labs and her troponin appears to be chronically elevated. It was elevated when she was admitted in August 2015. She had an unremarkable cardiac catheterization at that time. It's possible that she's having demand ischemia with her episodes of atrial fibrillation. It would be interesting to measure a troponin level when she is at the office and not having significant episodes of atrial fibrillation. At this point I do not think that she's having myocardial infarction and I think that it is safe for her to be discharged.  She has NS ST abn on her ECG at baseline .   3. Obesity:  Encouraged her to lose weight.  Should see a nutritionist   4. CAD - s/p stenting in 2010.    5. Diastolic CHF - well compensated  6. OSA :  Encouraged weight loss  Disposition: dc to home today  Length of Stay:   Alvia Grove., MD, [facility] 06/26/2014, 8:15 AM Office [phone] Pager [phone]

## 2014-06-26 NOTE — Discharge Summary (Signed)
Discharge Summary   Patient ID: [name] MRN: [id number], DOB/AGE: 09-05-1945 69 y.o. Admit date: 06/25/2014 D/C date:     06/26/2014  Primary Care Provider: Ruthe Mannan, MD Primary Cardiologist: [name]/[name]  Primary Discharge Diagnoses:  1. Paroxysmal atrial fibrillation - Tikosyn stopped, amiodarone started 2. Elevated troponin, possibly related to demand ischemia 3. Morbid obesity Body mass index is 47.01 kg/(m^2). 4. CAD s/p prior PCI 5. Chronic diastolic CHF 6. OSA on CPAP 7. Mild renal insufficiency Cr 1.17 at discharge - baseline Cr appears 1.1-1.2  Comprehensive PMH:  Past Medical History  Diagnosis Date  . Hyperlipidemia   . Hypertension   . Osteoporosis   . PAF (paroxysmal atrial fibrillation)     a. s/p multiple cardioversions;  b. 2010: on Tikosyn - dose decreased from to in setting of NSTEMI/QT prolongation. Did not hold NSR. c. admitted 08/2011 with loading at BID with DCCV at that time;  d. Chronic coumadin (CHA2DS2VASc = 5). e. 06/2014: Joice Lofts stopped due to ineffective; started on amiodarone.  [name] CAD (coronary artery disease)     a. 07/2008 NSTEMI ->Cath: LM nl, LAD 70p/90p, 58m, LCX nl, RCA nl, EF 60%.  The LAD was stented with a 2.25x52mm Veri-Flex BMS.  b. 8/15 cath patent stent, nonobstructive disease otherwise - unchanged. Elevated troponin felt due to demand ischemia in setting of AF-RVR. c. Elevated trop 06/2014 felt due to demand ischemia.  . Duodenitis   . Headache(784.0)   . Chronic diastolic CHF (congestive heart failure)     a. 07/2008 Echo: EF 60-65%, Triv AI/MR, Mild TR;  b. 09/2013 Echo: EF 60-65%, mild conc LVH.  . Morbid obesity   . Difficult intubation   . History of positive PPD   . Arthritis   . Renal insufficiency     a. Baseline Cr appears 1.1-1.2.     Hospital Course: [name] is a 69 y/o F with history of CAD (LAD stent 2010), NSTEMI in the setting of rapid afib w/ non-obs dz by cath 2015, chronic diastolic CHF,  chronic Coumadin, HTN, HL and morbid obesity who presented to [facility] with recurrent AF. She has been compliant with her medications including Tikosyn and Coumadin. She also uses her CPap every night. She came to the hospital because of increasing frequency of episodes of atrial fibrillation associated with chest tightness, not particularly responsive to her PRN metoprolol. When in sinus rhythm, her HR was 50. She reported chronic unchanged DOE. She has had weight gain but admitted to overeating lately.   Due to increasing frequency of AF, she came to the ER for further evaluation at the advice of the office. It was felt that she has failed Tikosyn given recurrent episodes. Troponin was elevated, felt possibly due to demand ischemia (up to 2.2). She also had Cr 1.28 on admission. WBC 10.9, afebrile. Last echo 09/2013 demonstrated EF 60-65%, mild concentric hypertrophy, LA 40. Dr. Johney [name] saw the patient and agreed Tikosyn should be discontinued. With her obesity, ablation success rates are decreased.She is not a candidate for Flecainide with CAD; Sotalol less than ideal with bradycardia. Dr. Johney [name] recommended to start amiodarone. Risks of this medication were discussed. Coumadin was continued but dose was adjusted given amiodarone initiation. Weight loss was encouraged given morbid obesity. Dr. Johney [name] referred her to the AF clinic nutrition program. He did not think additional ischemic workup was required. Dr. Elease [name] felt that it may be interesting to measure a troponin level when she is  at the office and not having significant episodes of atrial fibrillation to see what it is at baseline, as it appears to be chronically elevated (even in the setting of cath in 09/2013 that was unremarkable with patent stent). The patient feels well today. Dr. Elease [name] has seen and examined the patient today and feels she is stable for discharge. She remains in NSR.  Baseline LFTs, TSH pending at time of discharge. Have  sent message to scheduling to refer for baseline PFTs and eye exam. Amio dosing per Dr. Elease [name] - Will have her take 400 BID for 1 week, then 200 BID for 2 weeks, then 200 mg a day. She was advised to hold Coumadin today, take 2.5mg  tomorrow, and call clinic on Monday for Coumadin check. I also included this in a high priority staff message to our schedulers.   Discharge Vitals: Blood pressure 134/57, pulse 62, temperature 98.1 F (36.7 C), temperature source Oral, resp. rate 18, height  (1.651 m), weight 282 lb 8 oz (128.141 kg), SpO2 99 %.  Labs: Lab Results  Component Value Date   WBC 10.9* 06/25/2014   HGB 14.6 06/25/2014   HCT 44.2 06/25/2014   MCV 83.6 06/25/2014   PLT 263 06/25/2014    Recent Labs Lab 06/26/14 0445  NA 138  K 3.8  CL 102  CO2 28  BUN 18  CREATININE 1.17*  CALCIUM 9.0  PROT 6.2*  BILITOT 0.7  ALKPHOS 65  ALT 34  AST 50*  GLUCOSE 107*    Recent Labs  06/25/14 2009 06/25/14 2330 06/26/14 0445  TROPONINI 2.21* 2.11* 1.88*   Lab Results  Component Value Date   CHOL 199 06/26/2014   HDL 33* 06/26/2014   LDLCALC 135* 06/26/2014   TRIG 156* 06/26/2014   No results found for: DDIMER  Diagnostic Studies/Procedures   Dg Chest Port 1 View  06/25/2014   CLINICAL DATA:  Two episodes of atrial fibrillation, chest pain, history hypertension, CHF, hyperlipidemia, coronary artery disease  EXAM: PORTABLE CHEST - 1 VIEW  COMPARISON:  Portable exam 1417 hours compared to 09/22/2013  FINDINGS: Enlargement of cardiac silhouette.  Atherosclerotic calcification aorta.  Minimal pulmonary vascular congestion.  No acute infiltrate, pleural effusion or pneumothorax.  Minimal atelectasis at RIGHT base.  Bones mildly demineralized.  IMPRESSION: Enlargement of cardiac silhouette with pulmonary vascular congestion.  Minimal RIGHT basilar infiltrate without acute infiltrate or edema.   Electronically Signed   By: [name] M.D.   On: 06/25/2014 14:23    Discharge  Medications   Current Discharge Medication List    START taking these medications   Details  amiodarone (PACERONE) 200 MG tablet Take 2 tablets by mouth twice a day for 1 week, then 1 tablet twice a day for 2 weeks, then 1 tablet daily. Qty: 70 tablet, Refills: 0      CONTINUE these medications which have CHANGED   Details  warfarin (COUMADIN) 2.5 MG tablet Hold Coumadin today. Take 1 tablet (2.5mg ) tomorrow (Sunday 06/27/14). Call the office on Monday 06/28/14 to request further instructions on dosing.      CONTINUE these medications which have NOT CHANGED   Details  acetaminophen (TYLENOL) 500 MG tablet Take 1,000 mg by mouth every 6 (six) hours as needed for headache.    furosemide (LASIX) 40 MG tablet Take 1 tablet (40 mg total) by mouth daily.     magnesium oxide (MAG-OX) 400 MG tablet Take 1 tablet (400 mg total) by mouth daily.  metoprolol (LOPRESSOR) 100 MG tablet Take 1 tablet (100 mg total) by mouth 2 (two) times daily.     nitroGLYCERIN (NITROSTAT) 0.4 MG SL tablet Place 1 tablet (0.4 mg total) under the tongue every 5 (five) minutes x 3 doses as needed for chest pain.     potassium chloride (K-DUR,KLOR-CON) 10 MEQ tablet Take 1 tablet (10 mEq total) by mouth 2 (two) times daily.    Associated Diagnoses: Chronic diastolic CHF (congestive heart failure)    atorvastatin (LIPITOR) 40 MG tablet Take 1 tablet (40 mg total) by mouth daily at 6 PM.       STOP taking these medications     dofetilide (TIKOSYN) 500 MCG capsule         Disposition   The patient will be discharged in stable condition to home. Discharge Instructions    Diet - low sodium heart healthy    Complete by:  As directed      Increase activity slowly    Complete by:  As directed   Tikosyn has been stopped.  Please see new medication instructions on amiodarone and Coumadin.          Follow-up Information    Follow up with [facility] On 07/02/2014.   Why:  at 9:30AM   Contact  information:   [address] [place] [address] [phone]      Follow up with [facility].   Specialty:  Cardiology   Why:  Please call the office on Monday 06/28/14 to request that your Coumadin level be checked.   Contact information:   [address] [place] [address] [phone]      Follow up with [facility].   Specialty:  Cardiology   Why:  Office will call you to arrange baseline pulmonary function tests and eye exam since you are starting amiodarone.   Contact information:   [address] [place] [address] [phone]        Duration of Discharge Encounter: Greater than 30 minutes including physician and PA time.  Thomasene Mohair PA-C 06/26/2014, 11:16 AM   Attending Note:   The patient was seen and examined.  Agree with assessment and plan as noted above.  Changes made to the above note as needed.  See my progress note from 5/21. Patient is ready for DC   Alvia Grove., MD, [facility] 06/28/2014, 8:10 AM [address]  [address] Office [phone] Pager [phone]

## 2014-06-28 ENCOUNTER — Telehealth: Payer: Self-pay | Admitting: Internal Medicine

## 2014-06-28 NOTE — Telephone Encounter (Signed)
Pt was actually calling to ask if she can keep Coumadin appt for tomorrow-staff message from [facility] said to make appt today-she wants to know if fit's ok to wait until 1130am appt 06-29-14

## 2014-06-28 NOTE — Telephone Encounter (Signed)
New problem   Pt need to speak to coumadin concerning a new medication she was put on. Please call pt.

## 2014-06-28 NOTE — Telephone Encounter (Signed)
Spoke with pt she was in hosp from 5/20 to 5/21. Amiodarone started this admission see dosing. Pt was instructed to skip Coumadin on Saturday at discharge, INR at admission and discharge in range. Pt informed that she can keep her appt on tomorrow.

## 2014-06-29 ENCOUNTER — Ambulatory Visit (INDEPENDENT_AMBULATORY_CARE_PROVIDER_SITE_OTHER): Payer: Medicare Other | Admitting: Surgery

## 2014-06-29 DIAGNOSIS — Z7901 Long term (current) use of anticoagulants: Secondary | ICD-10-CM

## 2014-06-29 DIAGNOSIS — I480 Paroxysmal atrial fibrillation: Secondary | ICD-10-CM | POA: Diagnosis not present

## 2014-06-29 DIAGNOSIS — I4891 Unspecified atrial fibrillation: Secondary | ICD-10-CM

## 2014-06-29 DIAGNOSIS — Z5181 Encounter for therapeutic drug level monitoring: Secondary | ICD-10-CM

## 2014-06-29 LAB — POCT INR: INR: 2.9

## 2014-07-02 ENCOUNTER — Ambulatory Visit (HOSPITAL_COMMUNITY)
Admission: RE | Admit: 2014-07-02 | Discharge: 2014-07-02 | Disposition: A | Discharge status: Home / Self Care | Payer: Medicare Other | Source: Ambulatory Visit | Attending: Nurse Practitioner | Admitting: Nurse Practitioner

## 2014-07-02 ENCOUNTER — Other Ambulatory Visit (HOSPITAL_COMMUNITY): Payer: Self-pay | Admitting: *Deleted

## 2014-07-02 ENCOUNTER — Encounter (HOSPITAL_COMMUNITY): Payer: Self-pay | Admitting: Nurse Practitioner

## 2014-07-02 VITALS — BP 140/84 | HR 51 | Ht 65.0 in | Wt 288.2 lb

## 2014-07-02 DIAGNOSIS — I481 Persistent atrial fibrillation: Secondary | ICD-10-CM | POA: Insufficient documentation

## 2014-07-02 DIAGNOSIS — I4819 Other persistent atrial fibrillation: Secondary | ICD-10-CM

## 2014-07-02 MED ORDER — METOPROLOL TARTRATE 50 MG PO TABS
75.0000 mg | ORAL_TABLET | Freq: Two times a day (BID) | ORAL | Status: DC
Start: 2014-07-02 — End: 2014-07-02

## 2014-07-02 MED ORDER — METOPROLOL TARTRATE 50 MG PO TABS: 75.0000 mg | ORAL_TABLET | Freq: Two times a day (BID) | ORAL | Status: DC

## 2014-07-02 NOTE — Progress Notes (Signed)
Patient ID: [name], female   DOB: 01/20/1946, 69 y.o.   MRN: [id number]     Primary Care Physician: Ruthe Mannan, MD Referring Physician: [facility] f/u Cariologist: Dr. Antoine Poche Electrophysiologist: Dr. Hetty Ely is a 69 y.o. female with a h/o 69 y/o F with history of CAD (LAD stent 2010), NSTEMI in the setting of rapid afib w/ non-obs dz by cath 2015, chronic diastolic CHF, chronic Coumadin, HTN, HL and morbid obesity who presented to [facility] with recurrent AF. She has been compliant with her medications including Tikosyn and Coumadin. She also uses her CPap every night. She came to the hospital because of increasing frequency of episodes of atrial fibrillation associated with chest tightness, not particularly responsive to her PRN metoprolol. When in sinus rhythm, her HR was 50. She reported chronic unchanged DOE.   She reports no further afib since d/c. Has had 6 lb weight gain. Not showing too much dietary discretion with salty foods. Drinks many pepsi's during the day. Information given for dietary class next Friday. Herat rate slow at 51 today with normal intervals. Recent INR 2.9.  Today, she denies symptoms of palpitations, chest pain, shortness of breath, orthopnea, PND, lower extremity edema, dizziness, presyncope, syncope, or neurologic sequela. The patient is tolerating medications without difficulties and is otherwise without complaint today.   Past Medical History  Diagnosis Date  . Hyperlipidemia   . Hypertension   . Osteoporosis   . PAF (paroxysmal atrial fibrillation)     a. s/p multiple cardioversions;  b. 2010: on Tikosyn - dose decreased from to in setting of NSTEMI/QT prolongation. Did not hold NSR. c. admitted 08/2011 with loading at BID with DCCV at that time;  d. Chronic coumadin (CHA2DS2VASc = 5). e. 06/2014: Joice Lofts stopped due to ineffective; started on amiodarone.  [name] CAD (coronary artery disease)     a. 07/2008 NSTEMI  ->Cath: LM nl, LAD 70p/90p, 50m, LCX nl, RCA nl, EF 60%.  The LAD was stented with a 2.25x63mm Veri-Flex BMS.  b. 8/15 cath patent stent, nonobstructive disease otherwise - unchanged. Elevated troponin felt due to demand ischemia in setting of AF-RVR. c. Elevated trop 06/2014 felt due to demand ischemia.  . Duodenitis   . Headache(784.0)   . Chronic diastolic CHF (congestive heart failure)     a. 07/2008 Echo: EF 60-65%, Triv AI/MR, Mild TR;  b. 09/2013 Echo: EF 60-65%, mild conc LVH.  . Morbid obesity   . Difficult intubation   . History of positive PPD   . Arthritis   . Renal insufficiency     a. Baseline Cr appears 1.1-1.2.   Past Surgical History  Procedure Laterality Date  . Cholecystectomy    . Abdominal hysterectomy      partial, bleeding  . Knee arthroscopy  2000 & 2001    left and right  . Esophagogastroduodenoscopy  2003  . Coronary stent placement    . Tubal ligation    . Total knee arthroplasty      bilateral  . Left heart catheterization with coronary angiogram N/A 09/24/2013    Procedure: LEFT HEART CATHETERIZATION WITH CORONARY ANGIOGRAM;  Surgeon: Micheline Chapman, MD;  [facility] [place], LAD stent patent, mLAD 50-60%, CFX OK, RCA 30-40%, EF 70%  . Coronary angioplasty with stent placement  2010    2.75- x 20-mm VeriFlex DES to the pLAD    Current Outpatient Prescriptions  Medication Sig Dispense Refill  . acetaminophen (TYLENOL) 500  MG tablet Take 1,000 mg by mouth every 6 (six) hours as needed for headache.    [name] amiodarone (PACERONE) 200 MG tablet Take 2 tablets by mouth twice a day for 1 week, then 1 tablet twice a day for 2 weeks, then 1 tablet daily. 70 tablet 0  . furosemide (LASIX) 40 MG tablet Take 1 tablet (40 mg total) by mouth daily. 30 tablet 6  . magnesium oxide (MAG-OX) 400 MG tablet Take 1 tablet (400 mg total) by mouth daily. 90 tablet 3  . nitroGLYCERIN (NITROSTAT) 0.4 MG SL tablet Place 1 tablet (0.4 mg total) under the tongue every 5 (five) minutes x 3 doses  as needed for chest pain. 25 tablet 12  . potassium chloride (K-DUR,KLOR-CON) 10 MEQ tablet Take 1 tablet (10 mEq total) by mouth 2 (two) times daily. 180 tablet 2  . warfarin (COUMADIN) 2.5 MG tablet Hold Coumadin today. Take 1 tablet (2.5mg ) tomorrow (Sunday 06/27/14). Call the office on Monday 06/28/14 to request further instructions on dosing.    . metoprolol (LOPRESSOR) 50 MG tablet Take 1.5 tablets (75 mg total) by mouth 2 (two) times daily. 90 tablet 3   No current facility-administered medications for this encounter.    Allergies  Allergen Reactions  . Rosuvastatin Other (See Comments)    REACTION: muscle and joint pain  . Statins Other (See Comments)    intolerance    History   Social History  . Marital Status: Widowed    Spouse Name: N/A  . Number of Children: 2  . Years of Education: N/A   Occupational History  . retired    Social History Main Topics  . Smoking status: Never Smoker   . Smokeless tobacco: Never Used  . Alcohol Use: No  . Drug Use: No  . Sexual Activity: Not Currently    Birth Control/ Protection: Post-menopausal   Other Topics Concern  . Not on file   Social History Narrative   Lives in [place], [place] w/ husband.    Family History  Problem Relation Age of Onset  . Other Mother     GI Bleed  . Pneumonia Father   . Coronary artery disease Brother   . Breast cancer Paternal Grandmother   . Rectal cancer Paternal Grandfather     ROS- All systems are reviewed and negative except as per the HPI above  Physical Exam: Filed Vitals:   07/02/14 0935  BP: 140/84  Pulse: 51  Height:  (1.651 m)  Weight: 288 lb 3.2 oz (130.727 kg)    GEN- The patient is well appearing, alert and oriented x 3 today.   Head- normocephalic, atraumatic Eyes-  Sclera clear, conjunctiva pink Ears- hearing intact Oropharynx- clear Neck- supple, no JVP Lymph- no cervical lymphadenopathy Lungs- Clear to ausculation bilaterally, normal work of  breathing Heart-  Sow, regular rate and rhythm, no murmurs, rubs or gallops, PMI not laterally displaced GI- soft, NT, ND, + BS Extremities- no clubbing, cyanosis, or edema MS- no significant deformity or atrophy Skin- no rash or lesion Psych- euthymic mood, full affect Neuro- strength and sensation are intact  EKG-Sinus brady at 51, PR int 174 bpm, QRS 84 ms, QTc, 392 ms.  Assessment and Plan:  1. Persistent afib Maintaining SR since amiodarone started  Taking 200 mg bid and is aware when to reduce to 200 mg daily Is to contact [facility] to be set up for PFT/eye exam  2. Bradycardia Decrease metoprolol to 75 mg bid Pt will check  v/s and let me know effect next week  3. Chadsvasc score of least 4 INR's being checked more frequently with the addition of amiodarone Has another appointment pending in one week.  4. Diastolic heart failure Weight is up 6 pounds Increase lasix to 60 mg daily x 3 days Daily weights Info on free dietary class give Instructed to reduce sodium in the diet  F/u with Dr. Johney Frame as scheduled 6/24

## 2014-07-02 NOTE — Patient Instructions (Addendum)
Your physician has recommended you make the following change in your medication:  1)Increase lasix to 60mg  (1 1/2 tablets) for next 3 days and then decrease back to normal dose of 40mg  once a day 2)Decrease Metoprolol to 75mg  Twice a day   Dietary class next Friday, June 10th at noon in heart and vascular conference   Follow up with Dr. Johney Frame as scheduled.

## 2014-07-06 ENCOUNTER — Ambulatory Visit (INDEPENDENT_AMBULATORY_CARE_PROVIDER_SITE_OTHER): Payer: Medicare Other | Admitting: Pharmacist

## 2014-07-06 DIAGNOSIS — Z5181 Encounter for therapeutic drug level monitoring: Secondary | ICD-10-CM

## 2014-07-06 DIAGNOSIS — I480 Paroxysmal atrial fibrillation: Secondary | ICD-10-CM | POA: Diagnosis not present

## 2014-07-06 DIAGNOSIS — I4891 Unspecified atrial fibrillation: Secondary | ICD-10-CM | POA: Diagnosis not present

## 2014-07-06 DIAGNOSIS — Z7901 Long term (current) use of anticoagulants: Secondary | ICD-10-CM | POA: Diagnosis not present

## 2014-07-06 LAB — POCT INR: INR: 2.4

## 2014-07-20 ENCOUNTER — Ambulatory Visit (INDEPENDENT_AMBULATORY_CARE_PROVIDER_SITE_OTHER): Payer: Medicare Other

## 2014-07-20 DIAGNOSIS — I480 Paroxysmal atrial fibrillation: Secondary | ICD-10-CM

## 2014-07-20 DIAGNOSIS — Z7901 Long term (current) use of anticoagulants: Secondary | ICD-10-CM | POA: Diagnosis not present

## 2014-07-20 DIAGNOSIS — Z5181 Encounter for therapeutic drug level monitoring: Secondary | ICD-10-CM

## 2014-07-20 DIAGNOSIS — I4891 Unspecified atrial fibrillation: Secondary | ICD-10-CM

## 2014-07-20 LAB — POCT INR: INR: 2.3

## 2014-07-20 IMAGING — CR DG CHEST 1V PORT
1 series · 1 of 1 positions shown · non-contrast
Comparison: 06/27/2006

CLINICAL DATA: Chest pain. Shortness of breath. Atrial
fibrillation.

EXAM:
PORTABLE CHEST - 1 VIEW

[AP]
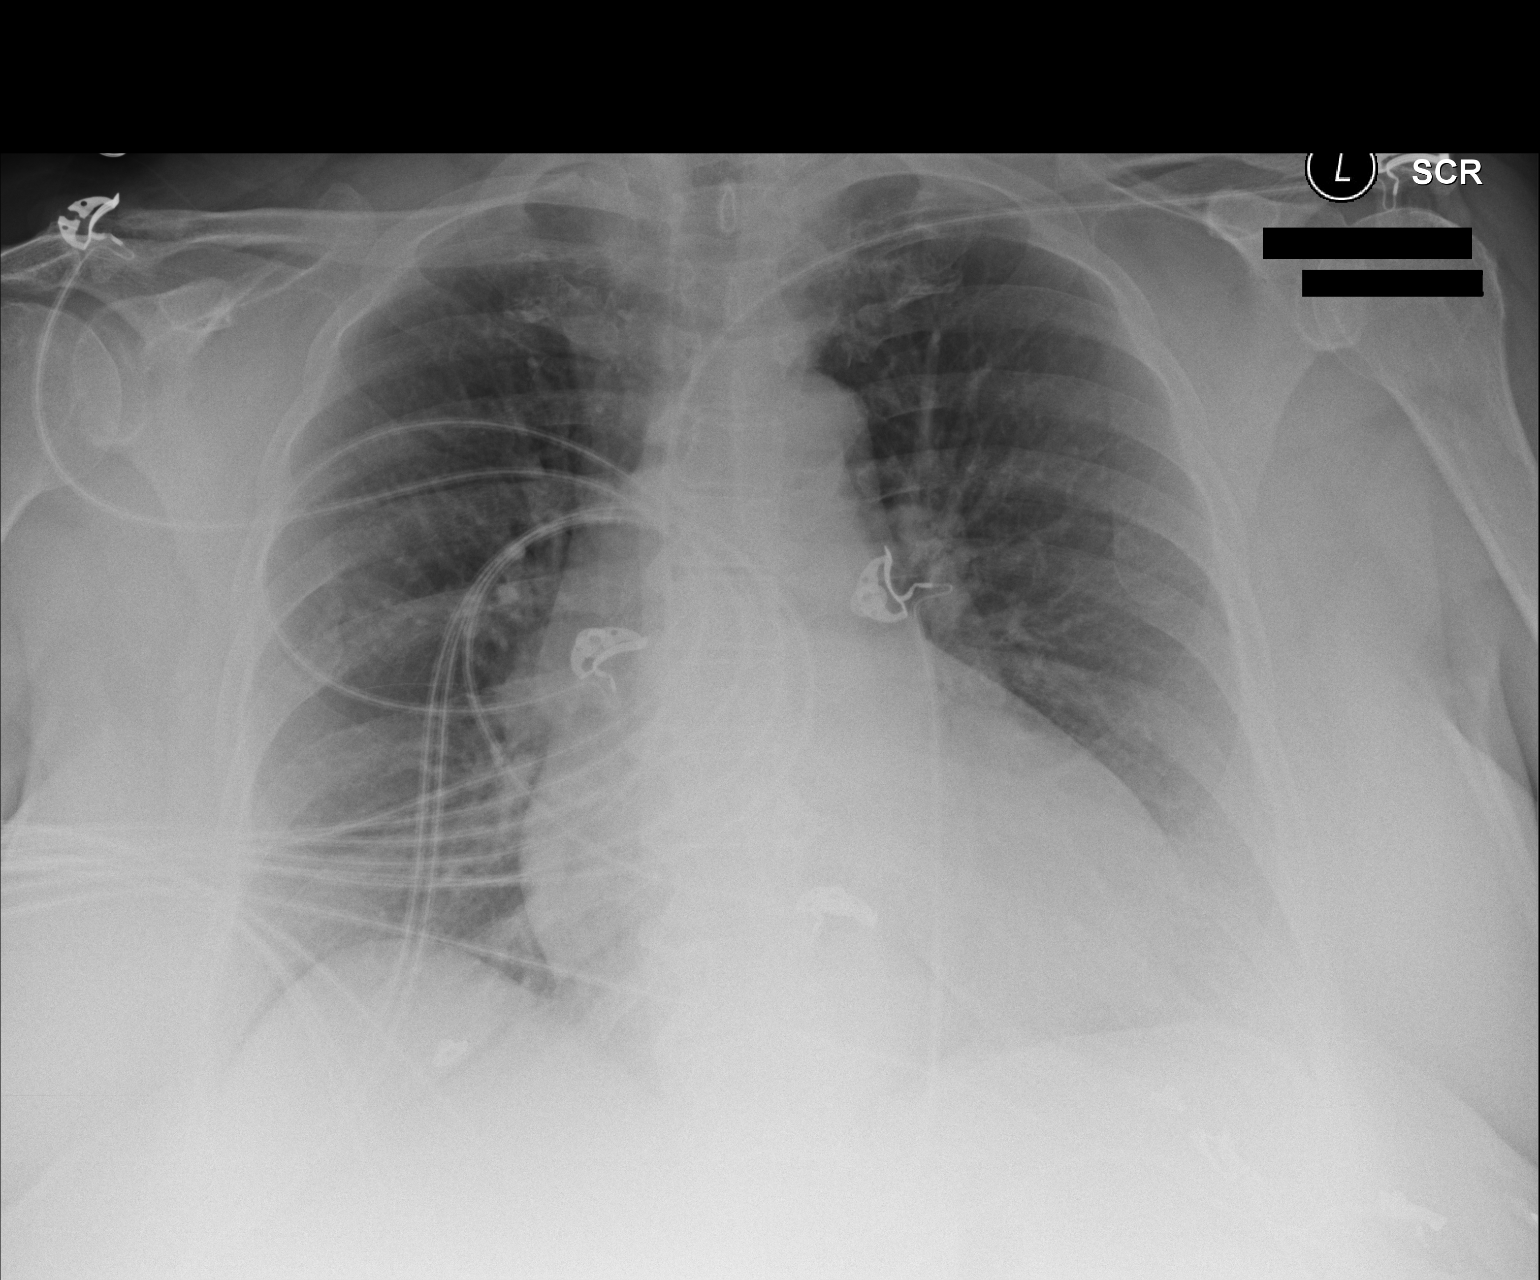

[1 of 1 positions shown; findings below may reference images not displayed]

FINDINGS: Mild cardiomegaly remains stable. Both lungs are clear. No evidence
of pleural effusion. No mass or lymphadenopathy identified.
IMPRESSION: Stable cardiomegaly.  No active lung disease.

## 2014-07-26 ENCOUNTER — Encounter: Payer: Self-pay | Admitting: *Deleted

## 2014-07-26 ENCOUNTER — Telehealth: Payer: Self-pay | Admitting: *Deleted

## 2014-07-26 NOTE — Telephone Encounter (Signed)
Pt returned my call, she said at her age she declines to get them, chart updated

## 2014-07-26 NOTE — Telephone Encounter (Signed)
F/u mammogram call: left voicemail requesting pt to call office back  Pt hasn't had recent mammogram on file in over 2 years. I need to ask pt if she has had one, has one scheduled, needs to schedule one or refuses to get them

## 2014-07-30 ENCOUNTER — Other Ambulatory Visit: Payer: Self-pay | Admitting: Physician Assistant

## 2014-07-30 NOTE — Telephone Encounter (Signed)
07-30-14 859 am called to confirm pt's appt-wants to request refill today because she will be out on Sunday-called to [facility]

## 2014-08-02 ENCOUNTER — Ambulatory Visit (INDEPENDENT_AMBULATORY_CARE_PROVIDER_SITE_OTHER): Payer: Medicare Other | Admitting: Primary Care

## 2014-08-02 ENCOUNTER — Encounter: Payer: Self-pay | Admitting: Internal Medicine

## 2014-08-02 ENCOUNTER — Encounter: Payer: Self-pay | Admitting: Primary Care

## 2014-08-02 ENCOUNTER — Telehealth: Payer: Self-pay | Admitting: *Deleted

## 2014-08-02 ENCOUNTER — Ambulatory Visit (INDEPENDENT_AMBULATORY_CARE_PROVIDER_SITE_OTHER): Payer: Medicare Other | Admitting: Internal Medicine

## 2014-08-02 ENCOUNTER — Ambulatory Visit (INDEPENDENT_AMBULATORY_CARE_PROVIDER_SITE_OTHER): Payer: Medicare Other | Admitting: *Deleted

## 2014-08-02 VITALS — BP 132/78 | HR 68 | Ht 65.0 in | Wt 285.2 lb

## 2014-08-02 VITALS — BP 128/78 | HR 74 | Temp 98.1°F | Ht 65.0 in | Wt 286.4 lb

## 2014-08-02 DIAGNOSIS — I481 Persistent atrial fibrillation: Secondary | ICD-10-CM

## 2014-08-02 DIAGNOSIS — Z7901 Long term (current) use of anticoagulants: Secondary | ICD-10-CM

## 2014-08-02 DIAGNOSIS — E669 Obesity, unspecified: Secondary | ICD-10-CM | POA: Diagnosis not present

## 2014-08-02 DIAGNOSIS — I480 Paroxysmal atrial fibrillation: Secondary | ICD-10-CM | POA: Diagnosis not present

## 2014-08-02 DIAGNOSIS — I2510 Atherosclerotic heart disease of native coronary artery without angina pectoris: Secondary | ICD-10-CM

## 2014-08-02 DIAGNOSIS — I4891 Unspecified atrial fibrillation: Secondary | ICD-10-CM

## 2014-08-02 DIAGNOSIS — R350 Frequency of micturition: Secondary | ICD-10-CM

## 2014-08-02 DIAGNOSIS — Z5181 Encounter for therapeutic drug level monitoring: Secondary | ICD-10-CM

## 2014-08-02 DIAGNOSIS — I4819 Other persistent atrial fibrillation: Secondary | ICD-10-CM

## 2014-08-02 LAB — POCT URINALYSIS DIPSTICK
BILIRUBIN UA: NEGATIVE
GLUCOSE UA: NEGATIVE
KETONES UA: NEGATIVE
Leukocytes, UA: NEGATIVE
NITRITE UA: NEGATIVE
PH UA: 6
Spec Grav, UA: >=1.03
Urobilinogen, UA: NEGATIVE

## 2014-08-02 LAB — POCT INR: INR: 2.9

## 2014-08-02 NOTE — Patient Instructions (Addendum)
Your urine does not show infection. You do have some blood in your urine.  Please return in one week for a repeat urine sample. Call if you develop symptoms of burning, visual blood, mid back pain (left and right), increased frequency.  It was nice meeting you!

## 2014-08-02 NOTE — Patient Instructions (Signed)
Medication Instructions:  Your physician recommends that you continue on your current medications as directed. Please refer to the Current Medication list given to you today.   Labwork: Will need to follow up with PCP for urinary problems  Testing/Procedures: [facility] have been [facility]   Follow-Up: Your physician recommends that you schedule a follow-up appointment today at 3:00 with [name] Reel, NP  Your physician recommends that you schedule a follow-up appointment in: 4 weeks with [name] Coco, NP    Any Other Special Instructions Will Be Listed Below (If Applicable).

## 2014-08-02 NOTE — Telephone Encounter (Signed)
orders

## 2014-08-02 NOTE — Progress Notes (Signed)
Pre visit review using our clinic review tool, if applicable. No additional management support is needed unless otherwise documented below in the visit note. 

## 2014-08-02 NOTE — Progress Notes (Signed)
Subjective:    Patient ID: [name], female    DOB: Jun 09, 1945, 69 y.o.   MRN: [id number]  HPI  [name] is a 69 year old female who presents today with a chief complaint of urinary frequency and pelvic pressure. Her symptoms began this morning and have not worsened. Denies hematuria, fevers, vaginal discharge. She's not taken anything OTC for her symptoms. She will have occasional incontinence which is not a change from her norm.  Review of Systems  Constitutional: Negative for fever and chills.  Respiratory: Negative for shortness of breath.   Cardiovascular: Negative for chest pain.  Gastrointestinal: Negative for abdominal pain.  Genitourinary: Positive for frequency and difficulty urinating. Negative for dysuria, urgency, flank pain, vaginal bleeding and vaginal discharge.       Pelvic pressure.  Neurological: Negative for dizziness.       Past Medical History  Diagnosis Date  . Hyperlipidemia   . Hypertension   . Osteoporosis   . PAF (paroxysmal atrial fibrillation)     a. s/p multiple cardioversions;  b. 2010: on Tikosyn - dose decreased from to in setting of NSTEMI/QT prolongation. Did not hold NSR. c. admitted 08/2011 with loading at BID with DCCV at that time;  d. Chronic coumadin (CHA2DS2VASc = 5). e. 06/2014: Joice Lofts stopped due to ineffective; started on amiodarone.  [name] CAD (coronary artery disease)     a. 07/2008 NSTEMI ->Cath: LM nl, LAD 70p/90p, 18m, LCX nl, RCA nl, EF 60%.  The LAD was stented with a 2.25x47mm Veri-Flex BMS.  b. 8/15 cath patent stent, nonobstructive disease otherwise - unchanged. Elevated troponin felt due to demand ischemia in setting of AF-RVR. c. Elevated trop 06/2014 felt due to demand ischemia.  . Duodenitis   . Headache(784.0)   . Chronic diastolic CHF (congestive heart failure)     a. 07/2008 Echo: EF 60-65%, Triv AI/MR, Mild TR;  b. 09/2013 Echo: EF 60-65%, mild conc LVH.  . Morbid obesity   . Difficult intubation   .  History of positive PPD   . Arthritis   . Renal insufficiency     a. Baseline Cr appears 1.1-1.2.    History   Social History  . Marital Status: Widowed    Spouse Name: N/A  . Number of Children: 2  . Years of Education: N/A   Occupational History  . retired    Social History Main Topics  . Smoking status: Never Smoker   . Smokeless tobacco: Never Used  . Alcohol Use: No  . Drug Use: No  . Sexual Activity: Not Currently    Birth Control/ Protection: Post-menopausal   Other Topics Concern  . Not on file   Social History Narrative   Lives in [place], [place] w/ husband.    Past Surgical History  Procedure Laterality Date  . Cholecystectomy    . Abdominal hysterectomy      partial, bleeding  . Knee arthroscopy  2000 & 2001    left and right  . Esophagogastroduodenoscopy  2003  . Coronary stent placement    . Tubal ligation    . Total knee arthroplasty      bilateral  . Left heart catheterization with coronary angiogram N/A 09/24/2013    Procedure: LEFT HEART CATHETERIZATION WITH CORONARY ANGIOGRAM;  Surgeon: Micheline Chapman, MD;  [facility] [place], LAD stent patent, mLAD 50-60%, CFX [place], RCA 30-40%, EF 70%  . Coronary angioplasty with stent placement  2010    2.75- x 20-mm  VeriFlex DES to the pLAD    Family History  Problem Relation Age of Onset  . Other Mother     GI Bleed  . Pneumonia Father   . Coronary artery disease Brother   . Breast cancer Paternal Grandmother   . Rectal cancer Paternal Grandfather     Allergies  Allergen Reactions  . Rosuvastatin Other (See Comments)    REACTION: muscle and joint pain  . Statins Other (See Comments)    Muscle and joint pain    Current Outpatient Prescriptions on File Prior to Visit  Medication Sig Dispense Refill  . acetaminophen (TYLENOL) 500 MG tablet Take 1,000 mg by mouth every 6 (six) hours as needed for headache.    [name] amiodarone (PACERONE) 200 MG tablet Take 200 mg by mouth daily.    . furosemide (LASIX) 40 MG tablet  Take 1 tablet (40 mg total) by mouth daily. 30 tablet 6  . magnesium oxide (MAG-OX) 400 MG tablet Take 1 tablet (400 mg total) by mouth daily. 90 tablet 3  . metoprolol (LOPRESSOR) 50 MG tablet Take 1.5 tablets (75 mg total) by mouth 2 (two) times daily. 90 tablet 3  . nitroGLYCERIN (NITROSTAT) 0.4 MG SL tablet Place 1 tablet (0.4 mg total) under the tongue every 5 (five) minutes x 3 doses as needed for chest pain. 25 tablet 12  . potassium chloride (K-DUR,KLOR-CON) 10 MEQ tablet Take 1 tablet (10 mEq total) by mouth 2 (two) times daily. 180 tablet 2  . warfarin (COUMADIN) 2.5 MG tablet Hold Coumadin today. Take 1 tablet (2.5mg ) tomorrow (Sunday 06/27/14). Call the office on Monday 06/28/14 to request further instructions on dosing.     No current facility-administered medications on file prior to visit.    BP 128/78 mmHg  Pulse 74  Temp(Src) 98.1 F (36.7 C) (Oral)  Ht 5\' 5"  (1.651 m)  Wt 286 lb 6.4 oz (129.91 kg)  BMI 47.66 kg/m2  SpO2 97%    Objective:   Physical Exam  Constitutional: She is oriented to person, place, and time.  Cardiovascular: Normal rate and regular rhythm.   Pulmonary/Chest: Effort normal and breath sounds normal.  Abdominal: Soft. Bowel sounds are normal. She exhibits no distension. There is no tenderness. There is no CVA tenderness.  Neurological: She is alert and oriented to person, place, and time.  Skin: Skin is warm and dry.  Psychiatric: She has a normal mood and affect.          Assessment & Plan:  Pelvic pressure:  UA: Negative for leuks, nitrites, glucose. Positive for blood and protein. Denies visible blood in urine at home. No CVA tenderness. Abdominal exam unremarkable. Will repeat urine in 1 week for further evaluation of blood.

## 2014-08-02 NOTE — Progress Notes (Signed)
Date:  08/02/2014   Patient ID:  [name] Jul 07, 1945, MRN [id number]   PCP:  Ruthe Mannan, MD  Cardiologist:  Dr. Antoine Poche  History of Present Illness:  [name] is a 69 y.o. female with history of CAD s/p PCI 2010, PAF, chronic diastolic CHF, HTN, HL, and apical hypertrophy. She is here today for EP follow-up of afib.  She has been placed on amiodarone for rate control.  She has done "much better" and is not aware of episodes of afib.  She is in afib today but thinks that she is in sinus. She has chronic dyspnea with exertion, probably mutifactorial.  This was no better with sinus rhythm.  She has sleep apnea, wears cpap regularly and is exercising on a regular basis in cardiac rehab. She is limited in her exercise ability due to bilateral knee replacement.  Recent Labs: 06/25/2014: B Natriuretic Peptide 631.3*; Hemoglobin 14.6 06/26/2014: ALT 34; Creatinine, Ser 1.17*; HDL 33*; LDL Cholesterol 135*; Potassium 3.8; TSH 3.968  Wt Readings from Last 3 Encounters:  08/02/14 129.366 kg (285 lb 3.2 oz)  07/02/14 130.727 kg (288 lb 3.2 oz)  06/26/14 128.141 kg (282 lb 8 oz)     Past Medical History  Diagnosis Date  . Hyperlipidemia   . Hypertension   . Osteoporosis   . PAF (paroxysmal atrial fibrillation)     a. s/p multiple cardioversions;  b. 2010: on Tikosyn - dose decreased from to in setting of NSTEMI/QT prolongation. Did not hold NSR. c. admitted 08/2011 with loading at BID with DCCV at that time;  d. Chronic coumadin (CHA2DS2VASc = 5). e. 06/2014: Joice Lofts stopped due to ineffective; started on amiodarone.  [name] CAD (coronary artery disease)     a. 07/2008 NSTEMI ->Cath: LM nl, LAD 70p/90p, 90m, LCX nl, RCA nl, EF 60%.  The LAD was stented with a 2.25x35mm Veri-Flex BMS.  b. 8/15 cath patent stent, nonobstructive disease otherwise - unchanged. Elevated troponin felt due to demand ischemia in setting of AF-RVR. c. Elevated trop 06/2014 felt due to demand  ischemia.  . Duodenitis   . Headache(784.0)   . Chronic diastolic CHF (congestive heart failure)     a. 07/2008 Echo: EF 60-65%, Triv AI/MR, Mild TR;  b. 09/2013 Echo: EF 60-65%, mild conc LVH.  . Morbid obesity   . Difficult intubation   . History of positive PPD   . Arthritis   . Renal insufficiency     a. Baseline Cr appears 1.1-1.2.    Current Outpatient Prescriptions  Medication Sig Dispense Refill  . acetaminophen (TYLENOL) 500 MG tablet Take 1,000 mg by mouth every 6 (six) hours as needed for headache.    [name] amiodarone (PACERONE) 200 MG tablet Take 200 mg by mouth daily.    . furosemide (LASIX) 40 MG tablet Take 1 tablet (40 mg total) by mouth daily. 30 tablet 6  . magnesium oxide (MAG-OX) 400 MG tablet Take 1 tablet (400 mg total) by mouth daily. 90 tablet 3  . metoprolol (LOPRESSOR) 50 MG tablet Take 1.5 tablets (75 mg total) by mouth 2 (two) times daily. 90 tablet 3  . nitroGLYCERIN (NITROSTAT) 0.4 MG SL tablet Place 1 tablet (0.4 mg total) under the tongue every 5 (five) minutes x 3 doses as needed for chest pain. 25 tablet 12  . potassium chloride (K-DUR,KLOR-CON) 10 MEQ tablet Take 1 tablet (10 mEq total) by mouth 2 (two) times daily. 180 tablet 2  . warfarin (COUMADIN)  2.5 MG tablet Hold Coumadin today. Take 1 tablet (2.5mg ) tomorrow (Sunday 06/27/14). Call the office on Monday 06/28/14 to request further instructions on dosing.     No current facility-administered medications for this visit.    Allergies:   Rosuvastatin and Statins     ROS:  Please see the history of present illness.    All other systems reviewed and negative.   PHYSICAL EXAM:  VS:  BP 132/78 mmHg  Pulse 68  Ht  (1.651 m)  Wt 129.366 kg (285 lb 3.2 oz)  BMI 47.46 kg/m2 Well nourished, well developed, obese WF in no acute distress HEENT: normal Neck: no JVD Cardiac: irregular rate and rhythm,  normal S1, S2;; no murmur Lungs:  clear to auscultation bilaterally, no wheezing, rhonchi or  rales Abd: soft, nontender, no hepatomegaly Ext: no edema Skin: warm and dry Neuro:  moves all extremities spontaneously, no focal abnormalities noted  EKG:  Afib, 68 bpm, ST/T wave changes(unchanged)  ASSESSMENT AND PLAN:  1. Persistent AFib failed medical therapy with tikosyn  Doing much better with amiodarone.  Rate controlled  On anticoagulation  Would continue current regimen given her current clinical status 2. Noac vrs warfarin-  Pt tolerating warfarin at this time and is not interested in switching over to NOAC due to cost. 3. CAD-stable,  4. Diastolic HF-stable 5. Sleep apnea- continue cpap 6. Obesity- Body mass index is 47.46 kg/(m^2).  Follow-up with Rudi Coco NP in the AF clinic in 1 month and then every 3 months going forward.  I will see when needed Follow-up with Dr Antoine Poche as scheduled  Hillis Range MD, [facility] 08/02/2014 2:14 PM

## 2014-08-10 ENCOUNTER — Other Ambulatory Visit (INDEPENDENT_AMBULATORY_CARE_PROVIDER_SITE_OTHER): Payer: Medicare Other

## 2014-08-10 DIAGNOSIS — R350 Frequency of micturition: Secondary | ICD-10-CM

## 2014-08-10 DIAGNOSIS — R319 Hematuria, unspecified: Secondary | ICD-10-CM | POA: Diagnosis not present

## 2014-08-10 LAB — URINALYSIS
Bilirubin Urine: NEGATIVE
Hgb urine dipstick: NEGATIVE
Ketones, ur: NEGATIVE
Leukocytes, UA: NEGATIVE
Nitrite: NEGATIVE
PH: 7 (ref 5.0–8.0)
SPECIFIC GRAVITY, URINE: 1.015 (ref 1.000–1.030)
Total Protein, Urine: NEGATIVE
Urine Glucose: NEGATIVE
Urobilinogen, UA: 0.2 (ref 0.0–1.0)

## 2014-08-23 ENCOUNTER — Ambulatory Visit (INDEPENDENT_AMBULATORY_CARE_PROVIDER_SITE_OTHER): Payer: Medicare Other

## 2014-08-23 DIAGNOSIS — I4891 Unspecified atrial fibrillation: Secondary | ICD-10-CM

## 2014-08-23 DIAGNOSIS — I4819 Other persistent atrial fibrillation: Secondary | ICD-10-CM

## 2014-08-23 DIAGNOSIS — Z7901 Long term (current) use of anticoagulants: Secondary | ICD-10-CM

## 2014-08-23 DIAGNOSIS — Z5181 Encounter for therapeutic drug level monitoring: Secondary | ICD-10-CM | POA: Diagnosis not present

## 2014-08-23 DIAGNOSIS — I481 Persistent atrial fibrillation: Secondary | ICD-10-CM | POA: Diagnosis not present

## 2014-08-23 LAB — POCT INR: INR: 2.6

## 2014-08-31 ENCOUNTER — Other Ambulatory Visit: Payer: Self-pay

## 2014-09-01 ENCOUNTER — Ambulatory Visit (HOSPITAL_COMMUNITY)
Admission: RE | Admit: 2014-09-01 | Discharge: 2014-09-01 | Disposition: A | Discharge status: Home / Self Care | Payer: Medicare Other | Source: Ambulatory Visit | Attending: Nurse Practitioner | Admitting: Nurse Practitioner

## 2014-09-01 ENCOUNTER — Encounter (HOSPITAL_COMMUNITY): Payer: Self-pay | Admitting: Nurse Practitioner

## 2014-09-01 VITALS — BP 118/80 | HR 72 | Ht 65.0 in | Wt 288.6 lb

## 2014-09-01 DIAGNOSIS — I252 Old myocardial infarction: Secondary | ICD-10-CM | POA: Insufficient documentation

## 2014-09-01 DIAGNOSIS — I5032 Chronic diastolic (congestive) heart failure: Secondary | ICD-10-CM | POA: Insufficient documentation

## 2014-09-01 DIAGNOSIS — E785 Hyperlipidemia, unspecified: Secondary | ICD-10-CM | POA: Diagnosis not present

## 2014-09-01 DIAGNOSIS — I482 Chronic atrial fibrillation, unspecified: Secondary | ICD-10-CM

## 2014-09-01 DIAGNOSIS — Z955 Presence of coronary angioplasty implant and graft: Secondary | ICD-10-CM | POA: Diagnosis not present

## 2014-09-01 DIAGNOSIS — Z6841 Body Mass Index (BMI) 40.0 and over, adult: Secondary | ICD-10-CM | POA: Insufficient documentation

## 2014-09-01 DIAGNOSIS — I2510 Atherosclerotic heart disease of native coronary artery without angina pectoris: Secondary | ICD-10-CM | POA: Insufficient documentation

## 2014-09-01 DIAGNOSIS — I10 Essential (primary) hypertension: Secondary | ICD-10-CM | POA: Insufficient documentation

## 2014-09-01 DIAGNOSIS — Z79899 Other long term (current) drug therapy: Secondary | ICD-10-CM | POA: Insufficient documentation

## 2014-09-01 DIAGNOSIS — E669 Obesity, unspecified: Secondary | ICD-10-CM | POA: Insufficient documentation

## 2014-09-01 DIAGNOSIS — M810 Age-related osteoporosis without current pathological fracture: Secondary | ICD-10-CM | POA: Diagnosis not present

## 2014-09-01 DIAGNOSIS — Z7901 Long term (current) use of anticoagulants: Secondary | ICD-10-CM | POA: Diagnosis not present

## 2014-09-01 DIAGNOSIS — G4730 Sleep apnea, unspecified: Secondary | ICD-10-CM | POA: Diagnosis not present

## 2014-09-01 DIAGNOSIS — Z8249 Family history of ischemic heart disease and other diseases of the circulatory system: Secondary | ICD-10-CM | POA: Diagnosis not present

## 2014-09-01 DIAGNOSIS — Z96653 Presence of artificial knee joint, bilateral: Secondary | ICD-10-CM | POA: Diagnosis not present

## 2014-09-01 LAB — COMPREHENSIVE METABOLIC PANEL
ALBUMIN: 3.4 g/dL — AB (ref 3.5–5.0)
ALK PHOS: 72 U/L (ref 38–126)
ALT: 27 U/L (ref 14–54)
AST: 35 U/L (ref 15–41)
Anion gap: 7 (ref 5–15)
BUN: 14 mg/dL (ref 6–20)
CHLORIDE: 104 mmol/L (ref 101–111)
CO2: 27 mmol/L (ref 22–32)
CREATININE: 1.28 mg/dL — AB (ref 0.44–1.00)
Calcium: 9.3 mg/dL (ref 8.9–10.3)
GFR calc Af Amer: 48 mL/min — ABNORMAL LOW (ref >60)
GFR calc non Af Amer: 41 mL/min — ABNORMAL LOW (ref >60)
GLUCOSE: 119 mg/dL — AB (ref 65–99)
Potassium: 3.7 mmol/L (ref 3.5–5.1)
Sodium: 138 mmol/L (ref 135–145)
Total Bilirubin: 0.4 mg/dL (ref 0.3–1.2)
Total Protein: 6.6 g/dL (ref 6.5–8.1)

## 2014-09-01 LAB — TSH: TSH: 3.049 u[IU]/mL (ref 0.350–4.500)

## 2014-09-01 LAB — TROPONIN I: Troponin I: <0.03 ng/mL (ref <0.031)

## 2014-09-01 NOTE — Patient Instructions (Signed)
Parking code for October 0900 

## 2014-09-01 NOTE — Progress Notes (Signed)
Patient ID: [name], female   DOB: 1945-07-17, 69 y.o.   MRN: [id number]     Primary Care Physician: Ruthe Mannan, MD Referring Physician: Dr. Hetty Ely is a 69 y.o. female with a h/o of CAD s/p PCI 2010, PAF, chronic diastolic CHF, HTN, HL, and apical hypertrophy. She is here today for  follow-up of in the afib clinic. She was last seen 08/02/14 by Dr. Johney Frame and thought to be stable.  She has been placed on amiodarone for rate control. She has failed amiodarone in the past. She has done "much better" and is not aware of episodes of afib. She is in afib, but rate controlled.  She has chronic dyspnea with exertion, probably mutifactorial. This was no better with sinus rhythm. She has sleep apnea, wears cpap regularly and is exercising on a regular basis in cardiac rehab. She is limited in her exercise ability due to bilateral knee replacement. She c/o chronic fatigue.   Today, she denies symptoms of palpitations, chest pain, shortness of breath, orthopnea, PND, lower extremity edema, dizziness, presyncope, syncope, or neurologic sequela. The patient is tolerating medications without difficulties and is otherwise without complaint today.   Past Medical History  Diagnosis Date  . Hyperlipidemia   . Hypertension   . Osteoporosis   . PAF (paroxysmal atrial fibrillation)     a. s/p multiple cardioversions;  b. 2010: on Tikosyn - dose decreased from to in setting of NSTEMI/QT prolongation. Did not hold NSR. c. admitted 08/2011 with loading at BID with DCCV at that time;  d. Chronic coumadin (CHA2DS2VASc = 5). e. 06/2014: Joice Lofts stopped due to ineffective; started on amiodarone.  [name] CAD (coronary artery disease)     a. 07/2008 NSTEMI ->Cath: LM nl, LAD 70p/90p, 74m, LCX nl, RCA nl, EF 60%.  The LAD was stented with a 2.25x37mm Veri-Flex BMS.  b. 8/15 cath patent stent, nonobstructive disease otherwise - unchanged. Elevated troponin felt due to demand ischemia  in setting of AF-RVR. c. Elevated trop 06/2014 felt due to demand ischemia.  . Duodenitis   . Headache(784.0)   . Chronic diastolic CHF (congestive heart failure)     a. 07/2008 Echo: EF 60-65%, Triv AI/MR, Mild TR;  b. 09/2013 Echo: EF 60-65%, mild conc LVH.  . Morbid obesity   . Difficult intubation   . History of positive PPD   . Arthritis   . Renal insufficiency     a. Baseline Cr appears 1.1-1.2.   Past Surgical History  Procedure Laterality Date  . Cholecystectomy    . Abdominal hysterectomy      partial, bleeding  . Knee arthroscopy  2000 & 2001    left and right  . Esophagogastroduodenoscopy  2003  . Coronary stent placement    . Tubal ligation    . Total knee arthroplasty      bilateral  . Left heart catheterization with coronary angiogram N/A 09/24/2013    Procedure: LEFT HEART CATHETERIZATION WITH CORONARY ANGIOGRAM;  Surgeon: Micheline Chapman, MD;  [facility] [place], LAD stent patent, mLAD 50-60%, CFX OK, RCA 30-40%, EF 70%  . Coronary angioplasty with stent placement  2010    2.75- x 20-mm VeriFlex DES to the pLAD    Current Outpatient Prescriptions  Medication Sig Dispense Refill  . acetaminophen (TYLENOL) 500 MG tablet Take 1,000 mg by mouth every 6 (six) hours as needed for headache.    [name] amiodarone (PACERONE) 200 MG tablet Take 200 mg  by mouth daily.    . furosemide (LASIX) 40 MG tablet Take 1 tablet (40 mg total) by mouth daily. 30 tablet 6  . magnesium oxide (MAG-OX) 400 MG tablet Take 1 tablet (400 mg total) by mouth daily. 90 tablet 3  . metoprolol (LOPRESSOR) 50 MG tablet Take 1.5 tablets (75 mg total) by mouth 2 (two) times daily. 90 tablet 3  . potassium chloride (K-DUR,KLOR-CON) 10 MEQ tablet Take 1 tablet (10 mEq total) by mouth 2 (two) times daily. 180 tablet 2  . warfarin (COUMADIN) 2.5 MG tablet Hold Coumadin today. Take 1 tablet (2.5mg ) tomorrow (Sunday 06/27/14). Call the office on Monday 06/28/14 to request further instructions on dosing.    . nitroGLYCERIN  (NITROSTAT) 0.4 MG SL tablet Place 1 tablet (0.4 mg total) under the tongue every 5 (five) minutes x 3 doses as needed for chest pain. (Patient not taking: Reported on 09/01/2014) 25 tablet 12   No current facility-administered medications for this encounter.    Allergies  Allergen Reactions  . Rosuvastatin Other (See Comments)    REACTION: muscle and joint pain  . Statins Other (See Comments)    Muscle and joint pain    History   Social History  . Marital Status: Widowed    Spouse Name: N/A  . Number of Children: 2  . Years of Education: N/A   Occupational History  . retired    Social History Main Topics  . Smoking status: Never Smoker   . Smokeless tobacco: Never Used  . Alcohol Use: No  . Drug Use: No  . Sexual Activity: Not Currently    Birth Control/ Protection: Post-menopausal   Other Topics Concern  . Not on file   Social History Narrative   Lives in [place], [place] w/ husband.    Family History  Problem Relation Age of Onset  . Other Mother     GI Bleed  . Pneumonia Father   . Coronary artery disease Brother   . Breast cancer Paternal Grandmother   . Rectal cancer Paternal Grandfather     ROS- All systems are reviewed and negative except as per the HPI above  Physical Exam: Filed Vitals:   09/01/14 0955  BP: 118/80  Pulse: 72  Height:  (1.651 m)  Weight: 288 lb 9.6 oz (130.908 kg)    GEN- The patient is well appearing, alert and oriented x 3 today.   Head- normocephalic, atraumatic Eyes-  Sclera clear, conjunctiva pink Ears- hearing intact Oropharynx- clear Neck- supple, no JVP Lymph- no cervical lymphadenopathy Lungs- Clear to ausculation bilaterally, normal work of breathing Heart- Iregular rate and rhythm, no murmurs, rubs or gallops, PMI not laterally displaced GI- soft, NT, ND, + BS Extremities- no clubbing, cyanosis, or edema MS- no significant deformity or atrophy Skin- no rash or lesion Psych- euthymic mood, full affect Neuro-  strength and sensation are intact  EKG-Afib at 72 bpm, ST/T wave abnormalities, unchanged from previous.   Assessment and Plan: 1. Chronic afib Rate controlled with amiodarone Labs today, tsh/liver Pt asked if troponin can be drawn due to a doctor telling at last hospitalization her she needed one at baseline due to always having an elevated troponin in the hospital.   2. Sleep apnea Wearing cpap  3. Obesity Limited in exercise ability due to knee issues  F/u in 3 months afib clinic Dr. Antoine Poche as scheduled early 2017

## 2014-09-02 ENCOUNTER — Inpatient Hospital Stay (HOSPITAL_COMMUNITY): Admission: RE | Admit: 2014-09-02 | Payer: Medicare Other | Source: Ambulatory Visit | Admitting: Nurse Practitioner

## 2014-09-20 ENCOUNTER — Ambulatory Visit (INDEPENDENT_AMBULATORY_CARE_PROVIDER_SITE_OTHER): Payer: Medicare Other | Admitting: *Deleted

## 2014-09-20 DIAGNOSIS — I4891 Unspecified atrial fibrillation: Secondary | ICD-10-CM | POA: Diagnosis not present

## 2014-09-20 DIAGNOSIS — Z5181 Encounter for therapeutic drug level monitoring: Secondary | ICD-10-CM | POA: Diagnosis not present

## 2014-09-20 DIAGNOSIS — I481 Persistent atrial fibrillation: Secondary | ICD-10-CM | POA: Diagnosis not present

## 2014-09-20 DIAGNOSIS — Z7901 Long term (current) use of anticoagulants: Secondary | ICD-10-CM | POA: Diagnosis not present

## 2014-09-20 DIAGNOSIS — I4819 Other persistent atrial fibrillation: Secondary | ICD-10-CM

## 2014-09-20 LAB — POCT INR: INR: 3.3

## 2014-10-08 ENCOUNTER — Ambulatory Visit (INDEPENDENT_AMBULATORY_CARE_PROVIDER_SITE_OTHER): Payer: Medicare Other | Admitting: *Deleted

## 2014-10-08 DIAGNOSIS — I481 Persistent atrial fibrillation: Secondary | ICD-10-CM | POA: Diagnosis not present

## 2014-10-08 DIAGNOSIS — I4891 Unspecified atrial fibrillation: Secondary | ICD-10-CM | POA: Diagnosis not present

## 2014-10-08 DIAGNOSIS — I4819 Other persistent atrial fibrillation: Secondary | ICD-10-CM

## 2014-10-08 DIAGNOSIS — Z7901 Long term (current) use of anticoagulants: Secondary | ICD-10-CM | POA: Diagnosis not present

## 2014-10-08 DIAGNOSIS — Z5181 Encounter for therapeutic drug level monitoring: Secondary | ICD-10-CM | POA: Diagnosis not present

## 2014-10-08 LAB — POCT INR: INR: 3.8

## 2014-10-18 ENCOUNTER — Ambulatory Visit (INDEPENDENT_AMBULATORY_CARE_PROVIDER_SITE_OTHER): Payer: Medicare Other | Admitting: *Deleted

## 2014-10-18 DIAGNOSIS — I4891 Unspecified atrial fibrillation: Secondary | ICD-10-CM

## 2014-10-18 DIAGNOSIS — I481 Persistent atrial fibrillation: Secondary | ICD-10-CM

## 2014-10-18 DIAGNOSIS — Z7901 Long term (current) use of anticoagulants: Secondary | ICD-10-CM

## 2014-10-18 DIAGNOSIS — I4819 Other persistent atrial fibrillation: Secondary | ICD-10-CM

## 2014-10-18 DIAGNOSIS — Z5181 Encounter for therapeutic drug level monitoring: Secondary | ICD-10-CM

## 2014-10-18 LAB — POCT INR: INR: 3.6

## 2014-10-18 MED ORDER — WARFARIN SODIUM 2.5 MG PO TABS: 2.5000 mg | ORAL_TABLET | Freq: Every day | ORAL | Status: DC

## 2014-10-26 ENCOUNTER — Other Ambulatory Visit (HOSPITAL_COMMUNITY): Payer: Self-pay | Admitting: Nurse Practitioner

## 2014-11-01 ENCOUNTER — Ambulatory Visit (INDEPENDENT_AMBULATORY_CARE_PROVIDER_SITE_OTHER): Payer: Medicare Other | Admitting: *Deleted

## 2014-11-01 DIAGNOSIS — I481 Persistent atrial fibrillation: Secondary | ICD-10-CM | POA: Diagnosis not present

## 2014-11-01 DIAGNOSIS — Z7901 Long term (current) use of anticoagulants: Secondary | ICD-10-CM | POA: Diagnosis not present

## 2014-11-01 DIAGNOSIS — Z23 Encounter for immunization: Secondary | ICD-10-CM | POA: Diagnosis not present

## 2014-11-01 DIAGNOSIS — I4891 Unspecified atrial fibrillation: Secondary | ICD-10-CM | POA: Diagnosis not present

## 2014-11-01 DIAGNOSIS — Z5181 Encounter for therapeutic drug level monitoring: Secondary | ICD-10-CM | POA: Diagnosis not present

## 2014-11-01 DIAGNOSIS — I4819 Other persistent atrial fibrillation: Secondary | ICD-10-CM

## 2014-11-01 LAB — POCT INR: INR: 2.5

## 2014-11-19 ENCOUNTER — Encounter: Payer: Self-pay | Admitting: Adult Health

## 2014-11-19 ENCOUNTER — Ambulatory Visit (INDEPENDENT_AMBULATORY_CARE_PROVIDER_SITE_OTHER): Payer: Medicare Other | Admitting: Adult Health

## 2014-11-19 VITALS — BP 126/80 | HR 60 | Temp 97.9°F | Ht 65.0 in | Wt 287.8 lb

## 2014-11-19 DIAGNOSIS — G4730 Sleep apnea, unspecified: Secondary | ICD-10-CM

## 2014-11-19 DIAGNOSIS — I2510 Atherosclerotic heart disease of native coronary artery without angina pectoris: Secondary | ICD-10-CM | POA: Diagnosis not present

## 2014-11-19 NOTE — Assessment & Plan Note (Signed)
Work on weight loss.

## 2014-11-19 NOTE — Addendum Note (Signed)
Addended by: Karalee Height on: 11/19/2014 10:13 AM   Modules accepted: Orders

## 2014-11-19 NOTE — Assessment & Plan Note (Signed)
Controlled on CPAP  Download requested   Plan  Wear CPAP At bedtime  Keep up good work.  Goal is to wear 6 hrs each night  Work on weight loss Do not drive if sleepy.  Download requested.  follow up Dr. Vassie Loll  In 1 year and As needed  .

## 2014-11-19 NOTE — Progress Notes (Signed)
Subjective:    Patient ID: [name], female    DOB: [birth year], 69 y.o.   MRN: [id number]  HPI 69 yo morbidly obese female with OSA on CPAP .   TEST  PSG 10/07 >> AHI 49/h corrected by CPAP 15 cm (wt 200 lbs) She has gained 75 lbs since  10/13 Events were corrected by 15 cm to absence of events and 17 cm to absence of snoring. CPAP titration was optimal with a medium full- face mask.  Home study showed moderate OSA  Download on 15 cm 01/2012 >> good control of events - cpap working well, leak ++, needs better seal on mask  09/15/2012 Download 2/14 - on 15 cm ,No residuals, good compliance   11/19/2014 Follow up : Mod to Severe OSA on CPAP  Pt returns for 1 year follow up for sleep apnea.  She says she is doing well on CPAP .  Feels rest during daytime Wears CPAP  for around 6 hrs each night.  No mask or supply issues.  No recent download, requested from DME .  Denies chest pain, orthopnea , fever or increased edema.    Past Medical History  Diagnosis Date  . Hyperlipidemia   . Hypertension   . Osteoporosis   . PAF (paroxysmal atrial fibrillation) (HCC)     a. s/p multiple cardioversions;  b. 2010: on Tikosyn - dose decreased from to in setting of NSTEMI/QT prolongation. Did not hold NSR. c. admitted 08/2011 with loading at BID with DCCV at that time;  d. Chronic coumadin (CHA2DS2VASc = 5). e. 06/2014: Joice Lofts stopped due to ineffective; started on amiodarone.  [name] CAD (coronary artery disease)     a. 07/2008 NSTEMI ->Cath: LM nl, LAD 70p/90p, 7m, LCX nl, RCA nl, EF 60%.  The LAD was stented with a 2.25x55mm Veri-Flex BMS.  b. 8/15 cath patent stent, nonobstructive disease otherwise - unchanged. Elevated troponin felt due to demand ischemia in setting of AF-RVR. c. Elevated trop 06/2014 felt due to demand ischemia.  . Duodenitis   . Headache(784.0)   . Chronic diastolic CHF (congestive heart failure) (HCC)     a. 07/2008 Echo: EF 60-65%, Triv AI/MR, Mild TR;   b. 09/2013 Echo: EF 60-65%, mild conc LVH.  . Morbid obesity (HCC)   . Difficult intubation   . History of positive PPD   . Arthritis   . Renal insufficiency     a. Baseline Cr appears 1.1-1.2.     Current Outpatient Prescriptions on File Prior to Visit  Medication Sig Dispense Refill  . acetaminophen (TYLENOL) 500 MG tablet Take 1,000 mg by mouth every 6 (six) hours as needed for headache.    [name] amiodarone (PACERONE) 200 MG tablet Take 200 mg by mouth daily.    . furosemide (LASIX) 40 MG tablet Take 1 tablet (40 mg total) by mouth daily. 30 tablet 6  . magnesium oxide (MAG-OX) 400 MG tablet Take 1 tablet (400 mg total) by mouth daily. 90 tablet 3  . metoprolol (LOPRESSOR) 50 MG tablet TAKE 1 AND 1/2 TABLETS BY MOUTH 2 TIMES DAILY 90 tablet 11  . nitroGLYCERIN (NITROSTAT) 0.4 MG SL tablet Place 1 tablet (0.4 mg total) under the tongue every 5 (five) minutes x 3 doses as needed for chest pain. 25 tablet 12  . potassium chloride (K-DUR,KLOR-CON) 10 MEQ tablet Take 1 tablet (10 mEq total) by mouth 2 (two) times daily. 180 tablet 2  . warfarin (COUMADIN) 2.5 MG tablet  Take 1 tablet (2.5 mg total) by mouth daily at 6 PM. Or as directed by Coumadin Clinic 30 tablet 3   No current facility-administered medications on file prior to visit.    Review of Systems Constitutional:   No  weight loss, night sweats,  Fevers, chills,  +fatigue, or  lassitude.  HEENT:   No headaches,  Difficulty swallowing,  Tooth/dental problems, or  Sore throat,                No sneezing, itching, ear ache, nasal congestion, post nasal drip,   CV:  No chest pain,  Orthopnea, PND, swelling in lower extremities, anasarca, dizziness, palpitations, syncope.   GI  No heartburn, indigestion, abdominal pain, nausea, vomiting, diarrhea, change in bowel habits, loss of appetite, bloody stools.   Resp:    No chest wall deformity  Skin: no rash or lesions.  GU: no dysuria, change in color of urine, no urgency or frequency.   No flank pain, no hematuria   MS:  No joint pain or swelling.  No decreased range of motion.  No back pain.  Psych:  No change in mood or affect. No depression or anxiety.  No memory loss.         Objective:   Physical Exam GEN: A/Ox3; pleasant , NAD, obese   HEENT:  [place]/AT,  EACs-clear, TMs-wnl, NOSE-clear, THROAT-clear, no lesions, no postnasal drip or exudate noted. Class 2-3 MP airway   NECK:  Supple w/ fair ROM; no JVD; normal carotid impulses w/o bruits; no thyromegaly or nodules palpated; no lymphadenopathy.  RESP  Clear  P & A; w/o, wheezes/ rales/ or rhonchi.no accessory muscle use, no dullness to percussion  CARD:  RRR, no m/r/g  , tr peripheral edema, pulses intact, no cyanosis or clubbing.  GI:   Soft & nt; nml bowel sounds; no organomegaly or masses detected.  Musco: Warm bil, no deformities or joint swelling noted.   Neuro: alert, no focal deficits noted.    Skin: Warm, no lesions or rashes         Assessment & Plan:

## 2014-11-19 NOTE — Patient Instructions (Signed)
Wear CPAP At bedtime  Keep up good work.  Goal is to wear 6 hrs each night  Work on weight loss Do not drive if sleepy.  Download requested.  follow up Dr. Vassie Loll  In 1 year and As needed  .

## 2014-11-21 ENCOUNTER — Other Ambulatory Visit: Payer: Self-pay | Admitting: Cardiology

## 2014-11-21 NOTE — Progress Notes (Signed)
Reviewed & agree with plan  

## 2014-11-22 ENCOUNTER — Ambulatory Visit (INDEPENDENT_AMBULATORY_CARE_PROVIDER_SITE_OTHER): Payer: Medicare Other | Admitting: *Deleted

## 2014-11-22 DIAGNOSIS — Z5181 Encounter for therapeutic drug level monitoring: Secondary | ICD-10-CM

## 2014-11-22 DIAGNOSIS — I4819 Other persistent atrial fibrillation: Secondary | ICD-10-CM

## 2014-11-22 DIAGNOSIS — I481 Persistent atrial fibrillation: Secondary | ICD-10-CM | POA: Diagnosis not present

## 2014-11-22 DIAGNOSIS — I4891 Unspecified atrial fibrillation: Secondary | ICD-10-CM

## 2014-11-22 DIAGNOSIS — Z7901 Long term (current) use of anticoagulants: Secondary | ICD-10-CM

## 2014-11-22 LAB — POCT INR: INR: 2

## 2014-11-22 NOTE — Telephone Encounter (Signed)
Rx request sent to pharmacy.  

## 2014-12-03 ENCOUNTER — Ambulatory Visit (HOSPITAL_COMMUNITY)
Admission: RE | Admit: 2014-12-03 | Discharge: 2014-12-03 | Disposition: A | Discharge status: Home / Self Care | Payer: Medicare Other | Source: Ambulatory Visit | Attending: Nurse Practitioner | Admitting: Nurse Practitioner

## 2014-12-03 ENCOUNTER — Encounter (HOSPITAL_COMMUNITY): Payer: Self-pay | Admitting: Nurse Practitioner

## 2014-12-03 VITALS — BP 156/86 | HR 79 | Ht 65.0 in | Wt 284.4 lb

## 2014-12-03 DIAGNOSIS — I482 Chronic atrial fibrillation, unspecified: Secondary | ICD-10-CM

## 2014-12-03 DIAGNOSIS — E669 Obesity, unspecified: Secondary | ICD-10-CM | POA: Diagnosis not present

## 2014-12-03 DIAGNOSIS — I481 Persistent atrial fibrillation: Secondary | ICD-10-CM

## 2014-12-03 DIAGNOSIS — I4819 Other persistent atrial fibrillation: Secondary | ICD-10-CM

## 2014-12-03 DIAGNOSIS — G4730 Sleep apnea, unspecified: Secondary | ICD-10-CM | POA: Insufficient documentation

## 2014-12-03 LAB — COMPREHENSIVE METABOLIC PANEL
ALT: 26 U/L (ref 14–54)
ANION GAP: 6 (ref 5–15)
AST: 38 U/L (ref 15–41)
Albumin: 3.6 g/dL (ref 3.5–5.0)
Alkaline Phosphatase: 69 U/L (ref 38–126)
BILIRUBIN TOTAL: 0.8 mg/dL (ref 0.3–1.2)
BUN: 16 mg/dL (ref 6–20)
CALCIUM: 9.6 mg/dL (ref 8.9–10.3)
CO2: 31 mmol/L (ref 22–32)
Chloride: 100 mmol/L — ABNORMAL LOW (ref 101–111)
Creatinine, Ser: 1.43 mg/dL — ABNORMAL HIGH (ref 0.44–1.00)
GFR calc non Af Amer: 36 mL/min — ABNORMAL LOW (ref >60)
GFR, EST AFRICAN AMERICAN: 42 mL/min — AB (ref >60)
Glucose, Bld: 124 mg/dL — ABNORMAL HIGH (ref 65–99)
Potassium: 4.1 mmol/L (ref 3.5–5.1)
SODIUM: 137 mmol/L (ref 135–145)
TOTAL PROTEIN: 6.6 g/dL (ref 6.5–8.1)

## 2014-12-03 LAB — TSH: TSH: 3.637 u[IU]/mL (ref 0.350–4.500)

## 2014-12-03 MED ORDER — FUROSEMIDE 40 MG PO TABS: 40.0000 mg | ORAL_TABLET | Freq: Every day | ORAL | Status: DC

## 2014-12-03 NOTE — Patient Instructions (Signed)
Your physician has recommended that you have a pulmonary function test. Pulmonary Function Tests are a group of tests that measure how well air moves in and out of your lungs.  Mon Nov 7th at 845am -- no caffeine, smoking or inhalation 4 hours prior to test. May eat and drink normally. Check in with admissions at [facility] of hospital.  Test takes about 45-1 hour.  Parking code for January - 1000

## 2014-12-03 NOTE — Progress Notes (Signed)
Patient ID: [name], female   DOB: 08-Mar-1945, 69 y.o.   MRN: [id number]         Primary Care Physician: Arnette Norris, MD Referring Physician: Dr. Deborah Chalk is a 69 y.o. female with a h/o of CAD s/p PCI 2010, PAF, chronic diastolic CHF, HTN, HL, and apical hypertrophy. She is here today for  follow-up of in the afib clinic. She was last seen 08/02/14 by Dr. Rayann Heman and thought to be stable, not to be a candidate for returning to [place].  She has been placed on amiodarone for rate control. She has failed tikosyn  in the past. She has done "much better" on amiodarone  and is not aware of episodes of afib. She is in afib, but rate controlled. She does not feel the afib.  She has chronic dyspnea with exertion, probably mutifactorial. This was no better with sinus rhythm. She has sleep apnea, wears cpap regularly.  She is limited in her exercise ability due to bilateral knee replacement. She c/o chronic fatigue and lower extremity edema. Has OSA, wears cpap.  Today, she denies symptoms of palpitations, chest pain, shortness of breath, orthopnea, PND, lower extremity edema, dizziness, presyncope, syncope, or neurologic sequela. The patient is tolerating medications without difficulties and is otherwise without complaint today.   Past Medical History  Diagnosis Date  . Hyperlipidemia   . Hypertension   . Osteoporosis   . PAF (paroxysmal atrial fibrillation) ([place])     a. s/p multiple cardioversions;  b. 2010: on Tikosyn - dose decreased from 516mg to 2545m in setting of NSTEMI/QT prolongation. Did not hold NSR. c. admitted 08/2011 with loading at 50059mBID with DCCV at that time;  d. Chronic coumadin (CHA2DS2VASc = 5). e. 06/2014: tikPhyllis Gingeropped due to ineffective; started on amiodarone.  . CMarland KitchenD (coronary artery disease)     a. 07/2008 NSTEMI ->Cath: LM nl, LAD 70p/90p, 67m68mX nl, RCA nl, EF 60%.  The LAD was stented with a 2.25x20mm54mi-Flex BMS.  b. 8/15 cath patent stent,  nonobstructive disease otherwise - unchanged. Elevated troponin felt due to demand ischemia in setting of AF-RVR. c. Elevated trop 06/2014 felt due to demand ischemia.  . Duodenitis   . Headache(784.0)   . Chronic diastolic CHF (congestive heart failure) (HCC) [place] a. 07/2008 Echo: EF 60-65%, Triv AI/MR, Mild TR;  b. 09/2013 Echo: EF 60-65%, mild conc LVH.  . Morbid obesity (HCC) [place] Difficult intubation   . History of positive PPD   . Arthritis   . Renal insufficiency     a. Baseline Cr appears 1.1-1.2.   Past Surgical History  Procedure Laterality Date  . Cholecystectomy    . Abdominal hysterectomy      partial, bleeding  . Knee arthroscopy  2000 & 2001    left and right  . Esophagogastroduodenoscopy  2003  . Coronary stent placement    . Tubal ligation    . Total knee arthroplasty      bilateral  . Left heart catheterization with coronary angiogram N/A 09/24/2013    Procedure: LEFT HEART CATHETERIZATION WITH CORONARY ANGIOGRAM;  Surgeon: MichaBlane Ohara  [facility], mLAD 50-60%, CFX OK, RCA 30-40%, EF 70%  . Coronary angioplasty with stent placement  2010    2.75- x 20-mm VeriFlex DES to the pLAD    Current Outpatient Prescriptions  Medication Sig Dispense Refill  . acetaminophen (TYLENOL) 500 MG tablet Take  1,000 mg by mouth every 6 (six) hours as needed for headache.    [name] amiodarone (PACERONE) 200 MG tablet TAKE 2 TABLETS BY MOUTH TWICE A DAY FOR 1 WEEK, THEN 1 TAB TWICE A DAY FOR 2 WEEKS THEN 1 TAB DAILY 70 tablet 2  . furosemide (LASIX) 40 MG tablet Take 1 tablet (40 mg total) by mouth daily. 30 tablet 3  . magnesium oxide (MAG-OX) 400 MG tablet Take 1 tablet (400 mg total) by mouth daily. (Patient taking differently: Take 400 mg by mouth as needed. Pt takes as needed if there is diarrhea.) 90 tablet 3  . metoprolol (LOPRESSOR) 50 MG tablet TAKE 1 AND 1/2 TABLETS BY MOUTH 2 TIMES DAILY 90 tablet 11  . potassium chloride (K-DUR,KLOR-CON) 10 MEQ tablet Take  1 tablet (10 mEq total) by mouth 2 (two) times daily. 180 tablet 2  . warfarin (COUMADIN) 2.5 MG tablet Take 1 tablet (2.5 mg total) by mouth daily at 6 PM. Or as directed by Coumadin Clinic 30 tablet 3  . nitroGLYCERIN (NITROSTAT) 0.4 MG SL tablet Place 1 tablet (0.4 mg total) under the tongue every 5 (five) minutes x 3 doses as needed for chest pain. (Patient not taking: Reported on 12/03/2014) 25 tablet 12   No current facility-administered medications for this encounter.    Allergies  Allergen Reactions  . Rosuvastatin Other (See Comments)    REACTION: muscle and joint pain  . Statins Other (See Comments)    Muscle and joint pain    Social History   Social History  . Marital Status: Widowed    Spouse Name: N/A  . Number of Children: 2  . Years of Education: N/A   Occupational History  . retired    Social History Main Topics  . Smoking status: Never Smoker   . Smokeless tobacco: Never Used  . Alcohol Use: No  . Drug Use: No  . Sexual Activity: Not Currently    Birth Control/ Protection: Post-menopausal   Other Topics Concern  . Not on file   Social History Narrative   Lives in [place], [place] w/ husband.    Family History  Problem Relation Age of Onset  . Other Mother     GI Bleed  . Pneumonia Father   . Coronary artery disease Brother   . Breast cancer Paternal Grandmother   . Rectal cancer Paternal Grandfather     ROS- All systems are reviewed and negative except as per the HPI above  Physical Exam: Filed Vitals:   12/03/14 0938  BP: 156/86  Pulse: 79  Height: 5' 5"  (1.651 m)  Weight: 284 lb 6.4 oz (129.003 kg)    GEN- The patient is well appearing, alert and oriented x 3 today.   Head- normocephalic, atraumatic Eyes-  Sclera clear, conjunctiva pink Ears- hearing intact Oropharynx- clear Neck- supple, no JVP Lymph- no cervical lymphadenopathy Lungs- Clear to ausculation bilaterally, normal work of breathing Heart- Iregular rate and rhythm, no  murmurs, rubs or gallops, PMI not laterally displaced GI- soft, NT, ND, + BS Extremities- no clubbing, cyanosis, or edema MS- no significant deformity or atrophy Skin- no rash or lesion Psych- euthymic mood, full affect Neuro- strength and sensation are intact  EKG-Afib at 72 bpm, ST/T wave abnormalities, unchanged from previous, qrs int 84 ms, Qtc 444 ms. Epic records reviewed   Assessment and Plan: 1. Chronic afib failed medical therapy with tikosyn Rate controlled with amiodarone Labs today, tsh/met c PFT's to be scheduled  2.  Sleep apnea Wearing cpap  3. Obesity Limited in exercise ability due to knee issues Weight loss encouraged  F/u in 3 months afib clinic  Butch Penny C. [facility], [facility] [address] [place], [place] [address] [phone]

## 2014-12-13 ENCOUNTER — Ambulatory Visit (HOSPITAL_COMMUNITY)
Admission: RE | Admit: 2014-12-13 | Discharge: 2014-12-13 | Disposition: A | Discharge status: Home / Self Care | Payer: Medicare Other | Source: Ambulatory Visit | Attending: Nurse Practitioner | Admitting: Nurse Practitioner

## 2014-12-13 DIAGNOSIS — I481 Persistent atrial fibrillation: Secondary | ICD-10-CM | POA: Diagnosis not present

## 2014-12-13 DIAGNOSIS — I4819 Other persistent atrial fibrillation: Secondary | ICD-10-CM

## 2014-12-13 DIAGNOSIS — R0609 Other forms of dyspnea: Secondary | ICD-10-CM | POA: Insufficient documentation

## 2014-12-13 LAB — PULMONARY FUNCTION TEST
DL/VA % pred: 117 %
DL/VA: 5.78 ml/min/mmHg/L
DLCO UNC: 19.95 ml/min/mmHg
DLCO unc % pred: 77 %
FEF 25-75 POST: 1.28 L/s
FEF 25-75 Pre: 1.36 L/sec
FEF2575-%Change-Post: -6 %
FEF2575-%PRED-POST: 67 %
FEF2575-%Pred-Pre: 72 %
FEV1-%CHANGE-POST: -2 %
FEV1-%PRED-POST: 65 %
FEV1-%PRED-PRE: 67 %
FEV1-Post: 1.52 L
FEV1-Pre: 1.56 L
FEV1FVC-%Change-Post: 7 %
FEV1FVC-%PRED-PRE: 103 %
FEV6-%Change-Post: -10 %
FEV6-%Pred-Post: 60 %
FEV6-%Pred-Pre: 67 %
FEV6-Post: 1.76 L
FEV6-Pre: 1.97 L
FEV6FVC-%CHANGE-POST: -1 %
FEV6FVC-%Pred-Post: 103 %
FEV6FVC-%Pred-Pre: 104 %
FVC-%Change-Post: -9 %
FVC-%PRED-POST: 58 %
FVC-%PRED-PRE: 64 %
FVC-POST: 1.79 L
FVC-PRE: 1.98 L
PRE FEV6/FVC RATIO: 100 %
Post FEV1/FVC ratio: 85 %
Post FEV6/FVC ratio: 98 %
Pre FEV1/FVC ratio: 79 %
RV % pred: 89 %
RV: 2.02 L
TLC % PRED: 78 %
TLC: 4.07 L

## 2014-12-13 MED ORDER — ALBUTEROL SULFATE (2.5 MG/3ML) 0.083% IN NEBU
2.5000 mg | INHALATION_SOLUTION | Freq: Once | RESPIRATORY_TRACT | Status: AC
  Administered 2014-12-13: 2.5 mg via RESPIRATORY_TRACT

## 2014-12-20 ENCOUNTER — Ambulatory Visit (INDEPENDENT_AMBULATORY_CARE_PROVIDER_SITE_OTHER): Payer: Medicare Other | Admitting: *Deleted

## 2014-12-20 DIAGNOSIS — Z7901 Long term (current) use of anticoagulants: Secondary | ICD-10-CM | POA: Diagnosis not present

## 2014-12-20 DIAGNOSIS — I4819 Other persistent atrial fibrillation: Secondary | ICD-10-CM

## 2014-12-20 DIAGNOSIS — Z5181 Encounter for therapeutic drug level monitoring: Secondary | ICD-10-CM

## 2014-12-20 DIAGNOSIS — I4891 Unspecified atrial fibrillation: Secondary | ICD-10-CM

## 2014-12-20 DIAGNOSIS — I481 Persistent atrial fibrillation: Secondary | ICD-10-CM

## 2014-12-20 LAB — POCT INR: INR: 1.9

## 2015-01-05 ENCOUNTER — Ambulatory Visit (INDEPENDENT_AMBULATORY_CARE_PROVIDER_SITE_OTHER): Payer: Medicare Other | Admitting: Family Medicine

## 2015-01-05 ENCOUNTER — Encounter: Payer: Self-pay | Admitting: Family Medicine

## 2015-01-05 VITALS — BP 130/80 | HR 83 | Temp 98.2°F | Wt 289.6 lb

## 2015-01-05 DIAGNOSIS — J069 Acute upper respiratory infection, unspecified: Secondary | ICD-10-CM

## 2015-01-05 DIAGNOSIS — I2510 Atherosclerotic heart disease of native coronary artery without angina pectoris: Secondary | ICD-10-CM

## 2015-01-05 DIAGNOSIS — B9789 Other viral agents as the cause of diseases classified elsewhere: Principal | ICD-10-CM

## 2015-01-05 MED ORDER — HYDROCODONE-HOMATROPINE 5-1.5 MG/5ML PO SYRP: 5.0000 mL | ORAL_SOLUTION | Freq: Three times a day (TID) | ORAL | Status: DC | PRN

## 2015-01-05 NOTE — Progress Notes (Signed)
Pre visit review using our clinic review tool, if applicable. No additional management support is needed unless otherwise documented below in the visit note. 

## 2015-01-05 NOTE — Progress Notes (Signed)
SUBJECTIVE:  [name] is a 69 y.o. female who complains of coryza, congestion, dry cough and chills for 4 days. She denies a history of anorexia, chest pain, dizziness, fatigue and fevers and denies a history of asthma although she is seeing Dr. Sherene Sires this Friday due to poor PFTs and worsening SOB (seen in heart failure clinic). Patient denies smoke cigarettes.   Current Outpatient Prescriptions on File Prior to Visit  Medication Sig Dispense Refill  . acetaminophen (TYLENOL) 500 MG tablet Take 1,000 mg by mouth every 6 (six) hours as needed for headache.    [name] amiodarone (PACERONE) 200 MG tablet TAKE 2 TABLETS BY MOUTH TWICE A DAY FOR 1 WEEK, THEN 1 TAB TWICE A DAY FOR 2 WEEKS THEN 1 TAB DAILY 70 tablet 2  . furosemide (LASIX) 40 MG tablet Take 1 tablet (40 mg total) by mouth daily. 30 tablet 3  . magnesium oxide (MAG-OX) 400 MG tablet Take 1 tablet (400 mg total) by mouth daily. (Patient taking differently: Take 400 mg by mouth as needed. Pt takes as needed if there is diarrhea.) 90 tablet 3  . metoprolol (LOPRESSOR) 50 MG tablet TAKE 1 AND 1/2 TABLETS BY MOUTH 2 TIMES DAILY 90 tablet 11  . potassium chloride (K-DUR,KLOR-CON) 10 MEQ tablet Take 1 tablet (10 mEq total) by mouth 2 (two) times daily. 180 tablet 2  . warfarin (COUMADIN) 2.5 MG tablet Take 1 tablet (2.5 mg total) by mouth daily at 6 PM. Or as directed by [facility] 30 tablet 3   No current facility-administered medications on file prior to visit.    Allergies  Allergen Reactions  . Rosuvastatin Other (See Comments)    REACTION: muscle and joint pain  . Statins Other (See Comments)    Muscle and joint pain    Past Medical History  Diagnosis Date  . Hyperlipidemia   . Hypertension   . Osteoporosis   . PAF (paroxysmal atrial fibrillation) (HCC)     a. s/p multiple cardioversions;  b. 2010: on Tikosyn - dose decreased from to in setting of NSTEMI/QT prolongation. Did not hold NSR. c. admitted 08/2011 with  loading at BID with DCCV at that time;  d. Chronic coumadin (CHA2DS2VASc = 5). e. 06/2014: Joice Lofts stopped due to ineffective; started on amiodarone.  [name] CAD (coronary artery disease)     a. 07/2008 NSTEMI ->Cath: LM nl, LAD 70p/90p, 45m, LCX nl, RCA nl, EF 60%.  The LAD was stented with a 2.25x32mm Veri-Flex BMS.  b. 8/15 cath patent stent, nonobstructive disease otherwise - unchanged. Elevated troponin felt due to demand ischemia in setting of AF-RVR. c. Elevated trop 06/2014 felt due to demand ischemia.  . Duodenitis   . Headache(784.0)   . Chronic diastolic CHF (congestive heart failure) (HCC)     a. 07/2008 Echo: EF 60-65%, Triv AI/MR, Mild TR;  b. 09/2013 Echo: EF 60-65%, mild conc LVH.  . Morbid obesity (HCC)   . Difficult intubation   . History of positive PPD   . Arthritis   . Renal insufficiency     a. Baseline Cr appears 1.1-1.2.    Past Surgical History  Procedure Laterality Date  . Cholecystectomy    . Abdominal hysterectomy      partial, bleeding  . Knee arthroscopy  2000 & 2001    left and right  . Esophagogastroduodenoscopy  2003  . Coronary stent placement    . Tubal ligation    . Total knee arthroplasty  bilateral  . Left heart catheterization with coronary angiogram N/A 09/24/2013    Procedure: LEFT HEART CATHETERIZATION WITH CORONARY ANGIOGRAM;  Surgeon: Micheline Chapman, MD;  [facility] [place], LAD stent patent, mLAD 50-60%, CFX [place], RCA 30-40%, EF 70%  . Coronary angioplasty with stent placement  2010    2.75- x 20-mm VeriFlex DES to the pLAD    Family History  Problem Relation Age of Onset  . Other Mother     GI Bleed  . Pneumonia Father   . Coronary artery disease Brother   . Breast cancer Paternal Grandmother   . Rectal cancer Paternal Grandfather     Social History   Social History  . Marital Status: Widowed    Spouse Name: N/A  . Number of Children: 2  . Years of Education: N/A   Occupational History  . retired    Social History Main Topics   . Smoking status: Never Smoker   . Smokeless tobacco: Never Used  . Alcohol Use: No  . Drug Use: No  . Sexual Activity: Not Currently    Birth Control/ Protection: Post-menopausal   Other Topics Concern  . Not on file   Social History Narrative   Lives in [place], [place] w/ husband.   The PMH, PSH, Social History, Family History, Medications, and allergies have been reviewed in [facility], and have been updated if relevant.  OBJECTIVE: BP 130/80 mmHg  Pulse 83  Temp(Src) 98.2 F (36.8 C) (Oral)  Wt 289 lb 9 oz (131.345 kg)  SpO2 96%  She appears well, vital signs are as noted. Ears normal.  Throat and pharynx normal.  Neck supple. No adenopathy in the neck. Nose is congested. Sinuses non tender. The chest is clear, without wheezes or rales.  ASSESSMENT:  viral upper respiratory illness  PLAN: Symptomatic therapy suggested: push fluids, rest and return office visit prn if symptoms persist or worsen. Lack of antibiotic effectiveness discussed with her. Call or return to clinic prn if these symptoms worsen or fail to improve as anticipated.

## 2015-01-07 ENCOUNTER — Ambulatory Visit (INDEPENDENT_AMBULATORY_CARE_PROVIDER_SITE_OTHER)
Admission: RE | Admit: 2015-01-07 | Discharge: 2015-01-07 | Disposition: A | Discharge status: Home / Self Care | Payer: Medicare Other | Source: Ambulatory Visit | Attending: Internal Medicine | Admitting: Internal Medicine

## 2015-01-07 ENCOUNTER — Other Ambulatory Visit (INDEPENDENT_AMBULATORY_CARE_PROVIDER_SITE_OTHER): Payer: Medicare Other

## 2015-01-07 ENCOUNTER — Ambulatory Visit (INDEPENDENT_AMBULATORY_CARE_PROVIDER_SITE_OTHER): Payer: Medicare Other | Admitting: Internal Medicine

## 2015-01-07 ENCOUNTER — Encounter: Payer: Self-pay | Admitting: Internal Medicine

## 2015-01-07 VITALS — BP 130/84 | HR 59 | Ht 65.0 in | Wt 286.2 lb

## 2015-01-07 DIAGNOSIS — R0602 Shortness of breath: Secondary | ICD-10-CM | POA: Diagnosis not present

## 2015-01-07 DIAGNOSIS — R0600 Dyspnea, unspecified: Secondary | ICD-10-CM | POA: Insufficient documentation

## 2015-01-07 DIAGNOSIS — I2510 Atherosclerotic heart disease of native coronary artery without angina pectoris: Secondary | ICD-10-CM

## 2015-01-07 LAB — CBC WITH DIFFERENTIAL/PLATELET
BASOS ABS: 0 10*3/uL (ref 0.0–0.1)
Basophils Relative: 0.3 % (ref 0.0–3.0)
EOS PCT: 3.6 % (ref 0.0–5.0)
Eosinophils Absolute: 0.4 10*3/uL (ref 0.0–0.7)
HCT: 44 % (ref 36.0–46.0)
HEMOGLOBIN: 14.5 g/dL (ref 12.0–15.0)
LYMPHS ABS: 1.8 10*3/uL (ref 0.7–4.0)
Lymphocytes Relative: 14.7 % (ref 12.0–46.0)
MCHC: 33 g/dL (ref 30.0–36.0)
MCV: 84 fl (ref 78.0–100.0)
MONO ABS: 0.7 10*3/uL (ref 0.1–1.0)
Monocytes Relative: 6.2 % (ref 3.0–12.0)
NEUTROS PCT: 75.2 % (ref 43.0–77.0)
Neutro Abs: 9 10*3/uL — ABNORMAL HIGH (ref 1.4–7.7)
Platelets: 286 10*3/uL (ref 150.0–400.0)
RBC: 5.24 Mil/uL — AB (ref 3.87–5.11)
RDW: 15.1 % (ref 11.5–15.5)
WBC: 12 10*3/uL — AB (ref 4.0–10.5)

## 2015-01-07 LAB — SEDIMENTATION RATE: SED RATE: 25 mm/h — AB (ref 0–22)

## 2015-01-07 LAB — BRAIN NATRIURETIC PEPTIDE: Pro B Natriuretic peptide (BNP): 304 pg/mL — ABNORMAL HIGH (ref 0.0–100.0)

## 2015-01-07 NOTE — Patient Instructions (Addendum)
Your main problem with your breathing tests is related to your weight, not your lungs or the amiodarone  Please remember to go to the lab and x-ray department downstairs for your tests - we will call you with the results when they are available.  We will arrange follow up according to what we find on the tests ordered   Pulmonary f/u needed only if any reduction in present stated ex tol

## 2015-01-07 NOTE — Progress Notes (Signed)
Subjective:    Patient ID: [name], female    DOB: [birth year],    MRN: [id number]  HPI  25 yowm never smoker severe   obesity complicated by osa and hbp > afib > amiodarone started May 2016 > abn pfts > 01/07/2015   Pulmonary clinic eval    01/07/2015 1st  Pulmonary office visit/ Elleanor Guyett   Chief Complaint  Patient presents with  . PULMONARY CONSULT    pt referred by Sharilyn Sites for abnormal PFT: pt states for the past 6 months shes had an increase in SOB with the littlest exertion. pt states when she has that SOB she feels like her chest will explode. no c/o cough or wheezing,   does ok grocery shopping only  leaning on cart, can't make it one room to next or sweep stores or grocery from car s sob  Ex cp resolved on amiodarone but abn pfts so referred   In termos of sob no obvious   day to day or daytime variabilty or assoc chronic cough or cp or chest tightness, subjective wheeze overt sinus or hb symptoms. No unusual exp hx or h/o childhood pna/ asthma or knowledge of premature birth.  Sleeping ok without nocturnal  or early am exacerbation  of respiratory  c/o's or need for noct saba. Also denies any obvious fluctuation of symptoms with weather or environmental changes or other aggravating or alleviating factors except as outlined above   Current Medications, Allergies, Complete Past Medical History, Past Surgical History, Family History, and Social History were reviewed in [employer] record.             Review of Systems  Constitutional: Negative for fever and unexpected weight change.  HENT: Positive for sinus pressure and sneezing. Negative for congestion, dental problem, ear pain, nosebleeds, postnasal drip, rhinorrhea, sore throat and trouble swallowing.   Eyes: Negative for redness and itching.  Respiratory: Positive for chest tightness and shortness of breath. Negative for cough and wheezing.   Cardiovascular: Positive for leg swelling.  Negative for palpitations.  Gastrointestinal: Negative for nausea and vomiting.  Genitourinary: Negative for dysuria.  Musculoskeletal: Negative for joint swelling.  Skin: Negative for rash.  Neurological: Positive for headaches.  Hematological: Does not bruise/bleed easily.  Psychiatric/Behavioral: Negative for dysphoric mood. The patient is not nervous/anxious.        Objective:   Physical Exam  Somber amb wf nad  Wt Readings from Last 3 Encounters:  01/07/15 286 lb 3.2 oz (129.819 kg)  01/05/15 289 lb 9 oz (131.345 kg)  12/03/14 284 lb 6.4 oz (129.003 kg)    Vital signs reviewed  HEENT: nl dentition, turbinates, and oropharynx. Nl external ear canals without cough reflex   NECK :  without JVD/Nodes/TM/ nl carotid upstrokes bilaterally   LUNGS: no acc muscle use,  Nl contour chest which is clear to A and P bilaterally without cough on insp or exp maneuvers   CV:  RRR  no s3 or murmur or increase in P2, bilateral lower ext edema 1+ pitting   ABD: marked obesity but  soft and nontender with nl inspiratory excursion in the supine position. No bruits or organomegaly, bowel sounds nl  MS:  Nl gait/ ext warm without deformities, calf tenderness, cyanosis or clubbing No obvious joint restrictions   SKIN: warm and dry without lesions    NEURO:  alert, approp, nl sensorium with  no motor deficits    CXR PA and Lateral:  01/07/2015 :    I personally reviewed images and agree with radiology impression as follows:   Stable cardiomegaly. No acute cardiopulmonary abnormality.   Labs ordered/ reviewed:   Lab 01/07/15 1202  HGB 14.5  HCT 44.0  WBC 12.0*  PLT 286.0     Lab Results  Component Value Date   TSH 3.637 12/03/2014     Lab Results  Component Value Date   PROBNP 304.0* 01/07/2015       Lab Results  Component Value Date   ESRSEDRATE 25* 01/07/2015         Assessment & Plan:

## 2015-01-08 ENCOUNTER — Encounter: Payer: Self-pay | Admitting: Internal Medicine

## 2015-01-08 NOTE — Assessment & Plan Note (Signed)
Body mass index is 47.63 kg/(m^2).  Lab Results  Component Value Date   TSH 3.637 12/03/2014     Contributing to   doe/reviewed the need and the process to achieve and maintain neg calorie balance > defer f/u primary care including intermittently monitoring thyroid status

## 2015-01-08 NOTE — Assessment & Plan Note (Addendum)
-  amiodarone started May 2016 - PFT's  12/13/14 wnl except for erv 10% - 01/07/2015  Walked RA x 3 laps @ 185 ft each stopped due to sob no desats/ nl pace   Her esr is wnl and pfts are only significant for low erv which is classic for effects of obesity/ not ILD from amiodarone  Having said that, she is still at risk: Patients typically have been on amiodarone for 6-12 months before this complication manifests.  Of note, serial clinical evaluation for symptoms such as cough dyspnea or fevers is  the preferred method of monitoring for pulmonary toxicity because a decrease in DLCO or lung volumes is a nonspecific for toxicity. Pathologically amiodarone pulmonary toxicity may appear as interstitial pneumonitis, eosinophilic pneumonia, organizing pneumonia, pulmonary fibrosis or less commonly as diffuse alveolar hemorrhage, pulmonary nodules or pleural effusions.  Risk factors for pulmonary toxicity include age greater than 57, daily dose greater than equal to 400 mg, a high cumulative dose, or pre-existing lung disease    Her main risk is age related.  rec regular ex and f/u if any reduction in ex tol   Total time devoted to counseling  = 35/52m ov  review case with pt/ discussion of options/alternatives/ giving and going over instructions (see avs)

## 2015-01-10 ENCOUNTER — Telehealth: Payer: Self-pay | Admitting: Internal Medicine

## 2015-01-10 NOTE — Telephone Encounter (Signed)
Result Note     Call pt: Reviewed cxr and no acute change so no change in recommendations made at ov   Result Note     Call patient : Studies are unremarkable, no change in recs   Spoke with pt, aware of lab and cxr results.  Nothing further needed at this time.

## 2015-01-17 ENCOUNTER — Ambulatory Visit (INDEPENDENT_AMBULATORY_CARE_PROVIDER_SITE_OTHER): Payer: Medicare Other

## 2015-01-17 DIAGNOSIS — I481 Persistent atrial fibrillation: Secondary | ICD-10-CM | POA: Diagnosis not present

## 2015-01-17 DIAGNOSIS — Z5181 Encounter for therapeutic drug level monitoring: Secondary | ICD-10-CM | POA: Diagnosis not present

## 2015-01-17 DIAGNOSIS — I4891 Unspecified atrial fibrillation: Secondary | ICD-10-CM | POA: Diagnosis not present

## 2015-01-17 DIAGNOSIS — Z7901 Long term (current) use of anticoagulants: Secondary | ICD-10-CM

## 2015-01-17 DIAGNOSIS — I4819 Other persistent atrial fibrillation: Secondary | ICD-10-CM

## 2015-01-17 LAB — POCT INR: INR: 2

## 2015-01-31 ENCOUNTER — Other Ambulatory Visit: Payer: Self-pay | Admitting: Cardiology

## 2015-02-17 ENCOUNTER — Ambulatory Visit (INDEPENDENT_AMBULATORY_CARE_PROVIDER_SITE_OTHER): Payer: Medicare Other

## 2015-02-17 DIAGNOSIS — I481 Persistent atrial fibrillation: Secondary | ICD-10-CM | POA: Diagnosis not present

## 2015-02-17 DIAGNOSIS — I4891 Unspecified atrial fibrillation: Secondary | ICD-10-CM

## 2015-02-17 DIAGNOSIS — Z5181 Encounter for therapeutic drug level monitoring: Secondary | ICD-10-CM | POA: Diagnosis not present

## 2015-02-17 DIAGNOSIS — Z7901 Long term (current) use of anticoagulants: Secondary | ICD-10-CM | POA: Diagnosis not present

## 2015-02-17 DIAGNOSIS — I4819 Other persistent atrial fibrillation: Secondary | ICD-10-CM

## 2015-02-17 LAB — POCT INR: INR: 2.6

## 2015-03-04 ENCOUNTER — Ambulatory Visit (HOSPITAL_COMMUNITY)
Admission: RE | Admit: 2015-03-04 | Discharge: 2015-03-04 | Disposition: A | Discharge status: Home / Self Care | Payer: Medicare Other | Source: Ambulatory Visit | Attending: Nurse Practitioner | Admitting: Nurse Practitioner

## 2015-03-04 ENCOUNTER — Encounter (HOSPITAL_COMMUNITY): Payer: Self-pay | Admitting: Nurse Practitioner

## 2015-03-04 VITALS — BP 128/82 | HR 77 | Ht 65.0 in | Wt 282.0 lb

## 2015-03-04 DIAGNOSIS — I110 Hypertensive heart disease with heart failure: Secondary | ICD-10-CM | POA: Diagnosis not present

## 2015-03-04 DIAGNOSIS — Z7901 Long term (current) use of anticoagulants: Secondary | ICD-10-CM | POA: Diagnosis not present

## 2015-03-04 DIAGNOSIS — Z8249 Family history of ischemic heart disease and other diseases of the circulatory system: Secondary | ICD-10-CM | POA: Insufficient documentation

## 2015-03-04 DIAGNOSIS — Z79899 Other long term (current) drug therapy: Secondary | ICD-10-CM | POA: Insufficient documentation

## 2015-03-04 DIAGNOSIS — I5032 Chronic diastolic (congestive) heart failure: Secondary | ICD-10-CM | POA: Insufficient documentation

## 2015-03-04 DIAGNOSIS — I482 Chronic atrial fibrillation: Secondary | ICD-10-CM | POA: Insufficient documentation

## 2015-03-04 DIAGNOSIS — Z955 Presence of coronary angioplasty implant and graft: Secondary | ICD-10-CM | POA: Insufficient documentation

## 2015-03-04 DIAGNOSIS — I481 Persistent atrial fibrillation: Secondary | ICD-10-CM

## 2015-03-04 DIAGNOSIS — E785 Hyperlipidemia, unspecified: Secondary | ICD-10-CM | POA: Insufficient documentation

## 2015-03-04 DIAGNOSIS — I252 Old myocardial infarction: Secondary | ICD-10-CM | POA: Insufficient documentation

## 2015-03-04 DIAGNOSIS — N289 Disorder of kidney and ureter, unspecified: Secondary | ICD-10-CM | POA: Diagnosis not present

## 2015-03-04 DIAGNOSIS — Z6841 Body Mass Index (BMI) 40.0 and over, adult: Secondary | ICD-10-CM | POA: Insufficient documentation

## 2015-03-04 DIAGNOSIS — I4819 Other persistent atrial fibrillation: Secondary | ICD-10-CM

## 2015-03-04 DIAGNOSIS — G4730 Sleep apnea, unspecified: Secondary | ICD-10-CM | POA: Insufficient documentation

## 2015-03-04 DIAGNOSIS — I2510 Atherosclerotic heart disease of native coronary artery without angina pectoris: Secondary | ICD-10-CM | POA: Diagnosis not present

## 2015-03-04 DIAGNOSIS — Z888 Allergy status to other drugs, medicaments and biological substances status: Secondary | ICD-10-CM | POA: Insufficient documentation

## 2015-03-04 LAB — COMPREHENSIVE METABOLIC PANEL
ALK PHOS: 80 U/L (ref 38–126)
ALT: 17 U/L (ref 14–54)
ANION GAP: 9 (ref 5–15)
AST: 21 U/L (ref 15–41)
Albumin: 3.7 g/dL (ref 3.5–5.0)
BILIRUBIN TOTAL: 0.6 mg/dL (ref 0.3–1.2)
BUN: 20 mg/dL (ref 6–20)
CALCIUM: 10 mg/dL (ref 8.9–10.3)
CO2: 31 mmol/L (ref 22–32)
Chloride: 101 mmol/L (ref 101–111)
Creatinine, Ser: 1.34 mg/dL — ABNORMAL HIGH (ref 0.44–1.00)
GFR calc non Af Amer: 39 mL/min — ABNORMAL LOW (ref >60)
GFR, EST AFRICAN AMERICAN: 45 mL/min — AB (ref >60)
Glucose, Bld: 101 mg/dL — ABNORMAL HIGH (ref 65–99)
Potassium: 4.8 mmol/L (ref 3.5–5.1)
SODIUM: 141 mmol/L (ref 135–145)
TOTAL PROTEIN: 7.2 g/dL (ref 6.5–8.1)

## 2015-03-04 LAB — TSH: TSH: 4.191 u[IU]/mL (ref 0.350–4.500)

## 2015-03-04 MED ORDER — METOPROLOL TARTRATE 50 MG PO TABS: 50.0000 mg | ORAL_TABLET | Freq: Two times a day (BID) | ORAL | Status: DC

## 2015-03-04 MED ORDER — AMIODARONE HCL 200 MG PO TABS: 200.0000 mg | ORAL_TABLET | Freq: Every day | ORAL | Status: DC

## 2015-03-04 NOTE — Patient Instructions (Signed)
Your physician has recommended you make the following change in your medication:  1)Decrease metoprolol 50mg  (1 tablet) twice daily

## 2015-03-04 NOTE — Progress Notes (Signed)
Patient ID: [name], female   DOB: Jul 03, 1945, 70 y.o.   MRN: [id number]     Primary Care Physician: Ruthe Mannan, MD Referring Physician: Dr. Hetty Ely is a 70 y.o. female with a h/o of CAD s/p PCI 2010, PAF, chronic diastolic CHF, HTN, HL, and apical hypertrophy. She is here today for  follow-up of in the afib clinic.    She has been placed on amiodarone for rate control. She has failed amiodarone in the past. She has done "much better" and is not aware of episodes of afib. She is in afib, but rate controlled. In fact, she feels like her heart rate may be too slow at times causing her to feel sluggish.   She has chronic dyspnea with exertion, probably mutifactorial. This was no better with sinus rhythm. She has sleep apnea, wears cpap regularly. She saw  Dr. Sharee Pimple for borderline abnormal PFT's and he thought no issue with lungs or amiodarone, obesity was contributing to her breathing issues.She is limited in her exercise ability due to bilateral knee replacement. She is  c/o chronic fatigue.  Today, she denies symptoms of palpitations, chest pain, shortness of breath, orthopnea, PND, lower extremity edema, dizziness, presyncope, syncope, or neurologic sequela. The patient is tolerating medications without difficulties and is otherwise without complaint today.   Past Medical History  Diagnosis Date  . Hyperlipidemia   . Hypertension   . Osteoporosis   . PAF (paroxysmal atrial fibrillation) (HCC)     a. s/p multiple cardioversions;  b. 2010: on Tikosyn - dose decreased from to in setting of NSTEMI/QT prolongation. Did not hold NSR. c. admitted 08/2011 with loading at BID with DCCV at that time;  d. Chronic coumadin (CHA2DS2VASc = 5). e. 06/2014: Joice Lofts stopped due to ineffective; started on amiodarone.  [name] CAD (coronary artery disease)     a. 07/2008 NSTEMI ->Cath: LM nl, LAD 70p/90p, 92m, LCX nl, RCA nl, EF 60%.  The LAD was stented with a 2.25x68mm  Veri-Flex BMS.  b. 8/15 cath patent stent, nonobstructive disease otherwise - unchanged. Elevated troponin felt due to demand ischemia in setting of AF-RVR. c. Elevated trop 06/2014 felt due to demand ischemia.  . Duodenitis   . Headache(784.0)   . Chronic diastolic CHF (congestive heart failure) (HCC)     a. 07/2008 Echo: EF 60-65%, Triv AI/MR, Mild TR;  b. 09/2013 Echo: EF 60-65%, mild conc LVH.  . Morbid obesity (HCC)   . Difficult intubation   . History of positive PPD   . Arthritis   . Renal insufficiency     a. Baseline Cr appears 1.1-1.2.   Past Surgical History  Procedure Laterality Date  . Cholecystectomy    . Abdominal hysterectomy      partial, bleeding  . Knee arthroscopy  2000 & 2001    left and right  . Esophagogastroduodenoscopy  2003  . Coronary stent placement    . Tubal ligation    . Total knee arthroplasty      bilateral  . Left heart catheterization with coronary angiogram N/A 09/24/2013    Procedure: LEFT HEART CATHETERIZATION WITH CORONARY ANGIOGRAM;  Surgeon: Micheline Chapman, MD;  [facility] [place], LAD stent patent, mLAD 50-60%, CFX [place], RCA 30-40%, EF 70%  . Coronary angioplasty with stent placement  2010    2.75- x 20-mm VeriFlex DES to the pLAD    Current Outpatient Prescriptions  Medication Sig Dispense Refill  . acetaminophen (TYLENOL) 500  MG tablet Take 1,000 mg by mouth every 6 (six) hours as needed for headache.    [name] amiodarone (PACERONE) 200 MG tablet Take 1 tablet (200 mg total) by mouth daily. 70 tablet 2  . furosemide (LASIX) 40 MG tablet Take 1 tablet (40 mg total) by mouth daily. 30 tablet 3  . HYDROcodone-homatropine (HYCODAN) 5-1.5 MG/5ML syrup Take 5 mLs by mouth every 8 (eight) hours as needed for cough. 120 mL 0  . magnesium oxide (MAG-OX) 400 (241.3 MG) MG tablet TAKE 1 TABLET (400 MG TOTAL) BY MOUTH DAILY. 90 tablet 1  . magnesium oxide (MAG-OX) 400 MG tablet Take 1 tablet (400 mg total) by mouth daily. (Patient taking differently: Take 400 mg by  mouth as needed. Pt takes as needed if there is diarrhea.) 90 tablet 3  . metoprolol (LOPRESSOR) 50 MG tablet Take 1 tablet (50 mg total) by mouth 2 (two) times daily. 90 tablet 11  . potassium chloride (K-DUR,KLOR-CON) 10 MEQ tablet Take 1 tablet (10 mEq total) by mouth 2 (two) times daily. 180 tablet 2  . warfarin (COUMADIN) 2.5 MG tablet Take 1 tablet (2.5 mg total) by mouth daily at 6 PM. Or as directed by Coumadin Clinic 30 tablet 3   No current facility-administered medications for this encounter.    Allergies  Allergen Reactions  . Rosuvastatin Other (See Comments)    REACTION: muscle and joint pain  . Statins Other (See Comments)    Muscle and joint pain    Social History   Social History  . Marital Status: Widowed    Spouse Name: N/A  . Number of Children: 2  . Years of Education: N/A   Occupational History  . retired    Social History Main Topics  . Smoking status: Never Smoker   . Smokeless tobacco: Never Used  . Alcohol Use: No  . Drug Use: No  . Sexual Activity: Not Currently    Birth Control/ Protection: Post-menopausal   Other Topics Concern  . Not on file   Social History Narrative   Lives in [place], [place] w/ husband.    Family History  Problem Relation Age of Onset  . Other Mother     GI Bleed  . Pneumonia Father   . Coronary artery disease Brother   . Breast cancer Paternal Grandmother   . Rectal cancer Paternal Grandfather     ROS- All systems are reviewed and negative except as per the HPI above  Physical Exam: Filed Vitals:   03/04/15 0932  BP: 128/82  Pulse: 77  Height: 5\' 5"  (1.651 m)  Weight: 282 lb (127.914 kg)    GEN- The patient is well appearing, alert and oriented x 3 today.   Head- normocephalic, atraumatic Eyes-  Sclera clear, conjunctiva pink Ears- hearing intact Oropharynx- clear Neck- supple, no JVP Lymph- no cervical lymphadenopathy Lungs- Clear to ausculation bilaterally, normal work of breathing Heart- Iregular  rate and rhythm, no murmurs, rubs or gallops, PMI not laterally displaced GI- soft, NT, ND, + BS Extremities- no clubbing, cyanosis, or edema MS- no significant deformity or atrophy Skin- no rash or lesion Psych- euthymic mood, full affect Neuro- strength and sensation are intact  EKG-Afib at 77 bpm, ST/T wave abnormalities, unchanged from previous. QRS int 86 ms Epic records reviewed   Assessment and Plan: 1. Chronic afib Rate controlled with amiodarone Labs today, tsh/liver Try to reduce metoprolol to 50 mg bid to see if helps with sluggish feeling  2. Sleep apnea Wearing  cpap  3. Obesity Limited in exercise ability due to knee issues  F/u in 2 weeks for EKG/BP check only 6 months f/u in afib clinic Dr. Antoine Poche as on recall  early 2017  Elvina Sidle. [facility] [address] [place], [place] [address] [phone]

## 2015-03-17 ENCOUNTER — Ambulatory Visit (HOSPITAL_COMMUNITY)
Admission: RE | Admit: 2015-03-17 | Discharge: 2015-03-17 | Disposition: A | Discharge status: Home / Self Care | Payer: Medicare Other | Source: Ambulatory Visit | Attending: Nurse Practitioner | Admitting: Nurse Practitioner

## 2015-03-17 ENCOUNTER — Ambulatory Visit (INDEPENDENT_AMBULATORY_CARE_PROVIDER_SITE_OTHER): Payer: Medicare Other

## 2015-03-17 DIAGNOSIS — I4891 Unspecified atrial fibrillation: Secondary | ICD-10-CM

## 2015-03-17 DIAGNOSIS — Z7901 Long term (current) use of anticoagulants: Secondary | ICD-10-CM | POA: Diagnosis not present

## 2015-03-17 DIAGNOSIS — I481 Persistent atrial fibrillation: Secondary | ICD-10-CM | POA: Diagnosis not present

## 2015-03-17 DIAGNOSIS — Z5181 Encounter for therapeutic drug level monitoring: Secondary | ICD-10-CM | POA: Diagnosis not present

## 2015-03-17 DIAGNOSIS — I482 Chronic atrial fibrillation: Secondary | ICD-10-CM | POA: Diagnosis not present

## 2015-03-17 DIAGNOSIS — I4819 Other persistent atrial fibrillation: Secondary | ICD-10-CM

## 2015-03-17 DIAGNOSIS — R9431 Abnormal electrocardiogram [ECG] [EKG]: Secondary | ICD-10-CM | POA: Diagnosis not present

## 2015-03-17 LAB — POCT INR: INR: 2.1

## 2015-03-17 NOTE — Progress Notes (Addendum)
Pt in for ekg/bp check after decreasing metoprolol dosing due to feeling sluggish. Rudi Coco NP to review ekg    EKG reviewed with reduction of metoprolol. Pt does not feel afib and is on amio for rate control. No significant change in HR control with lower dose of BB. May feel alittle better. F/u in 6 months.

## 2015-04-11 ENCOUNTER — Ambulatory Visit (INDEPENDENT_AMBULATORY_CARE_PROVIDER_SITE_OTHER): Payer: Medicare Other | Admitting: Family Medicine

## 2015-04-11 ENCOUNTER — Encounter: Payer: Self-pay | Admitting: Family Medicine

## 2015-04-11 VITALS — BP 138/86 | HR 79 | Temp 98.1°F | Wt 285.8 lb

## 2015-04-11 DIAGNOSIS — R350 Frequency of micturition: Secondary | ICD-10-CM | POA: Diagnosis not present

## 2015-04-11 LAB — POC URINALSYSI DIPSTICK (AUTOMATED)
BILIRUBIN UA: NEGATIVE
GLUCOSE UA: NEGATIVE
KETONES UA: NEGATIVE
LEUKOCYTES UA: NEGATIVE
Nitrite, UA: NEGATIVE
Protein, UA: NEGATIVE
Urobilinogen, UA: 0.2
pH, UA: 6

## 2015-04-11 MED ORDER — CEPHALEXIN 500 MG PO CAPS: 500.0000 mg | ORAL_CAPSULE | Freq: Two times a day (BID) | ORAL | Status: DC

## 2015-04-11 NOTE — Progress Notes (Signed)
Pre visit review using our clinic review tool, if applicable. No additional management support is needed unless otherwise documented below in the visit note. 

## 2015-04-11 NOTE — Progress Notes (Signed)
SUBJECTIVE: [name] is a 70 y.o. female who complains of urinary frequency, urgency and dysuria x 2 days, without flank pain, fever, chills, or abnormal vaginal discharge or bleeding.   Current Outpatient Prescriptions on File Prior to Visit  Medication Sig Dispense Refill  . acetaminophen (TYLENOL) 500 MG tablet Take 1,000 mg by mouth every 6 (six) hours as needed for headache.    [name] amiodarone (PACERONE) 200 MG tablet Take 1 tablet (200 mg total) by mouth daily. 70 tablet 2  . furosemide (LASIX) 40 MG tablet Take 1 tablet (40 mg total) by mouth daily. 30 tablet 3  . magnesium oxide (MAG-OX) 400 MG tablet Take 1 tablet (400 mg total) by mouth daily. (Patient taking differently: Take 400 mg by mouth as needed. Pt takes as needed if there is diarrhea.) 90 tablet 3  . metoprolol (LOPRESSOR) 50 MG tablet Take 1 tablet (50 mg total) by mouth 2 (two) times daily. 90 tablet 11  . potassium chloride (K-DUR,KLOR-CON) 10 MEQ tablet Take 1 tablet (10 mEq total) by mouth 2 (two) times daily. 180 tablet 2  . warfarin (COUMADIN) 2.5 MG tablet Take 1 tablet (2.5 mg total) by mouth daily at 6 PM. Or as directed by Coumadin Clinic 30 tablet 3   No current facility-administered medications on file prior to visit.    Allergies  Allergen Reactions  . Rosuvastatin Other (See Comments)    REACTION: muscle and joint pain  . Statins Other (See Comments)    Muscle and joint pain    Past Medical History  Diagnosis Date  . Hyperlipidemia   . Hypertension   . Osteoporosis   . PAF (paroxysmal atrial fibrillation) (HCC)     a. s/p multiple cardioversions;  b. 2010: on Tikosyn - dose decreased from to in setting of NSTEMI/QT prolongation. Did not hold NSR. c. admitted 08/2011 with loading at BID with DCCV at that time;  d. Chronic coumadin (CHA2DS2VASc = 5). e. 06/2014: Joice Lofts stopped due to ineffective; started on amiodarone.  [name] CAD (coronary artery disease)     a. 07/2008 NSTEMI ->Cath: LM  nl, LAD 70p/90p, 68m, LCX nl, RCA nl, EF 60%.  The LAD was stented with a 2.25x59mm Veri-Flex BMS.  b. 8/15 cath patent stent, nonobstructive disease otherwise - unchanged. Elevated troponin felt due to demand ischemia in setting of AF-RVR. c. Elevated trop 06/2014 felt due to demand ischemia.  . Duodenitis   . Headache(784.0)   . Chronic diastolic CHF (congestive heart failure) (HCC)     a. 07/2008 Echo: EF 60-65%, Triv AI/MR, Mild TR;  b. 09/2013 Echo: EF 60-65%, mild conc LVH.  . Morbid obesity (HCC)   . Difficult intubation   . History of positive PPD   . Arthritis   . Renal insufficiency     a. Baseline Cr appears 1.1-1.2.    Past Surgical History  Procedure Laterality Date  . Cholecystectomy    . Abdominal hysterectomy      partial, bleeding  . Knee arthroscopy  2000 & 2001    left and right  . Esophagogastroduodenoscopy  2003  . Coronary stent placement    . Tubal ligation    . Total knee arthroplasty      bilateral  . Left heart catheterization with coronary angiogram N/A 09/24/2013    Procedure: LEFT HEART CATHETERIZATION WITH CORONARY ANGIOGRAM;  Surgeon: Micheline Chapman, MD;  [facility] [place], LAD stent patent, mLAD 50-60%, CFX [place], RCA 30-40%, EF 70%  .  Coronary angioplasty with stent placement  2010    2.75- x 20-mm VeriFlex DES to the pLAD    Family History  Problem Relation Age of Onset  . Other Mother     GI Bleed  . Pneumonia Father   . Coronary artery disease Brother   . Breast cancer Paternal Grandmother   . Rectal cancer Paternal Grandfather     Social History   Social History  . Marital Status: Widowed    Spouse Name: N/A  . Number of Children: 2  . Years of Education: N/A   Occupational History  . retired    Social History Main Topics  . Smoking status: Never Smoker   . Smokeless tobacco: Never Used  . Alcohol Use: No  . Drug Use: No  . Sexual Activity: Not Currently    Birth Control/ Protection: Post-menopausal   Other Topics Concern  . Not on  file   Social History Narrative   Lives in [place], [place] w/ husband.   The PMH, PSH, Social History, Family History, Medications, and allergies have been reviewed in [facility], and have been updated if relevant.  OBJECTIVE: BP 138/86 mmHg  Pulse 79  Temp(Src) 98.1 F (36.7 C) (Oral)  Wt 285 lb 12 oz (129.615 kg)  SpO2 95%   Appears well, in no apparent distress.  Vital signs are normal. The abdomen is soft without tenderness, guarding, mass, rebound or organomegaly. No CVA tenderness or inguinal adenopathy noted. Urine dipstick shows positive for RBC's.   ASSESSMENT: UTI uncomplicated without evidence of pyelonephritis  PLAN: Treatment per orders - Keflex 500 mg twice daily x 7 days, also push fluids, may use Pyridium OTC prn. Call or return to clinic prn if these symptoms worsen or fail to improve as anticipated.

## 2015-04-11 NOTE — Patient Instructions (Signed)
Great to see you. Please take Keflex as directed- 1 tablet twice daily x 7 days.  Urinary Tract Infection Urinary tract infections (UTIs) can develop anywhere along your urinary tract. Your urinary tract is your body's drainage system for removing wastes and extra water. Your urinary tract includes two kidneys, two ureters, a bladder, and a urethra. Your kidneys are a pair of bean-shaped organs. Each kidney is about the size of your fist. They are located below your ribs, one on each side of your spine. CAUSES Infections are caused by microbes, which are microscopic organisms, including fungi, viruses, and bacteria. These organisms are so small that they can only be seen through a microscope. Bacteria are the microbes that most commonly cause UTIs. SYMPTOMS  Symptoms of UTIs may vary by age and gender of the patient and by the location of the infection. Symptoms in young women typically include a frequent and intense urge to urinate and a painful, burning feeling in the bladder or urethra during urination. Older women and men are more likely to be tired, shaky, and weak and have muscle aches and abdominal pain. A fever may mean the infection is in your kidneys. Other symptoms of a kidney infection include pain in your back or sides below the ribs, nausea, and vomiting. DIAGNOSIS To diagnose a UTI, your caregiver will ask you about your symptoms. Your caregiver will also ask you to provide a urine sample. The urine sample will be tested for bacteria and white blood cells. White blood cells are made by your body to help fight infection. TREATMENT  Typically, UTIs can be treated with medication. Because most UTIs are caused by a bacterial infection, they usually can be treated with the use of antibiotics. The choice of antibiotic and length of treatment depend on your symptoms and the type of bacteria causing your infection. HOME CARE INSTRUCTIONS  If you were prescribed antibiotics, take them exactly as  your caregiver instructs you. Finish the medication even if you feel better after you have only taken some of the medication.  Drink enough water and fluids to keep your urine clear or pale yellow.  Avoid caffeine, tea, and carbonated beverages. They tend to irritate your bladder.  Empty your bladder often. Avoid holding urine for long periods of time.  Empty your bladder before and after sexual intercourse.  After a bowel movement, women should cleanse from front to back. Use each tissue only once. SEEK MEDICAL CARE IF:   You have back pain.  You develop a fever.  Your symptoms do not begin to resolve within 3 days. SEEK IMMEDIATE MEDICAL CARE IF:   You have severe back pain or lower abdominal pain.  You develop chills.  You have nausea or vomiting.  You have continued burning or discomfort with urination. MAKE SURE YOU:   Understand these instructions.  Will watch your condition.  Will get help right away if you are not doing well or get worse.   This information is not intended to replace advice given to you by your health care provider. Make sure you discuss any questions you have with your health care provider.   Document Released: 11/01/2004 Document Revised: 10/13/2014 Document Reviewed: 03/02/2011 Elsevier Interactive Patient Education [employer]

## 2015-04-11 NOTE — Addendum Note (Signed)
Addended by: Desmond Dike on: 04/11/2015 12:03 PM   Modules accepted: Orders

## 2015-04-13 LAB — URINE CULTURE: Colony Count: >=100000

## 2015-04-14 ENCOUNTER — Ambulatory Visit (INDEPENDENT_AMBULATORY_CARE_PROVIDER_SITE_OTHER): Payer: Medicare Other | Admitting: *Deleted

## 2015-04-14 DIAGNOSIS — Z7901 Long term (current) use of anticoagulants: Secondary | ICD-10-CM | POA: Diagnosis not present

## 2015-04-14 DIAGNOSIS — I4891 Unspecified atrial fibrillation: Secondary | ICD-10-CM | POA: Diagnosis not present

## 2015-04-14 DIAGNOSIS — I481 Persistent atrial fibrillation: Secondary | ICD-10-CM

## 2015-04-14 DIAGNOSIS — Z5181 Encounter for therapeutic drug level monitoring: Secondary | ICD-10-CM

## 2015-04-14 DIAGNOSIS — I4819 Other persistent atrial fibrillation: Secondary | ICD-10-CM

## 2015-04-14 LAB — POCT INR: INR: 2.1

## 2015-04-14 MED ORDER — WARFARIN SODIUM 2.5 MG PO TABS: 2.5000 mg | ORAL_TABLET | Freq: Every day | ORAL | Status: DC

## 2015-05-24 DIAGNOSIS — H5211 Myopia, right eye: Secondary | ICD-10-CM | POA: Diagnosis not present

## 2015-05-24 DIAGNOSIS — H40011 Open angle with borderline findings, low risk, right eye: Secondary | ICD-10-CM | POA: Diagnosis not present

## 2015-05-24 DIAGNOSIS — H2589 Other age-related cataract: Secondary | ICD-10-CM | POA: Diagnosis not present

## 2015-05-24 DIAGNOSIS — H524 Presbyopia: Secondary | ICD-10-CM | POA: Diagnosis not present

## 2015-05-24 DIAGNOSIS — H26491 Other secondary cataract, right eye: Secondary | ICD-10-CM | POA: Diagnosis not present

## 2015-05-26 ENCOUNTER — Ambulatory Visit (INDEPENDENT_AMBULATORY_CARE_PROVIDER_SITE_OTHER): Payer: Medicare Other | Admitting: *Deleted

## 2015-05-26 DIAGNOSIS — I4891 Unspecified atrial fibrillation: Secondary | ICD-10-CM | POA: Diagnosis not present

## 2015-05-26 DIAGNOSIS — Z7901 Long term (current) use of anticoagulants: Secondary | ICD-10-CM

## 2015-05-26 DIAGNOSIS — I481 Persistent atrial fibrillation: Secondary | ICD-10-CM

## 2015-05-26 DIAGNOSIS — I4819 Other persistent atrial fibrillation: Secondary | ICD-10-CM

## 2015-05-26 DIAGNOSIS — Z5181 Encounter for therapeutic drug level monitoring: Secondary | ICD-10-CM

## 2015-05-26 LAB — POCT INR: INR: 2.3

## 2015-06-16 ENCOUNTER — Telehealth: Payer: Self-pay | Admitting: Family Medicine

## 2015-06-16 ENCOUNTER — Telehealth: Payer: Self-pay | Admitting: Internal Medicine

## 2015-06-16 DIAGNOSIS — G4730 Sleep apnea, unspecified: Secondary | ICD-10-CM

## 2015-06-16 NOTE — Telephone Encounter (Signed)
Received message from [facility] [place] stating that patient needs order for Chin Strap. Order entered. Melissa notified. Nothing further needed.

## 2015-06-16 NOTE — Telephone Encounter (Signed)
Signed and returned to [facility].

## 2015-06-16 NOTE — Telephone Encounter (Signed)
Pt dropped off handicap form to be filled out In rx tower for dr Dayton Martes Pt would like this to be mailed to her she attached self addressed stamp envelope Please advise pt when done

## 2015-07-07 ENCOUNTER — Ambulatory Visit (INDEPENDENT_AMBULATORY_CARE_PROVIDER_SITE_OTHER): Payer: Medicare Other | Admitting: *Deleted

## 2015-07-07 DIAGNOSIS — I481 Persistent atrial fibrillation: Secondary | ICD-10-CM | POA: Diagnosis not present

## 2015-07-07 DIAGNOSIS — I4891 Unspecified atrial fibrillation: Secondary | ICD-10-CM | POA: Diagnosis not present

## 2015-07-07 DIAGNOSIS — Z7901 Long term (current) use of anticoagulants: Secondary | ICD-10-CM

## 2015-07-07 DIAGNOSIS — Z5181 Encounter for therapeutic drug level monitoring: Secondary | ICD-10-CM

## 2015-07-07 DIAGNOSIS — I4819 Other persistent atrial fibrillation: Secondary | ICD-10-CM

## 2015-07-07 LAB — POCT INR: INR: 2.4

## 2015-07-16 ENCOUNTER — Other Ambulatory Visit: Payer: Self-pay | Admitting: Cardiology

## 2015-07-18 NOTE — Telephone Encounter (Signed)
Rx request sent to pharmacy.  

## 2015-07-22 ENCOUNTER — Telehealth: Payer: Self-pay | Admitting: Cardiology

## 2015-07-22 MED ORDER — AMIODARONE HCL 200 MG PO TABS: 200.0000 mg | ORAL_TABLET | Freq: Every day | ORAL | Status: DC

## 2015-07-22 NOTE — Telephone Encounter (Signed)
Dorathy Daft called in wanting to speak with a nurse about the pt's Amiodarone medication. The patient was not aware of a change in administering the drug so Dorathy Daft is just wanting to get clarity.

## 2015-07-22 NOTE — Telephone Encounter (Signed)
Reviewed medication rx. Clerical error resolved, Rx resubmitted electronically. Caller Kayla at [facility] is aware. I have also notified her I am sending notification to patient to schedule office visit to see Dr. Antoine Poche - overdue visit. All instructions acknowledged.

## 2015-07-26 ENCOUNTER — Other Ambulatory Visit (HOSPITAL_COMMUNITY): Payer: Self-pay | Admitting: *Deleted

## 2015-07-26 MED ORDER — AMIODARONE HCL 200 MG PO TABS: 200.0000 mg | ORAL_TABLET | Freq: Every day | ORAL | Status: DC

## 2015-08-18 ENCOUNTER — Ambulatory Visit (INDEPENDENT_AMBULATORY_CARE_PROVIDER_SITE_OTHER): Payer: Medicare Other | Admitting: *Deleted

## 2015-08-18 ENCOUNTER — Encounter (INDEPENDENT_AMBULATORY_CARE_PROVIDER_SITE_OTHER): Payer: Self-pay

## 2015-08-18 DIAGNOSIS — Z5181 Encounter for therapeutic drug level monitoring: Secondary | ICD-10-CM

## 2015-08-18 DIAGNOSIS — I4891 Unspecified atrial fibrillation: Secondary | ICD-10-CM

## 2015-08-18 DIAGNOSIS — Z7901 Long term (current) use of anticoagulants: Secondary | ICD-10-CM

## 2015-08-18 DIAGNOSIS — I481 Persistent atrial fibrillation: Secondary | ICD-10-CM | POA: Diagnosis not present

## 2015-08-18 DIAGNOSIS — I4819 Other persistent atrial fibrillation: Secondary | ICD-10-CM

## 2015-08-18 LAB — POCT INR: INR: 2

## 2015-09-13 ENCOUNTER — Encounter: Payer: Self-pay | Admitting: Family Medicine

## 2015-09-13 ENCOUNTER — Ambulatory Visit (INDEPENDENT_AMBULATORY_CARE_PROVIDER_SITE_OTHER): Payer: Medicare Other | Admitting: Family Medicine

## 2015-09-13 VITALS — BP 144/96 | HR 63 | Temp 98.3°F | Wt 275.8 lb

## 2015-09-13 DIAGNOSIS — N760 Acute vaginitis: Secondary | ICD-10-CM | POA: Diagnosis not present

## 2015-09-13 LAB — POC URINALSYSI DIPSTICK (AUTOMATED)
BILIRUBIN UA: NEGATIVE
Glucose, UA: NEGATIVE
KETONES UA: NEGATIVE
Leukocytes, UA: NEGATIVE
Nitrite, UA: NEGATIVE
PH UA: 6
PROTEIN UA: NEGATIVE
SPEC GRAV UA: 1.02
Urobilinogen, UA: 0.2

## 2015-09-13 MED ORDER — TERCONAZOLE 0.4 % VA CREA: 1.0000 | TOPICAL_CREAM | Freq: Every day | VAGINAL | 0 refills | Status: DC

## 2015-09-13 NOTE — Progress Notes (Signed)
SUBJECTIVE:  70 y.o. female complains of vaginal erythema noted and vulvular itching and vaginal discharge for 1 week(s). Denies abnormal vaginal bleeding or significant pelvic pain or fever. No UTI symptoms. Denies history of known exposure to STD.  Current Outpatient Prescriptions on File Prior to Visit  Medication Sig Dispense Refill  . acetaminophen (TYLENOL) 500 MG tablet Take 1,000 mg by mouth every 6 (six) hours as needed for headache.    [name] amiodarone (PACERONE) 200 MG tablet Take 1 tablet (200 mg total) by mouth daily. 30 tablet 6  . furosemide (LASIX) 40 MG tablet Take 1 tablet (40 mg total) by mouth daily. 30 tablet 3  . magnesium oxide (MAG-OX) 400 MG tablet Take 1 tablet (400 mg total) by mouth daily. (Patient taking differently: Take 400 mg by mouth as needed. Pt takes as needed if there is diarrhea.) 90 tablet 3  . metoprolol (LOPRESSOR) 50 MG tablet Take 1 tablet (50 mg total) by mouth 2 (two) times daily. 90 tablet 11  . potassium chloride (K-DUR,KLOR-CON) 10 MEQ tablet Take 1 tablet (10 mEq total) by mouth 2 (two) times daily. 180 tablet 2  . warfarin (COUMADIN) 2.5 MG tablet Take 1 tablet (2.5 mg total) by mouth daily at 6 PM. Or as directed by [facility] 90 tablet 1   No current facility-administered medications on file prior to visit.     Allergies  Allergen Reactions  . Rosuvastatin Other (See Comments)    REACTION: muscle and joint pain  . Statins Other (See Comments)    Muscle and joint pain    Past Medical History:  Diagnosis Date  . Arthritis   . CAD (coronary artery disease)    a. 07/2008 NSTEMI ->Cath: LM nl, LAD 70p/90p, 30m, LCX nl, RCA nl, EF 60%.  The LAD was stented with a 2.25x69mm Veri-Flex BMS.  b. 8/15 cath patent stent, nonobstructive disease otherwise - unchanged. Elevated troponin felt due to demand ischemia in setting of AF-RVR. c. Elevated trop 06/2014 felt due to demand ischemia.  . Chronic diastolic CHF (congestive heart failure) (HCC)    a. 07/2008 Echo: EF 60-65%, Triv AI/MR, Mild TR;  b. 09/2013 Echo: EF 60-65%, mild conc LVH.  [name] Difficult intubation   . Duodenitis   . Headache(784.0)   . History of positive PPD   . Hyperlipidemia   . Hypertension   . Morbid obesity (HCC)   . Osteoporosis   . PAF (paroxysmal atrial fibrillation) (HCC)    a. s/p multiple cardioversions;  b. 2010: on Tikosyn - dose decreased from to in setting of NSTEMI/QT prolongation. Did not hold NSR. c. admitted 08/2011 with loading at BID with DCCV at that time;  d. Chronic coumadin (CHA2DS2VASc = 5). e. 06/2014: Joice Lofts stopped due to ineffective; started on amiodarone.  . Renal insufficiency    a. Baseline Cr appears 1.1-1.2.    Past Surgical History:  Procedure Laterality Date  . ABDOMINAL HYSTERECTOMY     partial, bleeding  . CHOLECYSTECTOMY    . CORONARY ANGIOPLASTY WITH STENT PLACEMENT  2010   2.75- x 20-mm VeriFlex DES to the pLAD  . CORONARY STENT PLACEMENT    . ESOPHAGOGASTRODUODENOSCOPY  2003  . KNEE ARTHROSCOPY  2000 & 2001   left and right  . LEFT HEART CATHETERIZATION WITH CORONARY ANGIOGRAM N/A 09/24/2013   Procedure: LEFT HEART CATHETERIZATION WITH CORONARY ANGIOGRAM;  Surgeon: Micheline Chapman, MD;  [facility] [place], LAD stent patent, mLAD 50-60%, CFX [place], RCA 30-40%, EF  70%  . TOTAL KNEE ARTHROPLASTY     bilateral  . TUBAL LIGATION      Family History  Problem Relation Age of Onset  . Other Mother     GI Bleed  . Pneumonia Father   . Coronary artery disease Brother   . Breast cancer Paternal Grandmother   . Rectal cancer Paternal Grandfather     Social History   Social History  . Marital status: Widowed    Spouse name: N/A  . Number of children: 2  . Years of education: N/A   Occupational History  . retired Retired   Social History Main Topics  . Smoking status: Never Smoker  . Smokeless tobacco: Never Used  . Alcohol use No  . Drug use: No  . Sexual activity: Not Currently    Birth control/  protection: Post-menopausal   Other Topics Concern  . Not on file   Social History Narrative   Lives in [place], [place] w/ husband.   The PMH, PSH, Social History, Family History, Medications, and allergies have been reviewed in [facility], and have been updated if relevant.  No LMP recorded. Patient has had a hysterectomy.  OBJECTIVE:  BP (!) 144/96   Pulse 63   Temp 98.3 F (36.8 C) (Oral)   Wt 275 lb 12 oz (125.1 kg)   SpO2 97%   BMI 45.89 kg/m   She appears well, afebrile. Abdomen: benign, soft, nontender, no masses. Pelvic Exam: VULVA: vulvar erythema   ASSESSMENT:  Probable yeast vaginitis  PLAN:  Wet prep sent. Treatment: Terazol cream ROV prn if symptoms persist or worsen.

## 2015-09-13 NOTE — Patient Instructions (Signed)
Good to see you. Please use terazol as directed. We will call you with your results from today.

## 2015-09-13 NOTE — Progress Notes (Signed)
Pre visit review using our clinic review tool, if applicable. No additional management support is needed unless otherwise documented below in the visit note. 

## 2015-09-14 LAB — WET PREP BY MOLECULAR PROBE
CANDIDA SPECIES: NEGATIVE
Gardnerella vaginalis: NEGATIVE
Trichomonas vaginosis: NEGATIVE

## 2015-09-22 ENCOUNTER — Ambulatory Visit (INDEPENDENT_AMBULATORY_CARE_PROVIDER_SITE_OTHER): Payer: Medicare Other | Admitting: Pharmacist

## 2015-09-22 DIAGNOSIS — I4891 Unspecified atrial fibrillation: Secondary | ICD-10-CM | POA: Diagnosis not present

## 2015-09-22 DIAGNOSIS — Z7901 Long term (current) use of anticoagulants: Secondary | ICD-10-CM

## 2015-09-22 DIAGNOSIS — I481 Persistent atrial fibrillation: Secondary | ICD-10-CM | POA: Diagnosis not present

## 2015-09-22 DIAGNOSIS — Z5181 Encounter for therapeutic drug level monitoring: Secondary | ICD-10-CM

## 2015-09-22 DIAGNOSIS — I4819 Other persistent atrial fibrillation: Secondary | ICD-10-CM

## 2015-09-22 LAB — POCT INR: INR: 2.1

## 2015-09-26 ENCOUNTER — Ambulatory Visit (HOSPITAL_COMMUNITY): Payer: Medicare Other | Admitting: Nurse Practitioner

## 2015-09-30 ENCOUNTER — Other Ambulatory Visit: Payer: Self-pay

## 2015-10-02 ENCOUNTER — Other Ambulatory Visit: Payer: Self-pay | Admitting: Cardiology

## 2015-10-03 ENCOUNTER — Encounter (HOSPITAL_COMMUNITY): Payer: Self-pay | Admitting: Nurse Practitioner

## 2015-10-03 ENCOUNTER — Ambulatory Visit (HOSPITAL_COMMUNITY)
Admission: RE | Admit: 2015-10-03 | Discharge: 2015-10-03 | Disposition: A | Discharge status: Home / Self Care | Payer: Medicare Other | Source: Ambulatory Visit | Attending: Nurse Practitioner | Admitting: Nurse Practitioner

## 2015-10-03 VITALS — BP 124/76 | HR 71 | Ht 65.0 in | Wt 274.4 lb

## 2015-10-03 DIAGNOSIS — I2510 Atherosclerotic heart disease of native coronary artery without angina pectoris: Secondary | ICD-10-CM | POA: Diagnosis not present

## 2015-10-03 DIAGNOSIS — I5032 Chronic diastolic (congestive) heart failure: Secondary | ICD-10-CM | POA: Diagnosis not present

## 2015-10-03 DIAGNOSIS — I481 Persistent atrial fibrillation: Secondary | ICD-10-CM | POA: Diagnosis not present

## 2015-10-03 DIAGNOSIS — Z8249 Family history of ischemic heart disease and other diseases of the circulatory system: Secondary | ICD-10-CM | POA: Diagnosis not present

## 2015-10-03 DIAGNOSIS — I110 Hypertensive heart disease with heart failure: Secondary | ICD-10-CM | POA: Diagnosis not present

## 2015-10-03 DIAGNOSIS — Z96653 Presence of artificial knee joint, bilateral: Secondary | ICD-10-CM | POA: Diagnosis not present

## 2015-10-03 DIAGNOSIS — Z79899 Other long term (current) drug therapy: Secondary | ICD-10-CM | POA: Insufficient documentation

## 2015-10-03 DIAGNOSIS — I4819 Other persistent atrial fibrillation: Secondary | ICD-10-CM

## 2015-10-03 DIAGNOSIS — I252 Old myocardial infarction: Secondary | ICD-10-CM | POA: Diagnosis not present

## 2015-10-03 DIAGNOSIS — Z7901 Long term (current) use of anticoagulants: Secondary | ICD-10-CM | POA: Insufficient documentation

## 2015-10-03 DIAGNOSIS — Z888 Allergy status to other drugs, medicaments and biological substances status: Secondary | ICD-10-CM | POA: Insufficient documentation

## 2015-10-03 DIAGNOSIS — G4730 Sleep apnea, unspecified: Secondary | ICD-10-CM | POA: Diagnosis not present

## 2015-10-03 DIAGNOSIS — Z6841 Body Mass Index (BMI) 40.0 and over, adult: Secondary | ICD-10-CM | POA: Diagnosis not present

## 2015-10-03 DIAGNOSIS — E785 Hyperlipidemia, unspecified: Secondary | ICD-10-CM | POA: Diagnosis not present

## 2015-10-03 DIAGNOSIS — I482 Chronic atrial fibrillation: Secondary | ICD-10-CM | POA: Diagnosis not present

## 2015-10-03 DIAGNOSIS — M1990 Unspecified osteoarthritis, unspecified site: Secondary | ICD-10-CM | POA: Diagnosis not present

## 2015-10-03 LAB — HEPATIC FUNCTION PANEL
ALT: 23 U/L (ref 14–54)
AST: 29 U/L (ref 15–41)
Albumin: 3.7 g/dL (ref 3.5–5.0)
Alkaline Phosphatase: 59 U/L (ref 38–126)
BILIRUBIN DIRECT: 0.1 mg/dL (ref 0.1–0.5)
BILIRUBIN INDIRECT: 0.7 mg/dL (ref 0.3–0.9)
BILIRUBIN TOTAL: 0.8 mg/dL (ref 0.3–1.2)
Total Protein: 6.4 g/dL — ABNORMAL LOW (ref 6.5–8.1)

## 2015-10-03 LAB — TSH: TSH: 2.632 u[IU]/mL (ref 0.350–4.500)

## 2015-10-03 MED ORDER — AMIODARONE HCL 200 MG PO TABS: 200.0000 mg | ORAL_TABLET | Freq: Every day | ORAL | 3 refills | Status: DC

## 2015-10-03 MED ORDER — MAGNESIUM OXIDE 400 MG PO TABS: 400.0000 mg | ORAL_TABLET | Freq: Every day | ORAL | 3 refills | Status: DC

## 2015-10-03 NOTE — Telephone Encounter (Signed)
Rx request sent to pharmacy.  

## 2015-10-03 NOTE — Progress Notes (Signed)
Patient ID: Burnadette PeterArlene B Mcmorris, female   DOB: 04-Jul-1945, 70 y.o.   MRN: [id number]     Primary Care Physician: Ruthe Mannanalia Aron, MD Referring Physician: Dr. Hetty ElyAllred   Maye B Biber is a 70 y.o. female with a h/o of CAD s/p PCI 2010, PAF, chronic diastolic CHF, HTN, HL, and apical hypertrophy. She is here today for  follow-up of in the afib clinic.    She has been placed on amiodarone for rate control by Dr. Johney FrameAllred in 2016. She has failed amiodarone/tikosyn in the past. She has done "much better" and is not aware of episodes of afib. She is in afib, but rate controlled and does not really feel irregular heart beat.   She has chronic dyspnea with exertion, probably mutifactorial. This was no better with sinus rhythm. She has sleep apnea, wears cpap regularly. She saw  Dr. Sharee PimpleWirt for borderline abnormal PFT's and he thought no issue with lungs or amiodarone, obesity was contributing to her breathing issues.She is limited in her exercise ability due to bilateral knee replacement. She is  c/o chronic fatigue.Continuew   Today, she denies symptoms of palpitations, chest pain,  orthopnea, PND, lower extremity edema, dizziness, presyncope, syncope, or neurologic sequela.  Positive for chronic shortness of breath/fatigue.The patient is tolerating medications without difficulties and is otherwise without complaint today.   Past Medical History:  Diagnosis Date  . Arthritis   . CAD (coronary artery disease)    a. 07/2008 NSTEMI ->Cath: LM nl, LAD 70p/90p, 1451m, LCX nl, RCA nl, EF 60%.  The LAD was stented with a 2.25x6020mm Veri-Flex BMS.  b. 8/15 cath patent stent, nonobstructive disease otherwise - unchanged. Elevated troponin felt due to demand ischemia in setting of AF-RVR. c. Elevated trop 06/2014 felt due to demand ischemia.  . Chronic diastolic CHF (congestive heart failure) (HCC)    a. 07/2008 Echo: EF 60-65%, Triv AI/MR, Mild TR;  b. 09/2013 Echo: EF 60-65%, mild conc LVH.  [name]. Difficult intubation   . Duodenitis    . Headache(784.0)   . History of positive PPD   . Hyperlipidemia   . Hypertension   . Morbid obesity (HCC)   . Osteoporosis   . PAF (paroxysmal atrial fibrillation) (HCC)    a. s/p multiple cardioversions;  b. 2010: on Tikosyn - dose decreased from 500mcg to 251mcg in setting of NSTEMI/QT prolongation. Did not hold NSR. c. admitted 08/2011 with loading at 500mcg BID with DCCV at that time;  d. Chronic coumadin (CHA2DS2VASc = 5). e. 06/2014: Joice Loftstikosyn stopped due to ineffective; started on amiodarone.  . Renal insufficiency    a. Baseline Cr appears 1.1-1.2.   Past Surgical History:  Procedure Laterality Date  . ABDOMINAL HYSTERECTOMY     partial, bleeding  . CHOLECYSTECTOMY    . CORONARY ANGIOPLASTY WITH STENT PLACEMENT  2010   2.75- x 20-mm VeriFlex DES to the pLAD  . CORONARY STENT PLACEMENT    . ESOPHAGOGASTRODUODENOSCOPY  2003  . KNEE ARTHROSCOPY  2000 & 2001   left and right  . LEFT HEART CATHETERIZATION WITH CORONARY ANGIOGRAM N/A 09/24/2013   Procedure: LEFT HEART CATHETERIZATION WITH CORONARY ANGIOGRAM;  Surgeon: Micheline ChapmanMichael D Cooper, MD;  [facility] [place] LAD stent patent, mLAD 50-60%, CFX [place], RCA 30-40%, EF 70%  . TOTAL KNEE ARTHROPLASTY     bilateral  . TUBAL LIGATION      Current Outpatient Prescriptions  Medication Sig Dispense Refill  . acetaminophen (TYLENOL) 500 MG tablet Take 1,000 mg by mouth every 6 (six)  hours as needed for headache.    [name] amiodarone (PACERONE) 200 MG tablet Take 1 tablet (200 mg total) by mouth daily. 90 tablet 3  . furosemide (LASIX) 40 MG tablet Take 1 tablet (40 mg total) by mouth daily. 30 tablet 3  . magnesium oxide (MAG-OX) 400 MG tablet Take 1 tablet (400 mg total) by mouth daily. 90 tablet 3  . metoprolol (LOPRESSOR) 50 MG tablet Take 1 tablet (50 mg total) by mouth 2 (two) times daily. 90 tablet 11  . potassium chloride (K-DUR,KLOR-CON) 10 MEQ tablet Take 1 tablet (10 mEq total) by mouth 2 (two) times daily. (Patient taking differently: Take 10 mEq  by mouth once a week. ) 180 tablet 2  . terconazole (TERAZOL 7) 0.4 % vaginal cream Place 1 applicator vaginally at bedtime. 45 g 0  . warfarin (COUMADIN) 2.5 MG tablet Take 1 tablet (2.5 mg total) by mouth daily at 6 PM. Or as directed by [facility] 90 tablet 1   No current facility-administered medications for this encounter.     Allergies  Allergen Reactions  . Rosuvastatin Other (See Comments)    REACTION: muscle and joint pain  . Statins Other (See Comments)    Muscle and joint pain    Social History   Social History  . Marital status: Widowed    Spouse name: N/A  . Number of children: 2  . Years of education: N/A   Occupational History  . retired Retired   Social History Main Topics  . Smoking status: Never Smoker  . Smokeless tobacco: Never Used  . Alcohol use No  . Drug use: No  . Sexual activity: Not Currently    Birth control/ protection: Post-menopausal   Other Topics Concern  . Not on file   Social History Narrative   Lives in [place], [place] w/ husband.    Family History  Problem Relation Age of Onset  . Other Mother     GI Bleed  . Pneumonia Father   . Coronary artery disease Brother   . Breast cancer Paternal Grandmother   . Rectal cancer Paternal Grandfather     ROS- All systems are reviewed and negative except as per the HPI above  Physical Exam: Vitals:   10/03/15 1003  BP: 124/76  Pulse: 71  Weight: 274 lb 6.4 oz (124.5 kg)  Height: 5\' 5"  (1.651 m)    GEN- The patient is well appearing, alert and oriented x 3 today.   Head- normocephalic, atraumatic Eyes-  Sclera clear, conjunctiva pink Ears- hearing intact Oropharynx- clear Neck- supple, no JVP Lymph- no cervical lymphadenopathy Lungs- Clear to ausculation bilaterally, normal work of breathing Heart- Irregular rate and rhythm, no murmurs, rubs or gallops, PMI not laterally displaced GI- soft, NT, ND, + BS Extremities- no clubbing, cyanosis, or edema MS- no significant  deformity or atrophy Skin- no rash or lesion Psych- euthymic mood, full affect Neuro- strength and sensation are intact  EKG-Afib at 71 bpm, ST/T wave abnormalities, unchanged from previous. QRS int 92 ms, Qtc 452 ms Epic records reviewed   Assessment and Plan: 1. Chronic afib Rate controlled with amiodarone Labs today, tsh/liver Continue metoprolol to 50 mg bid   Update echo last done in 2015 since she is in chronic afib to see if EF is preserved  2. Sleep apnea Wearing cpap F/u's with pulmonology in October for this  3. Obesity Limited in exercise ability due to knee issues   6 months f/u in afib clinic Dr.  Hochrein as on recall   Elvina Sidle. [facility] [address] [place], [place] [address] [phone]

## 2015-10-06 DIAGNOSIS — M79642 Pain in left hand: Secondary | ICD-10-CM | POA: Diagnosis not present

## 2015-10-16 ENCOUNTER — Other Ambulatory Visit (HOSPITAL_COMMUNITY): Payer: Self-pay | Admitting: Nurse Practitioner

## 2015-10-28 ENCOUNTER — Other Ambulatory Visit: Payer: Self-pay | Admitting: Family Medicine

## 2015-10-28 DIAGNOSIS — Z23 Encounter for immunization: Secondary | ICD-10-CM | POA: Diagnosis not present

## 2015-10-28 DIAGNOSIS — Z1231 Encounter for screening mammogram for malignant neoplasm of breast: Secondary | ICD-10-CM

## 2015-11-02 ENCOUNTER — Ambulatory Visit (INDEPENDENT_AMBULATORY_CARE_PROVIDER_SITE_OTHER): Payer: Medicare Other

## 2015-11-02 ENCOUNTER — Other Ambulatory Visit: Payer: Self-pay | Admitting: Family Medicine

## 2015-11-02 VITALS — BP 122/80 | HR 69 | Temp 98.0°F | Ht 64.0 in | Wt 270.0 lb

## 2015-11-02 DIAGNOSIS — E7800 Pure hypercholesterolemia, unspecified: Secondary | ICD-10-CM | POA: Diagnosis not present

## 2015-11-02 DIAGNOSIS — E2839 Other primary ovarian failure: Secondary | ICD-10-CM

## 2015-11-02 DIAGNOSIS — Z1159 Encounter for screening for other viral diseases: Secondary | ICD-10-CM | POA: Diagnosis not present

## 2015-11-02 DIAGNOSIS — E785 Hyperlipidemia, unspecified: Secondary | ICD-10-CM | POA: Diagnosis not present

## 2015-11-02 DIAGNOSIS — I10 Essential (primary) hypertension: Secondary | ICD-10-CM | POA: Diagnosis not present

## 2015-11-02 DIAGNOSIS — Z0000 Encounter for general adult medical examination without abnormal findings: Secondary | ICD-10-CM | POA: Insufficient documentation

## 2015-11-02 DIAGNOSIS — Z01419 Encounter for gynecological examination (general) (routine) without abnormal findings: Secondary | ICD-10-CM

## 2015-11-02 DIAGNOSIS — Z23 Encounter for immunization: Secondary | ICD-10-CM

## 2015-11-02 LAB — COMPREHENSIVE METABOLIC PANEL
ALT: 21 U/L (ref 0–35)
AST: 27 U/L (ref 0–37)
Albumin: 4 g/dL (ref 3.5–5.2)
Alkaline Phosphatase: 70 U/L (ref 39–117)
BILIRUBIN TOTAL: 0.7 mg/dL (ref 0.2–1.2)
BUN: 22 mg/dL (ref 6–23)
CALCIUM: 10 mg/dL (ref 8.4–10.5)
CO2: 32 meq/L (ref 19–32)
CREATININE: 1.34 mg/dL — AB (ref 0.40–1.20)
Chloride: 101 mEq/L (ref 96–112)
GFR: 41.28 mL/min — ABNORMAL LOW (ref >60.00)
Glucose, Bld: 88 mg/dL (ref 70–99)
Potassium: 4.5 mEq/L (ref 3.5–5.1)
SODIUM: 140 meq/L (ref 135–145)
Total Protein: 7.4 g/dL (ref 6.0–8.3)

## 2015-11-02 LAB — LIPID PANEL
CHOL/HDL RATIO: 4
Cholesterol: 189 mg/dL (ref 0–200)
HDL: 48.4 mg/dL (ref >39.00)
LDL Cholesterol: 116 mg/dL — ABNORMAL HIGH (ref 0–99)
NONHDL: 140.54
TRIGLYCERIDES: 121 mg/dL (ref 0.0–149.0)
VLDL: 24.2 mg/dL (ref 0.0–40.0)

## 2015-11-02 LAB — TSH: TSH: 3.37 u[IU]/mL (ref 0.35–4.50)

## 2015-11-02 NOTE — Progress Notes (Signed)
Pre visit review using our clinic review tool, if applicable. No additional management support is needed unless otherwise documented below in the visit note. 

## 2015-11-02 NOTE — Patient Instructions (Signed)
Ms. Eaves , Thank you for taking time to come for your Medicare Wellness Visit. I appreciate your ongoing commitment to your health goals. Please review the following plan we discussed and let me know if I can assist you in the future.   These are the goals we discussed: Goals    . continue with weight management          Starting 11/02/2015, I will resume attending Weight Watchers meetings at least once weekly.        This is a list of the screening recommended for you and due dates:  Health Maintenance  Topic Date Due  . Colon Cancer Screening  02/05/2016*  . DEXA scan (bone density measurement)  11/01/2016*  . Tetanus Vaccine  11/01/2016*  . Mammogram  08/20/2018*  . Shingles Vaccine  11/01/2025*  . Flu Shot  Addressed  .  Hepatitis C: One time screening is recommended by Center for Disease Control  (CDC) for  adults born from [birth year] through 1967.   Completed  . Pneumonia vaccines  Completed  *Topic was postponed. The date shown is not the original due date.   Preventive Care for Adults  A healthy lifestyle and preventive care can promote health and wellness. Preventive health guidelines for adults include the following key practices.  . A routine yearly physical is a good way to check with your health care provider about your health and preventive screening. It is a chance to share any concerns and updates on your health and to receive a thorough exam.  . Visit your dentist for a routine exam and preventive care every 6 months. Brush your teeth twice a day and floss once a day. Good oral hygiene prevents tooth decay and gum disease.  . The frequency of eye exams is based on your age, health, family medical history, use  of contact lenses, and other factors. Follow your health care provider's ecommendations for frequency of eye exams.  . Eat a healthy diet. Foods like vegetables, fruits, whole grains, low-fat dairy products, and lean protein foods contain the nutrients you need  without too many calories. Decrease your intake of foods high in solid fats, added sugars, and salt. Eat the right amount of calories for you. Get information about a proper diet from your health care provider, if necessary.  . Regular physical exercise is one of the most important things you can do for your health. Most adults should get at least 150 minutes of moderate-intensity exercise (any activity that increases your heart rate and causes you to sweat) each week. In addition, most adults need muscle-strengthening exercises on 2 or more days a week.  Silver Sneakers may be a benefit available to you. To determine eligibility, you may visit the website: www.silversneakers.com or contact program at (540)153-9405 Mon-Fri between 8AM-8PM.   . Maintain a healthy weight. The body mass index (BMI) is a screening tool to identify possible weight problems. It provides an estimate of body fat based on height and weight. Your health care provider can find your BMI and can help you achieve or maintain a healthy weight.   For adults 20 years and older: ? A BMI below 18.5 is considered underweight. ? A BMI of 18.5 to 24.9 is normal. ? A BMI of 25 to 29.9 is considered overweight. ? A BMI of 30 and above is considered obese.   . Maintain normal blood lipids and cholesterol levels by exercising and minimizing your intake of saturated fat. Eat a balanced  diet with plenty of fruit and vegetables. Blood tests for lipids and cholesterol should begin at age 2 and be repeated every 5 years. If your lipid or cholesterol levels are high, you are over 50, or you are at high risk for heart disease, you may need your cholesterol levels checked more frequently. Ongoing high lipid and cholesterol levels should be treated with medicines if diet and exercise are not working.  . If you smoke, find out from your health care provider how to quit. If you do not use tobacco, please do not start.  . If you choose to drink  alcohol, please do not consume more than 2 drinks per day. One drink is considered to be 12 ounces (355 mL) of beer, 5 ounces (148 mL) of wine, or 1.5 ounces (44 mL) of liquor.  . If you are 76-79 years old, ask your health care provider if you should take aspirin to prevent strokes.  . Use sunscreen. Apply sunscreen liberally and repeatedly throughout the day. You should seek shade when your shadow is shorter than you. Protect yourself by wearing long sleeves, pants, a wide-brimmed hat, and sunglasses year round, whenever you are outdoors.  . Once a month, do a whole body skin exam, using a mirror to look at the skin on your back. Tell your health care provider of new moles, moles that have irregular borders, moles that are larger than a pencil eraser, or moles that have changed in shape or color.

## 2015-11-02 NOTE — Progress Notes (Signed)
PCP notes:   Health maintenance:  PCV13 - administered Colon cancer screening - pt will discuss optimal choice with PCP at next appt Tetanus - postponed/insurance Hep C screening - completed Mammogram - scheduled 11/08/15 Bone density - pt will complete bone density with mammogram in 2018  Abnormal screenings:   Hearing- failed Fall risk - hx of fall with injury  Patient concerns:   Pt reports general fatigue and malaise. Additionally, pt states she has increased episodes of itching, mostly to both arms.   Nurse concerns:  None  Next PCP appt:   11/16/2015 @ 1030

## 2015-11-02 NOTE — Progress Notes (Signed)
Subjective:   [name] is a 70 y.o. female who presents for an Initial Medicare Annual Wellness Visit.  Review of Systems    N/A--  Cardiac Risk Factors include: advanced age (>7255men, 9>65 women);obesity (BMI >30kg/m2);dyslipidemia;hypertension     Objective:    Today's Vitals   11/02/15 1100  BP: 122/80  Pulse: 69  Temp: 98 F (36.7 C)  TempSrc: Oral  SpO2: 96%  Weight: 270 lb (122.5 kg)  Height: 5\' 4"  (1.626 m)  PainSc: 0-No pain   Body mass index is 46.35 kg/m.   Current Medications (verified) Outpatient Encounter Prescriptions as of 11/02/2015  Medication Sig  . acetaminophen (TYLENOL) 500 MG tablet Take 1,000 mg by mouth every 6 (six) hours as needed for headache.  [name]. amiodarone (PACERONE) 200 MG tablet Take 1 tablet (200 mg total) by mouth daily.  . furosemide (LASIX) 40 MG tablet Take 1 tablet (40 mg total) by mouth daily.  . magnesium oxide (MAG-OX) 400 MG tablet Take 1 tablet (400 mg total) by mouth daily.  . metoprolol (LOPRESSOR) 50 MG tablet Take 1 tablet (50 mg total) by mouth 2 (two) times daily.  . potassium chloride (K-DUR,KLOR-CON) 10 MEQ tablet Take 1 tablet (10 mEq total) by mouth 2 (two) times daily. (Patient taking differently: Take 10 mEq by mouth once a week. )  . terconazole (TERAZOL 7) 0.4 % vaginal cream Place 1 applicator vaginally at bedtime.  [name]. warfarin (COUMADIN) 2.5 MG tablet Take 1 tablet (2.5 mg total) by mouth daily at 6 PM. Or as directed by [facility]  . [DISCONTINUED] magnesium oxide (MAG-OX) 400 (241.3 Mg) MG tablet Take 1 tablet (400 mg total) by mouth daily. Please schedule appointment for refills.  . [DISCONTINUED] metoprolol (LOPRESSOR) 50 MG tablet Take 1 tablet (50 mg total) by mouth 2 (two) times daily.   No facility-administered encounter medications on file as of 11/02/2015.     Allergies (verified) Rosuvastatin and Statins   History: Past Medical History:  Diagnosis Date  . Arthritis   . CAD (coronary artery  disease)    a. 07/2008 NSTEMI ->Cath: LM nl, LAD 70p/90p, 3631m, LCX nl, RCA nl, EF 60%.  The LAD was stented with a 2.25x6020mm Veri-Flex BMS.  b. 8/15 cath patent stent, nonobstructive disease otherwise - unchanged. Elevated troponin felt due to demand ischemia in setting of AF-RVR. c. Elevated trop 06/2014 felt due to demand ischemia.  . Chronic diastolic CHF (congestive heart failure) (HCC)    a. 07/2008 Echo: EF 60-65%, Triv AI/MR, Mild TR;  b. 09/2013 Echo: EF 60-65%, mild conc LVH.  [name]. Difficult intubation   . Duodenitis   . Headache(784.0)   . History of positive PPD   . Hyperlipidemia   . Hypertension   . Morbid obesity (HCC)   . Osteoporosis   . PAF (paroxysmal atrial fibrillation) (HCC)    a. s/p multiple cardioversions;  b. 2010: on Tikosyn - dose decreased from 500mcg to 231mcg in setting of NSTEMI/QT prolongation. Did not hold NSR. c. admitted 08/2011 with loading at 500mcg BID with DCCV at that time;  d. Chronic coumadin (CHA2DS2VASc = 5). e. 06/2014: Joice Loftstikosyn stopped due to ineffective; started on amiodarone.  . Renal insufficiency    a. Baseline Cr appears 1.1-1.2.   Past Surgical History:  Procedure Laterality Date  . ABDOMINAL HYSTERECTOMY     partial, bleeding  . CHOLECYSTECTOMY    . CORONARY ANGIOPLASTY WITH STENT PLACEMENT  2010   2.75- x 20-mm VeriFlex DES  to the pLAD  . CORONARY STENT PLACEMENT    . ESOPHAGOGASTRODUODENOSCOPY  2003  . KNEE ARTHROSCOPY  2000 & 2001   left and right  . LEFT HEART CATHETERIZATION WITH CORONARY ANGIOGRAM N/A 09/24/2013   Procedure: LEFT HEART CATHETERIZATION WITH CORONARY ANGIOGRAM;  Surgeon: Micheline Chapman, MD;  [facility] [place], LAD stent patent, mLAD 50-60%, CFX [place], RCA 30-40%, EF 70%  . TOTAL KNEE ARTHROPLASTY     bilateral  . TUBAL LIGATION     Family History  Problem Relation Age of Onset  . Other Mother     GI Bleed  . Pneumonia Father   . Coronary artery disease Brother   . Breast cancer Paternal Grandmother   . Rectal cancer  Paternal Grandfather    Social History   Occupational History  . retired Retired   Social History Main Topics  . Smoking status: Never Smoker  . Smokeless tobacco: Never Used  . Alcohol use No  . Drug use: No  . Sexual activity: Not Currently    Birth control/ protection: Post-menopausal    Tobacco Counseling Counseling given: No   Activities of Daily Living In your present state of health, do you have any difficulty performing the following activities: 11/02/2015  Hearing? Y  Vision? Y  Difficulty concentrating or making decisions? N  Walking or climbing stairs? Y  Dressing or bathing? N  Doing errands, shopping? N  Preparing Food and eating ? N  Using the Toilet? N  In the past six months, have you accidently leaked urine? Y  Do you have problems with loss of bowel control? N  Managing your Medications? N  Managing your Finances? N  Housekeeping or managing your Housekeeping? N  Some recent data might be hidden    Immunizations and Health Maintenance Immunization History  Administered Date(s) Administered  . Influenza Split 10/22/2011  . Influenza Whole 02/05/2005, 12/04/2007, 11/06/2010  . Influenza, High Dose Seasonal PF 11/01/2014  . Influenza,inj,Quad PF,36+ Mos 10/20/2013  . Influenza-Unspecified 11/05/2012  . Pneumococcal Conjugate-13 11/02/2015  . Pneumococcal Polysaccharide-23 09/24/2013  . Td 02/05/2005   There are no preventive care reminders to display for this patient.  Patient Care Team: Dianne Dun, MD as PCP - General Oretha Milch, MD (Pulmonary Disease) Newman Nip, NP as Nurse Practitioner (Nurse Practitioner) Nelson Chimes, MD as Consulting Physician (Ophthalmology) Rollene Rotunda, MD as Consulting Physician (Cardiology) Hillis Range, MD as Consulting Physician (Cardiology)     Assessment:   This is a routine wellness examination for [name].  Hearing/Vision screen  Hearing Screening   125Hz  250Hz  500Hz  1000Hz  2000Hz  3000Hz   4000Hz  6000Hz  8000Hz   Right ear:   0 0 40  0    Left ear:   40 40 40  0    Vision Screening Comments: Last vision exam in April 2017 with Dr. Hazle Quant  Dietary issues and exercise activities discussed: Current Exercise Habits: The patient does not participate in regular exercise at present, Exercise limited by: orthopedic condition(s);cardiac condition(s);respiratory conditions(s)  Goals    . continue with weight management          Starting 11/02/2015, I will resume attending Weight Watchers meetings at least once weekly.       Depression Screen PHQ 2/9 Scores 11/02/2015 03/19/2014 11/16/2013  PHQ - 2 Score 0 0 0    Fall Risk Fall Risk  11/02/2015  Falls in the past year? Yes  Number falls in past yr: 1  Injury with Fall? Yes  Follow  up Falls evaluation completed;Falls prevention discussed    Cognitive Function: MMSE - Mini Mental State Exam 11/02/2015  Orientation to time 5  Orientation to Place 5  Registration 3  Attention/ Calculation 0  Recall 3  Language- name 2 objects 0  Language- repeat 1  Language- follow 3 step command 3  Language- read & follow direction 0  Write a sentence 0  Copy design 0  Total score 20   PLEASE NOTE: A Mini-Cog screen was completed. Maximum score is 20. A value of 0 denotes this part of Folstein MMSE was not completed or the patient failed this part of the Mini-Cog screening.   Mini-Cog Screening Orientation to Time - Max 5 pts Orientation to Place - Max 5 pts Registration - Max 3 pts Recall - Max 3 pts Language Repeat - Max 1 pts Language Follow 3 Step Command - Max 3 pts  Screening Tests Health Maintenance  Topic Date Due  . COLONOSCOPY  02/05/2016 (Originally 06/26/2011)  . DEXA SCAN  11/01/2016 (Originally 06/20/2008)  . TETANUS/TDAP  11/01/2016 (Originally 02/06/2015)  . MAMMOGRAM  08/20/2018 (Originally 01/27/2009)  . ZOSTAVAX  11/01/2025 (Originally 06/21/2003)  . INFLUENZA VACCINE  Addressed  . Hepatitis C Screening  Completed    . PNA vac Low Risk Adult  Completed      Plan:  I have personally reviewed and addressed the Medicare Annual Wellness questionnaire and have noted the following in the patient's chart:  A. Medical and social history B. Use of alcohol, tobacco or illicit drugs  C. Current medications and supplements D. Functional ability and status E.  Nutritional status F.  Physical activity G. Advance directives H. List of other physicians I.  Hospitalizations, surgeries, and ER visits in previous 12 months J.  Vitals K. Screenings to include hearing, vision, cognitive, depression L. Referrals and appointments - none  In addition, I have reviewed and discussed with patient certain preventive protocols, quality metrics, and best practice recommendations. A written personalized care plan for preventive services as well as general preventive health recommendations were provided to patient.  See attached scanned questionnaire for additional information.   Signed,   Randa Evens, MHA, BS, LPN Health Advisor

## 2015-11-02 NOTE — Progress Notes (Signed)
I reviewed health advisor's note, was available for consultation, and agree with documentation and plan.  

## 2015-11-03 ENCOUNTER — Ambulatory Visit (HOSPITAL_COMMUNITY)
Admission: RE | Admit: 2015-11-03 | Discharge: 2015-11-03 | Disposition: A | Discharge status: Home / Self Care | Payer: Medicare Other | Source: Ambulatory Visit | Attending: Nurse Practitioner | Admitting: Nurse Practitioner

## 2015-11-03 DIAGNOSIS — I481 Persistent atrial fibrillation: Secondary | ICD-10-CM | POA: Diagnosis not present

## 2015-11-03 DIAGNOSIS — I517 Cardiomegaly: Secondary | ICD-10-CM | POA: Diagnosis not present

## 2015-11-03 DIAGNOSIS — I420 Dilated cardiomyopathy: Secondary | ICD-10-CM | POA: Insufficient documentation

## 2015-11-03 DIAGNOSIS — I4819 Other persistent atrial fibrillation: Secondary | ICD-10-CM

## 2015-11-03 DIAGNOSIS — I348 Other nonrheumatic mitral valve disorders: Secondary | ICD-10-CM | POA: Insufficient documentation

## 2015-11-03 LAB — HEPATITIS C ANTIBODY: HCV Ab: NEGATIVE

## 2015-11-03 NOTE — Progress Notes (Signed)
  Echocardiogram 2D Echocardiogram has been performed.  [name] 11/03/2015, 11:31 AM

## 2015-11-04 ENCOUNTER — Ambulatory Visit (INDEPENDENT_AMBULATORY_CARE_PROVIDER_SITE_OTHER): Payer: Medicare Other | Admitting: *Deleted

## 2015-11-04 DIAGNOSIS — I4891 Unspecified atrial fibrillation: Secondary | ICD-10-CM | POA: Diagnosis not present

## 2015-11-04 DIAGNOSIS — I4819 Other persistent atrial fibrillation: Secondary | ICD-10-CM

## 2015-11-04 DIAGNOSIS — Z5181 Encounter for therapeutic drug level monitoring: Secondary | ICD-10-CM

## 2015-11-04 DIAGNOSIS — I481 Persistent atrial fibrillation: Secondary | ICD-10-CM

## 2015-11-04 DIAGNOSIS — Z7901 Long term (current) use of anticoagulants: Secondary | ICD-10-CM | POA: Diagnosis not present

## 2015-11-04 LAB — POCT INR: INR: 2.2

## 2015-11-08 ENCOUNTER — Ambulatory Visit
Admission: RE | Admit: 2015-11-08 | Discharge: 2015-11-08 | Disposition: A | Discharge status: Home / Self Care | Payer: Medicare Other | Source: Ambulatory Visit | Attending: Family Medicine | Admitting: Family Medicine

## 2015-11-08 DIAGNOSIS — Z1231 Encounter for screening mammogram for malignant neoplasm of breast: Secondary | ICD-10-CM

## 2015-11-16 ENCOUNTER — Encounter: Payer: Self-pay | Admitting: Family Medicine

## 2015-11-16 ENCOUNTER — Ambulatory Visit (INDEPENDENT_AMBULATORY_CARE_PROVIDER_SITE_OTHER): Payer: Medicare Other | Admitting: Family Medicine

## 2015-11-16 VITALS — BP 128/74 | HR 60 | Temp 98.2°F | Wt 272.8 lb

## 2015-11-16 DIAGNOSIS — I10 Essential (primary) hypertension: Secondary | ICD-10-CM

## 2015-11-16 DIAGNOSIS — G4730 Sleep apnea, unspecified: Secondary | ICD-10-CM

## 2015-11-16 DIAGNOSIS — I2510 Atherosclerotic heart disease of native coronary artery without angina pectoris: Secondary | ICD-10-CM | POA: Diagnosis not present

## 2015-11-16 DIAGNOSIS — I481 Persistent atrial fibrillation: Secondary | ICD-10-CM

## 2015-11-16 DIAGNOSIS — I214 Non-ST elevation (NSTEMI) myocardial infarction: Secondary | ICD-10-CM | POA: Diagnosis not present

## 2015-11-16 DIAGNOSIS — E7800 Pure hypercholesterolemia, unspecified: Secondary | ICD-10-CM

## 2015-11-16 DIAGNOSIS — L299 Pruritus, unspecified: Secondary | ICD-10-CM

## 2015-11-16 DIAGNOSIS — I4819 Other persistent atrial fibrillation: Secondary | ICD-10-CM

## 2015-11-16 DIAGNOSIS — M1993 Secondary osteoarthritis, unspecified site: Secondary | ICD-10-CM

## 2015-11-16 MED ORDER — HYDROXYZINE HCL 10 MG PO TABS: 10.0000 mg | ORAL_TABLET | Freq: Three times a day (TID) | ORAL | 0 refills | Status: DC | PRN

## 2015-11-16 NOTE — Patient Instructions (Addendum)
Great to see you.  Take vistaril up to 3 times daily as needed for itching (this will make you sleepy).

## 2015-11-16 NOTE — Assessment & Plan Note (Signed)
Well controlled 

## 2015-11-16 NOTE — Assessment & Plan Note (Signed)
Well controlled. No changes made to rxs. 

## 2015-11-16 NOTE — Progress Notes (Signed)
Pre visit review using our clinic review tool, if applicable. No additional management support is needed unless otherwise documented below in the visit note. 

## 2015-11-16 NOTE — Assessment & Plan Note (Signed)
Rate controlled and warfarin, betablocker, amiodarone.

## 2015-11-16 NOTE — Assessment & Plan Note (Signed)
Persistent. Hydroxyzine as needed- discussed sedation precauation.

## 2015-11-16 NOTE — Progress Notes (Signed)
Subjective:   Patient ID: [name], female    DOB: 01/10/1946, 70 y.o.   MRN: [id number]  [name] is a pleasant 70 y.o. year old female who presents to clinic today with Follow-up and Pruritis  on 11/16/2015  HPI:  Medicare wellness visit with Lu Duffel on 11/02/15.  Note reviewed.  Followed by cardiology for afib, CAD. On coumadin for anticoagulation and amiodarone, lopressor and lasix. Had echo done last week- stable with no declined in EF.  She has been very itchy - on and off for the past year.  No rash.   Lab Results  Component Value Date   CREATININE 1.34 (H) 11/02/2015   Lab Results  Component Value Date   CHOL 189 11/02/2015   HDL 48.40 11/02/2015   LDLCALC 116 (H) 11/02/2015   TRIG 121.0 11/02/2015   CHOLHDL 4 11/02/2015   Lab Results  Component Value Date   WBC 12.0 (H) 01/07/2015   HGB 14.5 01/07/2015   HCT 44.0 01/07/2015   MCV 84.0 01/07/2015   PLT 286.0 01/07/2015   Lab Results  Component Value Date   TSH 3.37 11/02/2015   Lab Results  Component Value Date   NA 140 11/02/2015   K 4.5 11/02/2015   CL 101 11/02/2015   CO2 32 11/02/2015   Lab Results  Component Value Date   ALT 21 11/02/2015   AST 27 11/02/2015   ALKPHOS 70 11/02/2015   BILITOT 0.7 11/02/2015     Current Outpatient Prescriptions on File Prior to Visit  Medication Sig Dispense Refill  . acetaminophen (TYLENOL) 500 MG tablet Take 1,000 mg by mouth every 6 (six) hours as needed for headache.    [name] amiodarone (PACERONE) 200 MG tablet Take 1 tablet (200 mg total) by mouth daily. 90 tablet 3  . furosemide (LASIX) 40 MG tablet Take 1 tablet (40 mg total) by mouth daily. 30 tablet 3  . magnesium oxide (MAG-OX) 400 MG tablet Take 1 tablet (400 mg total) by mouth daily. 90 tablet 3  . metoprolol (LOPRESSOR) 50 MG tablet Take 1 tablet (50 mg total) by mouth 2 (two) times daily. 60 tablet 3  . potassium chloride (K-DUR,KLOR-CON) 10 MEQ tablet Take 1 tablet (10 mEq total)  by mouth 2 (two) times daily. (Patient taking differently: Take 10 mEq by mouth once a week. ) 180 tablet 2  . terconazole (TERAZOL 7) 0.4 % vaginal cream Place 1 applicator vaginally at bedtime. 45 g 0  . warfarin (COUMADIN) 2.5 MG tablet Take 1 tablet (2.5 mg total) by mouth daily at 6 PM. Or as directed by [facility] 90 tablet 1   No current facility-administered medications on file prior to visit.     Allergies  Allergen Reactions  . Rosuvastatin Other (See Comments)    REACTION: muscle and joint pain  . Statins Other (See Comments)    Muscle and joint pain    Past Medical History:  Diagnosis Date  . Arthritis   . CAD (coronary artery disease)    a. 07/2008 NSTEMI ->Cath: LM nl, LAD 70p/90p, 88m, LCX nl, RCA nl, EF 60%.  The LAD was stented with a 2.25x74mm Veri-Flex BMS.  b. 8/15 cath patent stent, nonobstructive disease otherwise - unchanged. Elevated troponin felt due to demand ischemia in setting of AF-RVR. c. Elevated trop 06/2014 felt due to demand ischemia.  . Chronic diastolic CHF (congestive heart failure) (HCC)    a. 07/2008 Echo: EF 60-65%, Triv AI/MR, Mild TR;  b. 09/2013 Echo: EF 60-65%, mild conc LVH.  [name]. Difficult intubation   . Duodenitis   . Headache(784.0)   . History of positive PPD   . Hyperlipidemia   . Hypertension   . Morbid obesity (HCC)   . Osteoporosis   . PAF (paroxysmal atrial fibrillation) (HCC)    a. s/p multiple cardioversions;  b. 2010: on Tikosyn - dose decreased from 500mcg to 250mcg in setting of NSTEMI/QT prolongation. Did not hold NSR. c. admitted 08/2011 with loading at 500mcg BID with DCCV at that time;  d. Chronic coumadin (CHA2DS2VASc = 5). e. 06/2014: Joice Loftstikosyn stopped due to ineffective; started on amiodarone.  . Renal insufficiency    a. Baseline Cr appears 1.1-1.2.    Past Surgical History:  Procedure Laterality Date  . ABDOMINAL HYSTERECTOMY     partial, bleeding  . CHOLECYSTECTOMY    . CORONARY ANGIOPLASTY WITH STENT PLACEMENT   2010   2.75- x 20-mm VeriFlex DES to the pLAD  . CORONARY STENT PLACEMENT    . ESOPHAGOGASTRODUODENOSCOPY  2003  . KNEE ARTHROSCOPY  2000 & 2001   left and right  . LEFT HEART CATHETERIZATION WITH CORONARY ANGIOGRAM N/A 09/24/2013   Procedure: LEFT HEART CATHETERIZATION WITH CORONARY ANGIOGRAM;  Surgeon: Micheline ChapmanMichael D Cooper, MD;  [facility] [place] L[place] stent patent, mLAD 50-60%, CFX [place], RCA 30-40%, EF 70%  . TOTAL KNEE ARTHROPLASTY     bilateral  . TUBAL LIGATION      Family History  Problem Relation Age of Onset  . Other Mother     GI Bleed  . Pneumonia Father   . Coronary artery disease Brother   . Breast cancer Paternal Grandmother   . Rectal cancer Paternal Grandfather     Social History   Social History  . Marital status: Widowed    Spouse name: N/A  . Number of children: 2  . Years of education: N/A   Occupational History  . retired Retired   Social History Main Topics  . Smoking status: Never Smoker  . Smokeless tobacco: Never Used  . Alcohol use No  . Drug use: No  . Sexual activity: Not Currently    Birth control/ protection: Post-menopausal   Other Topics Concern  . Not on file   Social History Narrative   [place]/ [place].   The PMH, PSH, Social History, Family History, Medications, and allergies [facility], and have been updated if relevant.  *Review of Systems  Constitutional: Negative.   HENT: Negative.   Eyes: Negative.   Respiratory: Negative.   Cardiovascular: Negative.   Gastrointestinal: Negative.   Endocrine: Negative.   Genitourinary: Negative.   Musculoskeletal: Negative.   Skin: Negative.   Allergic/Immunologic: Negative.   Neurological: Negative.   Hematological: Negative.   Psychiatric/Behavioral: Negative.        Objective:    BP 128/74   Pulse 60   Temp 98.2 F (36.8 C) (Oral)   Wt 272 lb 12 oz (123.7 kg)   SpO2 95%   BMI 46.82 kg/m    Physical Exam   General:   Well-developed,well-nourished,in no acute distress; alert,appropriate and cooperative throughout examination Head:  normocephalic and atraumatic.   Eyes:  vision grossly intact, pupils equal, pupils round, and pupils reactive to light.     Lungs:  Normal respiratory effort, chest expands symmetrically. Lungs are clear to auscultation, no crackles or wheezes. Heart: irregularly irregular Msk:  No deformity or scoliosis noted of thoracic or lumbar spine.  Extremities:  No clubbing, cyanosis, edema, or deformity noted with normal full range of motion of all joints.   Neurologic:  alert & oriented X3 and gait normal.   Skin:  Intact without suspicious lesions or rashes Psych:  Cognition and judgment appear intact. Alert and cooperative with normal attention span and concentration. No apparent delusions, illusions, hallucinations       Assessment & Plan:   HYPERCHOLESTEROLEMIA, PURE  Persistent atrial fibrillation (HCC)  NSTEMI (non-ST elevated myocardial infarction) (HCC)  Atherosclerosis of native coronary artery of native heart without angina pectoris  Sleep apnea, unspecified type  Other secondary osteoarthritis, unspecified site  Itching No Follow-up on file.

## 2015-11-21 ENCOUNTER — Inpatient Hospital Stay: Admission: RE | Admit: 2015-11-21 | Payer: Medicare Other | Source: Ambulatory Visit

## 2015-11-29 ENCOUNTER — Ambulatory Visit (INDEPENDENT_AMBULATORY_CARE_PROVIDER_SITE_OTHER): Payer: Medicare Other | Admitting: Internal Medicine

## 2015-11-29 ENCOUNTER — Encounter: Payer: Self-pay | Admitting: Internal Medicine

## 2015-11-29 VITALS — BP 138/84 | HR 62 | Ht 64.0 in | Wt 266.8 lb

## 2015-11-29 DIAGNOSIS — G4733 Obstructive sleep apnea (adult) (pediatric): Secondary | ICD-10-CM

## 2015-11-29 DIAGNOSIS — I2510 Atherosclerotic heart disease of native coronary artery without angina pectoris: Secondary | ICD-10-CM

## 2015-11-29 NOTE — Patient Instructions (Signed)
ICD-9-CM ICD-10-CM   1. Obstructive sleep apnea syndrome 327.23 G47.33   2. Morbid obesity, unspecified obesity type (HCC) 278.01 E66.01     OSA stable Congratulations on weight loss - keep it going through [employer]. This is awesome  Plan  - refill cPAP suppplies  Followup  - 1 year with Dr Vassie Loll

## 2015-11-29 NOTE — Progress Notes (Signed)
Subjective:     Patient ID: [name], female   DOB: 04-Jun-1945, 70 y.o.   MRN: [id number]  HPI  TEST  PSG 10/07 >> AHI 49/h corrected by CPAP 15 cm (wt 200 lbs) She has gained 75 lbs since  10/13 Events were corrected by 15 cm to absence of events and 17 cm to absence of snoring. CPAP titration was optimal with a medium full- face mask.  Home study showed moderate OSA  Download on 15 cm 01/2012 >> good control of events - cpap working well, leak ++, needs better seal on mask  09/15/2012 Download 2/14 - on 15 cm ,No residuals, good compliance   11/19/2014 Follow up : Mod to Severe OSA on CPAP  Pt returns for 1 year follow up for sleep apnea.  She says she is doing well on CPAP .  Feels rest during daytime Wears CPAP  for around 6 hrs each night.  No mask or supply issues.  No recent download, requested from DME .  Denies chest pain, orthopnea , fever or increased edema.  OV 11/29/2015  Chief Complaint  Patient presents with  . Follow-up    Pt states shes here for annual appt for CPAP. Pt states she is using her CPAP nightly and denies issues with the mask, pressure or machine. Pt requests supplies for a year.    morbid obese female with moderate toto severe sleep apnea  On polysomnogram testing. She last saw her primary sleep Specialist Dr. Vassie Loll one year ago in October 2016. She is here for one-year follow-up. But got assigned to see mebecause she needs CPAP supplies for Medicare author In the last 1 year she says she's lost weight up to 15 pounds weight watcher program. She is also dealing with atrial fibrillation. Her dyspnea is better after weight loss. She says she is compliant with CPAP. There are no new issues. She is up-to-date with her flu and pneumonia vaccines.    has a past medical history of Arthritis; CAD (coronary artery disease); Chronic diastolic CHF (congestive heart failure) (HCC); Difficult intubation; Duodenitis; Headache(784.0); History of positive PPD;  Hyperlipidemia; Hypertension; Morbid obesity (HCC); Osteoporosis; PAF (paroxysmal atrial fibrillation) (HCC); and Renal insufficiency.   reports that she has never smoked. She has never used smokeless tobacco.  Past Surgical History:  Procedure Laterality Date  . ABDOMINAL HYSTERECTOMY     partial, bleeding  . CHOLECYSTECTOMY    . CORONARY ANGIOPLASTY WITH STENT PLACEMENT  2010   2.75- x 20-mm VeriFlex DES to the pLAD  . CORONARY STENT PLACEMENT    . ESOPHAGOGASTRODUODENOSCOPY  2003  . KNEE ARTHROSCOPY  2000 & 2001   left and right  . LEFT HEART CATHETERIZATION WITH CORONARY ANGIOGRAM N/A 09/24/2013   Procedure: LEFT HEART CATHETERIZATION WITH CORONARY ANGIOGRAM;  Surgeon: Micheline Chapman, MD;  [facility] OK, LAD stent patent, mLAD 50-60%, CFX OK, RCA 30-40%, EF 70%  . TOTAL KNEE ARTHROPLASTY     bilateral  . TUBAL LIGATION      Allergies  Allergen Reactions  . Statins Other (See Comments)    Muscle and joint pain    Immunization History  Administered Date(s) Administered  . Influenza Split 10/22/2011  . Influenza Whole 02/05/2005, 12/04/2007, 11/06/2010  . Influenza, High Dose Seasonal PF 11/01/2014  . Influenza,inj,Quad PF,36+ Mos 10/20/2013, 11/06/2015  . Influenza-Unspecified 11/05/2012  . Pneumococcal Conjugate-13 11/02/2015  . Pneumococcal Polysaccharide-23 09/24/2013  . Td 02/05/2005    Family History  Problem Relation Age of Onset  .  Other Mother     GI Bleed  . Pneumonia Father   . Coronary artery disease Brother   . Breast cancer Paternal Grandmother   . Rectal cancer Paternal Grandfather      Current Outpatient Prescriptions:  .  acetaminophen (TYLENOL) 500 MG tablet, Take 1,000 mg by mouth every 6 (six) hours as needed for headache., Disp: , Rfl:  .  amiodarone (PACERONE) 200 MG tablet, Take 1 tablet (200 mg total) by mouth daily., Disp: 90 tablet, Rfl: 3 .  furosemide (LASIX) 40 MG tablet, Take 1 tablet (40 mg total) by mouth daily., Disp: 30 tablet, Rfl:  3 .  hydrOXYzine (ATARAX/VISTARIL) 10 MG tablet, Take 1 tablet (10 mg total) by mouth 3 (three) times daily as needed for itching., Disp: 30 tablet, Rfl: 0 .  magnesium oxide (MAG-OX) 400 MG tablet, Take 1 tablet (400 mg total) by mouth daily., Disp: 90 tablet, Rfl: 3 .  metoprolol (LOPRESSOR) 50 MG tablet, Take 1 tablet (50 mg total) by mouth 2 (two) times daily., Disp: 60 tablet, Rfl: 3 .  potassium chloride (K-DUR,KLOR-CON) 10 MEQ tablet, Take 1 tablet (10 mEq total) by mouth 2 (two) times daily. (Patient taking differently: Take 10 mEq by mouth once a week. ), Disp: 180 tablet, Rfl: 2 .  terconazole (TERAZOL 7) 0.4 % vaginal cream, Place 1 applicator vaginally at bedtime., Disp: 45 g, Rfl: 0 .  warfarin (COUMADIN) 2.5 MG tablet, Take 1 tablet (2.5 mg total) by mouth daily at 6 PM. Or as directed by Coumad[facility] Clinic, Disp: 90 tablet, Rfl: 1   Review of Systems     Objective:   Physical Exam  Constitutional: She is oriented to person, place, and time. She appears well-developed and well-nourished. No distress.  obese  HENT:  Head: Normocephalic and atraumatic.  Right Ear: External ear normal.  Left Ear: External ear normal.  Mouth/Throat: Oropharynx is clear and moist. No oropharyngeal exudate.  Mallampati class III  Eyes: Conjunctivae and EOM are normal. Pupils are equal, round, and reactive to light. Right eye exhibits no discharge. Left eye exhibits no discharge. No scleral icterus.  Neck: Normal range of motion. Neck supple. No JVD present. No tracheal deviation present. No thyromegaly present.  Cardiovascular: Normal rate, regular rhythm, normal heart sounds and intact distal pulses.  Exam reveals no gallop and no friction rub.   No murmur heard. Pulmonary/Chest: Effort normal and breath sounds normal. No respiratory distress. She has no wheezes. She has no rales. She exhibits no tenderness.  Abdominal: Soft. Bowel sounds are normal. She exhibits no distension and no mass. There is  no tenderness. There is no rebound and no guarding.  Musculoskeletal: Normal range of motion. She exhibits no edema or tenderness.  Lymphadenopathy:    She has no cervical adenopathy.  Neurological: She is alert and oriented to person, place, and time. She has normal reflexes. No cranial nerve deficit. She exhibits normal muscle tone. Coordination normal.  Skin: Skin is warm and dry. No rash noted. She is not diaphoretic. No erythema. No pallor.  Psychiatric: She has a normal mood and affect. Her behavior is normal. Judgment and thought content normal.  Vitals reviewed.   Vitals:   11/29/15 1036  BP: 138/84  Pulse: 62  SpO2: 97%  Weight: 266 lb 12.8 oz (121 kg)  Height: 5\' 4"  (1.626 m)   Estimated body mass index is 45.8 kg/m as calculated from the following:   Height as of this encounter: 5\' 4"  (1.626 m).  Weight as of this encounter: 266 lb 12.8 oz (121 kg).      Assessment:       ICD-9-CM ICD-10-CM   1. Obstructive sleep apnea syndrome 327.23 G47.33   2. Morbid obesity, unspecified obesity type (HCC) 278.01 E66.01        Plan:      OSA stable Congratulations on weight loss - keep it going through [employer]. This is awesome  Plan  - refill cPAP suppplies  Followup  - 1 year with Dr Vassie Loll  (> 50% of this 15 min visit spent in face to face counseling or/and coordination of care)   Dr. Kalman Shan, M.D., [facility]C.P Pulmonary and Critical Care Medicine Staff Physician [facility] Pager: [phone], If no answer or between  15:00h - 7:00h: call [phone]  11/29/2015 10:56 AM

## 2015-12-04 ENCOUNTER — Other Ambulatory Visit: Payer: Self-pay | Admitting: Cardiology

## 2015-12-04 DIAGNOSIS — Z7901 Long term (current) use of anticoagulants: Secondary | ICD-10-CM

## 2015-12-04 DIAGNOSIS — Z5181 Encounter for therapeutic drug level monitoring: Secondary | ICD-10-CM

## 2015-12-04 DIAGNOSIS — I4819 Other persistent atrial fibrillation: Secondary | ICD-10-CM

## 2015-12-04 DIAGNOSIS — I4891 Unspecified atrial fibrillation: Secondary | ICD-10-CM

## 2015-12-06 ENCOUNTER — Other Ambulatory Visit (HOSPITAL_COMMUNITY): Payer: Self-pay | Admitting: Nurse Practitioner

## 2015-12-16 ENCOUNTER — Ambulatory Visit (INDEPENDENT_AMBULATORY_CARE_PROVIDER_SITE_OTHER): Payer: Medicare Other | Admitting: *Deleted

## 2015-12-16 DIAGNOSIS — I481 Persistent atrial fibrillation: Secondary | ICD-10-CM

## 2015-12-16 DIAGNOSIS — I4819 Other persistent atrial fibrillation: Secondary | ICD-10-CM

## 2015-12-16 DIAGNOSIS — Z7901 Long term (current) use of anticoagulants: Secondary | ICD-10-CM

## 2015-12-16 DIAGNOSIS — I2510 Atherosclerotic heart disease of native coronary artery without angina pectoris: Secondary | ICD-10-CM

## 2015-12-16 DIAGNOSIS — Z5181 Encounter for therapeutic drug level monitoring: Secondary | ICD-10-CM | POA: Diagnosis not present

## 2015-12-16 DIAGNOSIS — I4891 Unspecified atrial fibrillation: Secondary | ICD-10-CM

## 2015-12-16 LAB — POCT INR: INR: 2.6

## 2016-01-27 ENCOUNTER — Ambulatory Visit (INDEPENDENT_AMBULATORY_CARE_PROVIDER_SITE_OTHER): Payer: Medicare Other | Admitting: *Deleted

## 2016-01-27 DIAGNOSIS — I2510 Atherosclerotic heart disease of native coronary artery without angina pectoris: Secondary | ICD-10-CM

## 2016-01-27 DIAGNOSIS — I4891 Unspecified atrial fibrillation: Secondary | ICD-10-CM | POA: Diagnosis not present

## 2016-01-27 DIAGNOSIS — I481 Persistent atrial fibrillation: Secondary | ICD-10-CM

## 2016-01-27 DIAGNOSIS — Z5181 Encounter for therapeutic drug level monitoring: Secondary | ICD-10-CM | POA: Diagnosis not present

## 2016-01-27 DIAGNOSIS — Z7901 Long term (current) use of anticoagulants: Secondary | ICD-10-CM | POA: Diagnosis not present

## 2016-01-27 DIAGNOSIS — I4819 Other persistent atrial fibrillation: Secondary | ICD-10-CM

## 2016-01-27 LAB — POCT INR: INR: 2.4

## 2016-03-07 ENCOUNTER — Other Ambulatory Visit: Payer: Self-pay | Admitting: Cardiology

## 2016-03-07 DIAGNOSIS — I4891 Unspecified atrial fibrillation: Secondary | ICD-10-CM

## 2016-03-07 DIAGNOSIS — Z5181 Encounter for therapeutic drug level monitoring: Secondary | ICD-10-CM

## 2016-03-07 DIAGNOSIS — Z7901 Long term (current) use of anticoagulants: Secondary | ICD-10-CM

## 2016-03-07 DIAGNOSIS — I4819 Other persistent atrial fibrillation: Secondary | ICD-10-CM

## 2016-03-09 ENCOUNTER — Ambulatory Visit (INDEPENDENT_AMBULATORY_CARE_PROVIDER_SITE_OTHER): Payer: Medicare Other | Admitting: *Deleted

## 2016-03-09 DIAGNOSIS — I481 Persistent atrial fibrillation: Secondary | ICD-10-CM | POA: Diagnosis not present

## 2016-03-09 DIAGNOSIS — Z7901 Long term (current) use of anticoagulants: Secondary | ICD-10-CM

## 2016-03-09 DIAGNOSIS — Z5181 Encounter for therapeutic drug level monitoring: Secondary | ICD-10-CM | POA: Diagnosis not present

## 2016-03-09 DIAGNOSIS — I4819 Other persistent atrial fibrillation: Secondary | ICD-10-CM

## 2016-03-09 DIAGNOSIS — I4891 Unspecified atrial fibrillation: Secondary | ICD-10-CM | POA: Diagnosis not present

## 2016-03-09 LAB — POCT INR: INR: 2

## 2016-04-02 ENCOUNTER — Other Ambulatory Visit (HOSPITAL_COMMUNITY): Payer: Self-pay | Admitting: Nurse Practitioner

## 2016-04-09 ENCOUNTER — Encounter (HOSPITAL_COMMUNITY): Payer: Self-pay | Admitting: Nurse Practitioner

## 2016-04-09 ENCOUNTER — Ambulatory Visit (HOSPITAL_COMMUNITY)
Admission: RE | Admit: 2016-04-09 | Discharge: 2016-04-09 | Disposition: A | Discharge status: Home / Self Care | Payer: Medicare Other | Source: Ambulatory Visit | Attending: Nurse Practitioner | Admitting: Nurse Practitioner

## 2016-04-09 ENCOUNTER — Telehealth: Payer: Self-pay | Admitting: Pharmacist

## 2016-04-09 VITALS — BP 134/82 | HR 71 | Ht 64.0 in | Wt 272.2 lb

## 2016-04-09 DIAGNOSIS — Z9889 Other specified postprocedural states: Secondary | ICD-10-CM | POA: Insufficient documentation

## 2016-04-09 DIAGNOSIS — I4819 Other persistent atrial fibrillation: Secondary | ICD-10-CM

## 2016-04-09 DIAGNOSIS — Z90710 Acquired absence of both cervix and uterus: Secondary | ICD-10-CM | POA: Insufficient documentation

## 2016-04-09 DIAGNOSIS — R9431 Abnormal electrocardiogram [ECG] [EKG]: Secondary | ICD-10-CM | POA: Diagnosis not present

## 2016-04-09 DIAGNOSIS — I482 Chronic atrial fibrillation, unspecified: Secondary | ICD-10-CM

## 2016-04-09 DIAGNOSIS — E669 Obesity, unspecified: Secondary | ICD-10-CM | POA: Insufficient documentation

## 2016-04-09 DIAGNOSIS — Z9049 Acquired absence of other specified parts of digestive tract: Secondary | ICD-10-CM | POA: Diagnosis not present

## 2016-04-09 DIAGNOSIS — G4730 Sleep apnea, unspecified: Secondary | ICD-10-CM | POA: Insufficient documentation

## 2016-04-09 DIAGNOSIS — I2510 Atherosclerotic heart disease of native coronary artery without angina pectoris: Secondary | ICD-10-CM | POA: Diagnosis not present

## 2016-04-09 DIAGNOSIS — Z79899 Other long term (current) drug therapy: Secondary | ICD-10-CM | POA: Diagnosis not present

## 2016-04-09 DIAGNOSIS — Z7901 Long term (current) use of anticoagulants: Secondary | ICD-10-CM | POA: Insufficient documentation

## 2016-04-09 DIAGNOSIS — Z5181 Encounter for therapeutic drug level monitoring: Secondary | ICD-10-CM

## 2016-04-09 DIAGNOSIS — Z9851 Tubal ligation status: Secondary | ICD-10-CM | POA: Diagnosis not present

## 2016-04-09 LAB — COMPREHENSIVE METABOLIC PANEL
ALBUMIN: 3.7 g/dL (ref 3.5–5.0)
ALT: 25 U/L (ref 14–54)
AST: 35 U/L (ref 15–41)
Alkaline Phosphatase: 75 U/L (ref 38–126)
Anion gap: 4 — ABNORMAL LOW (ref 5–15)
BUN: 18 mg/dL (ref 6–20)
CO2: 30 mmol/L (ref 22–32)
Calcium: 9.9 mg/dL (ref 8.9–10.3)
Chloride: 104 mmol/L (ref 101–111)
Creatinine, Ser: 1.37 mg/dL — ABNORMAL HIGH (ref 0.44–1.00)
GFR calc Af Amer: 43 mL/min — ABNORMAL LOW (ref >60)
GFR calc non Af Amer: 38 mL/min — ABNORMAL LOW (ref >60)
GLUCOSE: 107 mg/dL — AB (ref 65–99)
POTASSIUM: 4.2 mmol/L (ref 3.5–5.1)
SODIUM: 138 mmol/L (ref 135–145)
Total Bilirubin: 0.5 mg/dL (ref 0.3–1.2)
Total Protein: 7.3 g/dL (ref 6.5–8.1)

## 2016-04-09 LAB — TSH: TSH: 4.202 u[IU]/mL (ref 0.350–4.500)

## 2016-04-09 MED ORDER — FUROSEMIDE 40 MG PO TABS: 40.0000 mg | ORAL_TABLET | Freq: Every day | ORAL | 2 refills | Status: DC

## 2016-04-09 MED ORDER — MAGNESIUM OXIDE 400 MG PO TABS: 400.0000 mg | ORAL_TABLET | Freq: Every day | ORAL | 2 refills | Status: DC

## 2016-04-09 MED ORDER — METOPROLOL TARTRATE 50 MG PO TABS: 50.0000 mg | ORAL_TABLET | Freq: Two times a day (BID) | ORAL | 3 refills | Status: DC

## 2016-04-09 MED ORDER — WARFARIN SODIUM 2.5 MG PO TABS: ORAL_TABLET | ORAL | 1 refills | Status: DC

## 2016-04-09 MED ORDER — AMIODARONE HCL 200 MG PO TABS: 100.0000 mg | ORAL_TABLET | Freq: Every day | ORAL | 3 refills | Status: DC

## 2016-04-09 NOTE — Progress Notes (Signed)
Patient ID: [name], female   DOB: May 19, 1945, 71 y.o.   MRN: [id number]     Primary Care Physician: Ruthe Mannan, MD Referring Physician: Dr. Hetty Ely is a 71 y.o. female with a h/o of CAD s/p PCI 2010, PAF, chronic diastolic CHF, HTN, HL, and apical hypertrophy. She is here today for  follow-up of in the afib clinic.    She has been placed on amiodarone for rate control by Dr. Johney Frame in 2016. She has failed amiodarone/tikosyn in the past. She has done "much better" and is not aware of episodes of afib. She is in afib, but rate controlled and does not really feel irregular heart beat.   She has chronic dyspnea with exertion,   probably mutifactorial. This was no better with sinus rhythm. She has sleep apnea, wears cpap regularly. She saw  Dr. Sharee Pimple for borderline abnormal PFT's and he thought no issue with lungs or amiodarone, obesity was contributing to her breathing issues.She is limited in her exercise ability due to bilateral knee replacement. She is  c/o chronic fatigue.  F/u 04/09/16,  She continues in rate controlled afib. Will try to reduce amiodarone to 100 mg a day to offset any side effects, and  see if still can keep good rate control. She is using cpap on a regular basis. Chronic c/o of exertional dyspnea and fatigue are unchanged.  Today, she denies symptoms of palpitations, chest pain,  orthopnea, PND, lower extremity edema, dizziness, presyncope, syncope, or neurologic sequela.  Positive for chronic shortness of breath/fatigue.The patient is tolerating medications without difficulties and is otherwise without complaint today.   Past Medical History:  Diagnosis Date  . Arthritis   . CAD (coronary artery disease)    a. 07/2008 NSTEMI ->Cath: LM nl, LAD 70p/90p, 20m, LCX nl, RCA nl, EF 60%.  The LAD was stented with a 2.25x33mm Veri-Flex BMS.  b. 8/15 cath patent stent, nonobstructive disease otherwise - unchanged. Elevated troponin felt due to demand ischemia in  setting of AF-RVR. c. Elevated trop 06/2014 felt due to demand ischemia.  . Chronic diastolic CHF (congestive heart failure) (HCC)    a. 07/2008 Echo: EF 60-65%, Triv AI/MR, Mild TR;  b. 09/2013 Echo: EF 60-65%, mild conc LVH.  [name] Difficult intubation   . Duodenitis   . Headache(784.0)   . History of positive PPD   . Hyperlipidemia   . Hypertension   . Morbid obesity (HCC)   . Osteoporosis   . PAF (paroxysmal atrial fibrillation) (HCC)    a. s/p multiple cardioversions;  b. 2010: on Tikosyn - dose decreased from to in setting of NSTEMI/QT prolongation. Did not hold NSR. c. admitted 08/2011 with loading at BID with DCCV at that time;  d. Chronic coumadin (CHA2DS2VASc = 5). e. 06/2014: Joice Lofts stopped due to ineffective; started on amiodarone.  . Renal insufficiency    a. Baseline Cr appears 1.1-1.2.   Past Surgical History:  Procedure Laterality Date  . ABDOMINAL HYSTERECTOMY     partial, bleeding  . CHOLECYSTECTOMY    . CORONARY ANGIOPLASTY WITH STENT PLACEMENT  2010   2.75- x 20-mm VeriFlex DES to the pLAD  . CORONARY STENT PLACEMENT    . ESOPHAGOGASTRODUODENOSCOPY  2003  . KNEE ARTHROSCOPY  2000 & 2001   left and right  . LEFT HEART CATHETERIZATION WITH CORONARY ANGIOGRAM N/A 09/24/2013   Procedure: LEFT HEART CATHETERIZATION WITH CORONARY ANGIOGRAM;  Surgeon: Micheline Chapman, MD;  [name]atten,  LAD stent patent, mLAD 50-60%, CFX OK, RCA 30-40%, EF 70%  . TOTAL KNEE ARTHROPLASTY     bilateral  . TUBAL LIGATION      Current Outpatient Prescriptions  Medication Sig Dispense Refill  . acetaminophen (TYLENOL) 500 MG tablet Take 1,000 mg by mouth every 6 (six) hours as needed for headache.    [name] amiodarone (PACERONE) 200 MG tablet Take 0.5 tablets (100 mg total) by mouth daily. 90 tablet 3  . furosemide (LASIX) 40 MG tablet Take 1 tablet (40 mg total) by mouth daily. 90 tablet 2  . magnesium oxide (MAG-OX) 400 MG tablet Take 1 tablet (400 mg total) by mouth daily. 90  tablet 2  . metoprolol (LOPRESSOR) 50 MG tablet Take 1 tablet (50 mg total) by mouth 2 (two) times daily. 180 tablet 3  . warfarin (COUMADIN) 2.5 MG tablet Take 1/2 to 1 tablet by mouth daily as directed by coumadin clinic 90 tablet 1  . hydrOXYzine (ATARAX/VISTARIL) 10 MG tablet Take 1 tablet (10 mg total) by mouth 3 (three) times daily as needed for itching. (Patient not taking: Reported on 04/09/2016) 30 tablet 0   No current facility-administered medications for this encounter.     Allergies  Allergen Reactions  . Statins Other (See Comments)    Muscle and joint pain    Social History   Social History  . Marital status: Widowed    Spouse name: N/A  . Number of children: 2  . Years of education: N/A   Occupational History  . retired Retired   Social History Main Topics  . Smoking status: Never Smoker  . Smokeless tobacco: Never Used  . Alcohol use No  . Drug use: No  . Sexual activity: Not Currently    Birth control/ protection: Post-menopausal   Other Topics Concern  . Not on file   Social History Narrative   Lives in [place], [place] w/ husband.    Family History  Problem Relation Age of Onset  . Other Mother     GI Bleed  . Pneumonia Father   . Coronary artery disease Brother   . Breast cancer Paternal Grandmother   . Rectal cancer Paternal Grandfather     ROS- All systems are reviewed and negative except as per the HPI above  Physical Exam: Vitals:   04/09/16 0957  BP: 134/82  Pulse: 71  Weight: 272 lb 3.2 oz (123.5 kg)  Height: 5\' 4"  (1.626 m)    GEN- The patient is well appearing, alert and oriented x 3 today.   Head- normocephalic, atraumatic Eyes-  Sclera clear, conjunctiva pink Ears- hearing intact Oropharynx- clear Neck- supple, no JVP Lymph- no cervical lymphadenopathy Lungs- Clear to ausculation bilaterally, normal work of breathing Heart- Irregular rate and rhythm, no murmurs, rubs or gallops, PMI not laterally displaced GI- soft, NT, ND,  + BS Extremities- no clubbing, cyanosis, or edema MS- no significant deformity or atrophy Skin- no rash or lesion Psych- euthymic mood, full affect Neuro- strength and sensation are intact  EKG-Afib at 71 bpm, ST/T wave abnormalities, unchanged from previous. QRS int 90 ms, Qtc 443 ms Epic records reviewed Echo 11/02/16-Study Conclusions  - Left ventricle: The cavity size was normal. There was mild focal   basal hypertrophy of the septum. Systolic function was normal.   The estimated ejection fraction was in the range of 50% to 55%.   There is hypokinesis of the apical myocardium. Left ventricular   diastolic function parameters were normal. - Mitral  valve: Calcified annulus. - Left atrium: The atrium was mildly dilated. Volume/bsa, ES,   (1-plane Simpson&'s, A2C): 39 ml/m^2. - Right atrium: The atrium was mildly dilated.  Impressions:  - Compared to the prior study, there has been no significant   interval change.   Assessment and Plan: 1. Chronic afib Rate controlled with amiodarone Will try to reduce amiodarone to  100 mg  to offset side effects, if can keep good rate control Labs today, tsh, cmet Continue metoprolol  50 mg bid    2. Sleep apnea Wearing cpap F/u's with pulmonology  As scheduled  3. Obesity Limited in exercise ability due to knee issues   Return in 3 weeks for  Rate control on less amioderone  [facility] [address] [place], [place] [address] [phone]

## 2016-04-09 NOTE — Telephone Encounter (Signed)
-----   Message from [name], CMA sent at 04/09/2016 10:22 AM EST ----- This patient is requesting a refill of her warfarin for a 90 day supply to be sent to her pharm.  Thank you

## 2016-04-09 NOTE — Progress Notes (Signed)
Decrease Amiodarone to 100 mg daily. We will see you back in 3 weeks for an EKG.    Refills will be sent into your pharmacy.

## 2016-04-20 ENCOUNTER — Ambulatory Visit (INDEPENDENT_AMBULATORY_CARE_PROVIDER_SITE_OTHER): Payer: Medicare Other | Admitting: *Deleted

## 2016-04-20 DIAGNOSIS — I481 Persistent atrial fibrillation: Secondary | ICD-10-CM

## 2016-04-20 DIAGNOSIS — Z7901 Long term (current) use of anticoagulants: Secondary | ICD-10-CM

## 2016-04-20 DIAGNOSIS — I4891 Unspecified atrial fibrillation: Secondary | ICD-10-CM | POA: Diagnosis not present

## 2016-04-20 DIAGNOSIS — I4819 Other persistent atrial fibrillation: Secondary | ICD-10-CM

## 2016-04-20 DIAGNOSIS — Z5181 Encounter for therapeutic drug level monitoring: Secondary | ICD-10-CM | POA: Diagnosis not present

## 2016-04-20 LAB — POCT INR: INR: 2.7

## 2016-04-30 ENCOUNTER — Encounter (HOSPITAL_COMMUNITY): Payer: Medicare Other | Admitting: Nurse Practitioner

## 2016-05-08 ENCOUNTER — Ambulatory Visit (HOSPITAL_COMMUNITY)
Admission: RE | Admit: 2016-05-08 | Discharge: 2016-05-08 | Disposition: A | Discharge status: Home / Self Care | Payer: Medicare Other | Source: Ambulatory Visit | Attending: Nurse Practitioner | Admitting: Nurse Practitioner

## 2016-05-08 ENCOUNTER — Encounter (HOSPITAL_COMMUNITY): Payer: Self-pay | Admitting: Nurse Practitioner

## 2016-05-08 DIAGNOSIS — I482 Chronic atrial fibrillation: Secondary | ICD-10-CM | POA: Insufficient documentation

## 2016-05-08 DIAGNOSIS — I4891 Unspecified atrial fibrillation: Secondary | ICD-10-CM | POA: Diagnosis present

## 2016-05-08 NOTE — Progress Notes (Addendum)
Pt in for EKG today after decreasing the amiodarone to 100 mg.   Pt HR today at 54. Pt BP 132/68.  EKG to be reviewed by Rudi Coco, NP   Pt comes for EKG after amiodarone, on for rate control, decreased to 100 mg a day to mitigate side effects of drug. She remains rate controlled in the 50's. At one time she was very hard to rate control and Dr.Allred had started for that purpose. Return in 6 months and if still has tendency to run on the slow side will d/c amiodarone. She is in permanent afib.

## 2016-05-24 DIAGNOSIS — H26491 Other secondary cataract, right eye: Secondary | ICD-10-CM | POA: Diagnosis not present

## 2016-05-24 DIAGNOSIS — H524 Presbyopia: Secondary | ICD-10-CM | POA: Diagnosis not present

## 2016-05-24 DIAGNOSIS — H2589 Other age-related cataract: Secondary | ICD-10-CM | POA: Diagnosis not present

## 2016-05-24 DIAGNOSIS — H40011 Open angle with borderline findings, low risk, right eye: Secondary | ICD-10-CM | POA: Diagnosis not present

## 2016-05-24 DIAGNOSIS — H04123 Dry eye syndrome of bilateral lacrimal glands: Secondary | ICD-10-CM | POA: Diagnosis not present

## 2016-05-31 ENCOUNTER — Ambulatory Visit (INDEPENDENT_AMBULATORY_CARE_PROVIDER_SITE_OTHER): Payer: Medicare Other | Admitting: *Deleted

## 2016-05-31 DIAGNOSIS — I481 Persistent atrial fibrillation: Secondary | ICD-10-CM | POA: Diagnosis not present

## 2016-05-31 DIAGNOSIS — I4819 Other persistent atrial fibrillation: Secondary | ICD-10-CM

## 2016-05-31 DIAGNOSIS — Z5181 Encounter for therapeutic drug level monitoring: Secondary | ICD-10-CM

## 2016-05-31 DIAGNOSIS — I4891 Unspecified atrial fibrillation: Secondary | ICD-10-CM

## 2016-05-31 DIAGNOSIS — Z7901 Long term (current) use of anticoagulants: Secondary | ICD-10-CM

## 2016-05-31 LAB — POCT INR: INR: 2.4

## 2016-06-18 IMAGING — DX DG CHEST 2V
2 series · 2 of 2 positions shown · non-contrast
Comparison: 06/25/2014 and earlier.

CLINICAL DATA: 69-year-old female with shortness of breath for 6
months. Initial encounter.

EXAM:
CHEST  2 VIEW

[chest pa]
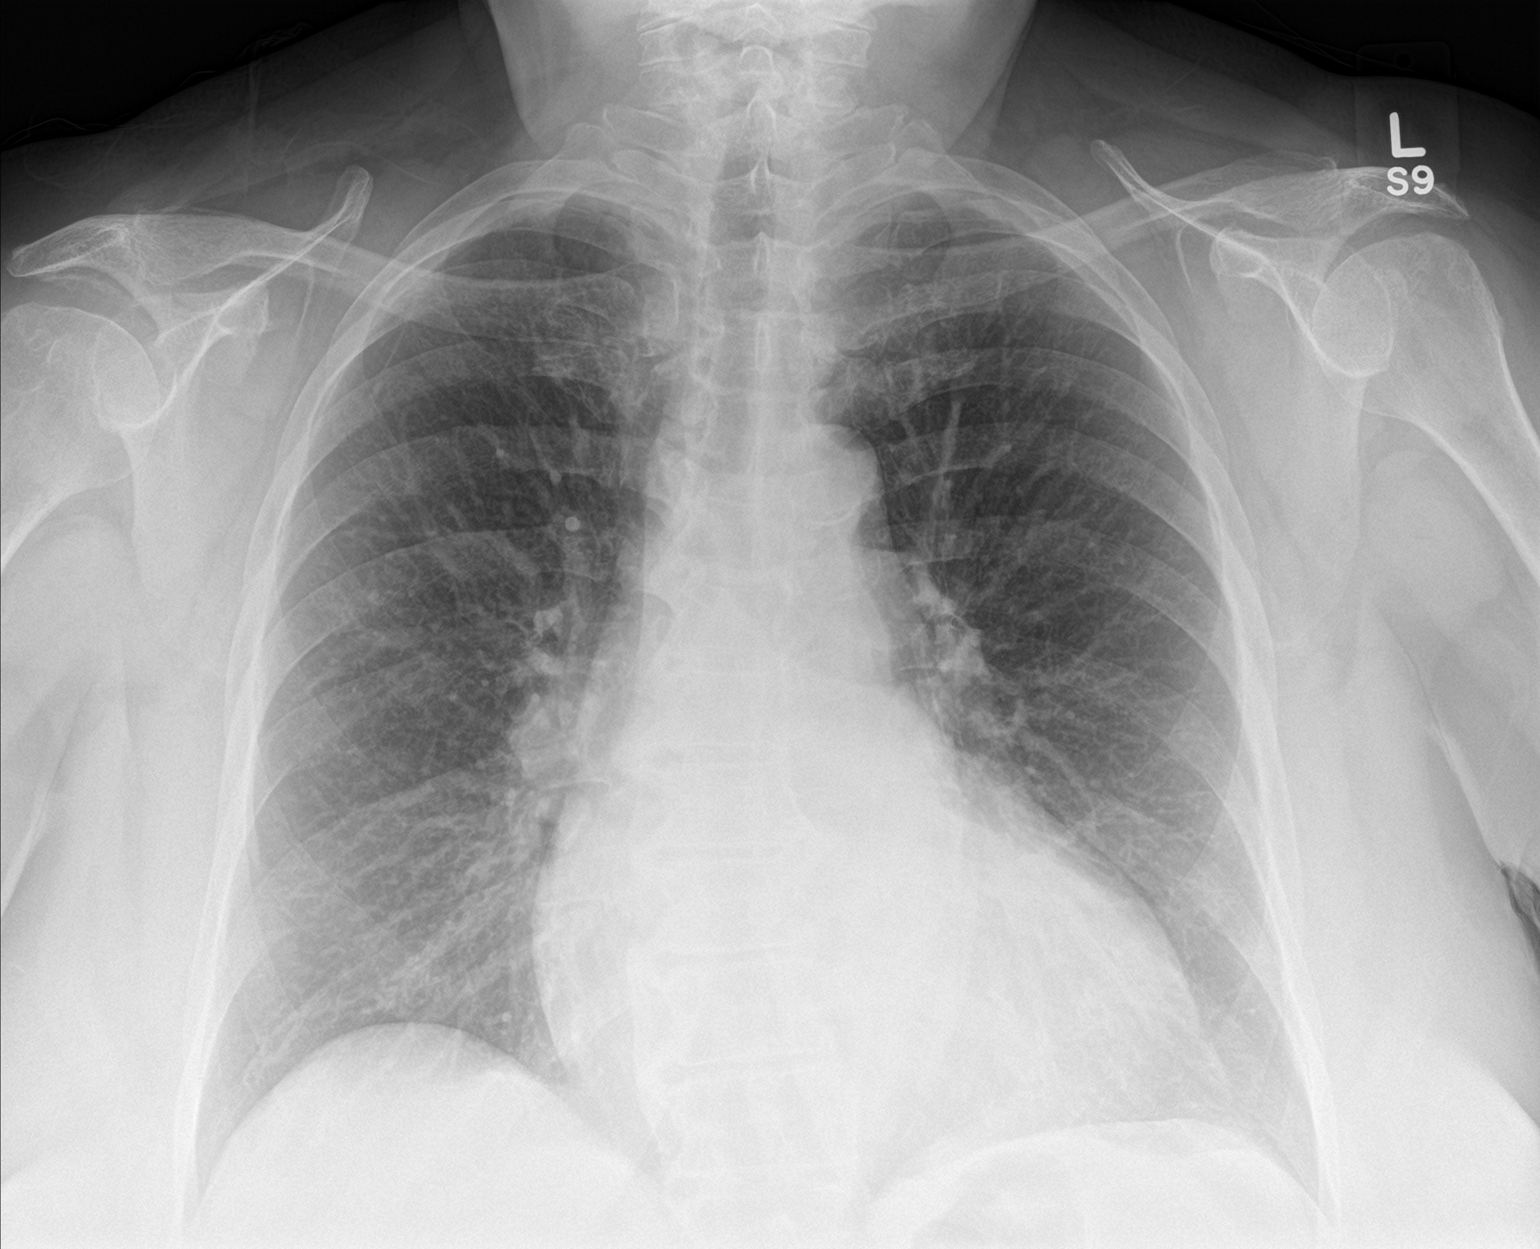

[chest lat]
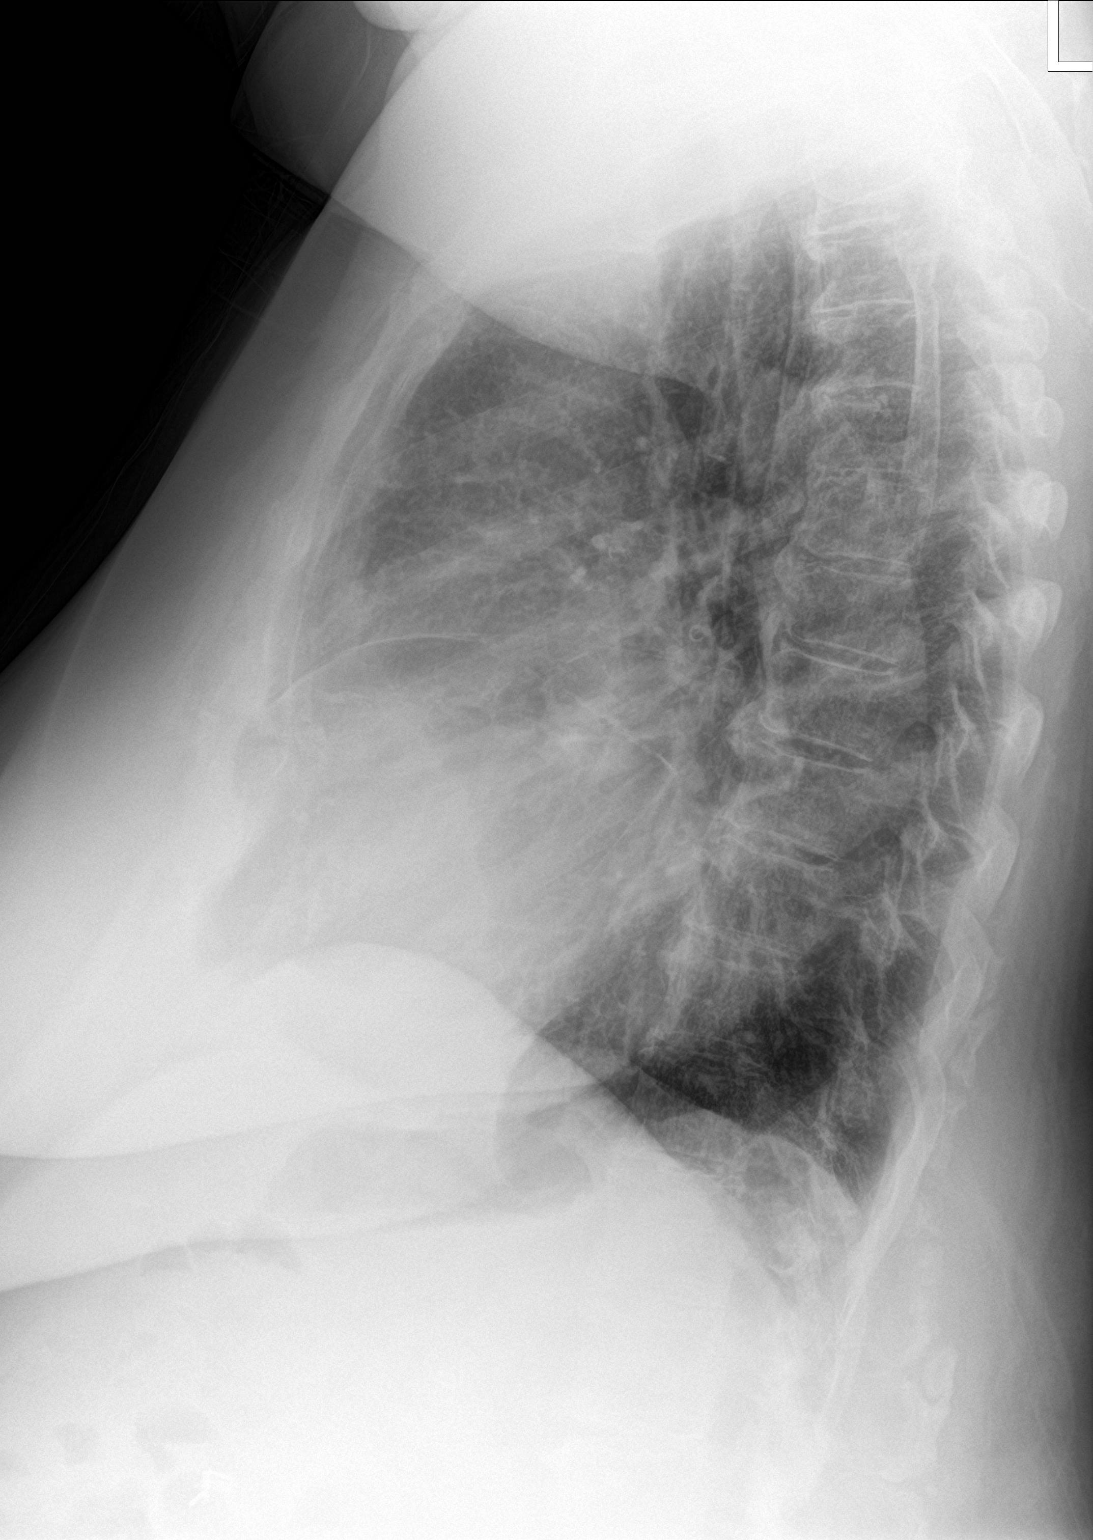

[2 of 2 positions shown; findings below may reference images not displayed]

FINDINGS: Chronic cardiomegaly is stable. Mild tortuosity of the descending
thoracic aorta is stable. Calcified aortic atherosclerosis. Other
mediastinal contours are within normal limits. Visualized tracheal
air column is within normal limits. Stable lung volumes. No
pneumothorax, pulmonary edema, pleural effusion or acute pulmonary
opacity. No acute osseous abnormality identified.
IMPRESSION: Stable cardiomegaly. No acute cardiopulmonary abnormality.

## 2016-07-12 ENCOUNTER — Ambulatory Visit (INDEPENDENT_AMBULATORY_CARE_PROVIDER_SITE_OTHER): Payer: Medicare Other | Admitting: *Deleted

## 2016-07-12 ENCOUNTER — Encounter (INDEPENDENT_AMBULATORY_CARE_PROVIDER_SITE_OTHER): Payer: Self-pay

## 2016-07-12 DIAGNOSIS — I481 Persistent atrial fibrillation: Secondary | ICD-10-CM

## 2016-07-12 DIAGNOSIS — Z7901 Long term (current) use of anticoagulants: Secondary | ICD-10-CM | POA: Diagnosis not present

## 2016-07-12 DIAGNOSIS — I4891 Unspecified atrial fibrillation: Secondary | ICD-10-CM

## 2016-07-12 DIAGNOSIS — Z5181 Encounter for therapeutic drug level monitoring: Secondary | ICD-10-CM | POA: Diagnosis not present

## 2016-07-12 DIAGNOSIS — I4819 Other persistent atrial fibrillation: Secondary | ICD-10-CM

## 2016-07-12 LAB — POCT INR: INR: 1.9

## 2016-07-27 ENCOUNTER — Ambulatory Visit (INDEPENDENT_AMBULATORY_CARE_PROVIDER_SITE_OTHER): Payer: Medicare Other | Admitting: *Deleted

## 2016-07-27 DIAGNOSIS — I481 Persistent atrial fibrillation: Secondary | ICD-10-CM

## 2016-07-27 DIAGNOSIS — I4891 Unspecified atrial fibrillation: Secondary | ICD-10-CM | POA: Diagnosis not present

## 2016-07-27 DIAGNOSIS — Z7901 Long term (current) use of anticoagulants: Secondary | ICD-10-CM | POA: Diagnosis not present

## 2016-07-27 DIAGNOSIS — Z5181 Encounter for therapeutic drug level monitoring: Secondary | ICD-10-CM | POA: Diagnosis not present

## 2016-07-27 DIAGNOSIS — I4819 Other persistent atrial fibrillation: Secondary | ICD-10-CM

## 2016-07-27 LAB — POCT INR: INR: 2

## 2016-08-14 ENCOUNTER — Ambulatory Visit (INDEPENDENT_AMBULATORY_CARE_PROVIDER_SITE_OTHER): Payer: Medicare Other | Admitting: Primary Care

## 2016-08-14 ENCOUNTER — Encounter: Payer: Self-pay | Admitting: Primary Care

## 2016-08-14 VITALS — BP 126/78 | HR 88 | Temp 98.8°F | Ht 64.0 in | Wt 278.1 lb

## 2016-08-14 DIAGNOSIS — J069 Acute upper respiratory infection, unspecified: Secondary | ICD-10-CM | POA: Diagnosis not present

## 2016-08-14 DIAGNOSIS — B9789 Other viral agents as the cause of diseases classified elsewhere: Secondary | ICD-10-CM

## 2016-08-14 NOTE — Progress Notes (Signed)
Subjective:    Patient ID: [name], female    DOB: 12/13/1945, 71 y.o.   MRN: [id number]  HPI  [name] is a 71 year old female with a history of chronic CHF, GERD, hypertension who presents today with a chief complaint of cough. She also reports ear fullness, nasal congestion, shortness of breath, chest congestion. Her symptoms began two days ago. She denies fevers, sick contacts. She's taken Claritin without much improvement. Her cough is mildly productive with clear sputum.   Review of Systems  Constitutional: Negative for chills, fatigue and fever.  HENT: Positive for congestion. Negative for sinus pressure and sore throat.   Respiratory: Positive for cough. Negative for shortness of breath.   Cardiovascular: Negative for chest pain.       Past Medical History:  Diagnosis Date  . Arthritis   . CAD (coronary artery disease)    a. 07/2008 NSTEMI ->Cath: LM nl, LAD 70p/90p, 53m, LCX nl, RCA nl, EF 60%.  The LAD was stented with a 2.25x12mm Veri-Flex BMS.  b. 8/15 cath patent stent, nonobstructive disease otherwise - unchanged. Elevated troponin felt due to demand ischemia in setting of AF-RVR. c. Elevated trop 06/2014 felt due to demand ischemia.  . Chronic diastolic CHF (congestive heart failure) (HCC)    a. 07/2008 Echo: EF 60-65%, Triv AI/MR, Mild TR;  b. 09/2013 Echo: EF 60-65%, mild conc LVH.  [name] Difficult intubation   . Duodenitis   . Headache(784.0)   . History of positive PPD   . Hyperlipidemia   . Hypertension   . Morbid obesity (HCC)   . Osteoporosis   . PAF (paroxysmal atrial fibrillation) (HCC)    a. s/p multiple cardioversions;  b. 2010: on Tikosyn - dose decreased from to in setting of NSTEMI/QT prolongation. Did not hold NSR. c. admitted 08/2011 with loading at BID with DCCV at that time;  d. Chronic coumadin (CHA2DS2VASc = 5). e. 06/2014: Joice Lofts stopped due to ineffective; started on amiodarone.  . Renal insufficiency    a. Baseline Cr appears  1.1-1.2.     Social History   Social History  . Marital status: Widowed    Spouse name: N/A  . Number of children: 2  . Years of education: N/A   Occupational History  . retired Retired   Social History Main Topics  . Smoking status: Never Smoker  . Smokeless tobacco: Never Used  . Alcohol use No  . Drug use: No  . Sexual activity: Not Currently    Birth control/ protection: Post-menopausal   Other Topics Concern  . Not on file   Social History Narrative   Lives in [place], [place] w/ husband.    Past Surgical History:  Procedure Laterality Date  . ABDOMINAL HYSTERECTOMY     partial, bleeding  . CHOLECYSTECTOMY    . CORONARY ANGIOPLASTY WITH STENT PLACEMENT  2010   2.75- x 20-mm VeriFlex DES to the pLAD  . CORONARY STENT PLACEMENT    . ESOPHAGOGASTRODUODENOSCOPY  2003  . KNEE ARTHROSCOPY  2000 & 2001   left and right  . LEFT HEART CATHETERIZATION WITH CORONARY ANGIOGRAM N/A 09/24/2013   Procedure: LEFT HEART CATHETERIZATION WITH CORONARY ANGIOGRAM;  Surgeon: Micheline Chapman, MD;  [facility] OK, LAD stent patent, mLAD 50-60%, CFX OK, RCA 30-40%, EF 70%  . TOTAL KNEE ARTHROPLASTY     bilateral  . TUBAL LIGATION      Family History  Problem Relation Age of Onset  . Other  Mother        GI Bleed  . Pneumonia Father   . Coronary artery disease Brother   . Breast cancer Paternal Grandmother   . Rectal cancer Paternal Grandfather     Allergies  Allergen Reactions  . Statins Other (See Comments)    Muscle and joint pain    Current Outpatient Prescriptions on File Prior to Visit  Medication Sig Dispense Refill  . acetaminophen (TYLENOL) 500 MG tablet Take 1,000 mg by mouth every 6 (six) hours as needed for headache.    [name] amiodarone (PACERONE) 200 MG tablet Take 0.5 tablets (100 mg total) by mouth daily. 90 tablet 3  . furosemide (LASIX) 40 MG tablet Take 1 tablet (40 mg total) by mouth daily. 90 tablet 2  . magnesium oxide (MAG-OX) 400 MG tablet Take 1 tablet (400 mg  total) by mouth daily. 90 tablet 2  . metoprolol (LOPRESSOR) 50 MG tablet Take 1 tablet (50 mg total) by mouth 2 (two) times daily. 180 tablet 3  . warfarin (COUMADIN) 2.5 MG tablet Take 1/2 to 1 tablet by mouth daily as directed by coumadin clinic 90 tablet 1  . hydrOXYzine (ATARAX/VISTARIL) 10 MG tablet Take 1 tablet (10 mg total) by mouth 3 (three) times daily as needed for itching. (Patient not taking: Reported on 04/09/2016) 30 tablet 0   No current facility-administered medications on file prior to visit.     BP 126/78   Pulse 88   Temp 98.8 F (37.1 C) (Oral)   Ht 5\' 4"  (1.626 m)   Wt 278 lb 1.9 oz (126.2 kg)   SpO2 96%   BMI 47.74 kg/m    Objective:   Physical Exam  Constitutional: She appears well-nourished. She does not appear ill.  HENT:  Right Ear: Tympanic membrane and ear canal normal.  Left Ear: Tympanic membrane and ear canal normal.  Nose: Right sinus exhibits no maxillary sinus tenderness and no frontal sinus tenderness. Left sinus exhibits no maxillary sinus tenderness and no frontal sinus tenderness.  Mouth/Throat: Oropharynx is clear and moist.  Eyes: Conjunctivae are normal.  Neck: Neck supple.  Cardiovascular: Normal rate and regular rhythm.   Pulmonary/Chest: Effort normal and breath sounds normal. She has no wheezes. She has no rales.  Mild upper bilateral lobe congestion  Lymphadenopathy:    She has no cervical adenopathy.  Skin: Skin is warm and dry.          Assessment & Plan:  URI:  Cough, congestion x 2 days. Exam today overall unremarkable, mild congestion that clears with cough. Doesn't appear ill. Vitals normal. Suspect allergies or viral involvement. Will treat with conservative measures. Discussed use of Mucinex, Robitussin/Delsym.  She will call in 1 week if symptoms have not improved. Consider Amoxil course at that point.  Morrie Sheldon, NP

## 2016-08-14 NOTE — Patient Instructions (Signed)
Continue Claritin.  Start plain Mucinex for congestion.  You can also try plain Robitussin or Delsym as needed for cough.  Ensure you are staying hydrated with water and rest.  Please notify me if you develop persistent fevers of 101, start coughing up green mucous, notice increased fatigue or weakness, or feel worse after 1 week of onset of symptoms.   It was a pleasure meeting you!   Upper Respiratory Infection, Adult Most upper respiratory infections (URIs) are a viral infection of the air passages leading to the lungs. A URI affects the nose, throat, and upper air passages. The most common type of URI is nasopharyngitis and is typically referred to as "the common cold." URIs run their course and usually go away on their own. Most of the time, a URI does not require medical attention, but sometimes a bacterial infection in the upper airways can follow a viral infection. This is called a secondary infection. Sinus and middle ear infections are common types of secondary upper respiratory infections. Bacterial pneumonia can also complicate a URI. A URI can worsen asthma and chronic obstructive pulmonary disease (COPD). Sometimes, these complications can require emergency medical care and may be life threatening. What are the causes? Almost all URIs are caused by viruses. A virus is a type of germ and can spread from one person to another. What increases the risk? You may be at risk for a URI if:  You smoke.  You have chronic heart or lung disease.  You have a weakened defense (immune) system.  You are very young or very old.  You have nasal allergies or asthma.  You work in crowded or poorly ventilated areas.  You work in health care facilities or schools.  What are the signs or symptoms? Symptoms typically develop 2-3 days after you come in contact with a cold virus. Most viral URIs last 7-10 days. However, viral URIs from the influenza virus (flu virus) can last 14-18 days and  are typically more severe. Symptoms may include:  Runny or stuffy (congested) nose.  Sneezing.  Cough.  Sore throat.  Headache.  Fatigue.  Fever.  Loss of appetite.  Pain in your forehead, behind your eyes, and over your cheekbones (sinus pain).  Muscle aches.  How is this diagnosed? Your health care provider may diagnose a URI by:  Physical exam.  Tests to check that your symptoms are not due to another condition such as: ? Strep throat. ? Sinusitis. ? Pneumonia. ? Asthma.  How is this treated? A URI goes away on its own with time. It cannot be cured with medicines, but medicines may be prescribed or recommended to relieve symptoms. Medicines may help:  Reduce your fever.  Reduce your cough.  Relieve nasal congestion.  Follow these instructions at home:  Take medicines only as directed by your health care provider.  Gargle warm saltwater or take cough drops to comfort your throat as directed by your health care provider.  Use a warm mist humidifier or inhale steam from a shower to increase air moisture. This may make it easier to breathe.  Drink enough fluid to keep your urine clear or pale yellow.  Eat soups and other clear broths and maintain good nutrition.  Rest as needed.  Return to work when your temperature has returned to normal or as your health care provider advises. You may need to stay home longer to avoid infecting others. You can also use a face mask and careful hand washing to prevent  spread of the virus.  Increase the usage of your inhaler if you have asthma.  Do not use any tobacco products, including cigarettes, chewing tobacco, or electronic cigarettes. If you need help quitting, ask your health care provider. How is this prevented? The best way to protect yourself from getting a cold is to practice good hygiene.  Avoid oral or hand contact with people with cold symptoms.  Wash your hands often if contact occurs.  There is no  clear evidence that vitamin C, vitamin E, echinacea, or exercise reduces the chance of developing a cold. However, it is always recommended to get plenty of rest, exercise, and practice good nutrition. Contact a health care provider if:  You are getting worse rather than better.  Your symptoms are not controlled by medicine.  You have chills.  You have worsening shortness of breath.  You have brown or red mucus.  You have yellow or brown nasal discharge.  You have pain in your face, especially when you bend forward.  You have a fever.  You have swollen neck glands.  You have pain while swallowing.  You have white areas in the back of your throat. Get help right away if:  You have severe or persistent: ? Headache. ? Ear pain. ? Sinus pain. ? Chest pain.  You have chronic lung disease and any of the following: ? Wheezing. ? Prolonged cough. ? Coughing up blood. ? A change in your usual mucus.  You have a stiff neck.  You have changes in your: ? Vision. ? Hearing. ? Thinking. ? Mood. This information is not intended to replace advice given to you by your health care provider. Make sure you discuss any questions you have with your health care provider. Document Released: 07/18/2000 Document Revised: 09/25/2015 Document Reviewed: 04/29/2013 Elsevier Interactive Patient Education  2017 [employer].

## 2016-08-16 ENCOUNTER — Ambulatory Visit (INDEPENDENT_AMBULATORY_CARE_PROVIDER_SITE_OTHER): Payer: Medicare Other | Admitting: *Deleted

## 2016-08-16 ENCOUNTER — Telehealth: Payer: Self-pay

## 2016-08-16 DIAGNOSIS — Z7901 Long term (current) use of anticoagulants: Secondary | ICD-10-CM | POA: Diagnosis not present

## 2016-08-16 DIAGNOSIS — I4819 Other persistent atrial fibrillation: Secondary | ICD-10-CM

## 2016-08-16 DIAGNOSIS — I4891 Unspecified atrial fibrillation: Secondary | ICD-10-CM

## 2016-08-16 DIAGNOSIS — I481 Persistent atrial fibrillation: Secondary | ICD-10-CM | POA: Diagnosis not present

## 2016-08-16 DIAGNOSIS — Z5181 Encounter for therapeutic drug level monitoring: Secondary | ICD-10-CM

## 2016-08-16 DIAGNOSIS — R05 Cough: Secondary | ICD-10-CM

## 2016-08-16 DIAGNOSIS — R059 Cough, unspecified: Secondary | ICD-10-CM

## 2016-08-16 LAB — POCT INR: INR: 3.5

## 2016-08-16 NOTE — Telephone Encounter (Signed)
Pt left vm stating she was here 2 days ago but at that time didn't have a productive cough, she feels worse and SOB. She said KEFLEX is the only thing she can take without a yeast infection.

## 2016-08-17 ENCOUNTER — Other Ambulatory Visit: Payer: Self-pay | Admitting: Primary Care

## 2016-08-17 ENCOUNTER — Ambulatory Visit (INDEPENDENT_AMBULATORY_CARE_PROVIDER_SITE_OTHER)
Admission: RE | Admit: 2016-08-17 | Discharge: 2016-08-17 | Disposition: A | Discharge status: Home / Self Care | Payer: Medicare Other | Source: Ambulatory Visit | Attending: Primary Care | Admitting: Primary Care

## 2016-08-17 DIAGNOSIS — R0602 Shortness of breath: Secondary | ICD-10-CM | POA: Diagnosis not present

## 2016-08-17 DIAGNOSIS — R059 Cough, unspecified: Secondary | ICD-10-CM

## 2016-08-17 DIAGNOSIS — R05 Cough: Secondary | ICD-10-CM

## 2016-08-17 DIAGNOSIS — J069 Acute upper respiratory infection, unspecified: Secondary | ICD-10-CM

## 2016-08-17 MED ORDER — PREDNISONE 10 MG PO TABS: ORAL_TABLET | ORAL | 0 refills | Status: DC

## 2016-08-17 MED ORDER — ALBUTEROL SULFATE HFA 108 (90 BASE) MCG/ACT IN AERS: 2.0000 | INHALATION_SPRAY | RESPIRATORY_TRACT | 0 refills | Status: DC | PRN

## 2016-08-17 NOTE — Telephone Encounter (Addendum)
Please reassure patient that her symptoms are likely still viral in cause. Antibiotics like Keflex do not treat viruses so this will not help. I recommend she continue Mucinex for her mucous buildup. I'm happy to send in some cough pills if her cough is bothersome, please let me know. No antibiotics are needed at this point. Would recommend we check a chest xray to ensure no early infiltrate.

## 2016-08-17 NOTE — Telephone Encounter (Signed)
Noted, chest xray ordered. Will notify patient once this results. If she's feeling worse then should also be re-evaluated by a provider today.

## 2016-08-17 NOTE — Telephone Encounter (Signed)
This was addressed in the result of the x-ray on 08/17/16

## 2016-08-17 NOTE — Telephone Encounter (Signed)
Spoken and notified patient of [name]'s comments. Patient verbalized understanding.  Patient stated she is worse. She stated that she  needs something like cough medication and antibiotics  Patient is agreeable to the chest x-ray.

## 2016-09-06 ENCOUNTER — Ambulatory Visit (INDEPENDENT_AMBULATORY_CARE_PROVIDER_SITE_OTHER): Payer: Medicare Other | Admitting: *Deleted

## 2016-09-06 DIAGNOSIS — Z5181 Encounter for therapeutic drug level monitoring: Secondary | ICD-10-CM | POA: Diagnosis not present

## 2016-09-06 DIAGNOSIS — I4891 Unspecified atrial fibrillation: Secondary | ICD-10-CM | POA: Diagnosis not present

## 2016-09-06 DIAGNOSIS — I481 Persistent atrial fibrillation: Secondary | ICD-10-CM

## 2016-09-06 DIAGNOSIS — I4819 Other persistent atrial fibrillation: Secondary | ICD-10-CM

## 2016-09-06 DIAGNOSIS — Z7901 Long term (current) use of anticoagulants: Secondary | ICD-10-CM | POA: Diagnosis not present

## 2016-09-06 LAB — POCT INR: INR: 2.3

## 2016-09-11 ENCOUNTER — Encounter (HOSPITAL_COMMUNITY): Payer: Self-pay | Admitting: Nurse Practitioner

## 2016-09-11 ENCOUNTER — Ambulatory Visit (HOSPITAL_COMMUNITY)
Admission: RE | Admit: 2016-09-11 | Discharge: 2016-09-11 | Disposition: A | Discharge status: Home / Self Care | Payer: Medicare Other | Source: Ambulatory Visit | Attending: Nurse Practitioner | Admitting: Nurse Practitioner

## 2016-09-11 VITALS — BP 144/86 | HR 77 | Ht 64.0 in | Wt 285.6 lb

## 2016-09-11 DIAGNOSIS — I252 Old myocardial infarction: Secondary | ICD-10-CM | POA: Insufficient documentation

## 2016-09-11 DIAGNOSIS — E669 Obesity, unspecified: Secondary | ICD-10-CM | POA: Insufficient documentation

## 2016-09-11 DIAGNOSIS — I480 Paroxysmal atrial fibrillation: Secondary | ICD-10-CM | POA: Diagnosis not present

## 2016-09-11 DIAGNOSIS — I482 Chronic atrial fibrillation, unspecified: Secondary | ICD-10-CM

## 2016-09-11 DIAGNOSIS — I4891 Unspecified atrial fibrillation: Secondary | ICD-10-CM | POA: Diagnosis present

## 2016-09-11 DIAGNOSIS — I2510 Atherosclerotic heart disease of native coronary artery without angina pectoris: Secondary | ICD-10-CM | POA: Insufficient documentation

## 2016-09-11 DIAGNOSIS — I110 Hypertensive heart disease with heart failure: Secondary | ICD-10-CM | POA: Insufficient documentation

## 2016-09-11 DIAGNOSIS — Z7901 Long term (current) use of anticoagulants: Secondary | ICD-10-CM | POA: Diagnosis not present

## 2016-09-11 DIAGNOSIS — Z79899 Other long term (current) drug therapy: Secondary | ICD-10-CM | POA: Insufficient documentation

## 2016-09-11 DIAGNOSIS — I5032 Chronic diastolic (congestive) heart failure: Secondary | ICD-10-CM | POA: Insufficient documentation

## 2016-09-11 DIAGNOSIS — G4730 Sleep apnea, unspecified: Secondary | ICD-10-CM | POA: Diagnosis not present

## 2016-09-11 DIAGNOSIS — E785 Hyperlipidemia, unspecified: Secondary | ICD-10-CM | POA: Insufficient documentation

## 2016-09-11 LAB — COMPREHENSIVE METABOLIC PANEL
ALT: 22 U/L (ref 14–54)
AST: 28 U/L (ref 15–41)
Albumin: 3.5 g/dL (ref 3.5–5.0)
Alkaline Phosphatase: 75 U/L (ref 38–126)
Anion gap: 9 (ref 5–15)
BUN: 16 mg/dL (ref 6–20)
CALCIUM: 9.5 mg/dL (ref 8.9–10.3)
CHLORIDE: 104 mmol/L (ref 101–111)
CO2: 26 mmol/L (ref 22–32)
CREATININE: 1.27 mg/dL — AB (ref 0.44–1.00)
GFR, EST AFRICAN AMERICAN: 47 mL/min — AB (ref >60)
GFR, EST NON AFRICAN AMERICAN: 41 mL/min — AB (ref >60)
Glucose, Bld: 96 mg/dL (ref 65–99)
Potassium: 4.3 mmol/L (ref 3.5–5.1)
Sodium: 139 mmol/L (ref 135–145)
TOTAL PROTEIN: 6.6 g/dL (ref 6.5–8.1)
Total Bilirubin: 0.8 mg/dL (ref 0.3–1.2)

## 2016-09-11 LAB — TSH: TSH: 3.268 u[IU]/mL (ref 0.350–4.500)

## 2016-09-11 NOTE — Patient Instructions (Signed)
Your physician has recommended you make the following change in your medication:  1)Stop Amiodarone    

## 2016-09-11 NOTE — Progress Notes (Signed)
Patient ID: [name], female   DOB: February 27, 1945, 71 y.o.   MRN: [id number]     Primary Care Physician: Dianne Dun, MD Referring Physician: Dr. Hetty Ely is a 71 y.o. female with a h/o of CAD s/p PCI 2010, PAF, chronic diastolic CHF, HTN, HL, and apical hypertrophy. She is here today for  follow-up of in the afib clinic.    She has been placed on amiodarone for rate control by Dr. Johney Frame in 2016. She has failed amiodarone/tikosyn/cardioversions in the past. She has done "much better" and is not aware of episodes of afib. She is in afib, but rate controlled and does not really feel irregular heart beat.   She has chronic dyspnea with exertion, chronic fatigue,  probably mutifactorial. This was no better with sinus rhythm. She has sleep apnea, wears cpap regularly. She saw  Dr. Sharee Pimple for borderline abnormal PFT's and he thought no issue with lungs or amiodarone, obesity was contributing to her breathing issues.She is limited in her exercise ability due to bilateral knee replacement. She is  c/o chronic fatigue.  F/u 04/09/16,  She continues in rate controlled afib. Will try to reduce amiodarone to 100 mg a day to offset any side effects, and  see if still can keep good rate control. She is using cpap on a regular basis. Chronic c/o of exertional dyspnea and fatigue are unchanged.Amiodrone reduced to 100 mg a day since she nw has better rate control.  F/u 8/7, she remains in rate control afib with v rates in the 70's. Continues with chronic fatigue and shortness of breath. Ambulated with pulse ox with PO staying 95-98 % with ambulation on RA. Discussed stopping amiodarone since she has been better rate controlled.  Today, she denies symptoms of palpitations, chest pain,  orthopnea, PND, lower extremity edema, dizziness, presyncope, syncope, or neurologic sequela.  Positive for chronic shortness of breath/fatigue.The patient is tolerating medications without difficulties and is  otherwise without complaint today.   Past Medical History:  Diagnosis Date  . Arthritis   . CAD (coronary artery disease)    a. 07/2008 NSTEMI ->Cath: LM nl, LAD 70p/90p, 48m, LCX nl, RCA nl, EF 60%.  The LAD was stented with a 2.25x45mm Veri-Flex BMS.  b. 8/15 cath patent stent, nonobstructive disease otherwise - unchanged. Elevated troponin felt due to demand ischemia in setting of AF-RVR. c. Elevated trop 06/2014 felt due to demand ischemia.  . Chronic diastolic CHF (congestive heart failure) (HCC)    a. 07/2008 Echo: EF 60-65%, Triv AI/MR, Mild TR;  b. 09/2013 Echo: EF 60-65%, mild conc LVH.  [name] Difficult intubation   . Duodenitis   . Headache(784.0)   . History of positive PPD   . Hyperlipidemia   . Hypertension   . Morbid obesity (HCC)   . Osteoporosis   . PAF (paroxysmal atrial fibrillation) (HCC)    a. s/p multiple cardioversions;  b. 2010: on Tikosyn - dose decreased from to in setting of NSTEMI/QT prolongation. Did not hold NSR. c. admitted 08/2011 with loading at BID with DCCV at that time;  d. Chronic coumadin (CHA2DS2VASc = 5). e. 06/2014: Joice Lofts stopped due to ineffective; started on amiodarone.  . Renal insufficiency    a. Baseline Cr appears 1.1-1.2.   Past Surgical History:  Procedure Laterality Date  . ABDOMINAL HYSTERECTOMY     partial, bleeding  . CHOLECYSTECTOMY    . CORONARY ANGIOPLASTY WITH STENT PLACEMENT  2010  2.75- x 20-mm VeriFlex DES to the pLAD  . CORONARY STENT PLACEMENT    . ESOPHAGOGASTRODUODENOSCOPY  2003  . KNEE ARTHROSCOPY  2000 & 2001   left and right  . LEFT HEART CATHETERIZATION WITH CORONARY ANGIOGRAM N/A 09/24/2013   Procedure: LEFT HEART CATHETERIZATION WITH CORONARY ANGIOGRAM;  Surgeon: Micheline Chapman, MD;  [facility] [place], LAD stent patent, mLAD 50-60%, CFX [place], RCA 30-40%, EF 70%  . TOTAL KNEE ARTHROPLASTY     bilateral  . TUBAL LIGATION      Current Outpatient Prescriptions  Medication Sig Dispense Refill  .  acetaminophen (TYLENOL) 500 MG tablet Take 1,000 mg by mouth every 6 (six) hours as needed for headache.    . furosemide (LASIX) 40 MG tablet Take 1 tablet (40 mg total) by mouth daily. 90 tablet 2  . hydrOXYzine (ATARAX/VISTARIL) 10 MG tablet Take 1 tablet (10 mg total) by mouth 3 (three) times daily as needed for itching. 30 tablet 0  . magnesium oxide (MAG-OX) 400 MG tablet Take 1 tablet (400 mg total) by mouth daily. 90 tablet 2  . metoprolol (LOPRESSOR) 50 MG tablet Take 1 tablet (50 mg total) by mouth 2 (two) times daily. 180 tablet 3  . warfarin (COUMADIN) 2.5 MG tablet Take 1/2 to 1 tablet by mouth daily as directed by coumadin clinic 90 tablet 1  . albuterol (PROVENTIL HFA;VENTOLIN HFA) 108 (90 Base) MCG/ACT inhaler Inhale 2 puffs into the lungs every 4 (four) hours as needed. (Patient not taking: Reported on 09/11/2016) 1 Inhaler 0   No current facility-administered medications for this encounter.     Allergies  Allergen Reactions  . Statins Other (See Comments)    Muscle and joint pain    Social History   Social History  . Marital status: Widowed    Spouse name: N/A  . Number of children: 2  . Years of education: N/A   Occupational History  . retired Retired   Social History Main Topics  . Smoking status: Never Smoker  . Smokeless tobacco: Never Used  . Alcohol use No  . Drug use: No  . Sexual activity: Not Currently    Birth control/ protection: Post-menopausal   Other Topics Concern  . Not on file   Social History Narrative   Lives in [place], [place] w/ husband.    Family History  Problem Relation Age of Onset  . Other Mother        GI Bleed  . Pneumonia Father   . Coronary artery disease Brother   . Breast cancer Paternal Grandmother   . Rectal cancer Paternal Grandfather     ROS- All systems are reviewed and negative except as per the HPI above  Physical Exam: Vitals:   09/11/16 1012  BP: (!) 144/86  Pulse: 77  Weight: 285 lb 9.6 oz (129.5 kg)    Height: 5\' 4"  (1.626 m)    GEN- The patient is well appearing, alert and oriented x 3 today.   Head- normocephalic, atraumatic Eyes-  Sclera clear, conjunctiva pink Ears- hearing intact Oropharynx- clear Neck- supple, no JVP Lymph- no cervical lymphadenopathy Lungs- Clear to ausculation bilaterally, normal work of breathing Heart- Irregular rate and rhythm, no murmurs, rubs or gallops, PMI not laterally displaced GI- soft, NT, ND, + BS Extremities- no clubbing, cyanosis, or edema MS- no significant deformity or atrophy Skin- no rash or lesion Psych- euthymic mood, full affect Neuro- strength and sensation are intact  EKG-Afib at 71 bpm, ST/T wave abnormalities, unchanged  from previous. QRS int 90 ms, Qtc 443 ms Epic records reviewed Echo 11/02/16-Study Conclusions  - Left ventricle: The cavity size was normal. There was mild focal   basal hypertrophy of the septum. Systolic function was normal.   The estimated ejection fraction was in the range of 50% to 55%.   There is hypokinesis of the apical myocardium. Left ventricular   diastolic function parameters were normal. - Mitral valve: Calcified annulus. - Left atrium: The atrium was mildly dilated. Volume/bsa, ES,   (1-plane Simpson&'s, A2C): 39 ml/m^2. - Right atrium: The atrium was mildly dilated.  Impressions:  - Compared to the prior study, there has been no significant   interval change.   Assessment and Plan: 1. Chronic afib Rate controlled with amiodarone,it has better rate controlled since amiodarone was started and with 100 mg of amiodarone has stayed rate controlled in the 70's. Will try to stop amiodarone  With its potential side effects, to se if good rate  Control can be maintained Labs today, tsh, cmet Continue metoprolol  50 mg bid, but may have to be increase when amiodarone is out of system to maintain good v rate control   2. Sleep apnea Wearing cpap Ambulated in clinic with HR staying in the 80's  and PO staying 95-98%  3. Obesity Limited in exercise ability due to knee issues   Return in 4 weeks to assess rate control off amiodarone  Lupita Leash C. [facility] [address] [place], [place] [address] [phone]

## 2016-10-04 ENCOUNTER — Ambulatory Visit (INDEPENDENT_AMBULATORY_CARE_PROVIDER_SITE_OTHER): Payer: Medicare Other | Admitting: Pharmacist

## 2016-10-04 DIAGNOSIS — Z5181 Encounter for therapeutic drug level monitoring: Secondary | ICD-10-CM | POA: Diagnosis not present

## 2016-10-04 DIAGNOSIS — Z7901 Long term (current) use of anticoagulants: Secondary | ICD-10-CM | POA: Diagnosis not present

## 2016-10-04 DIAGNOSIS — I4891 Unspecified atrial fibrillation: Secondary | ICD-10-CM | POA: Diagnosis not present

## 2016-10-04 DIAGNOSIS — I481 Persistent atrial fibrillation: Secondary | ICD-10-CM | POA: Diagnosis not present

## 2016-10-04 DIAGNOSIS — I4819 Other persistent atrial fibrillation: Secondary | ICD-10-CM

## 2016-10-04 LAB — POCT INR: INR: 2.3

## 2016-10-12 ENCOUNTER — Encounter (HOSPITAL_COMMUNITY): Payer: Self-pay | Admitting: Nurse Practitioner

## 2016-10-12 ENCOUNTER — Ambulatory Visit (HOSPITAL_COMMUNITY)
Admission: RE | Admit: 2016-10-12 | Discharge: 2016-10-12 | Disposition: A | Discharge status: Home / Self Care | Payer: Medicare Other | Source: Ambulatory Visit | Attending: Nurse Practitioner | Admitting: Nurse Practitioner

## 2016-10-12 VITALS — BP 126/76 | HR 66 | Ht 64.0 in | Wt 281.8 lb

## 2016-10-12 DIAGNOSIS — Z90710 Acquired absence of both cervix and uterus: Secondary | ICD-10-CM | POA: Diagnosis not present

## 2016-10-12 DIAGNOSIS — G4730 Sleep apnea, unspecified: Secondary | ICD-10-CM | POA: Diagnosis not present

## 2016-10-12 DIAGNOSIS — I482 Chronic atrial fibrillation: Secondary | ICD-10-CM | POA: Insufficient documentation

## 2016-10-12 DIAGNOSIS — Z9851 Tubal ligation status: Secondary | ICD-10-CM | POA: Insufficient documentation

## 2016-10-12 DIAGNOSIS — Z7901 Long term (current) use of anticoagulants: Secondary | ICD-10-CM | POA: Insufficient documentation

## 2016-10-12 DIAGNOSIS — Z9049 Acquired absence of other specified parts of digestive tract: Secondary | ICD-10-CM | POA: Diagnosis not present

## 2016-10-12 DIAGNOSIS — Z79899 Other long term (current) drug therapy: Secondary | ICD-10-CM | POA: Diagnosis not present

## 2016-10-12 DIAGNOSIS — I4821 Permanent atrial fibrillation: Secondary | ICD-10-CM

## 2016-10-12 DIAGNOSIS — Z6841 Body Mass Index (BMI) 40.0 and over, adult: Secondary | ICD-10-CM | POA: Diagnosis not present

## 2016-10-12 DIAGNOSIS — I2510 Atherosclerotic heart disease of native coronary artery without angina pectoris: Secondary | ICD-10-CM | POA: Insufficient documentation

## 2016-10-12 DIAGNOSIS — I5032 Chronic diastolic (congestive) heart failure: Secondary | ICD-10-CM | POA: Diagnosis not present

## 2016-10-12 DIAGNOSIS — Z803 Family history of malignant neoplasm of breast: Secondary | ICD-10-CM | POA: Diagnosis not present

## 2016-10-12 DIAGNOSIS — I110 Hypertensive heart disease with heart failure: Secondary | ICD-10-CM | POA: Insufficient documentation

## 2016-10-12 NOTE — Progress Notes (Signed)
Patient ID: [name], female   DOB: [birth year], 71 y.o.   MRN: [id number]     Primary Care Physician: Dianne Dun, MD Referring Physician: Dr. Johney Frame Cardiologiat: Dr. [name] Lucy is a 71 y.o. female with a h/o of CAD s/p PCI 2010, PAF, chronic diastolic CHF, HTN, HL, and apical hypertrophy. She is here today for  follow-up of in the afib clinic.    She has been placed on amiodarone for rate control by Dr. Johney Frame in 2016. She has failed amiodarone/tikosyn/cardioversions in the past. She has done "much better" and is not aware of episodes of afib. She is in afib, but rate controlled and does not really feel irregular heart beat.   She has chronic dyspnea with exertion, chronic fatigue,  probably mutifactorial. This was no better with sinus rhythm. She has sleep apnea, wears cpap regularly. She saw  Dr. Sharee Pimple for borderline abnormal PFT's and he thought no issue with lungs or amiodarone, obesity was contributing to her breathing issues.She is limited in her exercise ability due to bilateral knee replacement. She is  c/o chronic fatigue.  F/u 04/09/16,  She continues in rate controlled afib. Will try to reduce amiodarone to 100 mg a day to offset any side effects, and  see if still can keep good rate control. She is using cpap on a regular basis. Chronic c/o of exertional dyspnea and fatigue are unchanged.Amiodrone reduced to 100 mg a day since she nw has better rate control.  F/u 09/11/16, she remains in rate control afib with v rates in the 70's. Continues with chronic fatigue and shortness of breath. Ambulated with pulse ox with PO staying 95-98 % with ambulation on RA. Discussed stopping amiodarone since she has been better rate controlled.  F/u in fib clinic, she is well rate controlled off amiodarone. It was started at one time for rate control and since her v rate has slowed over the years and potential side effects, it has been d/ced. EKG shows   afib with v rate in the 60's  today. She feels no different off amiodrone.  Today, she denies symptoms of palpitations, chest pain,  orthopnea, PND, lower extremity edema, dizziness, presyncope, syncope, or neurologic sequela.  Positive for chronic shortness of breath/fatigue.The patient is tolerating medications without difficulties and is otherwise without complaint today.   Past Medical History:  Diagnosis Date  . Arthritis   . CAD (coronary artery disease)    a. 07/2008 NSTEMI ->Cath: LM nl, LAD 70p/90p, 42m, LCX nl, RCA nl, EF 60%.  The LAD was stented with a 2.25x63mm Veri-Flex BMS.  b. 8/15 cath patent stent, nonobstructive disease otherwise - unchanged. Elevated troponin felt due to demand ischemia in setting of AF-RVR. c. Elevated trop 06/2014 felt due to demand ischemia.  . Chronic diastolic CHF (congestive heart failure) (HCC)    a. 07/2008 Echo: EF 60-65%, Triv AI/MR, Mild TR;  b. 09/2013 Echo: EF 60-65%, mild conc LVH.  [name] Difficult intubation   . Duodenitis   . Headache(784.0)   . History of positive PPD   . Hyperlipidemia   . Hypertension   . Morbid obesity (HCC)   . Osteoporosis   . PAF (paroxysmal atrial fibrillation) (HCC)    a. s/p multiple cardioversions;  b. 2010: on Tikosyn - dose decreased from to in setting of NSTEMI/QT prolongation. Did not hold NSR. c. admitted 08/2011 with loading at BID with DCCV at that time;  d. Chronic coumadin (  CHA2DS2VASc = 5). e. 06/2014: Joice Lofts stopped due to ineffective; started on amiodarone.  . Renal insufficiency    a. Baseline Cr appears 1.1-1.2.   Past Surgical History:  Procedure Laterality Date  . ABDOMINAL HYSTERECTOMY     partial, bleeding  . CHOLECYSTECTOMY    . CORONARY ANGIOPLASTY WITH STENT PLACEMENT  2010   2.75- x 20-mm VeriFlex DES to the pLAD  . CORONARY STENT PLACEMENT    . ESOPHAGOGASTRODUODENOSCOPY  2003  . KNEE ARTHROSCOPY  2000 & 2001   left and right  . LEFT HEART CATHETERIZATION WITH CORONARY ANGIOGRAM N/A 09/24/2013    Procedure: LEFT HEART CATHETERIZATION WITH CORONARY ANGIOGRAM;  Surgeon: Micheline Chapman, MD;  [facility] [place], LAD stent patent, mLAD 50-60%, CFX [place], RCA 30-40%, EF 70%  . TOTAL KNEE ARTHROPLASTY     bilateral  . TUBAL LIGATION      Current Outpatient Prescriptions  Medication Sig Dispense Refill  . acetaminophen (TYLENOL) 500 MG tablet Take 1,000 mg by mouth every 6 (six) hours as needed for headache.    . albuterol (PROVENTIL HFA;VENTOLIN HFA) 108 (90 Base) MCG/ACT inhaler Inhale 2 puffs into the lungs every 4 (four) hours as needed. 1 Inhaler 0  . furosemide (LASIX) 40 MG tablet Take 1 tablet (40 mg total) by mouth daily. 90 tablet 2  . magnesium oxide (MAG-OX) 400 MG tablet Take 1 tablet (400 mg total) by mouth daily. 90 tablet 2  . metoprolol (LOPRESSOR) 50 MG tablet Take 1 tablet (50 mg total) by mouth 2 (two) times daily. 180 tablet 3  . warfarin (COUMADIN) 2.5 MG tablet Take 1/2 to 1 tablet by mouth daily as directed by coumadin clinic 90 tablet 1   No current facility-administered medications for this encounter.     Allergies  Allergen Reactions  . Statins Other (See Comments)    Muscle and joint pain    Social History   Social History  . Marital status: Widowed    Spouse name: N/A  . Number of children: 2  . Years of education: N/A   Occupational History  . retired Retired   Social History Main Topics  . Smoking status: Never Smoker  . Smokeless tobacco: Never Used  . Alcohol use No  . Drug use: No  . Sexual activity: Not Currently    Birth control/ protection: Post-menopausal   Other Topics Concern  . Not on file   Social History Narrative   Lives in [place], [place] w/ husband.    Family History  Problem Relation Age of Onset  . Other Mother        GI Bleed  . Pneumonia Father   . Coronary artery disease Brother   . Breast cancer Paternal Grandmother   . Rectal cancer Paternal Grandfather     ROS- All systems are reviewed and negative except as per the HPI  above  Physical Exam: Vitals:   10/12/16 1043  BP: 126/76  Pulse: 66  Weight: 281 lb 12.8 oz (127.8 kg)  Height:  (1.626 m)    GEN- The patient is well appearing, alert and oriented x 3 today.   Head- normocephalic, atraumatic Eyes-  Sclera clear, conjunctiva pink Ears- hearing intact Oropharynx- clear Neck- supple, no JVP Lymph- no cervical lymphadenopathy Lungs- Clear to ausculation bilaterally, normal work of breathing Heart- Irregular rate and rhythm, no murmurs, rubs or gallops, PMI not laterally displaced GI- soft, NT, ND, + BS Extremities- no clubbing, cyanosis, or edema MS- no significant deformity or  atrophy Skin- no rash or lesion Psych- euthymic mood, full affect Neuro- strength and sensation are intact  EKG-Afib at 66 bpm, ST/T wave abnormalities, unchanged from previous. QRS int 92 ms, Qtc 457 ms Epic records reviewed Echo 11/02/16-Study Conclusions  - Left ventricle: The cavity size was normal. There was mild focal   basal hypertrophy of the septum. Systolic function was normal.   The estimated ejection fraction was in the range of 50% to 55%.   There is hypokinesis of the apical myocardium. Left ventricular   diastolic function parameters were normal. - Mitral valve: Calcified annulus. - Left atrium: The atrium was mildly dilated. Volume/bsa, ES,   (1-plane Simpson&'s, A2C): 39 ml/m^2. - Right atrium: The atrium was mildly dilated.  Impressions:  - Compared to the prior study, there has been no significant   interval change.   Assessment and Plan: 1. Chronic afib Has failed tikosyn, amiodarone and multiple cardioversions Rate controlled  Amiodarone, previously used just for rate control, d/ced due to potential side effects and rate control being slower over recent years  Continue metoprolol  50 mg bid   Continue warfarin for a chadsvasc score of at least 4  2. Sleep apnea Wearing cpap  3. Obesity Limited in exercise ability due to knee  issues  4. CAD  stable  F/u with Dr. Antoine Poche,  she has fallen off his schedule afib clinic as needed  Elvina Sidle. [facility] [address] [place], [place] [address] [phone]

## 2016-10-25 ENCOUNTER — Telehealth: Payer: Self-pay | Admitting: Family Medicine

## 2016-10-25 NOTE — Telephone Encounter (Signed)
Patient requesting a refill, I don't see it on her current med list.  Last OV 11/16/15

## 2016-10-29 NOTE — Telephone Encounter (Signed)
Received fax saying PA for Hydroxyzine was declined, fax placed in Dr. Elmer Sow inbox

## 2016-11-01 ENCOUNTER — Ambulatory Visit (INDEPENDENT_AMBULATORY_CARE_PROVIDER_SITE_OTHER): Payer: Medicare Other | Admitting: *Deleted

## 2016-11-01 DIAGNOSIS — Z5181 Encounter for therapeutic drug level monitoring: Secondary | ICD-10-CM | POA: Diagnosis not present

## 2016-11-01 DIAGNOSIS — I4891 Unspecified atrial fibrillation: Secondary | ICD-10-CM | POA: Diagnosis not present

## 2016-11-01 DIAGNOSIS — Z7901 Long term (current) use of anticoagulants: Secondary | ICD-10-CM | POA: Diagnosis not present

## 2016-11-01 DIAGNOSIS — I4819 Other persistent atrial fibrillation: Secondary | ICD-10-CM

## 2016-11-01 LAB — POCT INR: INR: 2.2

## 2016-11-15 ENCOUNTER — Ambulatory Visit (INDEPENDENT_AMBULATORY_CARE_PROVIDER_SITE_OTHER): Payer: Medicare Other | Admitting: Pulmonary Disease

## 2016-11-15 ENCOUNTER — Encounter: Payer: Self-pay | Admitting: Pulmonary Disease

## 2016-11-15 VITALS — BP 128/74 | HR 78 | Ht 65.0 in | Wt 282.4 lb

## 2016-11-15 DIAGNOSIS — Z23 Encounter for immunization: Secondary | ICD-10-CM

## 2016-11-15 DIAGNOSIS — G4733 Obstructive sleep apnea (adult) (pediatric): Secondary | ICD-10-CM

## 2016-11-15 NOTE — Assessment & Plan Note (Signed)
CPAP supplies will be renewed for a year  Weight loss encouraged, compliance with goal of at least 4-6 hrs every night is the expectation. Advised against medications with sedative side effects Cautioned against driving when sleepy - understanding that sleepiness will vary on a day to day basis  

## 2016-11-15 NOTE — Progress Notes (Signed)
   Subjective:    Patient ID: [name], female    DOB: Mar 13, 1945, 71 y.o.   MRN: [id number]  HPI  71 y.o obese woman for FU of obstructive sleep apnea .  She has a history of CAD, atrial fibrillation, chronic diastolic CHF.  She has done reasonably well over the past year. She continues to have some sleep pressure but denies excessive daytime somnolence. CPAP has helped improve her somnolence and fatigue She is able to use her CPAP for 4 hours when she sleeps in bed and then gets out and sits on her recliner and sleeps in another 2-3 hours with her dogs on her lap  Her weight is more or less unchanged and remains high  Significant tests/ events reviewed     PSG 11/2005 >> AHI 49/h corrected by CPAP 15 cm (wt 200 lbs)   11/2011 Home study showed moderate OSA  11/2011 CPAP titration Events were corrected by 15 cm to absence of events and 17 cm to absence of snoring.       Review of Systems neg for any significant sore throat, dysphagia, itching, sneezing, nasal congestion or excess/ purulent secretions, fever, chills, sweats, unintended wt loss, pleuritic or exertional cp, hempoptysis, orthopnea pnd or change in chronic leg swelling. Also denies presyncope, palpitations, heartburn, abdominal pain, nausea, vomiting, diarrhea or change in bowel or urinary habits, dysuria,hematuria, rash, arthralgias, visual complaints, headache, numbness weakness or ataxia.      Objective:   Physical Exam  Gen. Pleasant, obese, in no distress ENT - no lesions, no post nasal drip Neck: No JVD, no thyromegaly, no carotid bruits Lungs: no use of accessory muscles, no dullness to percussion, decreased without rales or rhonchi  Cardiovascular: Rhythm regular, heart sounds  normal, no murmurs or gallops, no peripheral edema Musculoskeletal: No deformities, no cyanosis or clubbing , no tremors       Assessment & Plan:

## 2016-11-15 NOTE — Patient Instructions (Signed)
CPAP supplies will be renewed for a year Flu shot today

## 2016-11-15 NOTE — Assessment & Plan Note (Signed)
Weight loss encouraged 

## 2016-11-29 ENCOUNTER — Ambulatory Visit (INDEPENDENT_AMBULATORY_CARE_PROVIDER_SITE_OTHER): Payer: Medicare Other | Admitting: *Deleted

## 2016-11-29 DIAGNOSIS — Z7901 Long term (current) use of anticoagulants: Secondary | ICD-10-CM | POA: Diagnosis not present

## 2016-11-29 DIAGNOSIS — I4819 Other persistent atrial fibrillation: Secondary | ICD-10-CM

## 2016-11-29 DIAGNOSIS — I4891 Unspecified atrial fibrillation: Secondary | ICD-10-CM

## 2016-11-29 DIAGNOSIS — Z5181 Encounter for therapeutic drug level monitoring: Secondary | ICD-10-CM | POA: Diagnosis not present

## 2016-11-29 LAB — POCT INR: INR: 2.4

## 2017-01-10 ENCOUNTER — Ambulatory Visit (INDEPENDENT_AMBULATORY_CARE_PROVIDER_SITE_OTHER): Payer: Medicare Other

## 2017-01-10 DIAGNOSIS — I4891 Unspecified atrial fibrillation: Secondary | ICD-10-CM | POA: Diagnosis not present

## 2017-01-10 DIAGNOSIS — Z7901 Long term (current) use of anticoagulants: Secondary | ICD-10-CM | POA: Diagnosis not present

## 2017-01-10 DIAGNOSIS — Z5181 Encounter for therapeutic drug level monitoring: Secondary | ICD-10-CM

## 2017-01-10 DIAGNOSIS — I481 Persistent atrial fibrillation: Secondary | ICD-10-CM | POA: Diagnosis not present

## 2017-01-10 DIAGNOSIS — I4819 Other persistent atrial fibrillation: Secondary | ICD-10-CM

## 2017-01-10 LAB — POCT INR: INR: 2.1

## 2017-01-10 NOTE — Patient Instructions (Signed)
Continue on same dosage 1/2 tablet daily except 1 tablet on Tuesdays, Thursdays, and Saturdays.  Recheck INR in 6 weeks. Call with any questions: [phone] 

## 2017-02-19 ENCOUNTER — Encounter: Payer: Self-pay | Admitting: Cardiology

## 2017-02-22 ENCOUNTER — Ambulatory Visit (INDEPENDENT_AMBULATORY_CARE_PROVIDER_SITE_OTHER): Payer: Medicare Other | Admitting: *Deleted

## 2017-02-22 DIAGNOSIS — H6123 Impacted cerumen, bilateral: Secondary | ICD-10-CM | POA: Diagnosis not present

## 2017-02-22 DIAGNOSIS — I4819 Other persistent atrial fibrillation: Secondary | ICD-10-CM

## 2017-02-22 DIAGNOSIS — Z7901 Long term (current) use of anticoagulants: Secondary | ICD-10-CM | POA: Diagnosis not present

## 2017-02-22 DIAGNOSIS — Z5181 Encounter for therapeutic drug level monitoring: Secondary | ICD-10-CM

## 2017-02-22 DIAGNOSIS — I4891 Unspecified atrial fibrillation: Secondary | ICD-10-CM

## 2017-02-22 DIAGNOSIS — I481 Persistent atrial fibrillation: Secondary | ICD-10-CM | POA: Diagnosis not present

## 2017-02-22 LAB — POCT INR: INR: 2.2

## 2017-02-22 NOTE — Patient Instructions (Signed)
Description    Continue on same dosage 1/2 tablet daily except 1 tablet on Tuesdays, Thursdays, and Saturdays.  Recheck INR in 6 weeks. Call with any questions: [phone]    

## 2017-03-04 DIAGNOSIS — R21 Rash and other nonspecific skin eruption: Secondary | ICD-10-CM | POA: Diagnosis not present

## 2017-03-04 DIAGNOSIS — L2089 Other atopic dermatitis: Secondary | ICD-10-CM | POA: Diagnosis not present

## 2017-03-05 NOTE — Progress Notes (Signed)
Cardiology Office Note   Date:  03/07/2017   ID:  [name], DOB 1945/07/21, MRN [id number]  PCP:  Dianne Dun, MD  Cardiologist:   No primary care provider on file.   Chief Complaint  Patient presents with  . Atrial Fibrillation      History of Present Illness: [name] is a 72 y.o. female who presents for follow up of atrial fib.  I have not seen her since 2016.  She has been seen in the Atrial Fib Clinic.   She was placed on amiodarone for rate control by Dr. Johney Frame in 2016. She has failed amiodarone/tikosyn/cardioversions in the past.  This was eventually discontinued as she had reasonable rate control without the amio.    Since she was last seen she done relatively well.  She gets around slowly with joint problems.  She gets some chronic dyspnea with activities but this is been unchanged.  She does not feel the palpitations and has no presyncope or syncope.  She has no issues tolerating her anticoagulation.   Past Medical History:  Diagnosis Date  . Arthritis   . CAD (coronary artery disease)    a. 07/2008 NSTEMI ->Cath: LM nl, LAD 70p/90p, 29m, LCX nl, RCA nl, EF 60%.  The LAD was stented with a 2.25x24mm Veri-Flex BMS.  b. 8/15 cath patent stent, nonobstructive disease otherwise - unchanged. Elevated troponin felt due to demand ischemia in setting of AF-RVR. c. Elevated trop 06/2014 felt due to demand ischemia.  . Chronic diastolic CHF (congestive heart failure) (HCC)    a. 07/2008 Echo: EF 60-65%, Triv AI/MR, Mild TR;  b. 09/2013 Echo: EF 60-65%, mild conc LVH.  [name] Difficult intubation   . Duodenitis   . Headache(784.0)   . History of positive PPD   . Hyperlipidemia   . Hypertension   . Morbid obesity (HCC)   . Osteoporosis   . PAF (paroxysmal atrial fibrillation) (HCC)    a. s/p multiple cardioversions;  b. 2010: on Tikosyn - dose decreased from to in setting of NSTEMI/QT prolongation. Did not hold NSR. c. admitted 08/2011 with loading at  BID with DCCV at that time;  d. Chronic coumadin (CHA2DS2VASc = 5). e. 06/2014: Joice Lofts stopped due to ineffective; started on amiodarone.  . Renal insufficiency    a. Baseline Cr appears 1.1-1.2.    Past Surgical History:  Procedure Laterality Date  . ABDOMINAL HYSTERECTOMY     partial, bleeding  . CHOLECYSTECTOMY    . CORONARY ANGIOPLASTY WITH STENT PLACEMENT  2010   2.75- x 20-mm VeriFlex DES to the pLAD  . CORONARY STENT PLACEMENT    . ESOPHAGOGASTRODUODENOSCOPY  2003  . KNEE ARTHROSCOPY  2000 & 2001   left and right  . LEFT HEART CATHETERIZATION WITH CORONARY ANGIOGRAM N/A 09/24/2013   Procedure: LEFT HEART CATHETERIZATION WITH CORONARY ANGIOGRAM;  Surgeon: Micheline Chapman, MD;  [facility] [place], LAD stent patent, mLAD 50-60%, CFX [place], RCA 30-40%, EF 70%  . TOTAL KNEE ARTHROPLASTY     bilateral  . TUBAL LIGATION       Current Outpatient Medications  Medication Sig Dispense Refill  . acetaminophen (TYLENOL) 500 MG tablet Take 1,000 mg by mouth every 6 (six) hours as needed for headache.    . albuterol (PROVENTIL HFA;VENTOLIN HFA) 108 (90 Base) MCG/ACT inhaler Inhale 2 puffs into the lungs every 4 (four) hours as needed. 1 Inhaler 0  . furosemide (LASIX) 40 MG tablet Take 1 tablet (  40 mg total) by mouth daily. 90 tablet 3  . hydrOXYzine (ATARAX/VISTARIL) 10 MG tablet TAKE 1 TABLET (10 MG TOTAL) BY MOUTH 3 (THREE) TIMES DAILY AS NEEDED FOR ITCHING. 30 tablet 0  . magnesium oxide (MAG-OX) 400 MG tablet Take 1 tablet (400 mg total) by mouth daily. 90 tablet 2  . metoprolol tartrate (LOPRESSOR) 50 MG tablet Take 1 tablet (50 mg total) by mouth 2 (two) times daily. 180 tablet 3  . warfarin (COUMADIN) 2.5 MG tablet Take 1/2 to 1 tablet by mouth daily as directed by coumadin clinic 90 tablet 1   No current facility-administered medications for this visit.     Allergies:   Statins    ROS:  Please see the history of present illness.   Otherwise, review of systems are positive for none.   All  other systems are reviewed and negative.    PHYSICAL EXAM: VS:  BP (!) 149/80   Pulse 63   Ht 5\' 5"  (1.651 m)   Wt 287 lb 3.2 oz (130.3 kg)   BMI 47.79 kg/m  , BMI Body mass index is 47.79 kg/m. GENERAL:  Well appearing NECK:  No jugular venous distention, waveform within normal limits, carotid upstroke brisk and symmetric, no bruits, no thyromegaly LUNGS:  Clear to auscultation bilaterally BACK:  No CVA tenderness CHEST:  Unremarkable HEART:  PMI not displaced or sustained,S1 and S2 within normal limits, no S3, no clicks, no rubs, no murmurs, irregular ABD:  Flat, positive bowel sounds normal in frequency in pitch, no bruits, no rebound, no guarding, no midline pulsatile mass, no hepatomegaly, no splenomegaly EXT:  2 plus pulses throughout, no edema, no cyanosis no clubbing     EKG:  EKG is not ordered today.   Recent Labs: 09/11/2016: ALT 22; BUN 16; Creatinine, Ser 1.27; Potassium 4.3; Sodium 139; TSH 3.268    Lipid Panel    Component Value Date/Time   CHOL 189 11/02/2015 1242   TRIG 121.0 11/02/2015 1242   HDL 48.40 11/02/2015 1242   CHOLHDL 4 11/02/2015 1242   VLDL 24.2 11/02/2015 1242   LDLCALC 116 (H) 11/02/2015 1242      Wt Readings from Last 3 Encounters:  03/07/17 287 lb 3.2 oz (130.3 kg)  11/15/16 282 lb 6.4 oz (128.1 kg)  10/12/16 281 lb 12.8 oz (127.8 kg)      Other studies Reviewed: Additional studies/ records that were reviewed today include: None. Review of the above records demonstrates:  Please see elsewhere in the note.     ASSESSMENT AND PLAN:  CHRONIC ATRIAL FIB:  The patient  tolerates this rhythm and rate control and anticoagulation. We will continue with the meds as listed.  [name] has a CHA2DS2 - VASc score of 4.   SLEEP APNEA:  She uses CPAP.  No change in therapy.   OBESITY:  We have talked many times about weight loss with diet.    CAD: she has no new symptoms since her previous cath.  No change in therapy.  DYSPNEA:   This has been chronic and multifactorial.  It is unchanged.  No change in therapy.   HTN:   Blood pressure is elevated.  However, this is unusual.  No change in therapy is indicated.   Current medicines are reviewed at length with the patient today.  The patient does not have concerns regarding medicines.  The following changes have been made:  no change  Labs/ tests ordered today include: None No orders  of the defined types were placed in this encounter.    Disposition:   FU with me in one year.     Signed, Rollene Rotunda, MD  03/07/2017 10:38 AM    [facility]

## 2017-03-07 ENCOUNTER — Encounter: Payer: Self-pay | Admitting: Cardiology

## 2017-03-07 ENCOUNTER — Ambulatory Visit (INDEPENDENT_AMBULATORY_CARE_PROVIDER_SITE_OTHER): Payer: Medicare Other | Admitting: Cardiology

## 2017-03-07 VITALS — BP 149/80 | HR 63 | Ht 65.0 in | Wt 287.2 lb

## 2017-03-07 DIAGNOSIS — I2510 Atherosclerotic heart disease of native coronary artery without angina pectoris: Secondary | ICD-10-CM

## 2017-03-07 DIAGNOSIS — I482 Chronic atrial fibrillation, unspecified: Secondary | ICD-10-CM

## 2017-03-07 DIAGNOSIS — R0602 Shortness of breath: Secondary | ICD-10-CM

## 2017-03-07 DIAGNOSIS — I10 Essential (primary) hypertension: Secondary | ICD-10-CM | POA: Diagnosis not present

## 2017-03-07 MED ORDER — METOPROLOL TARTRATE 50 MG PO TABS: 50.0000 mg | ORAL_TABLET | Freq: Two times a day (BID) | ORAL | 3 refills | Status: DC

## 2017-03-07 MED ORDER — FUROSEMIDE 40 MG PO TABS: 40.0000 mg | ORAL_TABLET | Freq: Every day | ORAL | 3 refills | Status: DC

## 2017-03-07 NOTE — Patient Instructions (Signed)
Medication Instructions:  Continue current medications  If you need a refill on your cardiac medications before your next appointment, please call your pharmacy.  Labwork: None Ordered   Testing/Procedures: None Ordered  Follow-Up: Your physician wants you to follow-up in: 1 Year. You should receive a reminder letter in the mail two months in advance. If you do not receive a letter, please call our office [phone].    Thank you for choosing [facility]!!      

## 2017-03-25 DIAGNOSIS — L2089 Other atopic dermatitis: Secondary | ICD-10-CM | POA: Diagnosis not present

## 2017-03-25 DIAGNOSIS — L299 Pruritus, unspecified: Secondary | ICD-10-CM | POA: Diagnosis not present

## 2017-03-25 DIAGNOSIS — D692 Other nonthrombocytopenic purpura: Secondary | ICD-10-CM | POA: Diagnosis not present

## 2017-03-26 ENCOUNTER — Encounter (INDEPENDENT_AMBULATORY_CARE_PROVIDER_SITE_OTHER): Payer: Self-pay | Admitting: Orthopedic Surgery

## 2017-03-26 ENCOUNTER — Ambulatory Visit (INDEPENDENT_AMBULATORY_CARE_PROVIDER_SITE_OTHER): Payer: Medicare Other | Admitting: Orthopedic Surgery

## 2017-03-26 DIAGNOSIS — M65312 Trigger thumb, left thumb: Secondary | ICD-10-CM | POA: Diagnosis not present

## 2017-03-26 MED ORDER — LIDOCAINE HCL 1 % IJ SOLN
3.0000 mL | INTRAMUSCULAR | Status: AC | PRN
  Administered 2017-03-26: 3 mL

## 2017-03-26 MED ORDER — METHYLPREDNISOLONE ACETATE 40 MG/ML IJ SUSP
13.3300 mg | INTRAMUSCULAR | Status: AC | PRN
  Administered 2017-03-26: 13.33 mg

## 2017-03-26 MED ORDER — BUPIVACAINE HCL 0.25 % IJ SOLN
0.3300 mL | INTRAMUSCULAR | Status: AC | PRN
  Administered 2017-03-26: .33 mL

## 2017-03-26 NOTE — Progress Notes (Signed)
Office Visit Note   Patient: [name]           Date of Birth: Apr 12, 1945           MRN: [id number] Visit Date: 03/26/2017 Requested by: Dianne Dun, MD [address] [place], [place] [address] PCP: Dianne Dun, MD  Subjective: Chief Complaint  Patient presents with  . left trigger thumb    HPI: [name] is a patient with left trigger thumb.  Been going on for several weeks.  Denies any history of injury.  Does report pain.  States that the left thumb locks most times that she bends it but not every time.  She is left-hand dominant and retired.  She wears a thumb splint brace.  She describes no paresthesias in that left thumb.  She is on Coumadin for atrial fibrillation.              ROS: All systems reviewed are negative as they relate to the chief complaint within the history of present illness.  Patient denies  fevers or chills.   Assessment & Plan: Visit Diagnoses:  1. Trigger thumb, left thumb     Plan: Impression is left trigger thumb.  Plan is left trigger thumb tendon sheath injection.  This is done under ultrasound guidance today.  If her symptoms recur then she will have to come off the Coumadin and go on a Lovenox bridge and consider surgical intervention or living with it.  I will see her back as needed  Follow-Up Instructions: Return if symptoms worsen or fail to improve.   Orders:  No orders of the defined types were placed in this encounter.  No orders of the defined types were placed in this encounter.     Procedures: Hand/UE Inj: L thumb A1 for trigger finger on 03/26/2017 10:16 AM Indications: therapeutic Details: 25 G needle, volar approach Medications: 0.33 mL bupivacaine 0.25 %; 13.33 mg methylPREDNISolone acetate 40 MG/ML; 3 mL lidocaine 1 % Outcome: tolerated well, no immediate complications Procedure, treatment alternatives, risks and benefits explained, specific risks discussed. Consent was given by the patient. Immediately prior to  procedure a time out was called to verify the correct patient, procedure, equipment, support staff and site/side marked as required. Patient was prepped and draped in the usual sterile fashion.       Clinical Data: No additional findings.  Objective: Vital Signs: There were no vitals taken for this visit.  Physical Exam:   Constitutional: Patient appears well-developed HEENT:  Head: Normocephalic Eyes:EOM are normal Neck: Normal range of motion Cardiovascular: Normal rate Pulmonary/chest: Effort normal Neurologic: Patient is alert Skin: Skin is warm Psychiatric: Patient has normal mood and affect    Ortho Exam: Orthopedic exam demonstrates tenderness at the A1 pulley left thumb.  EPL FPL is functional and intact.  Grip strength is intact.  Radial pulse on that left hand side is intact and wrist range of motion is full.  No tenderness over the first dorsal compartment.  Specialty Comments:  No specialty comments available.  Imaging: No results found.   PMFS History: Patient Active Problem List   Diagnosis Date Noted  . Itching 11/16/2015  . Estrogen deficiency 11/02/2015  . Morbid obesity, unspecified obesity type (HCC) 01/08/2015  . Dyspnea 01/07/2015  . Apical variant hypertrophic cardiomyopathy (HCC) 09/24/2013  . NSTEMI (non-ST elevated myocardial infarction) (HCC) 09/22/2013  . Encounter for therapeutic drug monitoring 03/11/2013  . Chronic diastolic CHF (congestive heart failure) (HCC) 06/26/2011  .  Chronic anticoagulation 02/07/2011  . Coronary atherosclerosis 12/01/2008  . G E R D 08/30/2006  . LACTOSE INTOLERANCE 08/06/2006  . HYPERLIPIDEMIA 08/06/2006  . Obesity, morbid (HCC) 08/06/2006  . Essential hypertension 08/06/2006  . OVERACTIVE BLADDER 08/06/2006  . ROSACEA 08/06/2006  . Osteoarthritis 08/06/2006  . Obstructive sleep apnea syndrome 08/06/2006  . EDEMA 08/06/2006  . URINARY INCONTINENCE, MIXED 08/06/2006  . HYPERCHOLESTEROLEMIA, PURE  08/01/2006  . Persistent atrial fibrillation (HCC) 07/10/2006   Past Medical History:  Diagnosis Date  . Arthritis   . CAD (coronary artery disease)    a. 07/2008 NSTEMI ->Cath: LM nl, LAD 70p/90p, 68m, LCX nl, RCA nl, EF 60%.  The LAD was stented with a 2.25x44mm Veri-Flex BMS.  b. 8/15 cath patent stent, nonobstructive disease otherwise - unchanged. Elevated troponin felt due to demand ischemia in setting of AF-RVR. c. Elevated trop 06/2014 felt due to demand ischemia.  . Chronic diastolic CHF (congestive heart failure) (HCC)    a. 07/2008 Echo: EF 60-65%, Triv AI/MR, Mild TR;  b. 09/2013 Echo: EF 60-65%, mild conc LVH.  [name] Difficult intubation   . Duodenitis   . Headache(784.0)   . History of positive PPD   . Hyperlipidemia   . Hypertension   . Morbid obesity (HCC)   . Osteoporosis   . PAF (paroxysmal atrial fibrillation) (HCC)    a. s/p multiple cardioversions;  b. 2010: on Tikosyn - dose decreased from to in setting of NSTEMI/QT prolongation. Did not hold NSR. c. admitted 08/2011 with loading at BID with DCCV at that time;  d. Chronic coumadin (CHA2DS2VASc = 5). e. 06/2014: Joice Lofts stopped due to ineffective; started on amiodarone.  . Renal insufficiency    a. Baseline Cr appears 1.1-1.2.    Family History  Problem Relation Age of Onset  . Other Mother        GI Bleed  . Pneumonia Father   . Coronary artery disease Brother   . Breast cancer Paternal Grandmother   . Rectal cancer Paternal Grandfather     Past Surgical History:  Procedure Laterality Date  . ABDOMINAL HYSTERECTOMY     partial, bleeding  . CHOLECYSTECTOMY    . CORONARY ANGIOPLASTY WITH STENT PLACEMENT  2010   2.75- x 20-mm VeriFlex DES to the pLAD  . CORONARY STENT PLACEMENT    . ESOPHAGOGASTRODUODENOSCOPY  2003  . KNEE ARTHROSCOPY  2000 & 2001   left and right  . LEFT HEART CATHETERIZATION WITH CORONARY ANGIOGRAM N/A 09/24/2013   Procedure: LEFT HEART CATHETERIZATION WITH CORONARY ANGIOGRAM;   Surgeon: Micheline Chapman, MD;  [facility] [place], LAD stent patent, mLAD 50-60%, CFX [place], RCA 30-40%, EF 70%  . TOTAL KNEE ARTHROPLASTY     bilateral  . TUBAL LIGATION     Social History   Occupational History  . Occupation: retired    Associate Professor: RETIRED  Tobacco Use  . Smoking status: Never Smoker  . Smokeless tobacco: Never Used  Substance and Sexual Activity  . Alcohol use: No    Alcohol/week: 0.0 oz  . Drug use: No  . Sexual activity: Not Currently    Birth control/protection: Post-menopausal

## 2017-04-05 ENCOUNTER — Ambulatory Visit (INDEPENDENT_AMBULATORY_CARE_PROVIDER_SITE_OTHER): Payer: Medicare Other | Admitting: *Deleted

## 2017-04-05 DIAGNOSIS — I481 Persistent atrial fibrillation: Secondary | ICD-10-CM

## 2017-04-05 DIAGNOSIS — Z7901 Long term (current) use of anticoagulants: Secondary | ICD-10-CM | POA: Diagnosis not present

## 2017-04-05 DIAGNOSIS — I4891 Unspecified atrial fibrillation: Secondary | ICD-10-CM

## 2017-04-05 DIAGNOSIS — Z5181 Encounter for therapeutic drug level monitoring: Secondary | ICD-10-CM

## 2017-04-05 DIAGNOSIS — I4819 Other persistent atrial fibrillation: Secondary | ICD-10-CM

## 2017-04-05 LAB — POCT INR: INR: 2.1

## 2017-04-05 NOTE — Patient Instructions (Signed)
Description    Continue on same dosage 1/2 tablet daily except 1 tablet on Tuesdays, Thursdays, and Saturdays.  Recheck INR in 6 weeks. Call with any questions: [phone]    

## 2017-04-29 ENCOUNTER — Other Ambulatory Visit: Payer: Self-pay | Admitting: Cardiology

## 2017-04-29 DIAGNOSIS — Z5181 Encounter for therapeutic drug level monitoring: Secondary | ICD-10-CM

## 2017-04-29 DIAGNOSIS — Z7901 Long term (current) use of anticoagulants: Secondary | ICD-10-CM

## 2017-04-29 DIAGNOSIS — I4819 Other persistent atrial fibrillation: Secondary | ICD-10-CM

## 2017-04-29 DIAGNOSIS — I4891 Unspecified atrial fibrillation: Secondary | ICD-10-CM

## 2017-05-17 ENCOUNTER — Ambulatory Visit (INDEPENDENT_AMBULATORY_CARE_PROVIDER_SITE_OTHER): Payer: Medicare Other | Admitting: *Deleted

## 2017-05-17 DIAGNOSIS — I4891 Unspecified atrial fibrillation: Secondary | ICD-10-CM

## 2017-05-17 DIAGNOSIS — Z5181 Encounter for therapeutic drug level monitoring: Secondary | ICD-10-CM

## 2017-05-17 DIAGNOSIS — I481 Persistent atrial fibrillation: Secondary | ICD-10-CM | POA: Diagnosis not present

## 2017-05-17 DIAGNOSIS — Z7901 Long term (current) use of anticoagulants: Secondary | ICD-10-CM

## 2017-05-17 DIAGNOSIS — I4819 Other persistent atrial fibrillation: Secondary | ICD-10-CM

## 2017-05-17 LAB — POCT INR: INR: 2

## 2017-05-17 NOTE — Patient Instructions (Signed)
Description   Today take 1 tablet then continue on same dosage 1/2 tablet daily except 1 tablet on Tuesdays, Thursdays, and Saturdays.  Recheck INR in 6 weeks. Call with any questions: [phone]

## 2017-05-30 DIAGNOSIS — H2589 Other age-related cataract: Secondary | ICD-10-CM | POA: Diagnosis not present

## 2017-05-30 DIAGNOSIS — H524 Presbyopia: Secondary | ICD-10-CM | POA: Diagnosis not present

## 2017-05-30 DIAGNOSIS — H26491 Other secondary cataract, right eye: Secondary | ICD-10-CM | POA: Diagnosis not present

## 2017-05-30 DIAGNOSIS — H40011 Open angle with borderline findings, low risk, right eye: Secondary | ICD-10-CM | POA: Diagnosis not present

## 2017-05-30 DIAGNOSIS — Z961 Presence of intraocular lens: Secondary | ICD-10-CM | POA: Diagnosis not present

## 2017-06-04 ENCOUNTER — Ambulatory Visit (INDEPENDENT_AMBULATORY_CARE_PROVIDER_SITE_OTHER): Payer: Medicare Other | Admitting: Primary Care

## 2017-06-04 ENCOUNTER — Encounter: Payer: Self-pay | Admitting: Primary Care

## 2017-06-04 VITALS — BP 138/78 | HR 73 | Temp 98.0°F | Ht 65.0 in | Wt 285.0 lb

## 2017-06-04 DIAGNOSIS — I4819 Other persistent atrial fibrillation: Secondary | ICD-10-CM

## 2017-06-04 DIAGNOSIS — G4733 Obstructive sleep apnea (adult) (pediatric): Secondary | ICD-10-CM | POA: Diagnosis not present

## 2017-06-04 DIAGNOSIS — I481 Persistent atrial fibrillation: Secondary | ICD-10-CM | POA: Diagnosis not present

## 2017-06-04 DIAGNOSIS — I10 Essential (primary) hypertension: Secondary | ICD-10-CM

## 2017-06-04 DIAGNOSIS — I5032 Chronic diastolic (congestive) heart failure: Secondary | ICD-10-CM | POA: Diagnosis not present

## 2017-06-04 DIAGNOSIS — I2510 Atherosclerotic heart disease of native coronary artery without angina pectoris: Secondary | ICD-10-CM | POA: Diagnosis not present

## 2017-06-04 DIAGNOSIS — L299 Pruritus, unspecified: Secondary | ICD-10-CM | POA: Diagnosis not present

## 2017-06-04 DIAGNOSIS — I214 Non-ST elevation (NSTEMI) myocardial infarction: Secondary | ICD-10-CM

## 2017-06-04 MED ORDER — HYDROXYZINE HCL 10 MG PO TABS: 10.0000 mg | ORAL_TABLET | Freq: Every evening | ORAL | 0 refills | Status: DC | PRN

## 2017-06-04 NOTE — Assessment & Plan Note (Signed)
Managed per coumadin clinic in [place], continue same. Rate stable, rhythm irregular.

## 2017-06-04 NOTE — Assessment & Plan Note (Signed)
Compliant to CPAP, continue same. 

## 2017-06-04 NOTE — Progress Notes (Signed)
Subjective:    Patient ID: [name], female    DOB: 12-19-1945, 72 y.o.   MRN: [id number]  HPI  [name] is a 72 year old female who presents today to transfer care from Dr. Dayton Martes. She saw her cardiologist in January 2019, was told to come back in January 2020. She also follows with her dentist and optometrist regularly.   1) Essential Hypertension: Currently managed on metoprolol tartrate 50 mg twice daily. She denies chest pain, dizziness, shortness of breath.   BP Readings from Last 3 Encounters:  06/04/17 138/78  03/07/17 (!) 149/80  11/15/16 128/74     2) NSTEMI/CHF/Persistent Atrial Fibrillation: Currently managed on warfarin and is following with the Coumadin Clinic on [address]. Previously following with Atrial Fibrillation clinic and has undergone cardioversion x 4 without success. Previously managed on Amiodarone and Tikosyn at different times. Never undergone cardiac ablation as this was not suspect to be successful in her case.  She is currently following with Dr. Antoine Poche. She takes furosemide 40 mg once daily for CHF, she does not weigh herself daily. She denies increased shortness of breath. She does not exercise.  3) Pruritis/Dermatitis: Chronic. Currently managed on hydroxyzine 10 mg at bedtime for which she takes most days of the week with improvement. Most of her itching is located to her back.   4) OSA: Compliant with CPAP machine nightly. She follows with pulmonology for equipment evaluation.   Review of Systems  Eyes: Negative for visual disturbance.  Respiratory: Negative for shortness of breath.   Cardiovascular: Negative for chest pain.       Chronic lower extremity edema  Skin:       Chronic pruritus without rash  Neurological: Negative for dizziness and headaches.       Past Medical History:  Diagnosis Date  . Arthritis   . CAD (coronary artery disease)    a. 07/2008 NSTEMI ->Cath: LM nl, LAD 70p/90p, 51m, LCX nl, RCA nl, EF 60%.  The LAD  was stented with a 2.25x40mm Veri-Flex BMS.  b. 8/15 cath patent stent, nonobstructive disease otherwise - unchanged. Elevated troponin felt due to demand ischemia in setting of AF-RVR. c. Elevated trop 06/2014 felt due to demand ischemia.  . Chronic diastolic CHF (congestive heart failure) (HCC)    a. 07/2008 Echo: EF 60-65%, Triv AI/MR, Mild TR;  b. 09/2013 Echo: EF 60-65%, mild conc LVH.  [name] Difficult intubation   . Duodenitis   . Headache(784.0)   . History of positive PPD   . Hyperlipidemia   . Hypertension   . Morbid obesity (HCC)   . Osteoporosis   . PAF (paroxysmal atrial fibrillation) (HCC)    a. s/p multiple cardioversions;  b. 2010: on Tikosyn - dose decreased from to in setting of NSTEMI/QT prolongation. Did not hold NSR. c. admitted 08/2011 with loading at BID with DCCV at that time;  d. Chronic coumadin (CHA2DS2VASc = 5). e. 06/2014: Joice Lofts stopped due to ineffective; started on amiodarone.  . Renal insufficiency    a. Baseline Cr appears 1.1-1.2.     Social History   Socioeconomic History  . Marital status: Widowed    Spouse name: Not on file  . Number of children: 2  . Years of education: Not on file  . Highest education level: Not on file  Occupational History  . Occupation: retired    Associate Professor: RETIRED  Social Needs  . Financial resource strain: Not on file  . Food insecurity:  Worry: Not on file    Inability: Not on file  . Transportation needs:    Medical: Not on file    Non-medical: Not on file  Tobacco Use  . Smoking status: Never Smoker  . Smokeless tobacco: Never Used  Substance and Sexual Activity  . Alcohol use: No    Alcohol/week: 0.0 oz  . Drug use: No  . Sexual activity: Not Currently    Birth control/protection: Post-menopausal  Lifestyle  . Physical activity:    Days per week: Not on file    Minutes per session: Not on file  . Stress: Not on file  Relationships  . Social connections:    Talks on phone: Not on file     Gets together: Not on file    Attends religious service: Not on file    Active member of club or organization: Not on file    Attends meetings of clubs or organizations: Not on file    Relationship status: Not on file  . Intimate partner violence:    Fear of current or ex partner: Not on file    Emotionally abused: Not on file    Physically abused: Not on file    Forced sexual activity: Not on file  Other Topics Concern  . Not on file  Social History Narrative   Lives in [place], [place] w/ husband.    Past Surgical History:  Procedure Laterality Date  . ABDOMINAL HYSTERECTOMY     partial, bleeding  . CHOLECYSTECTOMY    . CORONARY ANGIOPLASTY WITH STENT PLACEMENT  2010   2.75- x 20-mm VeriFlex DES to the pLAD  . CORONARY STENT PLACEMENT    . ESOPHAGOGASTRODUODENOSCOPY  2003  . KNEE ARTHROSCOPY  2000 & 2001   left and right  . LEFT HEART CATHETERIZATION WITH CORONARY ANGIOGRAM N/A 09/24/2013   Procedure: LEFT HEART CATHETERIZATION WITH CORONARY ANGIOGRAM;  Surgeon: Micheline Chapman, MD;  [facility] [place], LAD stent patent, mLAD 50-60%, CFX [place], RCA 30-40%, EF 70%  . TOTAL KNEE ARTHROPLASTY     bilateral  . TUBAL LIGATION      Family History  Problem Relation Age of Onset  . Other Mother        GI Bleed  . Pneumonia Father   . Coronary artery disease Brother   . Breast cancer Paternal Grandmother   . Rectal cancer Paternal Grandfather     Allergies  Allergen Reactions  . Statins Other (See Comments)    Muscle and joint pain    Current Outpatient Medications on File Prior to Visit  Medication Sig Dispense Refill  . acetaminophen (TYLENOL) 500 MG tablet Take 1,000 mg by mouth every 6 (six) hours as needed for headache.    . furosemide (LASIX) 40 MG tablet Take 1 tablet (40 mg total) by mouth daily. 90 tablet 3  . magnesium oxide (MAG-OX) 400 MG tablet Take 1 tablet (400 mg total) by mouth daily. 90 tablet 2  . metoprolol tartrate (LOPRESSOR) 50 MG tablet Take 1 tablet (50 mg  total) by mouth 2 (two) times daily. 180 tablet 3  . warfarin (COUMADIN) 2.5 MG tablet Take 1/2 to 1 tablet by mouth daily as directed by coumadin clinic 90 tablet 1  . warfarin (COUMADIN) 2.5 MG tablet TAKE 1/2 TO 1 TABLET BY MOUTH DAILY AS DIRECTED BY COUMADIN CLINIC 90 tablet 0   No current facility-administered medications on file prior to visit.     BP 138/78   Pulse 73   Temp  98 F (36.7 C) (Oral)   Ht 5\' 5"  (1.651 m)   Wt 285 lb (129.3 kg)   SpO2 98%   BMI 47.43 kg/m    Objective:   Physical Exam  Constitutional: She is oriented to person, place, and time. She appears well-nourished.  Neck: Neck supple.  Cardiovascular: Normal rate. An irregularly irregular rhythm present.  Bilateral lower extremity edema, no pitting.  Pulmonary/Chest: Effort normal and breath sounds normal.  Neurological: She is alert and oriented to person, place, and time.  Skin: Skin is warm and dry.  Psychiatric: She has a normal mood and affect.          Assessment & Plan:

## 2017-06-04 NOTE — Patient Instructions (Signed)
I sent refills for your hydroxyzine to the pharmacy.  Follow up with your cardiologist as scheduled.   It was a pleasure meeting you!

## 2017-06-04 NOTE — Assessment & Plan Note (Signed)
No rash on exam, refilled hydroxyzine.

## 2017-06-04 NOTE — Assessment & Plan Note (Signed)
Following with cardiology. Continue furosemide and beta blocker. Recommended to start weighing daily and report gain of 2 pounds or greater in 24 hours and 5 pounds or greater in 7 days.

## 2017-06-04 NOTE — Assessment & Plan Note (Signed)
Stable in the office today, continue metoprolol tartrate.  

## 2017-06-04 NOTE — Assessment & Plan Note (Signed)
Denies chest pain. Continue BP control, beta blocker.

## 2017-06-28 ENCOUNTER — Ambulatory Visit (INDEPENDENT_AMBULATORY_CARE_PROVIDER_SITE_OTHER): Payer: Medicare Other | Admitting: Pharmacist

## 2017-06-28 DIAGNOSIS — I4891 Unspecified atrial fibrillation: Secondary | ICD-10-CM

## 2017-06-28 DIAGNOSIS — Z7901 Long term (current) use of anticoagulants: Secondary | ICD-10-CM

## 2017-06-28 DIAGNOSIS — I422 Other hypertrophic cardiomyopathy: Secondary | ICD-10-CM | POA: Diagnosis not present

## 2017-06-28 DIAGNOSIS — I481 Persistent atrial fibrillation: Secondary | ICD-10-CM

## 2017-06-28 DIAGNOSIS — I4819 Other persistent atrial fibrillation: Secondary | ICD-10-CM

## 2017-06-28 DIAGNOSIS — Z5181 Encounter for therapeutic drug level monitoring: Secondary | ICD-10-CM

## 2017-06-28 LAB — POCT INR: INR: 2.2 (ref 2.0–3.0)

## 2017-06-28 NOTE — Patient Instructions (Signed)
Continue on same dosage 1/2 tablet daily except 1 tablet on Tuesdays, Thursdays, and Saturdays.  Recheck INR in 6 weeks. Call with any questions: [phone]

## 2017-07-28 ENCOUNTER — Other Ambulatory Visit: Payer: Self-pay | Admitting: Cardiology

## 2017-07-28 DIAGNOSIS — I4891 Unspecified atrial fibrillation: Secondary | ICD-10-CM

## 2017-07-28 DIAGNOSIS — Z7901 Long term (current) use of anticoagulants: Secondary | ICD-10-CM

## 2017-07-28 DIAGNOSIS — Z5181 Encounter for therapeutic drug level monitoring: Secondary | ICD-10-CM

## 2017-07-28 DIAGNOSIS — I4819 Other persistent atrial fibrillation: Secondary | ICD-10-CM

## 2017-08-09 ENCOUNTER — Ambulatory Visit (INDEPENDENT_AMBULATORY_CARE_PROVIDER_SITE_OTHER): Payer: Medicare Other | Admitting: *Deleted

## 2017-08-09 DIAGNOSIS — Z5181 Encounter for therapeutic drug level monitoring: Secondary | ICD-10-CM

## 2017-08-09 DIAGNOSIS — I481 Persistent atrial fibrillation: Secondary | ICD-10-CM | POA: Diagnosis not present

## 2017-08-09 DIAGNOSIS — Z7901 Long term (current) use of anticoagulants: Secondary | ICD-10-CM | POA: Diagnosis not present

## 2017-08-09 DIAGNOSIS — I4891 Unspecified atrial fibrillation: Secondary | ICD-10-CM

## 2017-08-09 DIAGNOSIS — I4819 Other persistent atrial fibrillation: Secondary | ICD-10-CM

## 2017-08-09 LAB — POCT INR: INR: 2.6 (ref 2.0–3.0)

## 2017-08-09 NOTE — Patient Instructions (Signed)
Description    Continue on same dosage 1/2 tablet daily except 1 tablet on Tuesdays, Thursdays, and Saturdays.  Recheck INR in 6 weeks. Call with any questions: [phone]    

## 2017-09-03 ENCOUNTER — Other Ambulatory Visit: Payer: Self-pay | Admitting: Primary Care

## 2017-09-03 DIAGNOSIS — L299 Pruritus, unspecified: Secondary | ICD-10-CM

## 2017-09-20 ENCOUNTER — Ambulatory Visit (INDEPENDENT_AMBULATORY_CARE_PROVIDER_SITE_OTHER): Payer: Medicare Other | Admitting: *Deleted

## 2017-09-20 DIAGNOSIS — I4819 Other persistent atrial fibrillation: Secondary | ICD-10-CM

## 2017-09-20 DIAGNOSIS — Z5181 Encounter for therapeutic drug level monitoring: Secondary | ICD-10-CM | POA: Diagnosis not present

## 2017-09-20 DIAGNOSIS — Z7901 Long term (current) use of anticoagulants: Secondary | ICD-10-CM

## 2017-09-20 DIAGNOSIS — I481 Persistent atrial fibrillation: Secondary | ICD-10-CM | POA: Diagnosis not present

## 2017-09-20 DIAGNOSIS — I4891 Unspecified atrial fibrillation: Secondary | ICD-10-CM

## 2017-09-20 LAB — POCT INR: INR: 2.5 (ref 2.0–3.0)

## 2017-09-20 NOTE — Patient Instructions (Signed)
Description    Continue on same dosage 1/2 tablet daily except 1 tablet on Tuesdays, Thursdays, and Saturdays.  Recheck INR in 6 weeks. Call with any questions: [phone]    

## 2017-10-25 ENCOUNTER — Other Ambulatory Visit: Payer: Self-pay | Admitting: Internal Medicine

## 2017-10-25 DIAGNOSIS — I4891 Unspecified atrial fibrillation: Secondary | ICD-10-CM

## 2017-10-25 DIAGNOSIS — I4819 Other persistent atrial fibrillation: Secondary | ICD-10-CM

## 2017-10-25 DIAGNOSIS — Z5181 Encounter for therapeutic drug level monitoring: Secondary | ICD-10-CM

## 2017-10-25 DIAGNOSIS — Z7901 Long term (current) use of anticoagulants: Secondary | ICD-10-CM

## 2017-11-01 ENCOUNTER — Ambulatory Visit (INDEPENDENT_AMBULATORY_CARE_PROVIDER_SITE_OTHER): Payer: Medicare Other | Admitting: *Deleted

## 2017-11-01 ENCOUNTER — Encounter (INDEPENDENT_AMBULATORY_CARE_PROVIDER_SITE_OTHER): Payer: Self-pay

## 2017-11-01 DIAGNOSIS — Z5181 Encounter for therapeutic drug level monitoring: Secondary | ICD-10-CM | POA: Diagnosis not present

## 2017-11-01 DIAGNOSIS — I481 Persistent atrial fibrillation: Secondary | ICD-10-CM

## 2017-11-01 DIAGNOSIS — I4891 Unspecified atrial fibrillation: Secondary | ICD-10-CM | POA: Diagnosis not present

## 2017-11-01 DIAGNOSIS — Z7901 Long term (current) use of anticoagulants: Secondary | ICD-10-CM

## 2017-11-01 DIAGNOSIS — I4819 Other persistent atrial fibrillation: Secondary | ICD-10-CM

## 2017-11-01 LAB — POCT INR: INR: 2.3 (ref 2.0–3.0)

## 2017-11-01 NOTE — Patient Instructions (Signed)
Description    Continue on same dosage 1/2 tablet daily except 1 tablet on Tuesdays, Thursdays, and Saturdays.  Recheck INR in 6 weeks. Call with any questions: [phone]    

## 2017-11-05 ENCOUNTER — Ambulatory Visit (INDEPENDENT_AMBULATORY_CARE_PROVIDER_SITE_OTHER): Payer: Medicare Other | Admitting: Internal Medicine

## 2017-11-05 ENCOUNTER — Encounter: Payer: Self-pay | Admitting: Internal Medicine

## 2017-11-05 VITALS — BP 140/82 | HR 94 | Temp 98.4°F | Wt 282.0 lb

## 2017-11-05 DIAGNOSIS — Z23 Encounter for immunization: Secondary | ICD-10-CM

## 2017-11-05 DIAGNOSIS — R350 Frequency of micturition: Secondary | ICD-10-CM

## 2017-11-05 DIAGNOSIS — L255 Unspecified contact dermatitis due to plants, except food: Secondary | ICD-10-CM

## 2017-11-05 DIAGNOSIS — I2510 Atherosclerotic heart disease of native coronary artery without angina pectoris: Secondary | ICD-10-CM | POA: Diagnosis not present

## 2017-11-05 LAB — POC URINALSYSI DIPSTICK (AUTOMATED)
Bilirubin, UA: NEGATIVE
GLUCOSE UA: NEGATIVE
Ketones, UA: NEGATIVE
Leukocytes, UA: NEGATIVE
Nitrite, UA: NEGATIVE
PROTEIN UA: NEGATIVE
SPEC GRAV UA: 1.015 (ref 1.010–1.025)
UROBILINOGEN UA: 0.2 U/dL
pH, UA: 6 (ref 5.0–8.0)

## 2017-11-05 MED ORDER — METHYLPREDNISOLONE ACETATE 80 MG/ML IJ SUSP
80.0000 mg | Freq: Once | INTRAMUSCULAR | Status: AC
  Administered 2017-11-05: 80 mg via INTRAMUSCULAR

## 2017-11-05 MED ORDER — CEPHALEXIN 250 MG PO CAPS: 250.0000 mg | ORAL_CAPSULE | Freq: Two times a day (BID) | ORAL | 0 refills | Status: DC

## 2017-11-05 NOTE — Addendum Note (Signed)
Addended by: Roena Malady on: 11/05/2017 10:52 AM   Modules accepted: Orders

## 2017-11-05 NOTE — Progress Notes (Signed)
HPI  Pt presents to the clinic today with c/o urinary frequency. She reports this started 2-3 months but worse in the last 2-3 weeks. She denies fever, chills, nausea or low back pain. She has not tried anything OTC for this. She has a history of mixed incontinence but not taking any medications for this.  She also reports a rash. She noticed this 4 days ago. The rash is located all over her body. It does itchy. She denies changes in soaps, lotions or detergents. She denies changes in medication and diet. She has been working out in the yard. No one in her home has a similar rash.  Review of Systems  Past Medical History:  Diagnosis Date  . Arthritis   . CAD (coronary artery disease)    a. 07/2008 NSTEMI ->Cath: LM nl, LAD 70p/90p, 24m, LCX nl, RCA nl, EF 60%.  The LAD was stented with a 2.25x43mm Veri-Flex BMS.  b. 8/15 cath patent stent, nonobstructive disease otherwise - unchanged. Elevated troponin felt due to demand ischemia in setting of AF-RVR. c. Elevated trop 06/2014 felt due to demand ischemia.  . Chronic diastolic CHF (congestive heart failure) (HCC)    a. 07/2008 Echo: EF 60-65%, Triv AI/MR, Mild TR;  b. 09/2013 Echo: EF 60-65%, mild conc LVH.  [name] Difficult intubation   . Duodenitis   . GERD (gastroesophageal reflux disease)   . Headache(784.0)   . History of positive PPD   . Hyperlipidemia   . Hypertension   . Lactose intolerance   . Morbid obesity (HCC)   . Osteoporosis   . PAF (paroxysmal atrial fibrillation) (HCC)    a. s/p multiple cardioversions;  b. 2010: on Tikosyn - dose decreased from to in setting of NSTEMI/QT prolongation. Did not hold NSR. c. admitted 08/2011 with loading at BID with DCCV at that time;  d. Chronic coumadin (CHA2DS2VASc = 5). e. 06/2014: Joice Lofts stopped due to ineffective; started on amiodarone.  . Renal insufficiency    a. Baseline Cr appears 1.1-1.2.  . Rosacea     Family History  Problem Relation Age of Onset  . Other Mother         GI Bleed  . Pneumonia Father   . Coronary artery disease Brother   . Breast cancer Paternal Grandmother   . Rectal cancer Paternal Grandfather     Social History   Socioeconomic History  . Marital status: Widowed    Spouse name: Not on file  . Number of children: 2  . Years of education: Not on file  . Highest education level: Not on file  Occupational History  . Occupation: retired    Associate Professor: RETIRED  Social Needs  . Financial resource strain: Not on file  . Food insecurity:    Worry: Not on file    Inability: Not on file  . Transportation needs:    Medical: Not on file    Non-medical: Not on file  Tobacco Use  . Smoking status: Never Smoker  . Smokeless tobacco: Never Used  Substance and Sexual Activity  . Alcohol use: No    Alcohol/week: 0.0 standard drinks  . Drug use: No  . Sexual activity: Not Currently    Birth control/protection: Post-menopausal  Lifestyle  . Physical activity:    Days per week: Not on file    Minutes per session: Not on file  . Stress: Not on file  Relationships  . Social connections:    Talks on phone: Not on file  Gets together: Not on file    Attends religious service: Not on file    Active member of club or organization: Not on file    Attends meetings of clubs or organizations: Not on file    Relationship status: Not on file  . Intimate partner violence:    Fear of current or ex partner: Not on file    Emotionally abused: Not on file    Physically abused: Not on file    Forced sexual activity: Not on file  Other Topics Concern  . Not on file  Social History Narrative   Lives in [place], [place] w/ husband.    Allergies  Allergen Reactions  . Statins Other (See Comments)    Muscle and joint pain     Constitutional: Denies fever, malaise, fatigue, headache or abrupt weight changes.   GU: Pt reports urgency, frequency and pain with urination. Denies burning sensation, blood in urine, odor or discharge. Skin: Pt  reports rash. Denies ulcercations.   No other specific complaints in a complete review of systems (except as listed in HPI above).    Objective:   Physical Exam  BP 140/82   Pulse 94   Temp 98.4 F (36.9 C) (Oral)   Wt 282 lb (127.9 kg)   SpO2 97%   BMI 46.93 kg/m   Wt Readings from Last 3 Encounters:  06/04/17 285 lb (129.3 kg)  03/07/17 287 lb 3.2 oz (130.3 kg)  11/15/16 282 lb 6.4 oz (128.1 kg)    General: Appears her stated age, obese, in NAD. Abdomen: Soft. Normal bowel sounds. No distention or masses noted.  Tender to palpation over the bladder area. No CVA tenderness. Skin: Scattered, vesicular rash on erythematous base noted of bilateral upper and lower extremities, abdomen and back.        Assessment & Plan:   Urinary Frequency:  Urinalysis: 2+ leuks, trace blood Will send urine culture eRx sent if for Keflex 250 mg BID x 5 days OK to take AZO OTC Drink plenty of fluids  Contact Dermatitis due to Plant:  80 mg Depo IM today Use Hydrocortisone cream OTC  RTC as needed or if symptoms persist. [name] Reaper, NP

## 2017-11-05 NOTE — Patient Instructions (Signed)
Poison Ivy Dermatitis Poison ivy dermatitis is redness and soreness (inflammation) of the skin. It is caused by a chemical that is found on the leaves of the poison ivy plant. You may also have itching, a rash, and blisters. Symptoms often clear up in 1-2 weeks. You may get this condition by touching a poison ivy plant. You can also get it by touching something that has the chemical on it. This may include animals or objects that have come in contact with the plant. Follow these instructions at home: General instructions  Take or apply over-the-counter and prescription medicines only as told by your doctor.  If you touch poison ivy, wash your skin with soap and cold water right away.  Use hydrocortisone creams or calamine lotion as needed to help with itching.  Take oatmeal baths as needed. Use colloidal oatmeal. You can get this at a pharmacy or grocery store. Follow the instructions on the package.  Do not scratch or rub your skin.  While you have the rash, wash your clothes right after you wear them. Prevention  Know what poison ivy looks like so you can avoid it. This plant has three leaves with flowering branches on a single stem. The leaves are glossy. They have uneven edges that come to a point at the front.  If you have touched poison ivy, wash with soap and water right away. Be sure to wash under your fingernails.  When hiking or camping, wear long pants, a long-sleeved shirt, tall socks, and hiking boots. You can also use a lotion on your skin that helps to prevent contact with the chemical on the plant.  If you think that your clothes or outdoor gear came in contact with poison ivy, rinse them off with a garden hose before you bring them inside your house. Contact a doctor if:  You have open sores in the rash area.  You have more redness, swelling, or pain in the affected area.  You have redness that spreads beyond the rash area.  You have fluid, blood, or pus coming from  the affected area.  You have a fever.  You have a rash over a large area of your body.  You have a rash on your eyes, mouth, or genitals.  Your rash does not get better after a few days. Get help right away if:  Your face swells or your eyes swell shut.  You have trouble breathing.  You have trouble swallowing. This information is not intended to replace advice given to you by your health care provider. Make sure you discuss any questions you have with your health care provider. Document Released: 02/24/2010 Document Revised: 06/30/2015 Document Reviewed: 06/30/2014 Elsevier Interactive Patient Education  2018 Elsevier Inc.  

## 2017-11-06 LAB — URINE CULTURE
MICRO NUMBER: 91177827
SPECIMEN QUALITY: ADEQUATE

## 2017-11-08 ENCOUNTER — Other Ambulatory Visit: Payer: Self-pay | Admitting: Internal Medicine

## 2017-11-08 MED ORDER — PREDNISONE 10 MG PO TABS: ORAL_TABLET | ORAL | 0 refills | Status: DC

## 2017-11-12 ENCOUNTER — Ambulatory Visit (INDEPENDENT_AMBULATORY_CARE_PROVIDER_SITE_OTHER): Payer: Medicare Other | Admitting: *Deleted

## 2017-11-12 DIAGNOSIS — I4891 Unspecified atrial fibrillation: Secondary | ICD-10-CM | POA: Diagnosis not present

## 2017-11-12 DIAGNOSIS — Z7901 Long term (current) use of anticoagulants: Secondary | ICD-10-CM

## 2017-11-12 DIAGNOSIS — Z5181 Encounter for therapeutic drug level monitoring: Secondary | ICD-10-CM | POA: Diagnosis not present

## 2017-11-12 DIAGNOSIS — I4819 Other persistent atrial fibrillation: Secondary | ICD-10-CM | POA: Diagnosis not present

## 2017-11-12 LAB — POCT INR: INR: 2.7 (ref 2.0–3.0)

## 2017-11-12 NOTE — Patient Instructions (Signed)
Description    Continue on same dosage 1/2 tablet daily except 1 tablet on Tuesdays, Thursdays, and Saturdays.  Recheck INR in 6 weeks. Call with any questions: [phone]    

## 2017-11-15 ENCOUNTER — Ambulatory Visit: Payer: Medicare Other | Admitting: Adult Health

## 2017-12-03 ENCOUNTER — Other Ambulatory Visit: Payer: Self-pay | Admitting: Primary Care

## 2017-12-03 DIAGNOSIS — L299 Pruritus, unspecified: Secondary | ICD-10-CM

## 2017-12-03 NOTE — Telephone Encounter (Signed)
Electronic refill request Hydroxyzine Last refill 09/03/17 #90 Last office visit 11/05/17 acute

## 2017-12-04 NOTE — Telephone Encounter (Signed)
How often is she taking the hydroxyzine? Does she actually need a refill?

## 2017-12-05 NOTE — Telephone Encounter (Signed)
Spoken to patient. She stated that she still have some. She is taking 1 tablet daily at bedtime for sleep and the itchy. She stated if Jae Dire wants to send a refill now that is fine. However, she is okay on waiting until she done with the bottle as well. She stated that it is up to [employer].

## 2017-12-05 NOTE — Telephone Encounter (Signed)
Noted, refill sent to pharmacy. 

## 2017-12-24 ENCOUNTER — Ambulatory Visit (INDEPENDENT_AMBULATORY_CARE_PROVIDER_SITE_OTHER): Payer: Medicare Other | Admitting: Pharmacist

## 2017-12-24 DIAGNOSIS — I4891 Unspecified atrial fibrillation: Secondary | ICD-10-CM

## 2017-12-24 DIAGNOSIS — I4819 Other persistent atrial fibrillation: Secondary | ICD-10-CM | POA: Diagnosis not present

## 2017-12-24 DIAGNOSIS — Z7901 Long term (current) use of anticoagulants: Secondary | ICD-10-CM | POA: Diagnosis not present

## 2017-12-24 DIAGNOSIS — Z5181 Encounter for therapeutic drug level monitoring: Secondary | ICD-10-CM

## 2017-12-24 LAB — POCT INR: INR: 1.9 — AB (ref 2.0–3.0)

## 2017-12-24 NOTE — Patient Instructions (Signed)
Description   Take 1.5 tablets today then continue on same dosage 1/2 tablet daily except 1 tablet on Tuesdays, Thursdays, and Saturdays.  Recheck INR in 4 weeks. Call with any questions: [phone]

## 2018-01-01 ENCOUNTER — Other Ambulatory Visit: Payer: Self-pay | Admitting: Internal Medicine

## 2018-01-01 DIAGNOSIS — Z5181 Encounter for therapeutic drug level monitoring: Secondary | ICD-10-CM

## 2018-01-01 DIAGNOSIS — Z7901 Long term (current) use of anticoagulants: Secondary | ICD-10-CM

## 2018-01-01 DIAGNOSIS — I4891 Unspecified atrial fibrillation: Secondary | ICD-10-CM

## 2018-01-01 DIAGNOSIS — I4819 Other persistent atrial fibrillation: Secondary | ICD-10-CM

## 2018-01-24 ENCOUNTER — Ambulatory Visit (INDEPENDENT_AMBULATORY_CARE_PROVIDER_SITE_OTHER): Payer: Medicare Other

## 2018-01-24 DIAGNOSIS — Z7901 Long term (current) use of anticoagulants: Secondary | ICD-10-CM | POA: Diagnosis not present

## 2018-01-24 DIAGNOSIS — I4891 Unspecified atrial fibrillation: Secondary | ICD-10-CM | POA: Diagnosis not present

## 2018-01-24 DIAGNOSIS — Z5181 Encounter for therapeutic drug level monitoring: Secondary | ICD-10-CM

## 2018-01-24 DIAGNOSIS — I4819 Other persistent atrial fibrillation: Secondary | ICD-10-CM | POA: Diagnosis not present

## 2018-01-24 LAB — POCT INR: INR: 2.3 (ref 2.0–3.0)

## 2018-01-24 NOTE — Patient Instructions (Signed)
Description   Continue on same dosage 1/2 tablet daily except 1 tablet on Tuesdays, Thursdays, and Saturdays.  Recheck INR in 4 weeks. Call with any questions: [phone]

## 2018-01-27 IMAGING — DX DG CHEST 2V
2 series · 2 of 2 positions shown · non-contrast
Comparison: 01/07/2015

CLINICAL DATA: Cough, shortness of breath, wheezing

EXAM:
CHEST  2 VIEW

[chest pa]
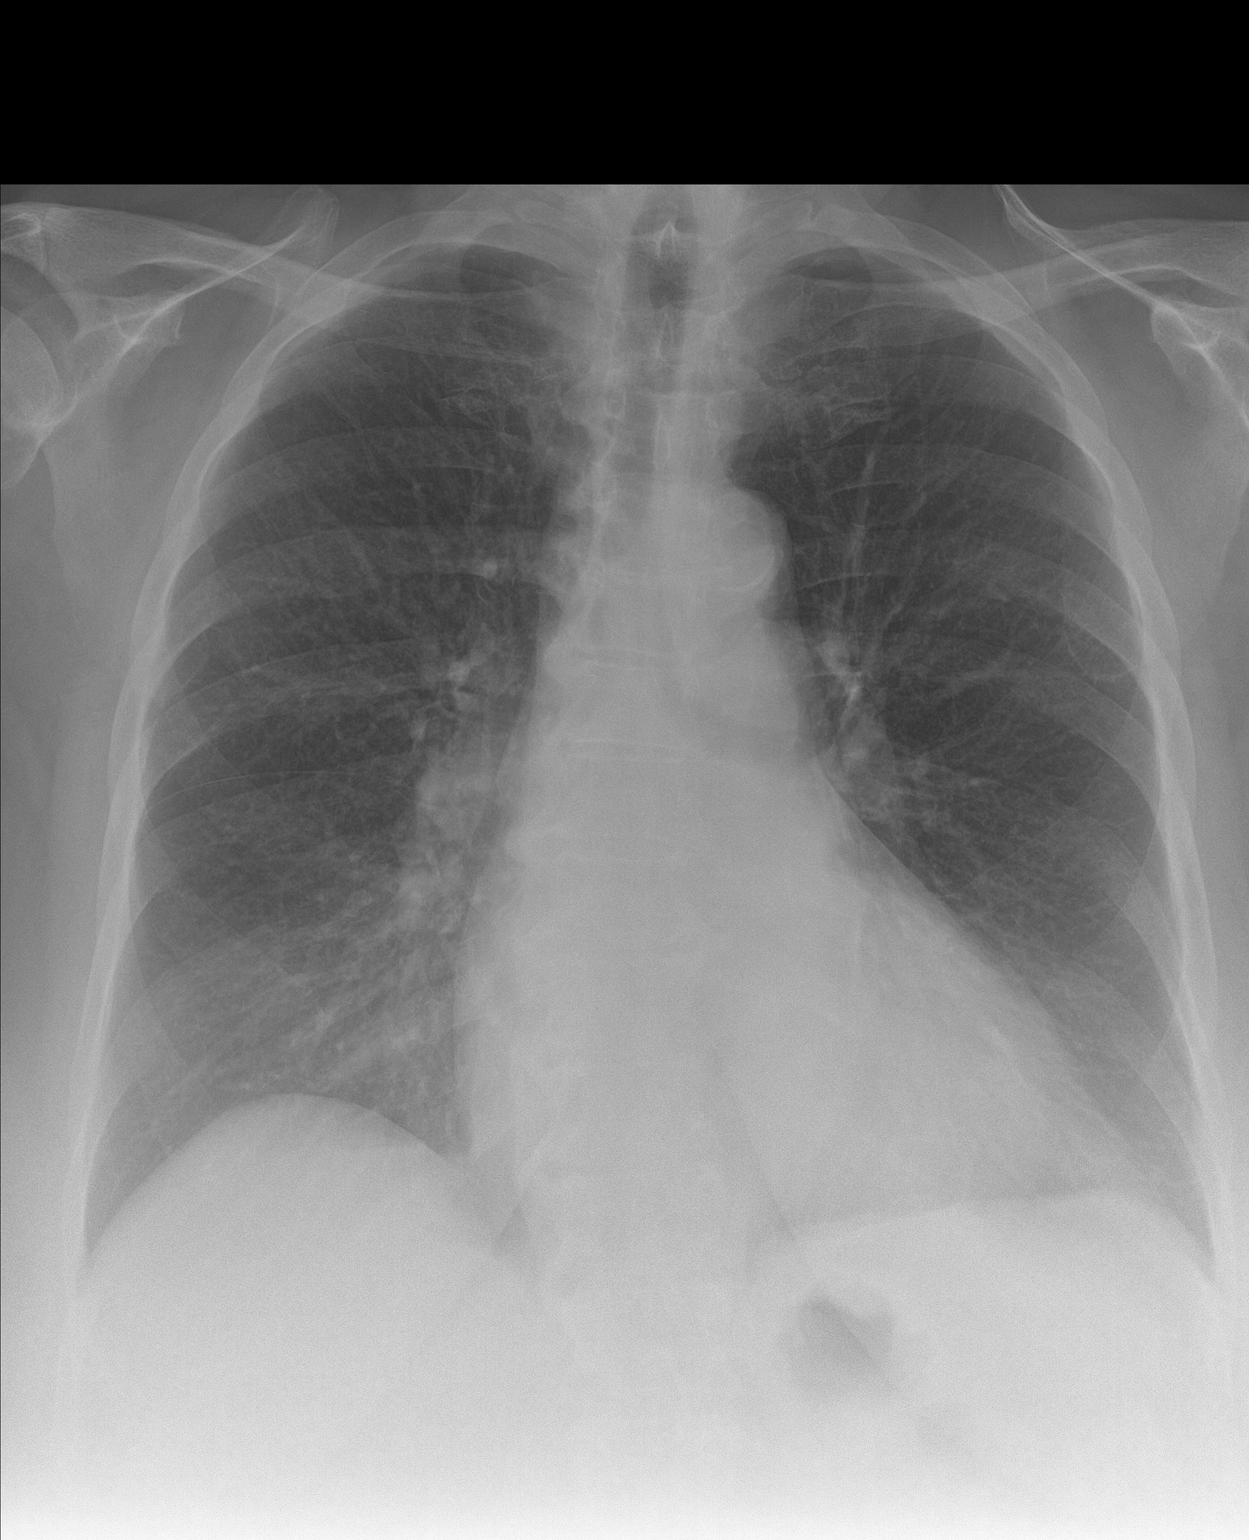

[chest lat]
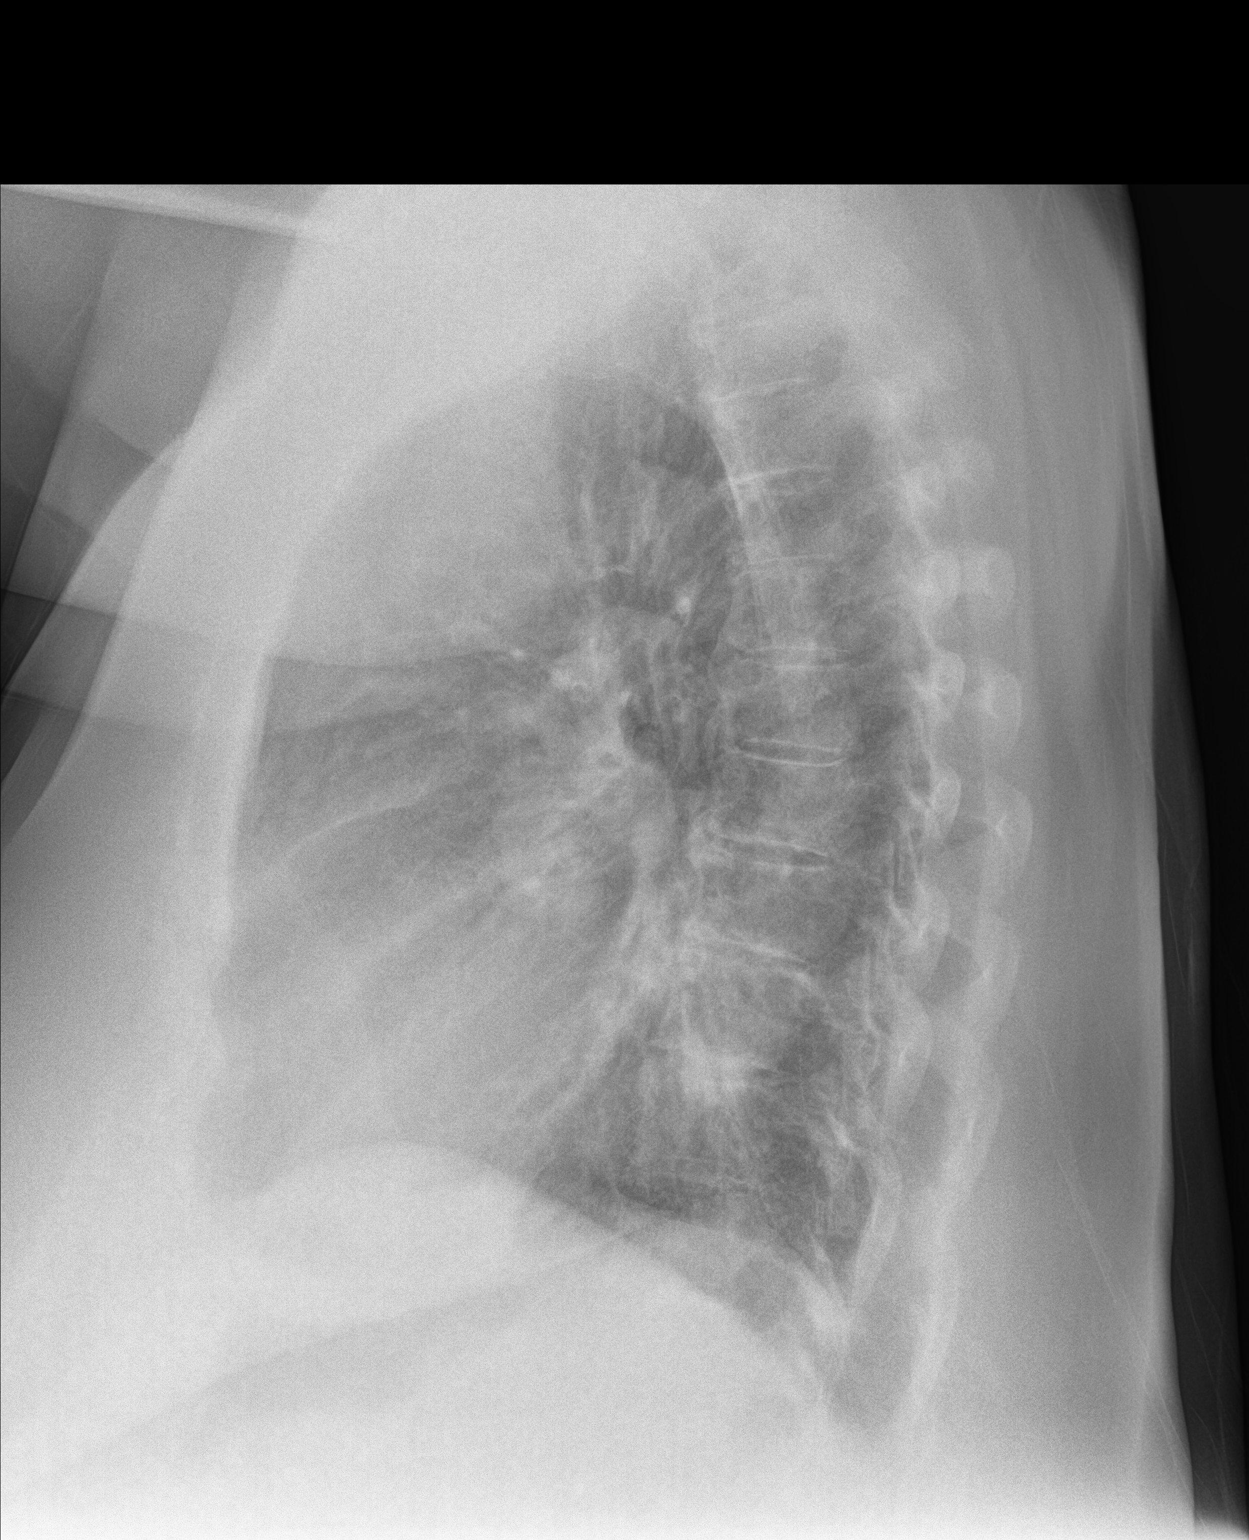

[2 of 2 positions shown; findings below may reference images not displayed]

FINDINGS: Mild cardiomegaly. Lungs are clear. No effusions or acute bony
abnormality.
IMPRESSION: Cardiomegaly.  No active disease.

## 2018-02-21 ENCOUNTER — Ambulatory Visit (INDEPENDENT_AMBULATORY_CARE_PROVIDER_SITE_OTHER): Payer: Medicare Other | Admitting: Pharmacist

## 2018-02-21 DIAGNOSIS — Z5181 Encounter for therapeutic drug level monitoring: Secondary | ICD-10-CM | POA: Diagnosis not present

## 2018-02-21 DIAGNOSIS — I4891 Unspecified atrial fibrillation: Secondary | ICD-10-CM

## 2018-02-21 DIAGNOSIS — I4819 Other persistent atrial fibrillation: Secondary | ICD-10-CM

## 2018-02-21 DIAGNOSIS — Z7901 Long term (current) use of anticoagulants: Secondary | ICD-10-CM | POA: Diagnosis not present

## 2018-02-21 LAB — POCT INR: INR: 2.4 (ref 2.0–3.0)

## 2018-02-21 NOTE — Patient Instructions (Signed)
Continue on same dosage 1/2 tablet daily except 1 tablet on Tuesdays, Thursdays, and Saturdays.  Recheck INR in 4 weeks. Call with any questions: [phone]

## 2018-04-04 ENCOUNTER — Ambulatory Visit (INDEPENDENT_AMBULATORY_CARE_PROVIDER_SITE_OTHER): Payer: Medicare Other

## 2018-04-04 DIAGNOSIS — Z5181 Encounter for therapeutic drug level monitoring: Secondary | ICD-10-CM

## 2018-04-04 DIAGNOSIS — I4819 Other persistent atrial fibrillation: Secondary | ICD-10-CM

## 2018-04-04 LAB — POCT INR: INR: 2.8 (ref 2.0–3.0)

## 2018-04-04 NOTE — Patient Instructions (Signed)
Description    Continue on same dosage 1/2 tablet daily except 1 tablet on Tuesdays, Thursdays, and Saturdays.  Recheck INR in 6 weeks. Call with any questions: [phone]    

## 2018-04-09 ENCOUNTER — Other Ambulatory Visit: Payer: Self-pay | Admitting: *Deleted

## 2018-04-09 MED ORDER — METOPROLOL TARTRATE 50 MG PO TABS: 50.0000 mg | ORAL_TABLET | Freq: Two times a day (BID) | ORAL | 3 refills | Status: DC

## 2018-04-09 MED ORDER — FUROSEMIDE 40 MG PO TABS: 40.0000 mg | ORAL_TABLET | Freq: Every day | ORAL | 3 refills | Status: DC

## 2018-05-02 ENCOUNTER — Telehealth: Payer: Self-pay | Admitting: Cardiology

## 2018-05-02 NOTE — Telephone Encounter (Signed)
New Message    Patient states someone called her and she's returning your call.

## 2018-05-05 NOTE — Telephone Encounter (Signed)
Pt cancel her appt until further notice

## 2018-05-08 ENCOUNTER — Ambulatory Visit: Payer: Medicare Other | Admitting: Cardiology

## 2018-05-09 ENCOUNTER — Ambulatory Visit: Payer: Medicare Other | Admitting: Cardiology

## 2018-05-15 ENCOUNTER — Telehealth: Payer: Self-pay

## 2018-05-15 NOTE — Telephone Encounter (Signed)

## 2018-05-16 ENCOUNTER — Other Ambulatory Visit: Payer: Self-pay

## 2018-05-16 ENCOUNTER — Ambulatory Visit (INDEPENDENT_AMBULATORY_CARE_PROVIDER_SITE_OTHER): Payer: Medicare Other | Admitting: Pharmacist Clinician (PhC)/ Clinical Pharmacy Specialist

## 2018-05-16 DIAGNOSIS — Z5181 Encounter for therapeutic drug level monitoring: Secondary | ICD-10-CM | POA: Diagnosis not present

## 2018-05-16 DIAGNOSIS — I4891 Unspecified atrial fibrillation: Secondary | ICD-10-CM | POA: Diagnosis not present

## 2018-05-16 DIAGNOSIS — Z7901 Long term (current) use of anticoagulants: Secondary | ICD-10-CM | POA: Diagnosis not present

## 2018-05-16 DIAGNOSIS — I4819 Other persistent atrial fibrillation: Secondary | ICD-10-CM

## 2018-05-16 LAB — POCT INR: INR: 2.3 (ref 2.0–3.0)

## 2018-07-08 ENCOUNTER — Telehealth: Payer: Self-pay

## 2018-07-08 NOTE — Telephone Encounter (Signed)

## 2018-07-11 ENCOUNTER — Ambulatory Visit (INDEPENDENT_AMBULATORY_CARE_PROVIDER_SITE_OTHER): Payer: Medicare Other | Admitting: *Deleted

## 2018-07-11 ENCOUNTER — Other Ambulatory Visit: Payer: Self-pay

## 2018-07-11 DIAGNOSIS — I4891 Unspecified atrial fibrillation: Secondary | ICD-10-CM | POA: Diagnosis not present

## 2018-07-11 DIAGNOSIS — Z5181 Encounter for therapeutic drug level monitoring: Secondary | ICD-10-CM

## 2018-07-11 DIAGNOSIS — Z7901 Long term (current) use of anticoagulants: Secondary | ICD-10-CM | POA: Diagnosis not present

## 2018-07-11 DIAGNOSIS — I4819 Other persistent atrial fibrillation: Secondary | ICD-10-CM | POA: Diagnosis not present

## 2018-07-11 LAB — POCT INR: INR: 1.9 — AB (ref 2.0–3.0)

## 2018-07-11 NOTE — Patient Instructions (Signed)
Description   Take 1 tablet today, then continue on same dosage 1/2 tablet daily except 1 tablet on Tuesdays, Thursdays, and Saturdays.  Recheck INR in 3 weeks. Call with any questions: [phone]

## 2018-07-17 DIAGNOSIS — H524 Presbyopia: Secondary | ICD-10-CM | POA: Diagnosis not present

## 2018-07-17 DIAGNOSIS — H40011 Open angle with borderline findings, low risk, right eye: Secondary | ICD-10-CM | POA: Diagnosis not present

## 2018-07-17 DIAGNOSIS — H2589 Other age-related cataract: Secondary | ICD-10-CM | POA: Diagnosis not present

## 2018-07-17 DIAGNOSIS — Z961 Presence of intraocular lens: Secondary | ICD-10-CM | POA: Diagnosis not present

## 2018-07-17 DIAGNOSIS — H26491 Other secondary cataract, right eye: Secondary | ICD-10-CM | POA: Diagnosis not present

## 2018-07-28 ENCOUNTER — Telehealth: Payer: Self-pay

## 2018-07-28 NOTE — Telephone Encounter (Signed)

## 2018-08-01 ENCOUNTER — Ambulatory Visit (INDEPENDENT_AMBULATORY_CARE_PROVIDER_SITE_OTHER): Payer: Medicare Other | Admitting: Pharmacist

## 2018-08-01 ENCOUNTER — Other Ambulatory Visit: Payer: Self-pay

## 2018-08-01 DIAGNOSIS — Z7901 Long term (current) use of anticoagulants: Secondary | ICD-10-CM | POA: Diagnosis not present

## 2018-08-01 DIAGNOSIS — Z5181 Encounter for therapeutic drug level monitoring: Secondary | ICD-10-CM

## 2018-08-01 DIAGNOSIS — I4819 Other persistent atrial fibrillation: Secondary | ICD-10-CM | POA: Diagnosis not present

## 2018-08-01 DIAGNOSIS — I4891 Unspecified atrial fibrillation: Secondary | ICD-10-CM

## 2018-08-01 LAB — POCT INR: INR: 2.2 (ref 2.0–3.0)

## 2018-08-01 MED ORDER — WARFARIN SODIUM 2.5 MG PO TABS: ORAL_TABLET | ORAL | 1 refills | Status: DC

## 2018-08-01 NOTE — Patient Instructions (Signed)
Description   Continue on same dosage 1/2 tablet daily except 1 tablet on Tuesdays, Thursdays, and Saturdays.  Recheck INR in 6 weeks. Call with any questions: [phone]

## 2018-09-12 ENCOUNTER — Ambulatory Visit (INDEPENDENT_AMBULATORY_CARE_PROVIDER_SITE_OTHER): Payer: Medicare Other | Admitting: Pharmacist

## 2018-09-12 ENCOUNTER — Other Ambulatory Visit: Payer: Self-pay

## 2018-09-12 DIAGNOSIS — I4891 Unspecified atrial fibrillation: Secondary | ICD-10-CM

## 2018-09-12 DIAGNOSIS — Z7901 Long term (current) use of anticoagulants: Secondary | ICD-10-CM | POA: Diagnosis not present

## 2018-09-12 DIAGNOSIS — Z5181 Encounter for therapeutic drug level monitoring: Secondary | ICD-10-CM | POA: Diagnosis not present

## 2018-09-12 DIAGNOSIS — I4819 Other persistent atrial fibrillation: Secondary | ICD-10-CM | POA: Diagnosis not present

## 2018-09-12 LAB — POCT INR: INR: 1.9 — AB (ref 2.0–3.0)

## 2018-09-12 NOTE — Patient Instructions (Signed)
Description   Take 1 tablet today, then continue on same dosage 1/2 tablet daily except 1 tablet on Tuesdays, Thursdays, and Saturdays.  Recheck INR in 5 weeks. Call with any questions: [phone]

## 2018-09-21 NOTE — Progress Notes (Signed)
Cardiology Office Note   Date:  09/22/2018   ID:  [name], DOB 07-Jul-1945, MRN [id number]  PCP:  Pleas Koch, NP  Cardiologist:   Minus Breeding, MD   Chief Complaint  Patient presents with  . Shortness of Breath      History of Present Illness: [name] is a 73 y.o. female who presents for follow up of atrial fib.   She has been seen in the [facility].   She was placed on amiodarone for rate control by Dr. Rayann Heman in 2016. She has failed amiodarone/tikosyn/cardioversions in the past.  This was eventually discontinued as she had reasonable rate control without the amio.    Since I last saw her she has done OK. She has chronic dyspnea with exertion.  However, this has not changed since last year.  She is sedentary.   She lives alone and does get the groceries.  With this she has chronic DOE.  The patient denies any new symptoms such as chest discomfort, neck or arm discomfort. There has been no new PND or orthopnea. There have been no reported palpitations, presyncope or syncope.   She sleeps in a chair and does not use her CPAP.   Past Medical History:  Diagnosis Date  . Arthritis   . CAD (coronary artery disease)    a. 07/2008 NSTEMI ->Cath: LM nl, LAD 70p/90p, 59m, LCX nl, RCA nl, EF 60%.  The LAD was stented with a 2.25x78mm Veri-Flex BMS.  b. 8/15 cath patent stent, nonobstructive disease otherwise - unchanged. Elevated troponin felt due to demand ischemia in setting of AF-RVR. c. Elevated trop 06/2014 felt due to demand ischemia.  . Chronic diastolic CHF (congestive heart failure) ([place])    a. 07/2008 Echo: EF 60-65%, Triv AI/MR, Mild TR;  b. 09/2013 Echo: EF 60-65%, mild conc LVH.  [name] Difficult intubation   . Duodenitis   . GERD (gastroesophageal reflux disease)   . Headache(784.0)   . History of positive PPD   . Hyperlipidemia   . Hypertension   . Lactose intolerance   . Morbid obesity ([place])   . Osteoporosis   . PAF (paroxysmal atrial fibrillation)  ([place])    a. s/p multiple cardioversions;  b. 2010: on Tikosyn - dose decreased from 569mcg to 242mcg in setting of NSTEMI/QT prolongation. Did not hold NSR. c. admitted 08/2011 with loading at 531mcg BID with DCCV at that time;  d. Chronic coumadin (CHA2DS2VASc = 5). e. 06/2014: [name] stopped due to ineffective; started on amiodarone.  . Renal insufficiency    a. Baseline Cr appears 1.1-1.2.  . Rosacea     Past Surgical History:  Procedure Laterality Date  . ABDOMINAL HYSTERECTOMY     partial, bleeding  . CHOLECYSTECTOMY    . CORONARY ANGIOPLASTY WITH STENT PLACEMENT  2010   2.75- x 20-mm VeriFlex DES to the pLAD  . CORONARY STENT PLACEMENT    . ESOPHAGOGASTRODUODENOSCOPY  2003  . KNEE ARTHROSCOPY  2000 & 2001   left and right  . LEFT HEART CATHETERIZATION WITH CORONARY ANGIOGRAM N/A 09/24/2013   Procedure: LEFT HEART CATHETERIZATION WITH CORONARY ANGIOGRAM;  Surgeon: Blane Ohara, MD;  [facility] [place], LAD stent patent, mLAD 50-60%, CFX [place], RCA 30-40%, EF 70%  . TOTAL KNEE ARTHROPLASTY     bilateral  . TUBAL LIGATION       Current Outpatient Medications  Medication Sig Dispense Refill  . acetaminophen (TYLENOL) 500 MG tablet Take 1,000 mg by mouth  every 6 (six) hours as needed for headache.    . cephALEXin (KEFLEX) 250 MG capsule Take 1 capsule (250 mg total) by mouth 2 (two) times daily. 10 capsule 0  . furosemide (LASIX) 40 MG tablet Take 1 tablet (40 mg total) by mouth daily. 90 tablet 3  . hydrOXYzine (ATARAX/VISTARIL) 10 MG tablet TAKE 1 TABLET (10 MG TOTAL) BY MOUTH AT BEDTIME AS NEEDED FOR ITCHING. 90 tablet 0  . magnesium oxide (MAG-OX) 400 MG tablet Take 1 tablet (400 mg total) by mouth daily. 90 tablet 2  . metoprolol tartrate (LOPRESSOR) 50 MG tablet TAKE 1 AND 1/2 TABLETS TWICE A DAY 270 tablet 3  . warfarin (COUMADIN) 2.5 MG tablet Take 1/2 to 1 tablet daily as directed by Coumadin clinic 75 tablet 1   No current facility-administered medications for this visit.      Allergies:   Statins    ROS:  Please see the history of present illness.   Otherwise, review of systems are positive for itching and hand numbness at times.   All other systems are reviewed and negative.    PHYSICAL EXAM: VS:  BP (!) 159/83   Pulse 97   Temp (!) 97.2 F (36.2 C)   Wt 279 lb 12.8 oz (126.9 kg)   SpO2 97%   BMI 46.56 kg/m  , BMI Body mass index is 46.56 kg/m. GENERAL:  Well appearing NECK:  No jugular venous distention, waveform within normal limits, carotid upstroke brisk and symmetric, no bruits, no thyromegaly LUNGS:  Clear to auscultation bilaterally CHEST:  Unremarkable HEART:  PMI not displaced or sustained,S1 and S2 within normal limits, no S3, no clicks, no rubs, no murmurs, irregular  ABD:  Flat, positive bowel sounds normal in frequency in pitch, no bruits, no rebound, no guarding, no midline pulsatile mass, no hepatomegaly, no splenomegaly EXT:  2 plus pulses throughout, no edema, no cyanosis no clubbing  EKG:  EKG is  ordered today. Atrial fib with rate 97, LVH with repolarization with inferior and lateral T wave inversion.  Poor anterior R wave progression.   Recent Labs: No results found for requested labs within last 8760 hours.    Lipid Panel    Component Value Date/Time   CHOL 189 11/02/2015 1242   TRIG 121.0 11/02/2015 1242   HDL 48.40 11/02/2015 1242   CHOLHDL 4 11/02/2015 1242   VLDL 24.2 11/02/2015 1242   LDLCALC 116 (H) 11/02/2015 1242      Wt Readings from Last 3 Encounters:  09/22/18 279 lb 12.8 oz (126.9 kg)  11/05/17 282 lb (127.9 kg)  06/04/17 285 lb (129.3 kg)      Other studies Reviewed: Additional studies/ records that were reviewed today include: None. Review of the above records demonstrates:  Please see elsewhere in the note.     ASSESSMENT AND PLAN:  CHRONIC ATRIAL FIB:  [name] has a CHA2DS2 - VASc score of 4.    She tolerates anticoagulation.  I am going to increase the beta blocker to 75 mg bid in  part for better HR control.    SLEEP APNEA:  Not using CPAP.   She should review this with the prescribing MD but I don;t think she is ever going to use this.   OBESITY:    We have talked about diet and weight loss at length in the past.   CAD: The patient has no new sypmtoms.  No further cardiovascular testing is indicated.  We will continue  with aggressive risk reduction and meds as listed.  DYSPNEA:    I will check a BNP.  I suspect that this is secondary to obesity.  She seems to be euvolemic.   HTN:  BP is elevated.  I will address with increased beta blocker.    Current medicines are reviewed at length with the patient today.  The patient does not have concerns regarding medicines.  The following changes have been made:  As above  Labs/ tests ordered today include:   Orders Placed This Encounter  Procedures  . CBC with Differential/Platelet  . TSH  . Comprehensive metabolic panel  . Pro b natriuretic peptide (BNP)[id number])  . EKG 12-Lead     Disposition:   FU with me in one year.      Signed, Rollene RotundaJames Chaska Hagger, MD  09/22/2018 11:26 AM    [facility]

## 2018-09-22 ENCOUNTER — Encounter: Payer: Self-pay | Admitting: Cardiology

## 2018-09-22 ENCOUNTER — Other Ambulatory Visit: Payer: Self-pay

## 2018-09-22 ENCOUNTER — Ambulatory Visit (INDEPENDENT_AMBULATORY_CARE_PROVIDER_SITE_OTHER): Payer: Medicare Other | Admitting: Cardiology

## 2018-09-22 VITALS — BP 159/83 | HR 97 | Temp 97.2°F | Wt 279.8 lb

## 2018-09-22 DIAGNOSIS — I10 Essential (primary) hypertension: Secondary | ICD-10-CM

## 2018-09-22 DIAGNOSIS — R0600 Dyspnea, unspecified: Secondary | ICD-10-CM

## 2018-09-22 DIAGNOSIS — Z5181 Encounter for therapeutic drug level monitoring: Secondary | ICD-10-CM

## 2018-09-22 DIAGNOSIS — I482 Chronic atrial fibrillation, unspecified: Secondary | ICD-10-CM

## 2018-09-22 DIAGNOSIS — R0602 Shortness of breath: Secondary | ICD-10-CM | POA: Diagnosis not present

## 2018-09-22 MED ORDER — METOPROLOL TARTRATE 50 MG PO TABS: ORAL_TABLET | ORAL | 3 refills | Status: DC

## 2018-09-22 NOTE — Patient Instructions (Signed)
Medication Instructions:  INCREASE METOPROLOL TO 75 MG (1 AND 1/2 TABLETS) TWICE A DAY   If you need a refill on your cardiac medications before your next appointment, please call your pharmacy.   Lab work: CMET/CBC/BNP/TSH TODAY   If you have labs (blood work) drawn today and your tests are completely normal, you will receive your results only by: [name] MyChart Message (if you have MyChart) OR . A paper copy in the mail If you have any lab test that is abnormal or we need to change your treatment, we will call you to review the results.  Testing/Procedures: NONE  Follow-Up: At [facility], you and your health needs are our priority.  As part of our continuing mission to provide you with exceptional heart care, we have created designated Provider Care Teams.  These Care Teams include your primary Cardiologist (physician) and Advanced Practice Providers (APPs -  Physician Assistants and Nurse Practitioners) who all work together to provide you with the care you need, when you need it. You will need a follow up appointment in 12 months.  Please call our office 2 months in advance to schedule this appointment.  You may see Minus Breeding, MD or one of the following Advanced Practice Providers on your designated Care Team:   Rosaria Ferries, PA-C . Jory Sims, DNP, ANP

## 2018-09-24 LAB — COMPREHENSIVE METABOLIC PANEL
ALT: 23 IU/L (ref 0–32)
AST: 35 IU/L (ref 0–40)
Albumin/Globulin Ratio: 1.8 (ref 1.2–2.2)
Albumin: 4.4 g/dL (ref 3.7–4.7)
Alkaline Phosphatase: 79 IU/L (ref 39–117)
BUN/Creatinine Ratio: 15 (ref 12–28)
BUN: 20 mg/dL (ref 8–27)
Bilirubin Total: 0.5 mg/dL (ref 0.0–1.2)
CO2: 24 mmol/L (ref 20–29)
Calcium: 9.9 mg/dL (ref 8.7–10.3)
Chloride: 99 mmol/L (ref 96–106)
Creatinine, Ser: 1.34 mg/dL — ABNORMAL HIGH (ref 0.57–1.00)
GFR calc Af Amer: 45 mL/min/{1.73_m2} — ABNORMAL LOW (ref >59)
GFR calc non Af Amer: 39 mL/min/{1.73_m2} — ABNORMAL LOW (ref >59)
Globulin, Total: 2.5 g/dL (ref 1.5–4.5)
Glucose: 96 mg/dL (ref 65–99)
Potassium: 4.4 mmol/L (ref 3.5–5.2)
Sodium: 140 mmol/L (ref 134–144)
Total Protein: 6.9 g/dL (ref 6.0–8.5)

## 2018-09-24 LAB — CBC WITH DIFFERENTIAL/PLATELET
Basophils Absolute: 0.1 10*3/uL (ref 0.0–0.2)
Basos: 1 %
EOS (ABSOLUTE): 0.4 10*3/uL (ref 0.0–0.4)
Eos: 5 %
Hematocrit: 46.3 % (ref 34.0–46.6)
Hemoglobin: 15.3 g/dL (ref 11.1–15.9)
Immature Grans (Abs): 0 10*3/uL (ref 0.0–0.1)
Immature Granulocytes: 0 %
Lymphocytes Absolute: 1.3 10*3/uL (ref 0.7–3.1)
Lymphs: 18 %
MCH: 27.7 pg (ref 26.6–33.0)
MCHC: 33 g/dL (ref 31.5–35.7)
MCV: 84 fL (ref 79–97)
Monocytes Absolute: 0.6 10*3/uL (ref 0.1–0.9)
Monocytes: 9 %
Neutrophils Absolute: 4.7 10*3/uL (ref 1.4–7.0)
Neutrophils: 67 %
Platelets: 260 10*3/uL (ref 150–450)
RBC: 5.53 x10E6/uL — ABNORMAL HIGH (ref 3.77–5.28)
RDW: 12.6 % (ref 11.7–15.4)
WBC: 7 10*3/uL (ref 3.4–10.8)

## 2018-09-24 LAB — PRO B NATRIURETIC PEPTIDE: NT-Pro BNP: 1494 pg/mL — ABNORMAL HIGH (ref 0–738)

## 2018-09-24 LAB — TSH: TSH: 5.63 u[IU]/mL — ABNORMAL HIGH (ref 0.450–4.500)

## 2018-09-30 ENCOUNTER — Telehealth: Payer: Self-pay | Admitting: *Deleted

## 2018-09-30 DIAGNOSIS — I482 Chronic atrial fibrillation, unspecified: Secondary | ICD-10-CM

## 2018-09-30 DIAGNOSIS — R0602 Shortness of breath: Secondary | ICD-10-CM

## 2018-09-30 DIAGNOSIS — R7989 Other specified abnormal findings of blood chemistry: Secondary | ICD-10-CM

## 2018-09-30 DIAGNOSIS — Z5181 Encounter for therapeutic drug level monitoring: Secondary | ICD-10-CM

## 2018-09-30 MED ORDER — FUROSEMIDE 40 MG PO TABS: ORAL_TABLET | ORAL | 3 refills | Status: DC

## 2018-09-30 NOTE — Telephone Encounter (Signed)
-----   Message from Minus Breeding, MD sent at 09/23/2018  7:48 AM EDT ----- Creat is mildly elevated.  TSH is elevated.  Please check a T3 and T4.  Call Ms. Penafiel with the results and send results to Pleas Koch, NP

## 2018-09-30 NOTE — Telephone Encounter (Signed)
-----   Message from Minus Breeding, MD sent at 09/29/2018 11:26 AM EDT ----- BNP is elevated.  Have her reduce salt and reduce fluid to 48 ounces per day.  She can increase Lasix to 60 mg daily and check a BMET in one month.  Call Ms. Wisnewski with the results and send results to Pleas Koch, NP

## 2018-09-30 NOTE — Telephone Encounter (Signed)
Advised patient of lab results and orders placed

## 2018-10-08 DIAGNOSIS — R7989 Other specified abnormal findings of blood chemistry: Secondary | ICD-10-CM | POA: Diagnosis not present

## 2018-10-08 DIAGNOSIS — R0600 Dyspnea, unspecified: Secondary | ICD-10-CM | POA: Diagnosis not present

## 2018-10-08 DIAGNOSIS — Z5181 Encounter for therapeutic drug level monitoring: Secondary | ICD-10-CM | POA: Diagnosis not present

## 2018-10-08 DIAGNOSIS — I4819 Other persistent atrial fibrillation: Secondary | ICD-10-CM | POA: Diagnosis not present

## 2018-10-08 LAB — T3: T3, Total: 100 ng/dL (ref 71–180)

## 2018-10-08 LAB — BASIC METABOLIC PANEL
BUN/Creatinine Ratio: 15 (ref 12–28)
BUN: 20 mg/dL (ref 8–27)
CO2: 26 mmol/L (ref 20–29)
Calcium: 9.9 mg/dL (ref 8.7–10.3)
Chloride: 98 mmol/L (ref 96–106)
Creatinine, Ser: 1.37 mg/dL — ABNORMAL HIGH (ref 0.57–1.00)
GFR calc Af Amer: 44 mL/min/{1.73_m2} — ABNORMAL LOW (ref >59)
GFR calc non Af Amer: 38 mL/min/{1.73_m2} — ABNORMAL LOW (ref >59)
Glucose: 103 mg/dL — ABNORMAL HIGH (ref 65–99)
Potassium: 4.2 mmol/L (ref 3.5–5.2)
Sodium: 140 mmol/L (ref 134–144)

## 2018-10-08 LAB — T4: T4, Total: 7.9 ug/dL (ref 4.5–12.0)

## 2018-10-16 ENCOUNTER — Other Ambulatory Visit: Payer: Self-pay

## 2018-10-16 ENCOUNTER — Ambulatory Visit (INDEPENDENT_AMBULATORY_CARE_PROVIDER_SITE_OTHER): Payer: Medicare Other | Admitting: Internal Medicine

## 2018-10-16 ENCOUNTER — Ambulatory Visit: Payer: Medicare Other | Admitting: Family Medicine

## 2018-10-16 ENCOUNTER — Encounter: Payer: Self-pay | Admitting: Internal Medicine

## 2018-10-16 VITALS — BP 142/70 | HR 100 | Temp 97.5°F | Wt 275.0 lb

## 2018-10-16 DIAGNOSIS — K5900 Constipation, unspecified: Secondary | ICD-10-CM

## 2018-10-16 DIAGNOSIS — Z23 Encounter for immunization: Secondary | ICD-10-CM | POA: Diagnosis not present

## 2018-10-16 NOTE — Progress Notes (Signed)
Subjective:    Patient ID: [name], female    DOB: 1946-01-08, 73 y.o.   MRN: [id number]  HPI  Pt presents to the clinic today wit hc/o nausea and constipation. She reports she has not had a BM in 2 weeks, but had one this morning. She was having some lower abdominal discomfort which has resolved after the bowel movement. She did notices some blood in her stool after using an enema. She denies rectal pain, itching. She denies vomiting. She has tried Dulcolax OTC as needed with minimal relief.   Review of Systems      Past Medical History:  Diagnosis Date  . Arthritis   . CAD (coronary artery disease)    a. 07/2008 NSTEMI ->Cath: LM nl, LAD 70p/90p, 69m, LCX nl, RCA nl, EF 60%.  The LAD was stented with a 2.25x74mm Veri-Flex BMS.  b. 8/15 cath patent stent, nonobstructive disease otherwise - unchanged. Elevated troponin felt due to demand ischemia in setting of AF-RVR. c. Elevated trop 06/2014 felt due to demand ischemia.  . Chronic diastolic CHF (congestive heart failure) (HCC)    a. 07/2008 Echo: EF 60-65%, Triv AI/MR, Mild TR;  b. 09/2013 Echo: EF 60-65%, mild conc LVH.  [name] Difficult intubation   . Duodenitis   . GERD (gastroesophageal reflux disease)   . Headache(784.0)   . History of positive PPD   . Hyperlipidemia   . Hypertension   . Lactose intolerance   . Morbid obesity (HCC)   . Osteoporosis   . PAF (paroxysmal atrial fibrillation) (HCC)    a. s/p multiple cardioversions;  b. 2010: on Tikosyn - dose decreased from to in setting of NSTEMI/QT prolongation. Did not hold NSR. c. admitted 08/2011 with loading at BID with DCCV at that time;  d. Chronic coumadin (CHA2DS2VASc = 5). e. 06/2014: Joice Lofts stopped due to ineffective; started on amiodarone.  . Renal insufficiency    a. Baseline Cr appears 1.1-1.2.  . Rosacea     Current Outpatient Medications  Medication Sig Dispense Refill  . acetaminophen (TYLENOL) 500 MG tablet Take 1,000 mg by mouth every 6  (six) hours as needed for headache.    . furosemide (LASIX) 40 MG tablet Take 1 and 1/2 tablets by mouth daily 135 tablet 3  . magnesium oxide (MAG-OX) 400 MG tablet Take 1 tablet (400 mg total) by mouth daily. 90 tablet 2  . metoprolol tartrate (LOPRESSOR) 50 MG tablet TAKE 1 AND 1/2 TABLETS TWICE A DAY 270 tablet 3  . warfarin (COUMADIN) 2.5 MG tablet Take 1/2 to 1 tablet daily as directed by Coumadin clinic 75 tablet 1   No current facility-administered medications for this visit.     Allergies  Allergen Reactions  . Statins Other (See Comments)    Muscle and joint pain    Family History  Problem Relation Age of Onset  . Other Mother        GI Bleed  . Pneumonia Father   . Coronary artery disease Brother   . Breast cancer Paternal Grandmother   . Rectal cancer Paternal Grandfather     Social History   Socioeconomic History  . Marital status: Widowed    Spouse name: Not on file  . Number of children: 2  . Years of education: Not on file  . Highest education level: Not on file  Occupational History  . Occupation: retired    Associate Professor: RETIRED  Social Needs  . Financial resource strain: Not on  file  . Food insecurity    Worry: Not on file    Inability: Not on file  . Transportation needs    Medical: Not on file    Non-medical: Not on file  Tobacco Use  . Smoking status: Never Smoker  . Smokeless tobacco: Never Used  Substance and Sexual Activity  . Alcohol use: No    Alcohol/week: 0.0 standard drinks  . Drug use: No  . Sexual activity: Not Currently    Birth control/protection: Post-menopausal  Lifestyle  . Physical activity    Days per week: Not on file    Minutes per session: Not on file  . Stress: Not on file  Relationships  . Social Musicianconnections    Talks on phone: Not on file    Gets together: Not on file    Attends religious service: Not on file    Active member of club or organization: Not on file    Attends meetings of clubs or organizations: Not  on file    Relationship status: Not on file  . Intimate partner violence    Fear of current or ex partner: Not on file    Emotionally abused: Not on file    Physically abused: Not on file    Forced sexual activity: Not on file  Other Topics Concern  . Not on file  Social History Narrative   Lives in [place], [place] w/ husband.     Constitutional: Denies fever, malaise, fatigue, headache or abrupt weight changes.  Gastrointestinal: Pt reports nausea, constipation and blood in stool. Denies abdominal pain, bloating, diarrhea.    No other specific complaints in a complete review of systems (except as listed in HPI above).  Objective:   Physical Exam   BP (!) 142/70   Pulse 100   Temp (!) 97.5 F (36.4 C) (Temporal)   Wt 275 lb (124.7 kg)   SpO2 97%   BMI 45.76 kg/m  Wt Readings from Last 3 Encounters:  10/16/18 275 lb (124.7 kg)  09/22/18 279 lb 12.8 oz (126.9 kg)  11/05/17 282 lb (127.9 kg)    General: Appears her stated age, obese, in NAD. Abdomen: Soft and mildly tender in the RLQ. No rebound tenderness. Normal bowel sounds. No distention or masses noted.  Neurological: Alert and oriented. :  BMET    Component Value Date/Time   NA 140 10/08/2018 1015   K 4.2 10/08/2018 1015   CL 98 10/08/2018 1015   CO2 26 10/08/2018 1015   GLUCOSE 103 (H) 10/08/2018 1015   GLUCOSE 96 09/11/2016 1026   BUN 20 10/08/2018 1015   CREATININE 1.37 (H) 10/08/2018 1015   CREATININE 1.15 (H) 04/22/2014 1240   CALCIUM 9.9 10/08/2018 1015   GFRNONAA 38 (L) 10/08/2018 1015   GFRAA 44 (L) 10/08/2018 1015    Lipid Panel     Component Value Date/Time   CHOL 189 11/02/2015 1242   TRIG 121.0 11/02/2015 1242   HDL 48.40 11/02/2015 1242   CHOLHDL 4 11/02/2015 1242   VLDL 24.2 11/02/2015 1242   LDLCALC 116 (H) 11/02/2015 1242    CBC    Component Value Date/Time   WBC 7.0 09/22/2018 1120   WBC 12.0 (H) 01/07/2015 1202   RBC 5.53 (H) 09/22/2018 1120   RBC 5.24 (H) 01/07/2015 1202    HGB 15.3 09/22/2018 1120   HCT 46.3 09/22/2018 1120   PLT 260 09/22/2018 1120   MCV 84 09/22/2018 1120   MCH 27.7 09/22/2018 1120   MCH  27.6 06/25/2014 1026   MCHC 33.0 09/22/2018 1120   MCHC 33.0 01/07/2015 1202   RDW 12.6 09/22/2018 1120   LYMPHSABS 1.3 09/22/2018 1120   MONOABS 0.7 01/07/2015 1202   EOSABS 0.4 09/22/2018 1120   BASOSABS 0.1 09/22/2018 1120    Hgb A1C Lab Results  Component Value Date   HGBA1C 5.7 (H) 09/23/2013           Assessment & Plan:   Constipation:  Improved after BM today Advised her to start Metamucil or Benefiber daily Advised her to drink as much water as allowed (on fluid restriction per cardiology) Encouraged regular activity such as walking to stimulate bowels  Advised her to follow up if symptoms persist or worsen Webb Silversmith, NP

## 2018-10-16 NOTE — Patient Instructions (Signed)

## 2018-10-17 ENCOUNTER — Ambulatory Visit (INDEPENDENT_AMBULATORY_CARE_PROVIDER_SITE_OTHER): Payer: Medicare Other | Admitting: *Deleted

## 2018-10-17 DIAGNOSIS — Z5181 Encounter for therapeutic drug level monitoring: Secondary | ICD-10-CM

## 2018-10-17 DIAGNOSIS — I4819 Other persistent atrial fibrillation: Secondary | ICD-10-CM | POA: Diagnosis not present

## 2018-10-17 DIAGNOSIS — I4891 Unspecified atrial fibrillation: Secondary | ICD-10-CM | POA: Diagnosis not present

## 2018-10-17 DIAGNOSIS — Z7901 Long term (current) use of anticoagulants: Secondary | ICD-10-CM | POA: Diagnosis not present

## 2018-10-17 LAB — POCT INR: INR: 2.4 (ref 2.0–3.0)

## 2018-10-17 NOTE — Patient Instructions (Signed)
Description    Continue on same dosage 1/2 tablet daily except 1 tablet on Tuesdays, Thursdays, and Saturdays.  Recheck INR in 6 weeks. Call with any questions: [phone]    

## 2018-11-05 DIAGNOSIS — Z5181 Encounter for therapeutic drug level monitoring: Secondary | ICD-10-CM | POA: Diagnosis not present

## 2018-11-05 LAB — BASIC METABOLIC PANEL
BUN/Creatinine Ratio: 14 (ref 12–28)
BUN: 17 mg/dL (ref 8–27)
CO2: 26 mmol/L (ref 20–29)
Calcium: 10.3 mg/dL (ref 8.7–10.3)
Chloride: 98 mmol/L (ref 96–106)
Creatinine, Ser: 1.22 mg/dL — ABNORMAL HIGH (ref 0.57–1.00)
GFR calc Af Amer: 50 mL/min/{1.73_m2} — ABNORMAL LOW (ref >59)
GFR calc non Af Amer: 43 mL/min/{1.73_m2} — ABNORMAL LOW (ref >59)
Glucose: 101 mg/dL — ABNORMAL HIGH (ref 65–99)
Potassium: 4.5 mmol/L (ref 3.5–5.2)
Sodium: 140 mmol/L (ref 134–144)

## 2018-11-28 ENCOUNTER — Ambulatory Visit (INDEPENDENT_AMBULATORY_CARE_PROVIDER_SITE_OTHER): Payer: Medicare Other | Admitting: *Deleted

## 2018-11-28 ENCOUNTER — Other Ambulatory Visit: Payer: Self-pay

## 2018-11-28 DIAGNOSIS — Z7901 Long term (current) use of anticoagulants: Secondary | ICD-10-CM

## 2018-11-28 DIAGNOSIS — I4819 Other persistent atrial fibrillation: Secondary | ICD-10-CM | POA: Diagnosis not present

## 2018-11-28 DIAGNOSIS — I4891 Unspecified atrial fibrillation: Secondary | ICD-10-CM | POA: Diagnosis not present

## 2018-11-28 DIAGNOSIS — Z5181 Encounter for therapeutic drug level monitoring: Secondary | ICD-10-CM | POA: Diagnosis not present

## 2018-11-28 LAB — POCT INR: INR: 2.2 (ref 2.0–3.0)

## 2018-11-28 NOTE — Patient Instructions (Signed)
Description    Continue on same dosage 1/2 tablet daily except 1 tablet on Tuesdays, Thursdays, and Saturdays.  Recheck INR in 6 weeks. Call with any questions: [phone]

## 2018-12-09 ENCOUNTER — Inpatient Hospital Stay (HOSPITAL_COMMUNITY)
Admission: EM | Admit: 2018-12-09 | Discharge: 2018-12-11 | DRG: 086 | Disposition: A | Discharge status: Home Health | Payer: Medicare Other | Attending: Neurosurgery | Admitting: Neurosurgery

## 2018-12-09 ENCOUNTER — Encounter (HOSPITAL_COMMUNITY): Payer: Self-pay

## 2018-12-09 ENCOUNTER — Emergency Department (HOSPITAL_COMMUNITY): Payer: Medicare Other

## 2018-12-09 ENCOUNTER — Other Ambulatory Visit: Payer: Self-pay

## 2018-12-09 DIAGNOSIS — I6200 Nontraumatic subdural hemorrhage, unspecified: Secondary | ICD-10-CM

## 2018-12-09 DIAGNOSIS — I5032 Chronic diastolic (congestive) heart failure: Secondary | ICD-10-CM | POA: Diagnosis present

## 2018-12-09 DIAGNOSIS — W010XXA Fall on same level from slipping, tripping and stumbling without subsequent striking against object, initial encounter: Secondary | ICD-10-CM | POA: Diagnosis present

## 2018-12-09 DIAGNOSIS — S066X0A Traumatic subarachnoid hemorrhage without loss of consciousness, initial encounter: Principal | ICD-10-CM | POA: Diagnosis present

## 2018-12-09 DIAGNOSIS — Z881 Allergy status to other antibiotic agents status: Secondary | ICD-10-CM

## 2018-12-09 DIAGNOSIS — I110 Hypertensive heart disease with heart failure: Secondary | ICD-10-CM | POA: Diagnosis present

## 2018-12-09 DIAGNOSIS — Z888 Allergy status to other drugs, medicaments and biological substances status: Secondary | ICD-10-CM

## 2018-12-09 DIAGNOSIS — I252 Old myocardial infarction: Secondary | ICD-10-CM | POA: Diagnosis not present

## 2018-12-09 DIAGNOSIS — I2510 Atherosclerotic heart disease of native coronary artery without angina pectoris: Secondary | ICD-10-CM | POA: Diagnosis present

## 2018-12-09 DIAGNOSIS — S0003XA Contusion of scalp, initial encounter: Secondary | ICD-10-CM | POA: Diagnosis not present

## 2018-12-09 DIAGNOSIS — W19XXXA Unspecified fall, initial encounter: Secondary | ICD-10-CM | POA: Diagnosis not present

## 2018-12-09 DIAGNOSIS — Z7901 Long term (current) use of anticoagulants: Secondary | ICD-10-CM

## 2018-12-09 DIAGNOSIS — I609 Nontraumatic subarachnoid hemorrhage, unspecified: Secondary | ICD-10-CM

## 2018-12-09 DIAGNOSIS — Z8249 Family history of ischemic heart disease and other diseases of the circulatory system: Secondary | ICD-10-CM | POA: Diagnosis not present

## 2018-12-09 DIAGNOSIS — Z9049 Acquired absence of other specified parts of digestive tract: Secondary | ICD-10-CM

## 2018-12-09 DIAGNOSIS — S199XXA Unspecified injury of neck, initial encounter: Secondary | ICD-10-CM | POA: Diagnosis not present

## 2018-12-09 DIAGNOSIS — I480 Paroxysmal atrial fibrillation: Secondary | ICD-10-CM | POA: Diagnosis present

## 2018-12-09 DIAGNOSIS — S065X0A Traumatic subdural hemorrhage without loss of consciousness, initial encounter: Secondary | ICD-10-CM | POA: Diagnosis present

## 2018-12-09 DIAGNOSIS — Z96653 Presence of artificial knee joint, bilateral: Secondary | ICD-10-CM | POA: Diagnosis present

## 2018-12-09 DIAGNOSIS — Z20828 Contact with and (suspected) exposure to other viral communicable diseases: Secondary | ICD-10-CM | POA: Diagnosis present

## 2018-12-09 DIAGNOSIS — Z955 Presence of coronary angioplasty implant and graft: Secondary | ICD-10-CM | POA: Diagnosis not present

## 2018-12-09 DIAGNOSIS — Y92009 Unspecified place in unspecified non-institutional (private) residence as the place of occurrence of the external cause: Secondary | ICD-10-CM

## 2018-12-09 DIAGNOSIS — I10 Essential (primary) hypertension: Secondary | ICD-10-CM | POA: Diagnosis not present

## 2018-12-09 DIAGNOSIS — Z90711 Acquired absence of uterus with remaining cervical stump: Secondary | ICD-10-CM

## 2018-12-09 DIAGNOSIS — M810 Age-related osteoporosis without current pathological fracture: Secondary | ICD-10-CM | POA: Diagnosis present

## 2018-12-09 DIAGNOSIS — S0191XA Laceration without foreign body of unspecified part of head, initial encounter: Secondary | ICD-10-CM | POA: Diagnosis not present

## 2018-12-09 DIAGNOSIS — I959 Hypotension, unspecified: Secondary | ICD-10-CM | POA: Diagnosis not present

## 2018-12-09 DIAGNOSIS — R110 Nausea: Secondary | ICD-10-CM | POA: Diagnosis not present

## 2018-12-09 HISTORY — DX: Nontraumatic subarachnoid hemorrhage, unspecified: I60.9

## 2018-12-09 LAB — CBC WITH DIFFERENTIAL/PLATELET
Abs Immature Granulocytes: 0.08 10*3/uL — ABNORMAL HIGH (ref 0.00–0.07)
Basophils Absolute: 0 10*3/uL (ref 0.0–0.1)
Basophils Relative: 0 %
Eosinophils Absolute: 0.1 10*3/uL (ref 0.0–0.5)
Eosinophils Relative: 1 %
HCT: 44.4 % (ref 36.0–46.0)
Hemoglobin: 14.4 g/dL (ref 12.0–15.0)
Immature Granulocytes: 1 %
Lymphocytes Relative: 7 %
Lymphs Abs: 1 10*3/uL (ref 0.7–4.0)
MCH: 28 pg (ref 26.0–34.0)
MCHC: 32.4 g/dL (ref 30.0–36.0)
MCV: 86.4 fL (ref 80.0–100.0)
Monocytes Absolute: 0.5 10*3/uL (ref 0.1–1.0)
Monocytes Relative: 4 %
Neutro Abs: 13.1 10*3/uL — ABNORMAL HIGH (ref 1.7–7.7)
Neutrophils Relative %: 87 %
Platelets: 263 10*3/uL (ref 150–400)
RBC: 5.14 MIL/uL — ABNORMAL HIGH (ref 3.87–5.11)
RDW: 13.4 % (ref 11.5–15.5)
WBC: 14.8 10*3/uL — ABNORMAL HIGH (ref 4.0–10.5)
nRBC: 0 % (ref 0.0–0.2)

## 2018-12-09 LAB — BASIC METABOLIC PANEL
Anion gap: 11 (ref 5–15)
BUN: 20 mg/dL (ref 8–23)
CO2: 26 mmol/L (ref 22–32)
Calcium: 9.4 mg/dL (ref 8.9–10.3)
Chloride: 102 mmol/L (ref 98–111)
Creatinine, Ser: 1.4 mg/dL — ABNORMAL HIGH (ref 0.44–1.00)
GFR calc Af Amer: 42 mL/min — ABNORMAL LOW (ref >60)
GFR calc non Af Amer: 37 mL/min — ABNORMAL LOW (ref >60)
Glucose, Bld: 130 mg/dL — ABNORMAL HIGH (ref 70–99)
Potassium: 3.8 mmol/L (ref 3.5–5.1)
Sodium: 139 mmol/L (ref 135–145)

## 2018-12-09 LAB — TYPE AND SCREEN
ABO/RH(D): O POS
Antibody Screen: NEGATIVE

## 2018-12-09 LAB — PROTIME-INR
INR: 2.2 — ABNORMAL HIGH (ref 0.8–1.2)
INR: 1.3 — ABNORMAL HIGH (ref 0.8–1.2)
Prothrombin Time: 24.1 seconds — ABNORMAL HIGH (ref 11.4–15.2)
Prothrombin Time: 16.4 seconds — ABNORMAL HIGH (ref 11.4–15.2)

## 2018-12-09 LAB — APTT: aPTT: 35 seconds (ref 24–36)

## 2018-12-09 LAB — ABO/RH: ABO/RH(D): O POS

## 2018-12-09 MED ORDER — VITAMIN K1 10 MG/ML IJ SOLN
10.0000 mg | INTRAVENOUS | Status: AC
  Administered 2018-12-09: 10 mg via INTRAVENOUS
  Filled 2018-12-09: qty 1

## 2018-12-09 MED ORDER — MAGNESIUM OXIDE 400 (241.3 MG) MG PO TABS
400.0000 mg | ORAL_TABLET | Freq: Every day | ORAL | Status: DC
  Administered 2018-12-09 – 2018-12-10 (×2): 400 mg via ORAL
  Filled 2018-12-09 (×6): qty 1

## 2018-12-09 MED ORDER — LORATADINE 10 MG PO TABS: 10.0000 mg | ORAL_TABLET | Freq: Every day | ORAL | Status: DC | PRN

## 2018-12-09 MED ORDER — ACETAMINOPHEN 650 MG RE SUPP: 650.0000 mg | Freq: Four times a day (QID) | RECTAL | Status: DC | PRN

## 2018-12-09 MED ORDER — MORPHINE SULFATE (PF) 2 MG/ML IV SOLN
2.0000 mg | Freq: Once | INTRAVENOUS | Status: AC
  Administered 2018-12-09: 2 mg via INTRAVENOUS
  Filled 2018-12-09: qty 1

## 2018-12-09 MED ORDER — POLYETHYLENE GLYCOL 3350 17 G PO PACK: 17.0000 g | PACK | Freq: Every day | ORAL | Status: DC | PRN

## 2018-12-09 MED ORDER — METOPROLOL TARTRATE 50 MG PO TABS
75.0000 mg | ORAL_TABLET | Freq: Two times a day (BID) | ORAL | Status: DC
  Administered 2018-12-09 – 2018-12-11 (×4): 75 mg via ORAL
  Filled 2018-12-09: qty 3
  Filled 2018-12-09 (×3): qty 1

## 2018-12-09 MED ORDER — ACETAMINOPHEN 325 MG PO TABS
650.0000 mg | ORAL_TABLET | Freq: Four times a day (QID) | ORAL | Status: DC | PRN
  Administered 2018-12-11: 650 mg via ORAL
  Filled 2018-12-09: qty 2

## 2018-12-09 MED ORDER — HYDROCODONE-ACETAMINOPHEN 5-325 MG PO TABS
1.0000 | ORAL_TABLET | ORAL | Status: DC | PRN
  Administered 2018-12-09 – 2018-12-10 (×4): 2 via ORAL
  Administered 2018-12-11: 1 via ORAL
  Filled 2018-12-09 (×5): qty 2

## 2018-12-09 MED ORDER — ONDANSETRON HCL 4 MG/2ML IJ SOLN
4.0000 mg | Freq: Once | INTRAMUSCULAR | Status: AC
  Administered 2018-12-09: 4 mg via INTRAVENOUS
  Filled 2018-12-09: qty 2

## 2018-12-09 MED ORDER — PROTHROMBIN COMPLEX CONC HUMAN 500 UNITS IV KIT
1628.0000 [IU] | PACK | Status: AC
  Administered 2018-12-09: 1628 [IU] via INTRAVENOUS
  Filled 2018-12-09: qty 1628

## 2018-12-09 MED ORDER — FUROSEMIDE 40 MG PO TABS
60.0000 mg | ORAL_TABLET | Freq: Every day | ORAL | Status: DC
  Administered 2018-12-10: 60 mg via ORAL
  Filled 2018-12-09 (×2): qty 1

## 2018-12-09 NOTE — ED Notes (Signed)
Sudie Grumbling, RN bedside report on 4N

## 2018-12-09 NOTE — ED Notes (Signed)
Patient transported to CT 

## 2018-12-09 NOTE — ED Notes (Signed)
ED TO INPATIENT HANDOFF REPORT  ED Nurse Name and Phone #: Percival Spanish [phone]  S Name/Age/Gender [name] 73 y.o. female Room/Bed: 027C/027C  Code Status   Code Status: Prior  Home/SNF/Other Rehab Patient oriented to: self, place, time and situation Is this baseline? Yes   Triage Complete: Triage complete  Chief Complaint fall repetitive questioning  Triage Note Pt arrived via [facility] from home for witnessed ground level fall. Son told EMS pt tripped and fell and hit posterior of head, but did not lose consciousness. Pt is on coumadin. Pt is A&Ox4. The posterior of pt's head is bleeding a little and pt's head is soft to the touch where bleeding is, like the finger could go into the head. Pt denies neck and back pain, but head is painful. GCS 15.Pt is A-fib on monitor. VS WNL.   Allergies Allergies  Allergen Reactions  . Other Other (See Comments)    Most antibiotics cause yeast infections- Keflex IS tolerated  . Statins Other (See Comments)    Muscle and joint pain    Level of Care/Admitting Diagnosis ED Disposition    ED Disposition Condition Comment   Admit  Hospital Area: [facility] [[id number]]  Level of Care: Progressive [102]  Admit to Progressive based on following criteria: NEUROLOGICAL AND NEUROSURGICAL complex patients with significant risk of instability, who do not meet ICU criteria, yet require close observation or frequent assessment (< / = every 2 - 4 hours) with medical / nursing intervention.  Covid Evaluation: Asymptomatic Screening Protocol (No Symptoms)  Diagnosis: Subarachnoid hemorrhage (HCC) [430.ICD-9-CM]  Admitting Physician: Julio Sicks [phone]  Attending Physician: Julio Sicks [phone]  Estimated length of stay: past midnight tomorrow  Certification:: I certify this patient will need inpatient services for at least 2 midnights  PT Class (Do Not Modify): Inpatient [101]  PT Acc Code (Do Not Modify): Private [1]        B Medical/Surgery History Past Medical History:  Diagnosis Date  . Arthritis   . CAD (coronary artery disease)    a. 07/2008 NSTEMI ->Cath: LM nl, LAD 70p/90p, 1m, LCX nl, RCA nl, EF 60%.  The LAD was stented with a 2.25x76mm Veri-Flex BMS.  b. 8/15 cath patent stent, nonobstructive disease otherwise - unchanged. Elevated troponin felt due to demand ischemia in setting of AF-RVR. c. Elevated trop 06/2014 felt due to demand ischemia.  . Chronic diastolic CHF (congestive heart failure) (HCC)    a. 07/2008 Echo: EF 60-65%, Triv AI/MR, Mild TR;  b. 09/2013 Echo: EF 60-65%, mild conc LVH.  [name] Difficult intubation   . Duodenitis   . GERD (gastroesophageal reflux disease)   . Headache(784.0)   . History of positive PPD   . Hyperlipidemia   . Hypertension   . Lactose intolerance   . Morbid obesity (HCC)   . Osteoporosis   . PAF (paroxysmal atrial fibrillation) (HCC)    a. s/p multiple cardioversions;  b. 2010: on Tikosyn - dose decreased from to in setting of NSTEMI/QT prolongation. Did not hold NSR. c. admitted 08/2011 with loading at BID with DCCV at that time;  d. Chronic coumadin (CHA2DS2VASc = 5). e. 06/2014: Joice Lofts stopped due to ineffective; started on amiodarone.  . Renal insufficiency    a. Baseline Cr appears 1.1-1.2.  . Rosacea    Past Surgical History:  Procedure Laterality Date  . ABDOMINAL HYSTERECTOMY     partial, bleeding  . CHOLECYSTECTOMY    . CORONARY ANGIOPLASTY WITH STENT  PLACEMENT  2010   2.75- x 20-mm VeriFlex DES to the pLAD  . CORONARY STENT PLACEMENT    . ESOPHAGOGASTRODUODENOSCOPY  2003  . KNEE ARTHROSCOPY  2000 & 2001   left and right  . LEFT HEART CATHETERIZATION WITH CORONARY ANGIOGRAM N/A 09/24/2013   Procedure: LEFT HEART CATHETERIZATION WITH CORONARY ANGIOGRAM;  Surgeon: Micheline ChapmanMichael D Cooper, MD;  [facility] [place], LAD stent patent, mLAD 50-60%, CFX OK, RCA 30-40%, EF 70%  . TOTAL KNEE ARTHROPLASTY     bilateral  . TUBAL LIGATION        A IV Location/Drains/Wounds Patient Lines/Drains/Airways Status   Active Line/Drains/Airways    Name:   Placement date:   Placement time:   Site:   Days:   Peripheral IV 12/09/18 Left Antecubital   12/09/18    -    Antecubital   less than 1   Peripheral IV 12/09/18 Right Antecubital   12/09/18    2122    Antecubital   less than 1          Intake/Output Last 24 hours  Intake/Output Summary (Last 24 hours) at 12/09/2018 2300 Last data filed at 12/09/2018 2207 Gross per 24 hour  Intake 150 ml  Output -  Net 150 ml    Labs/Imaging Results for orders placed or performed during the hospital encounter of 12/09/18 (from the past 48 hour(s))  Basic metabolic panel     Status: Abnormal   Collection Time: 12/09/18  7:34 PM  Result Value Ref Range   Sodium 139 135 - 145 mmol/L   Potassium 3.8 3.5 - 5.1 mmol/L   Chloride 102 98 - 111 mmol/L   CO2 26 22 - 32 mmol/L   Glucose, Bld 130 (H) 70 - 99 mg/dL   BUN 20 8 - 23 mg/dL   Creatinine, Ser 4.091.40 (H) 0.44 - 1.00 mg/dL   Calcium 9.4 8.9 - 81.110.3 mg/dL   GFR calc non Af Amer 37 (L) >60 mL/min   GFR calc Af Amer 42 (L) >60 mL/min   Anion gap 11 5 - 15    Comment: Performed at [facility], [address] [place], [place] [address]  CBC with Differential     Status: Abnormal   Collection Time: 12/09/18  7:34 PM  Result Value Ref Range   WBC 14.8 (H) 4.0 - 10.5 K/uL   RBC 5.14 (H) 3.87 - 5.11 MIL/uL   Hemoglobin 14.4 12.0 - 15.0 g/dL   HCT 29.544.4 62.136.0 - 30.846.0 %   MCV 86.4 80.0 - 100.0 fL   MCH 28.0 26.0 - 34.0 pg   MCHC 32.4 30.0 - 36.0 g/dL   RDW 65.713.4 84.611.5 - 96.215.5 %   Platelets 263 150 - 400 K/uL   nRBC 0.0 0.0 - 0.2 %   Neutrophils Relative % 87 %   Neutro Abs 13.1 (H) 1.7 - 7.7 K/uL   Lymphocytes Relative 7 %   Lymphs Abs 1.0 0.7 - 4.0 K/uL   Monocytes Relative 4 %   Monocytes Absolute 0.5 0.1 - 1.0 K/uL   Eosinophils Relative 1 %   Eosinophils Absolute 0.1 0.0 - 0.5 K/uL   Basophils Relative 0 %   Basophils Absolute  0.0 0.0 - 0.1 K/uL   Immature Granulocytes 1 %   Abs Immature Granulocytes 0.08 (H) 0.00 - 0.07 K/uL    Comment: Performed at [facility], [address] [place], [place] [address]  Protime-INR     Status: Abnormal  Collection Time: 12/09/18  7:34 PM  Result Value Ref Range   Prothrombin Time 24.1 (H) 11.4 - 15.2 seconds   INR 2.2 (H) 0.8 - 1.2    Comment: (NOTE) INR goal varies based on device and disease states. Performed at [facility], [address] [place], [place] [address]   Type and screen [facility]     Status: None   Collection Time: 12/09/18  8:50 PM  Result Value Ref Range   ABO/RH(D) O POS    Antibody Screen NEG    Sample Expiration      12/12/2018,2359 Performed at [facility], [address] [place], [place] [address]   APTT     Status: None   Collection Time: 12/09/18  8:50 PM  Result Value Ref Range   aPTT 35 24 - 36 seconds    Comment: Performed at [facility], [address] [place], [place] [address]  ABO/Rh     Status: None   Collection Time: 12/09/18  8:50 PM  Result Value Ref Range   ABO/RH(D)      O POS Performed at [facility], [address] [place], [place] [address]    Ct Head Wo Contrast  Result Date: 12/09/2018 CLINICAL DATA:  Fall.  On Coumadin. EXAM: CT HEAD WITHOUT CONTRAST CT CERVICAL SPINE WITHOUT CONTRAST TECHNIQUE: Multidetector CT imaging of the head and cervical spine was performed following the standard protocol without intravenous contrast. Multiplanar CT image reconstructions of the cervical spine were also generated. COMPARISON:  None. FINDINGS: CT HEAD FINDINGS Brain: There is small volume acute subarachnoid hemorrhage in cerebral sulci bilaterally. There is also trace subdural hematoma along the falx and possibly over the posterior left parietal convexity and along the tentorium. No acute infarct, mass, or midline shift is identified. The ventricles and sulci are normal in  size for age. Vascular: Calcified atherosclerosis at the skull base. No hyperdense vessel. Skull: No fracture or suspicious osseous lesion. Sinuses/Orbits: Paranasal sinuses and mastoid air cells are clear. Right cataract extraction. Other: Large left parietal scalp hematoma with gas consistent with laceration. CT CERVICAL SPINE FINDINGS Alignment: Reversal of the normal cervical lordosis. No significant listhesis. Skull base and vertebrae: No fracture or suspicious osseous lesion. Soft tissues and spinal canal: No prevertebral fluid or swelling. No visible canal hematoma. Disc levels: Mild disc and moderate facet degeneration in the cervical spine. Severe right neural foraminal stenosis at C3-4 due to uncovertebral and facet spurring. Upper chest: No apical lung consolidation or mass. Other: 2.2 cm fat containing right thyroid mass, benign in appearance. Mild calcific atherosclerosis of the carotid arteries. IMPRESSION: 1. Small volume subarachnoid and subdural hemorrhage. No intracranial mass effect. 2. Large parietal scalp hematoma.  No skull fracture. 3. No cervical spine fracture. Critical Value/emergent results were called by telephone at the time of interpretation on 12/09/2018 at 8:25 pm to Dr. Arby BarretteMarcy Pfeiffer , who verbally acknowledged these results. Electronically Signed   By: Sebastian AcheAllen  Grady M.D.   On: 12/09/2018 20:26   Ct Cervical Spine Wo Contrast  Result Date: 12/09/2018 CLINICAL DATA:  Fall.  On Coumadin. EXAM: CT HEAD WITHOUT CONTRAST CT CERVICAL SPINE WITHOUT CONTRAST TECHNIQUE: Multidetector CT imaging of the head and cervical spine was performed following the standard protocol without intravenous contrast. Multiplanar CT image reconstructions of the cervical spine were also generated. COMPARISON:  None. FINDINGS: CT HEAD FINDINGS Brain: There is small volume acute subarachnoid hemorrhage in cerebral sulci  bilaterally. There is also trace subdural hematoma along the falx and possibly over the  posterior left parietal convexity and along the tentorium. No acute infarct, mass, or midline shift is identified. The ventricles and sulci are normal in size for age. Vascular: Calcified atherosclerosis at the skull base. No hyperdense vessel. Skull: No fracture or suspicious osseous lesion. Sinuses/Orbits: Paranasal sinuses and mastoid air cells are clear. Right cataract extraction. Other: Large left parietal scalp hematoma with gas consistent with laceration. CT CERVICAL SPINE FINDINGS Alignment: Reversal of the normal cervical lordosis. No significant listhesis. Skull base and vertebrae: No fracture or suspicious osseous lesion. Soft tissues and spinal canal: No prevertebral fluid or swelling. No visible canal hematoma. Disc levels: Mild disc and moderate facet degeneration in the cervical spine. Severe right neural foraminal stenosis at C3-4 due to uncovertebral and facet spurring. Upper chest: No apical lung consolidation or mass. Other: 2.2 cm fat containing right thyroid mass, benign in appearance. Mild calcific atherosclerosis of the carotid arteries. IMPRESSION: 1. Small volume subarachnoid and subdural hemorrhage. No intracranial mass effect. 2. Large parietal scalp hematoma.  No skull fracture. 3. No cervical spine fracture. Critical Value/emergent results were called by telephone at the time of interpretation on 12/09/2018 at 8:25 pm to Dr. Charlesetta Shanks , who verbally acknowledged these results. Electronically Signed   By: Logan Bores M.D.   On: 12/09/2018 20:26    Pending Labs Unresulted Labs (From admission, onward)    Start     Ordered   12/10/18 6967  Basic metabolic panel  Tomorrow morning,   R     12/09/18 2139   12/10/18 0500  CBC  Tomorrow morning,   R     12/09/18 2139   12/10/18 0500  Protime-INR  Tomorrow morning,   R     12/09/18 2139   12/09/18 2231  Protime-INR  ONCE - STAT,   STAT     12/09/18 2230          Vitals/Pain Today's Vitals   12/09/18 2200 12/09/18 2212  12/09/18 2215 12/09/18 2230  BP: 137/82 137/82  118/66  Pulse: 93 78 73 80  Resp: 17  16 17   SpO2: 93%  97%   Weight:      Height:      PainSc:        Isolation Precautions No active isolations  Medications Medications  metoprolol tartrate (LOPRESSOR) tablet 75 mg (75 mg Oral Given 12/09/18 2212)  magnesium oxide (MAG-OX) tablet 400 mg (400 mg Oral Given 12/09/18 2212)  loratadine (CLARITIN) tablet 10 mg (has no administration in time range)  acetaminophen (TYLENOL) tablet 650 mg (has no administration in time range)    Or  acetaminophen (TYLENOL) suppository 650 mg (has no administration in time range)  HYDROcodone-acetaminophen (NORCO/VICODIN) 5-325 MG per tablet 1-2 tablet (has no administration in time range)  polyethylene glycol (MIRALAX / GLYCOLAX) packet 17 g (has no administration in time range)  ondansetron (ZOFRAN) injection 4 mg (4 mg Intravenous Given 12/09/18 2013)  morphine 2 MG/ML injection 2 mg (2 mg Intravenous Given 12/09/18 2013)  prothrombin complex conc human (KCENTRA) IVPB 1,628 Units (0 Units Intravenous Stopped 12/09/18 2137)  phytonadione (VITAMIN K) 10 mg in dextrose 5 % 50 mL IVPB (0 mg Intravenous Stopped 12/09/18 2207)  morphine 2 MG/ML injection 2 mg (2 mg Intravenous Given 12/09/18 2122)    Mobility walks Low fall risk   Focused Assessments Neuro Assessment Handoff:  Swallow screen pass? Yes  Neuro Assessment: Within Defined Limits Neuro Checks:      Last Documented NIHSS Modified Score:   Has TPA been given? No If patient is a Neuro Trauma and patient is going to OR before floor call report to 4N Charge nurse: [phone] or [phone]     R Recommendations: See Admitting Provider Note  Report given to:   Additional Notes:

## 2018-12-09 NOTE — ED Provider Notes (Signed)
[facility] EMERGENCY DEPARTMENT Provider Note   CSN: [id number] Arrival date & time: 12/09/18  1836     History   Chief Complaint Chief Complaint  Patient presents with  . Fall    HPI [name] is a 73 y.o. female.     HPI Patient is anticoagulated on Coumadin.  She had gone to the bottom of the steps on the deck and a concrete landing carrying a walker for her son who had just had orthopedic surgery.  He was coming down the stairs so he saw the episode.  She had got to the bottom of the landing and then kind of took a step backwards losing her balance and falling first down onto her buttocks and then backwards hitting her head on the steps.  She did not have a complete loss of consciousness.  She had some confusion about answering questions after the event.  Patient does not recall the event.  She is awake and alert now.  She reports she does have a posterior headache.  She denies she has any changes in her vision.  She denies sensation of numbness or tingling into her arms or her legs.  She denies difficulty breathing or thoracic or chest pain.  Her son had her stay where she was and EMS transported her.  Patient did not try to get up or ambulate.  She reports she does feel nauseated. Past Medical History:  Diagnosis Date  . Arthritis   . CAD (coronary artery disease)    a. 07/2008 NSTEMI ->Cath: LM nl, LAD 70p/90p, 39m, LCX nl, RCA nl, EF 60%.  The LAD was stented with a 2.25x70mm Veri-Flex BMS.  b. 8/15 cath patent stent, nonobstructive disease otherwise - unchanged. Elevated troponin felt due to demand ischemia in setting of AF-RVR. c. Elevated trop 06/2014 felt due to demand ischemia.  . Chronic diastolic CHF (congestive heart failure) (HCC)    a. 07/2008 Echo: EF 60-65%, Triv AI/MR, Mild TR;  b. 09/2013 Echo: EF 60-65%, mild conc LVH.  [name] Difficult intubation   . Duodenitis   . GERD (gastroesophageal reflux disease)   . Headache(784.0)   . History of positive  PPD   . Hyperlipidemia   . Hypertension   . Lactose intolerance   . Morbid obesity (HCC)   . Osteoporosis   . PAF (paroxysmal atrial fibrillation) (HCC)    a. s/p multiple cardioversions;  b. 2010: on Tikosyn - dose decreased from to in setting of NSTEMI/QT prolongation. Did not hold NSR. c. admitted 08/2011 with loading at BID with DCCV at that time;  d. Chronic coumadin (CHA2DS2VASc = 5). e. 06/2014: Joice Lofts stopped due to ineffective; started on amiodarone.  . Renal insufficiency    a. Baseline Cr appears 1.1-1.2.  . Rosacea     Patient Active Problem List   Diagnosis Date Noted  . Pruritus 11/16/2015  . Estrogen deficiency 11/02/2015  . Morbid obesity, unspecified obesity type (HCC) 01/08/2015  . Dyspnea 01/07/2015  . Apical variant hypertrophic cardiomyopathy (HCC) 09/24/2013  . NSTEMI (non-ST elevated myocardial infarction) (HCC) 09/22/2013  . Encounter for therapeutic drug monitoring 03/11/2013  . Chronic diastolic CHF (congestive heart failure) (HCC) 06/26/2011  . Chronic anticoagulation 02/07/2011  . Coronary atherosclerosis 12/01/2008  . Obesity, morbid (HCC) 08/06/2006  . Essential hypertension 08/06/2006  . Osteoarthritis 08/06/2006  . Obstructive sleep apnea syndrome 08/06/2006  . URINARY INCONTINENCE, MIXED 08/06/2006  . HYPERCHOLESTEROLEMIA, PURE 08/01/2006  . Persistent atrial fibrillation (HCC)  07/10/2006    Past Surgical History:  Procedure Laterality Date  . ABDOMINAL HYSTERECTOMY     partial, bleeding  . CHOLECYSTECTOMY    . CORONARY ANGIOPLASTY WITH STENT PLACEMENT  2010   2.75- x 20-mm VeriFlex DES to the pLAD  . CORONARY STENT PLACEMENT    . ESOPHAGOGASTRODUODENOSCOPY  2003  . KNEE ARTHROSCOPY  2000 & 2001   left and right  . LEFT HEART CATHETERIZATION WITH CORONARY ANGIOGRAM N/A 09/24/2013   Procedure: LEFT HEART CATHETERIZATION WITH CORONARY ANGIOGRAM;  Surgeon: Micheline Chapman, MD;  [facility] [place], LAD stent patent, mLAD 50-60%, CFX  OK, RCA 30-40%, EF 70%  . TOTAL KNEE ARTHROPLASTY     bilateral  . TUBAL LIGATION       OB History   No obstetric history on file.      Home Medications    Prior to Admission medications   Medication Sig Start Date End Date Taking? Authorizing Provider  acetaminophen (TYLENOL) 500 MG tablet Take 1,000 mg by mouth every 6 (six) hours as needed for headache.    [provider]  furosemide (LASIX) 40 MG tablet Take 1 and 1/2 tablets by mouth daily 09/30/18   Rollene Rotunda, MD  magnesium oxide (MAG-OX) 400 MG tablet Take 1 tablet (400 mg total) by mouth daily. 04/09/16   Newman Nip, NP  metoprolol tartrate (LOPRESSOR) 50 MG tablet TAKE 1 AND 1/2 TABLETS TWICE A DAY 09/22/18   Rollene Rotunda, MD  warfarin (COUMADIN) 2.5 MG tablet Take 1/2 to 1 tablet daily as directed by Coumadin clinic 08/01/18   Rollene Rotunda, MD    Family History Family History  Problem Relation Age of Onset  . Other Mother        GI Bleed  . Pneumonia Father   . Coronary artery disease Brother   . Breast cancer Paternal Grandmother   . Rectal cancer Paternal Grandfather     Social History Social History   Tobacco Use  . Smoking status: Never Smoker  . Smokeless tobacco: Never Used  Substance Use Topics  . Alcohol use: No    Alcohol/week: 0.0 standard drinks  . Drug use: No     Allergies   Statins   Review of Systems Review of Systems 10 Systems reviewed and are negative for acute change except as noted in the HPI.   Physical Exam Updated Vital Signs BP (!) 118/53   Pulse 82   Resp 19   Ht 5\' 5"  (1.651 m)   Wt 129.3 kg   SpO2 97%   BMI 47.43 kg/m   Physical Exam Constitutional:      Comments: Patient is alert with GCS of 15.  No respiratory distress.  Answering questions appropriately.  HENT:     Head:     Comments: Patient has matted blood to the posterior scalp.  No active bleeding.  At this time will defer close examination cleaning until CT scan completed and  repair anticipated.    Nose: Nose normal.     Mouth/Throat:     Mouth: Mucous membranes are moist.     Pharynx: Oropharynx is clear.  Eyes:     Extraocular Movements: Extraocular movements intact.     Pupils: Pupils are equal, round, and reactive to light.  Neck:     Musculoskeletal: Neck supple.  Cardiovascular:     Comments: Irregularly irregular.  No gross rub murmur gallop. Pulmonary:     Effort: Pulmonary effort is normal.     Breath  sounds: Normal breath sounds.  Abdominal:     General: There is no distension.     Palpations: Abdomen is soft.     Tenderness: There is no abdominal tenderness. There is no guarding.  Musculoskeletal: Normal range of motion.        General: No swelling or deformity.     Right lower leg: No edema.     Left lower leg: No edema.  Skin:    General: Skin is warm and dry.  Neurological:     General: No focal deficit present.     Mental Status: She is oriented to person, place, and time.     Motor: No weakness.     Coordination: Coordination normal.      ED Treatments / Results  Labs (all labs ordered are listed, but only abnormal results are displayed) Labs Reviewed  BASIC METABOLIC PANEL  CBC WITH DIFFERENTIAL/PLATELET  PROTIME-INR    EKG None  Radiology No results found.  Procedures Procedures (including critical care time) CRITICAL CARE Performed by: Charlesetta Shanks   Total critical care time: 30 minutes  Critical care time was exclusive of separately billable procedures and treating other patients.  Critical care was necessary to treat or prevent imminent or life-threatening deterioration.  Critical care was time spent personally by me on the following activities: development of treatment plan with patient and/or surrogate as well as nursing, discussions with consultants, evaluation of patient's response to treatment, examination of patient, obtaining history from patient or surrogate, ordering and performing treatments and  interventions, ordering and review of laboratory studies, ordering and review of radiographic studies, pulse oximetry and re-evaluation of patient's condition. Medications Ordered in ED Medications  ondansetron (ZOFRAN) injection 4 mg (has no administration in time range)  morphine 2 MG/ML injection 2 mg (has no administration in time range)     Initial Impression / Assessment and Plan / ED Course  I have reviewed the triage vital signs and the nursing notes.  Pertinent labs & imaging results that were available during my care of the patient were reviewed by me and considered in my medical decision making (see chart for details).  Clinical Course as of Dec 19 1123  Tue Dec 09, 2018  2055 Consult: Reviewed with Dr. Trenton Gammon.  He will come to emergency department to see the patient and admit for observation.   [MP]    Clinical Course User Index [MP] Charlesetta Shanks, MD        After cleaning examining wound, there is large hematoma but no laceration for repair.  She does have abrasion that had some ooze of blood but no active bleeding persisting.  Patient is anticoagulated on Coumadin.  She fell and does have positive finding of small subarachnoid and subdural hemorrhage.  Her mental status is clear.  She is uncomfortable with headache.  Neurologic function is intact.  Neurosurgery has been consulted for admission.  Kcentra protocol initiated.  Final Clinical Impressions(s) / ED Diagnoses   Final diagnoses:  Subarachnoid hemorrhage ([place])  Subdural hemorrhage [facility])    ED Discharge Orders    None       Charlesetta Shanks, MD 12/19/18 1129

## 2018-12-09 NOTE — ED Triage Notes (Signed)
Pt arrived via [facility] from home for witnessed ground level fall. Son told EMS pt tripped and fell and hit posterior of head, but did not lose consciousness. Pt is on coumadin. Pt is A&Ox4. The posterior of pt's head is bleeding a little and pt's head is soft to the touch where bleeding is, like the finger could go into the head. Pt denies neck and back pain, but head is painful. GCS 15.Pt is A-fib on monitor. VS WNL.

## 2018-12-09 NOTE — H&P (Signed)
[name] is an 73 y.o. female.   Chief Complaint: Headache following a fall HPI: [name] has a history significant for coronary artery disease, hyperlipidemia, hypertension, paroxysmal atrial fibrillation (on Coumadin), and renal insufficiency. She was in her normal state of health when she was going down a set of stairs to assist her son, who recently had knee surgery. She was at the bottom of the stairs when she fell back, landing on her buttocks first, then striking the back of her head. She does not think she lost consciousness, but is amnestic to the event. She was transported by EMS to the emergency department. She is awake and alert at the time of the exam. She reports a posterior headache. She denies numbness or tingling of her upper or lower extremities. She denies chest pain, difficulty breathing, or shortness of breath. She does report nausea, but no vomiting.  Past Medical History:  Diagnosis Date  . Arthritis   . CAD (coronary artery disease)    a. 07/2008 NSTEMI ->Cath: LM nl, LAD 70p/90p, 12m, LCX nl, RCA nl, EF 60%.  The LAD was stented with a 2.25x6mm Veri-Flex BMS.  b. 8/15 cath patent stent, nonobstructive disease otherwise - unchanged. Elevated troponin felt due to demand ischemia in setting of AF-RVR. c. Elevated trop 06/2014 felt due to demand ischemia.  . Chronic diastolic CHF (congestive heart failure) (HCC)    a. 07/2008 Echo: EF 60-65%, Triv AI/MR, Mild TR;  b. 09/2013 Echo: EF 60-65%, mild conc LVH.  [name] Difficult intubation   . Duodenitis   . GERD (gastroesophageal reflux disease)   . Headache(784.0)   . History of positive PPD   . Hyperlipidemia   . Hypertension   . Lactose intolerance   . Morbid obesity (HCC)   . Osteoporosis   . PAF (paroxysmal atrial fibrillation) (HCC)    a. s/p multiple cardioversions;  b. 2010: on Tikosyn - dose decreased from to in setting of NSTEMI/QT prolongation. Did not hold NSR. c. admitted 08/2011 with loading at  BID with DCCV at that time;  d. Chronic coumadin (CHA2DS2VASc = 5). e. 06/2014: Joice Lofts stopped due to ineffective; started on amiodarone.  . Renal insufficiency    a. Baseline Cr appears 1.1-1.2.  . Rosacea     Past Surgical History:  Procedure Laterality Date  . ABDOMINAL HYSTERECTOMY     partial, bleeding  . CHOLECYSTECTOMY    . CORONARY ANGIOPLASTY WITH STENT PLACEMENT  2010   2.75- x 20-mm VeriFlex DES to the pLAD  . CORONARY STENT PLACEMENT    . ESOPHAGOGASTRODUODENOSCOPY  2003  . KNEE ARTHROSCOPY  2000 & 2001   left and right  . LEFT HEART CATHETERIZATION WITH CORONARY ANGIOGRAM N/A 09/24/2013   Procedure: LEFT HEART CATHETERIZATION WITH CORONARY ANGIOGRAM;  Surgeon: Micheline Chapman, MD;  [facility] [place], LAD stent patent, mLAD 50-60%, CFX OK, RCA 30-40%, EF 70%  . TOTAL KNEE ARTHROPLASTY     bilateral  . TUBAL LIGATION      Family History  Problem Relation Age of Onset  . Other Mother        GI Bleed  . Pneumonia Father   . Coronary artery disease Brother   . Breast cancer Paternal Grandmother   . Rectal cancer Paternal Grandfather    Social History:  reports that she has never smoked. She has never used smokeless tobacco. She reports that she does not drink alcohol or use drugs.  Allergies:  Allergies  Allergen Reactions  .  Other Other (See Comments)    Most antibiotics cause yeast infections- Keflex IS tolerated  . Statins Other (See Comments)    Muscle and joint pain    (Not in a hospital admission)   Results for orders placed or performed during the hospital encounter of 12/09/18 (from the past 48 hour(s))  Basic metabolic panel     Status: Abnormal   Collection Time: 12/09/18  7:34 PM  Result Value Ref Range   Sodium 139 135 - 145 mmol/L   Potassium 3.8 3.5 - 5.1 mmol/L   Chloride 102 98 - 111 mmol/L   CO2 26 22 - 32 mmol/L   Glucose, Bld 130 (H) 70 - 99 mg/dL   BUN 20 8 - 23 mg/dL   Creatinine, Ser 5.27 (H) 0.44 - 1.00 mg/dL   Calcium 9.4 8.9 - 78.2  mg/dL   GFR calc non Af Amer 37 (L) >60 mL/min   GFR calc Af Amer 42 (L) >60 mL/min   Anion gap 11 5 - 15    Comment: Performed at [facility], [address], [place], [place] [address]  CBC with Differential     Status: Abnormal   Collection Time: 12/09/18  7:34 PM  Result Value Ref Range   WBC 14.8 (H) 4.0 - 10.5 K/uL   RBC 5.14 (H) 3.87 - 5.11 MIL/uL   Hemoglobin 14.4 12.0 - 15.0 g/dL   HCT 61.4 43.1 - 54.0 %   MCV 86.4 80.0 - 100.0 fL   MCH 28.0 26.0 - 34.0 pg   MCHC 32.4 30.0 - 36.0 g/dL   RDW 08.6 76.1 - 95.0 %   Platelets 263 150 - 400 K/uL   nRBC 0.0 0.0 - 0.2 %   Neutrophils Relative % 87 %   Neutro Abs 13.1 (H) 1.7 - 7.7 K/uL   Lymphocytes Relative 7 %   Lymphs Abs 1.0 0.7 - 4.0 K/uL   Monocytes Relative 4 %   Monocytes Absolute 0.5 0.1 - 1.0 K/uL   Eosinophils Relative 1 %   Eosinophils Absolute 0.1 0.0 - 0.5 K/uL   Basophils Relative 0 %   Basophils Absolute 0.0 0.0 - 0.1 K/uL   Immature Granulocytes 1 %   Abs Immature Granulocytes 0.08 (H) 0.00 - 0.07 K/uL    Comment: Performed at [facility], [address] [place], [place] [address]  Protime-INR     Status: Abnormal   Collection Time: 12/09/18  7:34 PM  Result Value Ref Range   Prothrombin Time 24.1 (H) 11.4 - 15.2 seconds   INR 2.2 (H) 0.8 - 1.2    Comment: (NOTE) INR goal varies based on device and disease states. Performed at [facility], [address], [place], [place] [address]   Type and screen [facility]     Status: None (Preliminary result)   Collection Time: 12/09/18  8:50 PM  Result Value Ref Range   ABO/RH(D) PENDING    Antibody Screen PENDING    Sample Expiration      12/12/2018,2359 Performed at [facility], [address], [place], [place] [address]   APTT     Status: None   Collection Time: 12/09/18  8:50 PM  Result Value Ref Range   aPTT 35 24 - 36 seconds    Comment: Performed at [facility], [address], [place],  [place] [address]  ABO/Rh     Status: None (Preliminary result)   Collection  Time: 12/09/18  8:50 PM  Result Value Ref Range   ABO/RH(D)      O POS Performed at [facility] [address], [place], [place] [address]    Ct Head Wo Contrast  Result Date: 12/09/2018 CLINICAL DATA:  Fall.  On Coumadin. EXAM: CT HEAD WITHOUT CONTRAST CT CERVICAL SPINE WITHOUT CONTRAST TECHNIQUE: Multidetector CT imaging of the head and cervical spine was performed following the standard protocol without intravenous contrast. Multiplanar CT image reconstructions of the cervical spine were also generated. COMPARISON:  None. FINDINGS: CT HEAD FINDINGS Brain: There is small volume acute subarachnoid hemorrhage in cerebral sulci bilaterally. There is also trace subdural hematoma along the falx and possibly over the posterior left parietal convexity and along the tentorium. No acute infarct, mass, or midline shift is identified. The ventricles and sulci are normal in size for age. Vascular: Calcified atherosclerosis at the skull base. No hyperdense vessel. Skull: No fracture or suspicious osseous lesion. Sinuses/Orbits: Paranasal sinuses and mastoid air cells are clear. Right cataract extraction. Other: Large left parietal scalp hematoma with gas consistent with laceration. CT CERVICAL SPINE FINDINGS Alignment: Reversal of the normal cervical lordosis. No significant listhesis. Skull base and vertebrae: No fracture or suspicious osseous lesion. Soft tissues and spinal canal: No prevertebral fluid or swelling. No visible canal hematoma. Disc levels: Mild disc and moderate facet degeneration in the cervical spine. Severe right neural foraminal stenosis at C3-4 due to uncovertebral and facet spurring. Upper chest: No apical lung consolidation or mass. Other: 2.2 cm fat containing right thyroid mass, benign in appearance. Mild calcific atherosclerosis of the carotid arteries. IMPRESSION: 1. Small volume subarachnoid and subdural  hemorrhage. No intracranial mass effect. 2. Large parietal scalp hematoma.  No skull fracture. 3. No cervical spine fracture. Critical Value/emergent results were called by telephone at the time of interpretation on 12/09/2018 at 8:25 pm to Dr. Charlesetta Shanks , who verbally acknowledged these results. Electronically Signed   By: Logan Bores M.D.   On: 12/09/2018 20:26   Ct Cervical Spine Wo Contrast  Result Date: 12/09/2018 CLINICAL DATA:  Fall.  On Coumadin. EXAM: CT HEAD WITHOUT CONTRAST CT CERVICAL SPINE WITHOUT CONTRAST TECHNIQUE: Multidetector CT imaging of the head and cervical spine was performed following the standard protocol without intravenous contrast. Multiplanar CT image reconstructions of the cervical spine were also generated. COMPARISON:  None. FINDINGS: CT HEAD FINDINGS Brain: There is small volume acute subarachnoid hemorrhage in cerebral sulci bilaterally. There is also trace subdural hematoma along the falx and possibly over the posterior left parietal convexity and along the tentorium. No acute infarct, mass, or midline shift is identified. The ventricles and sulci are normal in size for age. Vascular: Calcified atherosclerosis at the skull base. No hyperdense vessel. Skull: No fracture or suspicious osseous lesion. Sinuses/Orbits: Paranasal sinuses and mastoid air cells are clear. Right cataract extraction. Other: Large left parietal scalp hematoma with gas consistent with laceration. CT CERVICAL SPINE FINDINGS Alignment: Reversal of the normal cervical lordosis. No significant listhesis. Skull base and vertebrae: No fracture or suspicious osseous lesion. Soft tissues and spinal canal: No prevertebral fluid or swelling. No visible canal hematoma. Disc levels: Mild disc and moderate facet degeneration in the cervical spine. Severe right neural foraminal stenosis at C3-4 due to uncovertebral and facet spurring. Upper chest: No apical lung consolidation or mass. Other: 2.2 cm fat containing  right thyroid mass, benign in appearance. Mild calcific atherosclerosis of the carotid arteries. IMPRESSION: 1. Small volume subarachnoid and  subdural hemorrhage. No intracranial mass effect. 2. Large parietal scalp hematoma.  No skull fracture. 3. No cervical spine fracture. Critical Value/emergent results were called by telephone at the time of interpretation on 12/09/2018 at 8:25 pm to Dr. Arby BarretteMarcy Pfeiffer , who verbally acknowledged these results. Electronically Signed   By: Sebastian AcheAllen  Grady M.D.   On: 12/09/2018 20:26    Review of Systems  Constitutional: Negative for chills, fever, malaise/fatigue and weight loss.  HENT: Negative for congestion, hearing loss, sinus pain, sore throat and tinnitus.   Eyes: Negative for blurred vision, double vision, photophobia and pain.  Respiratory: Negative for cough, sputum production, shortness of breath and wheezing.   Cardiovascular: Positive for leg swelling. Negative for chest pain, palpitations and orthopnea.  Gastrointestinal: Positive for nausea. Negative for abdominal pain, blood in stool, constipation, diarrhea, heartburn, melena and vomiting.  Genitourinary: Negative.   Musculoskeletal: Positive for falls, joint pain and neck pain. Negative for back pain and myalgias.  Skin: Negative for itching and rash.  Neurological: Negative for dizziness, tingling, tremors, sensory change, speech change, focal weakness, seizures, loss of consciousness, weakness and headaches.  Endo/Heme/Allergies: Negative for environmental allergies and polydipsia. Bruises/bleeds easily.  Psychiatric/Behavioral: Negative.     Blood pressure (!) 130/59, pulse 67, resp. rate 12, height 5\' 5"  (1.651 m), weight 129.3 kg, SpO2 96 %. Physical Exam  Constitutional: She is oriented to person, place, and time. She appears well-developed and well-nourished.  HENT:  Head: Normocephalic. Head is with laceration.  Eyes: Pupils are equal, round, and reactive to light. Conjunctivae and EOM  are normal.  Neck: Normal range of motion. Neck supple.  Cardiovascular: Normal rate. An irregularly irregular rhythm present.  Respiratory: Effort normal. No respiratory distress.  GI: Soft. She exhibits no distension.  Musculoskeletal: Normal range of motion.  Neurological: She is alert and oriented to person, place, and time. No cranial nerve deficit.  Skin: Skin is warm.  Psychiatric: She has a normal mood and affect. Her behavior is normal. Judgment and thought content normal.     Assessment/Plan Patient with small volume subarachnoid hemorrhage in the cerebral sulci bilaterally and trace subdural hematoma along the falx following a fall. K-centra administered in the emergency department. Patient will be admitted to stepdown unit for neurological monitoring and a follow up scan on 12/10/2018 at noon.  Floreen ComberMeghan D , NP 12/09/2018, 9:32 PM

## 2018-12-09 NOTE — ED Notes (Signed)
Attempted to give report and secretary said she has to talk to charge nurse and she'll call right back

## 2018-12-10 ENCOUNTER — Other Ambulatory Visit: Payer: Self-pay

## 2018-12-10 ENCOUNTER — Encounter (HOSPITAL_COMMUNITY): Payer: Self-pay

## 2018-12-10 ENCOUNTER — Inpatient Hospital Stay (HOSPITAL_COMMUNITY): Payer: Medicare Other

## 2018-12-10 LAB — BASIC METABOLIC PANEL
Anion gap: 12 (ref 5–15)
BUN: 18 mg/dL (ref 8–23)
CO2: 25 mmol/L (ref 22–32)
Calcium: 9.1 mg/dL (ref 8.9–10.3)
Chloride: 104 mmol/L (ref 98–111)
Creatinine, Ser: 1.29 mg/dL — ABNORMAL HIGH (ref 0.44–1.00)
GFR calc Af Amer: 47 mL/min — ABNORMAL LOW (ref >60)
GFR calc non Af Amer: 40 mL/min — ABNORMAL LOW (ref >60)
Glucose, Bld: 109 mg/dL — ABNORMAL HIGH (ref 70–99)
Potassium: 4 mmol/L (ref 3.5–5.1)
Sodium: 141 mmol/L (ref 135–145)

## 2018-12-10 LAB — CBC
HCT: 41 % (ref 36.0–46.0)
Hemoglobin: 13.2 g/dL (ref 12.0–15.0)
MCH: 28 pg (ref 26.0–34.0)
MCHC: 32.2 g/dL (ref 30.0–36.0)
MCV: 86.9 fL (ref 80.0–100.0)
Platelets: 260 10*3/uL (ref 150–400)
RBC: 4.72 MIL/uL (ref 3.87–5.11)
RDW: 13.5 % (ref 11.5–15.5)
WBC: 14.2 10*3/uL — ABNORMAL HIGH (ref 4.0–10.5)
nRBC: 0 % (ref 0.0–0.2)

## 2018-12-10 LAB — PROTIME-INR
INR: 1.5 — ABNORMAL HIGH (ref 0.8–1.2)
Prothrombin Time: 18.2 seconds — ABNORMAL HIGH (ref 11.4–15.2)

## 2018-12-10 NOTE — Evaluation (Signed)
Physical Therapy Evaluation Patient Details Name: [name] MRN: [id number] DOB: 1945/02/21 Today's Date: 12/10/2018   History of Present Illness  [name] has a history significant for coronary artery disease, hyperlipidemia, hypertension, paroxysmal atrial fibrillation (on Coumadin), and renal insufficiency. [name] admitted with a fall down the steps in which she hit her head. CT showing a small volume subarachnoid hemorrhage in the cerebral sulci bilaterally and trace subdural hematoma along the falx.  Clinical Impression  [name] admitted with above. Prior to admission, [name] lives alone with good family support and is independent with mobility/ADL's. On [name] evaluation, [name] requiring min guard assist to stand, able to take a couple of steps with a walker, but then requesting to lie back down. Positive for dizziness, nausea, and vomiting during examination. BP stable. No nystagmus noted during smooth pursuits however per RN apparent during rolling prior. Suspect steady progress once less symptomatic. See below for recommendations.     Follow Up Recommendations Home health [name];Supervision/Assistance - 24 hour    Equipment Recommendations  Rolling walker with 5" wheels    Recommendations for Other Services OT consult     Precautions / Restrictions Precautions Precautions: Fall Restrictions Weight Bearing Restrictions: No      Mobility  Bed Mobility Overal bed mobility: Needs Assistance Bed Mobility: Supine to Sit;Sit to Supine     Supine to sit: Min guard Sit to supine: Min assist   General bed mobility comments: Use of bed rail, increased time and effort. no physical assist required to sit up, minA for leg negotiation back into bed  Transfers Overall transfer level: Needs assistance Equipment used: Rolling walker (2 wheeled) Transfers: Sit to/from Stand Sit to Stand: Min guard         General transfer comment: Min guard to stand from edge of  bed  Ambulation/Gait Ambulation/Gait assistance: Min guard Gait Distance (Feet): 3 Feet Assistive device: Rolling walker (2 wheeled) Gait Pattern/deviations: Step-through pattern;Decreased stride length     General Gait Details: Taking a couple steps forward and back, limited by dizziness and nausea  Stairs            Wheelchair Mobility    Modified Rankin (Stroke Patients Only) Modified Rankin (Stroke Patients Only) Pre-Morbid Rankin Score: No symptoms Modified Rankin: Moderately severe disability     Balance Overall balance assessment: Mild deficits observed, not formally tested                                           Pertinent Vitals/Pain Pain Assessment: Faces Faces Pain Scale: Hurts little more Pain Location: posterior head Pain Descriptors / Indicators: Aching Pain Intervention(s): Limited activity within patient's tolerance;Monitored during session;Premedicated before session    Home Living Family/patient expects to be discharged to:: Private residence Living Arrangements: Alone Available Help at Discharge: Family;Available PRN/intermittently Type of Home: House Home Access: Stairs to enter   [employer] of Steps: 4 Home Layout: Able to live on main level with bedroom/bathroom Home Equipment: Shower seat;Cane - single point      Prior Function Level of Independence: Independent               Hand Dominance        Extremity/Trunk Assessment   Upper Extremity Assessment Upper Extremity Assessment: Overall WFL for tasks assessed    Lower Extremity Assessment Lower Extremity Assessment: Overall WFL for tasks assessed  Cervical / Trunk Assessment Cervical / Trunk Assessment: Normal  Communication   Communication: No difficulties  Cognition Arousal/Alertness: Awake/alert Behavior During Therapy: WFL for tasks assessed/performed Overall Cognitive Status: Within Functional Limits for tasks assessed                                         General Comments      Exercises     Assessment/Plan    [name] Assessment Patient needs continued [name] services  [name] Problem List Decreased activity tolerance;Decreased balance;Decreased mobility       [name] Treatment Interventions DME instruction;Gait training;Stair training;Functional mobility training;Therapeutic activities;Therapeutic exercise;Balance training;Patient/family education    [name] Goals (Current goals can be found in the Care Plan section)  Acute Rehab [name] Goals Patient Stated Goal: "go home." [name] Goal Formulation: With patient Time For Goal Achievement: 12/24/18 Potential to Achieve Goals: Good    Frequency Min 4X/week   Barriers to discharge        Co-evaluation               AM-PAC [name] "6 Clicks" Mobility  Outcome Measure Help needed turning from your back to your side while in a flat bed without using bedrails?: None Help needed moving from lying on your back to sitting on the side of a flat bed without using bedrails?: A Little Help needed moving to and from a bed to a chair (including a wheelchair)?: A Little Help needed standing up from a chair using your arms (e.g., wheelchair or bedside chair)?: A Little Help needed to walk in hospital room?: A Little Help needed climbing 3-5 steps with a railing? : A Lot 6 Click Score: 18    End of Session Equipment Utilized During Treatment: Gait belt Activity Tolerance: Other (comment)(nausea, vomiting, dizziness) Patient left: in bed;with call bell/phone within reach;with family/visitor present Nurse Communication: Mobility status [name] Visit Diagnosis: Dizziness and giddiness (R42);Difficulty in walking, not elsewhere classified (R26.2)    Time: 0454-0981 [name] Time Calculation (min) (ACUTE ONLY): 28 min   Charges:   [name] Evaluation $[name] Eval Moderate Complexity: 1 Mod [name] Treatments $Therapeutic Activity: 8-22 mins        Ellamae Sia, [name], DPT Acute  Rehabilitation Services Pager [phone] Office [phone]   Willy Eddy 12/10/2018, 5:31 PM

## 2018-12-10 NOTE — Progress Notes (Signed)
Overall stable overnight.  Mild headache.  No other neurologic symptoms.  Follow-up head CT scan scheduled for this afternoon.  If scan stable then patient should be able to   Be discharged later today.

## 2018-12-10 NOTE — Plan of Care (Signed)

## 2018-12-11 LAB — PROTIME-INR
INR: 1.3 — ABNORMAL HIGH (ref 0.8–1.2)
Prothrombin Time: 15.7 seconds — ABNORMAL HIGH (ref 11.4–15.2)

## 2018-12-11 LAB — SARS CORONAVIRUS 2 (TAT 6-24 HRS): SARS Coronavirus 2: NEGATIVE

## 2018-12-11 LAB — MRSA PCR SCREENING: MRSA by PCR: NEGATIVE

## 2018-12-11 NOTE — Discharge Summary (Signed)
Physician Discharge Summary    Providing Compassionate, Quality Care - Together   Patient ID: [name] MRN: [id number] DOB/AGE: November 18, 1945 73 y.o.  Admit date: 12/09/2018 Discharge date: 12/11/2018  Admission Diagnoses: Subarachnoid hemorrhage  Discharge Diagnoses:  Active Problems:   Subarachnoid hemorrhage [facility])   Discharged Condition: good  Hospital Course: Patient was admitted following a fall where she sustained a small volume subarachnoid hemorrhage in the cerebral sulci bilaterally and trace subdural hematoma along the falx following a fall. [name] was administered in the emergency department. Patient was admitted to the stepdown unit for neurological monitoring. The follow up scan on 12/10/2018 was stable.  Consults: rehabilitation medicine  Significant Diagnostic Studies: radiology: Ct Head Wo Contrast  Result Date: 12/10/2018 CLINICAL DATA:  Recent fall with intracranial hemorrhage EXAM: CT HEAD WITHOUT CONTRAST TECHNIQUE: Contiguous axial images were obtained from the base of the skull through the vertex without intravenous contrast. COMPARISON:  December 09, 2018 FINDINGS: Brain: The ventricles and sulci are within normal limits for age. There is less subarachnoid hemorrhage seen in the superior cerebral sulci bilaterally compared to 1 day prior. Only trace residual hemorrhage is seen in these areas. A small focus of subdural hematoma in the mid falx region is stable with a maximum thickness of 2 mm. There is no mass effect from this small falcine subdural hematoma. No new foci of hemorrhage are evident. There is no mass or midline shift. No acute infarct evident. Brain parenchyma appears unremarkable except for the areas of trace hemorrhage. Vascular: There is no hyperdense vessel. There is calcification in each carotid siphon region. Skull: Bony calvarium appears intact. A small exostosis arising in the left frontal region is stable. There is no associated mass effect or  edema. There is a left parietal scalp hematoma, similar to 1 day prior. Sinuses/Orbits: There is mucosal thickening in several ethmoid air cells. Visualized paranasal sinuses elsewhere clear. Visualized orbits appear symmetric bilaterally. Other: Mastoid air cells are clear. IMPRESSION: 1. Partial resolution of mild subarachnoid hemorrhage in the superior sulcal regions mild. Trace hemorrhage remains in these areas. A small midportion falcine subdural hematoma measuring 2 mm in thickness is stable. No new foci of hemorrhage. No midline shift or edema. 2.  No evident acute infarct. 3.  Left parietal scalp hematoma.  No fracture evident. 4.  Mucosal thickening in several ethmoid air cells. 5.  Foci of arterial vascular calcification noted. Electronically Signed   By: Bretta Bang III M.D.   On: 12/10/2018 12:40   Ct Head Wo Contrast  Result Date: 12/09/2018 CLINICAL DATA:  Fall.  On Coumadin. EXAM: CT HEAD WITHOUT CONTRAST CT CERVICAL SPINE WITHOUT CONTRAST TECHNIQUE: Multidetector CT imaging of the head and cervical spine was performed following the standard protocol without intravenous contrast. Multiplanar CT image reconstructions of the cervical spine were also generated. COMPARISON:  None. FINDINGS: CT HEAD FINDINGS Brain: There is small volume acute subarachnoid hemorrhage in cerebral sulci bilaterally. There is also trace subdural hematoma along the falx and possibly over the posterior left parietal convexity and along the tentorium. No acute infarct, mass, or midline shift is identified. The ventricles and sulci are normal in size for age. Vascular: Calcified atherosclerosis at the skull base. No hyperdense vessel. Skull: No fracture or suspicious osseous lesion. Sinuses/Orbits: Paranasal sinuses and mastoid air cells are clear. Right cataract extraction. Other: Large left parietal scalp hematoma with gas consistent with laceration. CT CERVICAL SPINE FINDINGS Alignment: Reversal of the normal cervical  lordosis. No significant listhesis.  Skull base and vertebrae: No fracture or suspicious osseous lesion. Soft tissues and spinal canal: No prevertebral fluid or swelling. No visible canal hematoma. Disc levels: Mild disc and moderate facet degeneration in the cervical spine. Severe right neural foraminal stenosis at C3-4 due to uncovertebral and facet spurring. Upper chest: No apical lung consolidation or mass. Other: 2.2 cm fat containing right thyroid mass, benign in appearance. Mild calcific atherosclerosis of the carotid arteries. IMPRESSION: 1. Small volume subarachnoid and subdural hemorrhage. No intracranial mass effect. 2. Large parietal scalp hematoma.  No skull fracture. 3. No cervical spine fracture. Critical Value/emergent results were called by telephone at the time of interpretation on 12/09/2018 at 8:25 pm to Dr. Charlesetta Shanks , who verbally acknowledged these results. Electronically Signed   By: Logan Bores M.D.   On: 12/09/2018 20:26   Ct Cervical Spine Wo Contrast  Result Date: 12/09/2018 CLINICAL DATA:  Fall.  On Coumadin. EXAM: CT HEAD WITHOUT CONTRAST CT CERVICAL SPINE WITHOUT CONTRAST TECHNIQUE: Multidetector CT imaging of the head and cervical spine was performed following the standard protocol without intravenous contrast. Multiplanar CT image reconstructions of the cervical spine were also generated. COMPARISON:  None. FINDINGS: CT HEAD FINDINGS Brain: There is small volume acute subarachnoid hemorrhage in cerebral sulci bilaterally. There is also trace subdural hematoma along the falx and possibly over the posterior left parietal convexity and along the tentorium. No acute infarct, mass, or midline shift is identified. The ventricles and sulci are normal in size for age. Vascular: Calcified atherosclerosis at the skull base. No hyperdense vessel. Skull: No fracture or suspicious osseous lesion. Sinuses/Orbits: Paranasal sinuses and mastoid air cells are clear. Right cataract extraction.  Other: Large left parietal scalp hematoma with gas consistent with laceration. CT CERVICAL SPINE FINDINGS Alignment: Reversal of the normal cervical lordosis. No significant listhesis. Skull base and vertebrae: No fracture or suspicious osseous lesion. Soft tissues and spinal canal: No prevertebral fluid or swelling. No visible canal hematoma. Disc levels: Mild disc and moderate facet degeneration in the cervical spine. Severe right neural foraminal stenosis at C3-4 due to uncovertebral and facet spurring. Upper chest: No apical lung consolidation or mass. Other: 2.2 cm fat containing right thyroid mass, benign in appearance. Mild calcific atherosclerosis of the carotid arteries. IMPRESSION: 1. Small volume subarachnoid and subdural hemorrhage. No intracranial mass effect. 2. Large parietal scalp hematoma.  No skull fracture. 3. No cervical spine fracture. Critical Value/emergent results were called by telephone at the time of interpretation on 12/09/2018 at 8:25 pm to Dr. Charlesetta Shanks , who verbally acknowledged these results. Electronically Signed   By: Logan Bores M.D.   On: 12/09/2018 20:26    Discharge Exam: Blood pressure 109/66, pulse 82, temperature 97.7 F (36.5 C), temperature source Oral, resp. rate 16, height 5\' 5"  (1.651 m), weight 129.3 kg, SpO2 94 %.   Constitutional: She is oriented to person, place, and time. She appears well-developed and well-nourished.  HENT:  Head: Normocephalic. Head is with laceration.  Eyes: Pupils are equal, round, and reactive to light. Conjunctivae and EOM are normal.  Neck: Normal range of motion. Neck supple.  Cardiovascular: Normal rate. An irregularly irregular rhythm present.  Respiratory: Effort normal. No respiratory distress.  GI: Soft. She exhibits no distension.  Musculoskeletal: Normal range of motion.  Neurological: She is alert and oriented to person, place, and time. No cranial nerve deficit.  Skin: Skin is warm.  Psychiatric: She has a  normal mood and affect. Her behavior is normal.  Judgment and thought content normal.   Disposition: Discharge disposition: 01-Home or Self Care        Allergies as of 12/11/2018      Reactions   Other Other (See Comments)   Most antibiotics cause yeast infections- Keflex IS tolerated   Statins Other (See Comments)   Muscle and joint pain      Medication List    STOP taking these medications   warfarin 2.5 MG tablet Commonly known as: COUMADIN     TAKE these medications   acetaminophen 500 MG tablet Commonly known as: TYLENOL Take 500-1,000 mg by mouth every 6 (six) hours as needed for headache.   cephALEXin 500 MG capsule Commonly known as: KEFLEX Take 2,000 mg by mouth See admin instructions. Take 2,000 mg by mouth one hour before dental procedures   furosemide 40 MG tablet Commonly known as: LASIX Take 1 and 1/2 tablets by mouth daily What changed:   how much to take  how to take this  when to take this  additional instructions   loratadine 10 MG tablet Commonly known as: CLARITIN Take 10 mg by mouth daily as needed for allergies or rhinitis.   magnesium oxide 400 MG tablet Commonly known as: MAG-OX Take 1 tablet (400 mg total) by mouth daily. What changed: when to take this   metoprolol tartrate 50 MG tablet Commonly known as: LOPRESSOR TAKE 1 AND 1/2 TABLETS TWICE A DAY What changed:   how much to take  how to take this  when to take this  additional instructions      Follow-up Information    Julio Sicks, MD. Schedule an appointment as soon as possible for a visit in 2 week(s).   Specialty: Neurosurgery Contact information: [address] [place] [address] [phone]           Signed: Floreen Comber 12/11/2018, 11:33 AM

## 2018-12-11 NOTE — Discharge Instructions (Signed)
Hold Coumadin for 2 weeks. Follow up in the office with Dr. Annette Stable in 2 weeks.

## 2018-12-11 NOTE — Progress Notes (Signed)
Received discharge order for patient. Discharge instructions reviewed with patient, including stopping her Coumadin for 2 weeks, diet r/t her CHF, , follow-up appointment, wound care, and any special instructions. At this time patient have stated understanding of discharge instructions. Patient with no further questions at this time. Pt stable in no acute distress. PIV removed, intact. Pt off unit in wheelchair. Nursing care complete.

## 2018-12-11 NOTE — TOC Transition Note (Addendum)
Transition of Care (TOC) - CM/SW Discharge Note Marvetta Gibbons RN, BSN Transitions of Care Unit 4NP (non trauma)- RN Case Manager [phone]   Patient Details  Name: [name] MRN: [id number] Date of Birth: 1945-12-08  Transition of Care [facility]) CM/SW Contact:  Dawayne Patricia, RN Phone Number: 12/11/2018, 12:17 PM   Clinical Narrative:    Pt stable for transition home today, orders placed for HHPTOT, CM spoke with pt at bedside, daughter also present. Discussed HH and DME needs- pt declining RW at this time. Agreeable to [facility] services- choice offered for [facility] agencies- pt states she has used [place] [facility]) in the past and her son is currently using them- list offered- Per CMS guidelines from medicare.gov website with star ratings (copy placed in shadow chart)- however pt states she would like to [facility] for her [facility] services- daughter to transport home. Call made to [place] with [facility] for [facility] referral- referral has been accepted for HHPTOT needs- address, phone # and PCP all confirmed with pt in epic.    Final next level of care: [place] Barriers to Discharge: No Barriers Identified   Patient Goals and CMS Choice Patient states their goals for this hospitalization and ongoing recovery are:: To be fully independent CMS Medicare.gov Compare Post Acute Care list provided to:: Patient Choice offered to / list presented to : Patient  Discharge Placement                   Home with [facility]    Discharge Plan and Services   Discharge Planning Services: CM Consult Post Acute Care Choice: Home Health, Durable Medical Equipment          DME Arranged: N/A DME Agency: NA       HH Arranged: PT, OT [place] Agency: [facility] [employer] Date [place]: 12/11/18 Time [place]: 1216 Representative spoke with at [place]: [place] ([place]) Interventions     Readmission  Risk Interventions Readmission Risk Prevention Plan 12/11/2018  Post Dischage Appt Complete  Medication Screening Complete  Transportation Screening Complete  Some recent data might be hidden

## 2018-12-11 NOTE — Evaluation (Signed)
Occupational Therapy Evaluation Patient Details Name: [name] MRN: [id number] DOB: 08/01/1945 Today's Date: 12/11/2018    History of Present Illness [name] has a history significant for coronary artery disease, hyperlipidemia, hypertension, paroxysmal atrial fibrillation (on Coumadin), and renal insufficiency. Pt admitted with a fall down the steps in which she hit her head. CT showing a small volume subarachnoid hemorrhage in the cerebral sulci bilaterally and trace subdural hematoma along the falx.   Clinical Impression   Pt PTA: Pt lives alone and family lives close by. Pt was independent with ADL and mobility. Pt currently performing ADL tasks in standing at sink with supervisionA and mobility with supervisionA for stability without using AD. No LOB episodes noted. Pt with slight dizziness when bending over to finish donning shoes as pt unable to mobilize her knees well for figure 4 technique. Pt able to sit upright and no nystagmus noted and BP was 152/75. Pt would benefit from continued OT skilled services in [facility] setting. OT signing off acutely.      Follow Up Recommendations  Home health OT;Supervision - Intermittent    Equipment Recommendations  None recommended by OT    Recommendations for Other Services       Precautions / Restrictions Precautions Precautions: Fall Restrictions Weight Bearing Restrictions: No      Mobility Bed Mobility Overal bed mobility: Needs Assistance Bed Mobility: Supine to Sit     Supine to sit: Supervision     General bed mobility comments: use of bed rail with increased time.  Transfers Overall transfer level: Needs assistance Equipment used: Rolling walker (2 wheeled);None Transfers: Sit to/from Stand Sit to Stand: Supervision         General transfer comment: SupervisionA for stability. Pt sat down x1 time due to dizziness.    Balance Overall balance assessment: Mild deficits observed, not formally tested                                          ADL either performed or assessed with clinical judgement   ADL Overall ADL's : At baseline                                       General ADL Comments: Pt supervisionA level for ADL and mobility. Pt requiring assist for LB dressing as pt was dizzy when bending over. Pt's mobility with supervisionA.     Vision Baseline Vision/History: No visual deficits Patient Visual Report: No change from baseline Vision Assessment?: Yes Eye Alignment: Within Functional Limits Ocular Range of Motion: Within Functional Limits Alignment/Gaze Preference: Within Defined Limits Tracking/Visual Pursuits: Able to track stimulus in all quads without difficulty     Perception     Praxis      Pertinent Vitals/Pain Pain Assessment: No/denies pain     Hand Dominance     Extremity/Trunk Assessment Upper Extremity Assessment Upper Extremity Assessment: Overall WFL for tasks assessed   Lower Extremity Assessment Lower Extremity Assessment: Overall WFL for tasks assessed   Cervical / Trunk Assessment Cervical / Trunk Assessment: Normal   Communication Communication Communication: No difficulties   Cognition Arousal/Alertness: Awake/alert Behavior During Therapy: WFL for tasks assessed/performed Overall Cognitive Status: Within Functional Limits for tasks assessed  General Comments  VSS.    Exercises     Shoulder Instructions      Home Living Family/patient expects to be discharged to:: Private residence Living Arrangements: Alone Available Help at Discharge: Family;Available PRN/intermittently Type of Home: House Home Access: Stairs to enter [employer] of Steps: 4   Home Layout: Able to live on main level with bedroom/bathroom     Bathroom Shower/Tub: Walk-in shower         Home Equipment: Shower seat;Cane - single point          Prior  Functioning/Environment Level of Independence: Independent                 OT Problem List: Decreased activity tolerance;Decreased strength;Impaired balance (sitting and/or standing)      OT Treatment/Interventions:      OT Goals(Current goals can be found in the care plan section) Acute Rehab OT Goals Patient Stated Goal: "go home." OT Goal Formulation: With patient Time For Goal Achievement: 12/25/18 Potential to Achieve Goals: Good  OT Frequency:     Barriers to D/C:            Co-evaluation              AM-PAC OT "6 Clicks" Daily Activity     Outcome Measure Help from another person eating meals?: None Help from another person taking care of personal grooming?: None Help from another person toileting, which includes using toliet, bedpan, or urinal?: None Help from another person bathing (including washing, rinsing, drying)?: A Little Help from another person to put on and taking off regular upper body clothing?: None Help from another person to put on and taking off regular lower body clothing?: A Little 6 Click Score: 22   End of Session Equipment Utilized During Treatment: Gait belt Nurse Communication: Mobility status  Activity Tolerance: Patient tolerated treatment well Patient left: in chair;with call bell/phone within reach  OT Visit Diagnosis: Unsteadiness on feet (R26.81)                Time: 1610-9604 OT Time Calculation (min): 22 min Charges:  OT General Charges $OT Visit: 1 Visit OT Evaluation $OT Eval Moderate Complexity: 1 Mod  Darryl Nestle) Marsa Aris OTR/L Acute Rehabilitation Services Pager: [phone] Office: [place] 12/11/2018, 11:47 AM

## 2018-12-12 ENCOUNTER — Telehealth: Payer: Self-pay | Admitting: Cardiology

## 2018-12-12 ENCOUNTER — Telehealth: Payer: Self-pay

## 2018-12-12 NOTE — Telephone Encounter (Signed)
Noted, discontinued anticoagulation episode of care.

## 2018-12-12 NOTE — Telephone Encounter (Signed)
Patient was D/C from hospital on 12/11/2018 and per D/C notes said to follow up with Dr Pearletha Alfred. Does patient need follow up here also? Need TCM? Let me know, thank you

## 2018-12-12 NOTE — Telephone Encounter (Signed)
I have not seen patient in over one year so I at least need to see her for follow up if not TCM hospital follow up.

## 2018-12-12 NOTE — Telephone Encounter (Signed)
Patient was just released from the hospital and states they took her off Warfarin, wanted to let the [facility] know.

## 2018-12-15 NOTE — Telephone Encounter (Signed)
Noted  

## 2018-12-15 NOTE — Telephone Encounter (Signed)
Transition Care Management Follow-up Telephone Call   Date discharged? 12/11/2018   How have you been since you were released from the hospital? Patient states she has a lot of vertigo, balance off still. No more nausea. Headache is present and taking Tylenol for the pain, pain is better every day a little bit. Home Health coming out starting tomorrow-12/16/2018.    Do you understand why you were in the hospital? yes   Do you understand the discharge instructions? yes   Where were you discharged to? Home-her daughter helped her for 2 nights but is home now.   Items Reviewed:  Medications reviewed: yes-stopped Warfarin  Allergies reviewed: yes  Dietary changes reviewed: yes  Referrals reviewed: Home Health (use to be called [facility]) starting 12/16/2018, appointment with Dr Annette Stable- 12/25/2018   Functional Questionnaire:   Activities of Daily Living (ADLs):   She states they are independent in the following: ambulation, dressing, hygiene, bathing, fixing food and medication, toileting. States they require assistance with the following: none at this time.   Any transportation issues/concerns?: No.   Any patient concerns? None at this time.   Confirmed importance and date/time of follow-up visits scheduled yes  Provider Appointment booked with Alma Friendly, NP on 12/19/2018  Confirmed with patient if condition begins to worsen call PCP or go to the ER.  Patient was given the office number and encouraged to call back with question or concerns.  : yes

## 2018-12-16 ENCOUNTER — Telehealth: Payer: Self-pay | Admitting: *Deleted

## 2018-12-16 DIAGNOSIS — S065X0D Traumatic subdural hemorrhage without loss of consciousness, subsequent encounter: Secondary | ICD-10-CM | POA: Diagnosis not present

## 2018-12-16 DIAGNOSIS — S0003XD Contusion of scalp, subsequent encounter: Secondary | ICD-10-CM | POA: Diagnosis not present

## 2018-12-16 DIAGNOSIS — I2510 Atherosclerotic heart disease of native coronary artery without angina pectoris: Secondary | ICD-10-CM | POA: Diagnosis not present

## 2018-12-16 DIAGNOSIS — M1990 Unspecified osteoarthritis, unspecified site: Secondary | ICD-10-CM | POA: Diagnosis not present

## 2018-12-16 DIAGNOSIS — I110 Hypertensive heart disease with heart failure: Secondary | ICD-10-CM | POA: Diagnosis not present

## 2018-12-16 DIAGNOSIS — N3946 Mixed incontinence: Secondary | ICD-10-CM | POA: Diagnosis not present

## 2018-12-16 DIAGNOSIS — Z96653 Presence of artificial knee joint, bilateral: Secondary | ICD-10-CM | POA: Diagnosis not present

## 2018-12-16 DIAGNOSIS — I422 Other hypertrophic cardiomyopathy: Secondary | ICD-10-CM | POA: Diagnosis not present

## 2018-12-16 DIAGNOSIS — G4733 Obstructive sleep apnea (adult) (pediatric): Secondary | ICD-10-CM | POA: Diagnosis not present

## 2018-12-16 DIAGNOSIS — M810 Age-related osteoporosis without current pathological fracture: Secondary | ICD-10-CM | POA: Diagnosis not present

## 2018-12-16 DIAGNOSIS — S066X0D Traumatic subarachnoid hemorrhage without loss of consciousness, subsequent encounter: Secondary | ICD-10-CM | POA: Diagnosis not present

## 2018-12-16 DIAGNOSIS — E7800 Pure hypercholesterolemia, unspecified: Secondary | ICD-10-CM | POA: Diagnosis not present

## 2018-12-16 DIAGNOSIS — Z9181 History of falling: Secondary | ICD-10-CM | POA: Diagnosis not present

## 2018-12-16 DIAGNOSIS — I252 Old myocardial infarction: Secondary | ICD-10-CM | POA: Diagnosis not present

## 2018-12-16 DIAGNOSIS — I4819 Other persistent atrial fibrillation: Secondary | ICD-10-CM | POA: Diagnosis not present

## 2018-12-16 DIAGNOSIS — I5032 Chronic diastolic (congestive) heart failure: Secondary | ICD-10-CM | POA: Diagnosis not present

## 2018-12-16 DIAGNOSIS — Z6841 Body Mass Index (BMI) 40.0 and over, adult: Secondary | ICD-10-CM | POA: Diagnosis not present

## 2018-12-16 NOTE — Telephone Encounter (Signed)
Patient returned phone call and she states that this is an in person visit, and she will be here for that appt. Thanks!

## 2018-12-16 NOTE — Telephone Encounter (Signed)
Approved. Will be seeing patient on 12/19/18, is she able to come in person?  I have not seen her in over one year so if she can't come in person then virtual is okay.

## 2018-12-16 NOTE — Telephone Encounter (Signed)
Anda Kraft PT with [facility] is requesting verbal orders for once a week for 6 weeks. Anda Kraft stated that the patient is recovering from a short stay at the hospital because of a fall and a small brain belled. Anda Kraft stated that the verbal orders can be left on her secure voicemail.

## 2018-12-16 NOTE — Telephone Encounter (Addendum)
Yes, it shows on the appointment that it is in office appointment but left a message to confirm.

## 2018-12-16 NOTE — Telephone Encounter (Signed)
Left message for [name] to call back.

## 2018-12-16 NOTE — Telephone Encounter (Signed)
Gave the approval to the verbal orders to [employer].

## 2018-12-16 NOTE — Telephone Encounter (Signed)
Noted  

## 2018-12-19 ENCOUNTER — Encounter: Payer: Self-pay | Admitting: Primary Care

## 2018-12-19 ENCOUNTER — Telehealth: Payer: Self-pay

## 2018-12-19 ENCOUNTER — Ambulatory Visit (INDEPENDENT_AMBULATORY_CARE_PROVIDER_SITE_OTHER): Payer: Medicare Other | Admitting: Primary Care

## 2018-12-19 ENCOUNTER — Other Ambulatory Visit: Payer: Self-pay

## 2018-12-19 VITALS — BP 140/72 | HR 84 | Temp 97.9°F | Ht 65.0 in | Wt 272.8 lb

## 2018-12-19 DIAGNOSIS — I4819 Other persistent atrial fibrillation: Secondary | ICD-10-CM

## 2018-12-19 DIAGNOSIS — I609 Nontraumatic subarachnoid hemorrhage, unspecified: Secondary | ICD-10-CM | POA: Diagnosis not present

## 2018-12-19 DIAGNOSIS — E7800 Pure hypercholesterolemia, unspecified: Secondary | ICD-10-CM | POA: Diagnosis not present

## 2018-12-19 DIAGNOSIS — Z7901 Long term (current) use of anticoagulants: Secondary | ICD-10-CM

## 2018-12-19 DIAGNOSIS — G4733 Obstructive sleep apnea (adult) (pediatric): Secondary | ICD-10-CM | POA: Diagnosis not present

## 2018-12-19 DIAGNOSIS — I5032 Chronic diastolic (congestive) heart failure: Secondary | ICD-10-CM

## 2018-12-19 DIAGNOSIS — R946 Abnormal results of thyroid function studies: Secondary | ICD-10-CM

## 2018-12-19 DIAGNOSIS — I10 Essential (primary) hypertension: Secondary | ICD-10-CM | POA: Diagnosis not present

## 2018-12-19 LAB — BASIC METABOLIC PANEL
BUN: 18 mg/dL (ref 6–23)
CO2: 31 mEq/L (ref 19–32)
Calcium: 10.1 mg/dL (ref 8.4–10.5)
Chloride: 99 mEq/L (ref 96–112)
Creatinine, Ser: 1.28 mg/dL — ABNORMAL HIGH (ref 0.40–1.20)
GFR: 40.6 mL/min — ABNORMAL LOW (ref >60.00)
Glucose, Bld: 87 mg/dL (ref 70–99)
Potassium: 4.5 mEq/L (ref 3.5–5.1)
Sodium: 139 mEq/L (ref 135–145)

## 2018-12-19 LAB — TSH: TSH: 4.06 u[IU]/mL (ref 0.35–4.50)

## 2018-12-19 LAB — LIPID PANEL
Cholesterol: 163 mg/dL (ref 0–200)
HDL: 36.3 mg/dL — ABNORMAL LOW (ref >39.00)
LDL Cholesterol: 88 mg/dL (ref 0–99)
NonHDL: 126.48
Total CHOL/HDL Ratio: 4
Triglycerides: 194 mg/dL — ABNORMAL HIGH (ref 0.0–149.0)
VLDL: 38.8 mg/dL (ref 0.0–40.0)

## 2018-12-19 LAB — T4, FREE: Free T4: 1.12 ng/dL (ref 0.60–1.60)

## 2018-12-19 NOTE — Telephone Encounter (Signed)
Yes, will follow

## 2018-12-19 NOTE — Telephone Encounter (Signed)
Notified [name] as instructed.

## 2018-12-19 NOTE — Patient Instructions (Signed)
Stop by the lab prior to leaving today. I will notify you of your results once received.   Continue off of warfarin until you see Dr. Annette Stable.  Follow up with Dr. Annette Stable as scheduled.  It was a pleasure to see you today!

## 2018-12-19 NOTE — Assessment & Plan Note (Signed)
Continue off of warfarin until evaluated by Dr. Annette Stable

## 2018-12-19 NOTE — Assessment & Plan Note (Signed)
Recent hospital admission which was reviewed. Repeat CT scan prior to discharge with improvement in bleeding.  Discussed to continue to remain off of warfarin. Neuro exam today stable. Ears cleaned out today given cerumen impaction with dizziness. Offered to xray coccyx but she kindly declines.  Follow up with Dr. Annette Stable as scheduled.  All hospital notes, labs, imaging reviewed.

## 2018-12-19 NOTE — Assessment & Plan Note (Signed)
Weight is down compared to prior office visits.  BNP and BMP reviewed. Continue furosemide 60 mg daily. Discussed to start weighing daily at home and to report a gain of >2 pounds in 24 hours and >5 pounds in one week.

## 2018-12-19 NOTE — Assessment & Plan Note (Signed)
Noted on exam today, continues off of warfarin due to recent subarachnoid hemorrhage. Continue off until evaluated by neurosurgery.

## 2018-12-19 NOTE — Progress Notes (Signed)
Subjective:    Patient ID: [name], female    DOB: 1945/04/09, 73 y.o.   MRN: [id number]  HPI  [name] is a 73 year old female who presents today for hospital follow up and general follow up.  1) Hospital Follow Up:   She presented to [facility] on 12/09/18 with a chief complaint of headache s/p fall earlier that day. She was assisting her son down the stairs when she suddenly fell backwards on the bottom stair landing on her buttocks and striking her head. EMS transported patient to the ED where she was alert and oriented x 3. ED work up consisted of CT which showed small volume subarachnoid hemorrhage in the cerebral sulci bilaterally and trace subdural hematoma. She was admitted for further evaluation and treatment.  During her hospital stay she was evaluated by neurosurgery. Her headaches improved. Repeat CT scan of head on 12/10/18 showed partial resolution of subarachnoid hemorrhage without new hemorrhage, mid line shift, or edema. She was discharged home on 12/11/18 with instructions to stop warfarin. It was recommended she see Dr. Jordan Likes in the outpatient setting for follow up.  Since her discharge home she's doing better, does have a mild headache daily that will improve with Tylenol. She endorses chronic headaches for years, this headache feels similar. She has noticed intermittent dizziness since her fall. She has an appointment with Dr. Jordan Likes next week. She does have tailbone pain that began during her hospital stay, did not undergo xray. She continues to refrain from warfarin. She was evaluated by home health PT this week, doesn't feel as though she really needs this.   2) Atrial Fibrillation: Previously managed on warfarin which was discontinued during recent hospital visit due to subarachnoid hemorrhage. Currently managed on metoprolol tartrate 50 mg BID.   Previously managed on amiodarone/tikosyn in the past, also underwent cardioversion per Dr. Johney Frame without resolve. Following  with Dr. Antoine Poche through cardiology with last visit being in August 2020. Metoprolol was increased to 75 mg BID for better rate control, warfarin was continued due to CHA2DS2-VASc score of 4. She remains off of her warfarin.  3) CAD/Hypertension/CHF/OSA: Chronic exertional dyspnea. Evaluated by cardiology in August 2020. BNP at that time of 1400, was advised to reduce salt and fluid to 48 ounces daily. Lasix was increased to 60 mg daily. TSH was noted to be elevated at 5.6, total T3 and T3 were within normal range. She endorsed not using CPAP machine.  BP Readings from Last 3 Encounters:  12/19/18 140/72  12/11/18 (!) 118/58  10/16/18 (!) 142/70   Wt Readings from Last 3 Encounters:  12/19/18 272 lb 12 oz (123.7 kg)  12/09/18 285 lb (129.3 kg)  10/16/18 275 lb (124.7 kg)   She is not weighing her self daily.   Review of Systems  Constitutional: Negative for fever and unexpected weight change.  Eyes: Negative for visual disturbance.  Respiratory:       Chronic exertional SOB  Cardiovascular: Negative for chest pain.  Neurological: Positive for light-headedness and headaches.       Past Medical History:  Diagnosis Date  . Arthritis   . CAD (coronary artery disease)    a. 07/2008 NSTEMI ->Cath: LM nl, LAD 70p/90p, 35m, LCX nl, RCA nl, EF 60%.  The LAD was stented with a 2.25x65mm Veri-Flex BMS.  b. 8/15 cath patent stent, nonobstructive disease otherwise - unchanged. Elevated troponin felt due to demand ischemia in setting of AF-RVR. c. Elevated trop 06/2014 felt due to  demand ischemia.  . Chronic diastolic CHF (congestive heart failure) ([place])    a. 07/2008 Echo: EF 60-65%, Triv AI/MR, Mild TR;  b. 09/2013 Echo: EF 60-65%, mild conc LVH.  [name] Difficult intubation   . Duodenitis   . GERD (gastroesophageal reflux disease)   . Headache(784.0)   . History of positive PPD   . Hyperlipidemia   . Hypertension   . Lactose intolerance   . Morbid obesity ([place])   . Osteoporosis   . PAF  (paroxysmal atrial fibrillation) ([place])    a. s/p multiple cardioversions;  b. 2010: on Tikosyn - dose decreased from 569mcg to 232mcg in setting of NSTEMI/QT prolongation. Did not hold NSR. c. admitted 08/2011 with loading at 52mcg BID with DCCV at that time;  d. Chronic coumadin (CHA2DS2VASc = 5). e. 06/2014: Phyllis Ginger stopped due to ineffective; started on amiodarone.  . Renal insufficiency    a. Baseline Cr appears 1.1-1.2.  . Rosacea      Social History   Socioeconomic History  . Marital status: Widowed    Spouse name: Not on file  . Number of children: 2  . Years of education: Not on file  . Highest education level: Not on file  Occupational History  . Occupation: retired    Fish farm manager: RETIRED  Social Needs  . Financial resource strain: Patient refused  . Food insecurity    Worry: Patient refused    Inability: Patient refused  . Transportation needs    Medical: Patient refused    Non-medical: Patient refused  Tobacco Use  . Smoking status: Never Smoker  . Smokeless tobacco: Never Used  Substance and Sexual Activity  . Alcohol use: No    Alcohol/week: 0.0 standard drinks  . Drug use: No  . Sexual activity: Not Currently    Birth control/protection: Post-menopausal  Lifestyle  . Physical activity    Days per week: Patient refused    Minutes per session: Patient refused  . Stress: Patient refused  Relationships  . Social Herbalist on phone: Patient refused    Gets together: Patient refused    Attends religious service: Patient refused    Active member of club or organization: Patient refused    Attends meetings of clubs or organizations: Patient refused    Relationship status: Patient refused  . Intimate partner violence    Fear of current or ex partner: Patient refused    Emotionally abused: Patient refused    Physically abused: Patient refused    Forced sexual activity: Patient refused  Other Topics Concern  . Not on file  Social History Narrative    Lives in [place], [place] w/ husband.    Past Surgical History:  Procedure Laterality Date  . ABDOMINAL HYSTERECTOMY     partial, bleeding  . CHOLECYSTECTOMY    . CORONARY ANGIOPLASTY WITH STENT PLACEMENT  2010   2.75- x 20-mm VeriFlex DES to the pLAD  . CORONARY STENT PLACEMENT    . ESOPHAGOGASTRODUODENOSCOPY  2003  . KNEE ARTHROSCOPY  2000 & 2001   left and right  . LEFT HEART CATHETERIZATION WITH CORONARY ANGIOGRAM N/A 09/24/2013   Procedure: LEFT HEART CATHETERIZATION WITH CORONARY ANGIOGRAM;  Surgeon: Blane Ohara, MD;  [facility] OK, LAD stent patent, mLAD 50-60%, CFX OK, RCA 30-40%, EF 70%  . TOTAL KNEE ARTHROPLASTY     bilateral  . TUBAL LIGATION      Family History  Problem Relation Age of Onset  . Other Mother  GI Bleed  . Pneumonia Father   . Coronary artery disease Brother   . Breast cancer Paternal Grandmother   . Rectal cancer Paternal Grandfather     Allergies  Allergen Reactions  . Other Other (See Comments)    Most antibiotics cause yeast infections- Keflex IS tolerated  . Statins Other (See Comments)    Muscle and joint pain    Current Outpatient Medications on File Prior to Visit  Medication Sig Dispense Refill  . acetaminophen (TYLENOL) 500 MG tablet Take 500-1,000 mg by mouth every 6 (six) hours as needed for headache.     . furosemide (LASIX) 40 MG tablet Take 1 and 1/2 tablets by mouth daily (Patient taking differently: Take 60 mg by mouth daily. ) 135 tablet 3  . loratadine (CLARITIN) 10 MG tablet Take 10 mg by mouth daily as needed for allergies or rhinitis.    . magnesium oxide (MAG-OX) 400 MG tablet Take 1 tablet (400 mg total) by mouth daily. (Patient taking differently: Take 400 mg by mouth at bedtime. ) 90 tablet 2  . metoprolol tartrate (LOPRESSOR) 50 MG tablet TAKE 1 AND 1/2 TABLETS TWICE A DAY (Patient taking differently: Take 75 mg by mouth 2 (two) times daily. ) 270 tablet 3  . cephALEXin (KEFLEX) 500 MG capsule Take 2,000 mg by mouth See  admin instructions. Take 2,000 mg by mouth one hour before dental procedures     No current facility-administered medications on file prior to visit.     BP 140/72   Pulse 84   Temp 97.9 F (36.6 C) (Temporal)   Ht 5\' 5"  (1.651 m)   Wt 272 lb 12 oz (123.7 kg)   SpO2 97%   BMI 45.39 kg/m    Objective:   Physical Exam  Constitutional: She appears well-nourished.  HENT:  Bilateral ear canals with cerumen impaction.  Neck: Neck supple.  Cardiovascular: Normal rate. An irregularly irregular rhythm present.  Respiratory: Effort normal and breath sounds normal.  Skin: Skin is warm and dry.  Psychiatric: She has a normal mood and affect.           Assessment & Plan:

## 2018-12-19 NOTE — Assessment & Plan Note (Signed)
Borderline high today, heart rate stable. BP during hospital stay lower.  Continue increased dose of metoprolol and furosemide. Discussed to notify cardiologist if BP runs at or above 140/90 on a consistent basis.

## 2018-12-19 NOTE — Telephone Encounter (Signed)
[name] with [facility] left v/m that pt recently discharged from hospital and [place] wants to know if [name] Bossier NP as PCP will follow pt for [facility] orders. Pt seen today for HFU.Please advise.

## 2018-12-19 NOTE — Assessment & Plan Note (Signed)
Repeat lipids pending.  

## 2018-12-19 NOTE — Assessment & Plan Note (Signed)
Not wearing CPAP machine despite recommendations.

## 2018-12-25 ENCOUNTER — Telehealth: Payer: Self-pay | Admitting: Pharmacist

## 2018-12-25 DIAGNOSIS — Z6841 Body Mass Index (BMI) 40.0 and over, adult: Secondary | ICD-10-CM | POA: Diagnosis not present

## 2018-12-25 DIAGNOSIS — S066X0A Traumatic subarachnoid hemorrhage without loss of consciousness, initial encounter: Secondary | ICD-10-CM | POA: Diagnosis not present

## 2018-12-25 DIAGNOSIS — R030 Elevated blood-pressure reading, without diagnosis of hypertension: Secondary | ICD-10-CM | POA: Diagnosis not present

## 2018-12-25 NOTE — Telephone Encounter (Signed)
Patient called clinic and stated that her warfarin was recently discontinued after having a fall in early November for which she was hospitalized. She was admitted 11/3-11/5 s/p fall and small volume subarachnoid hemorrhage. [name] was administered, follow up scan on 11/4 was stable. She states she was told to stay off of her warfarin until she saw Dr Annette Stable her neurosurgeon for follow up. States she saw him today and he advised her to resume her warfarin. [facility] and received verbal confirmation from Dr Annette Stable as well that pt is safe to resume anticoagulation.  Advised pt to resume previous dose of warfarin 1/2 tablet (1.25mg ) daily except 1 tablet (2.5mg ) on Tuesdays, Thursdays, and Saturdays. Will recheck INR in 7-10 days.

## 2018-12-30 DIAGNOSIS — I5032 Chronic diastolic (congestive) heart failure: Secondary | ICD-10-CM

## 2018-12-30 DIAGNOSIS — S0003XD Contusion of scalp, subsequent encounter: Secondary | ICD-10-CM | POA: Diagnosis not present

## 2018-12-30 DIAGNOSIS — M1990 Unspecified osteoarthritis, unspecified site: Secondary | ICD-10-CM

## 2018-12-30 DIAGNOSIS — I110 Hypertensive heart disease with heart failure: Secondary | ICD-10-CM | POA: Diagnosis not present

## 2018-12-30 DIAGNOSIS — Z96653 Presence of artificial knee joint, bilateral: Secondary | ICD-10-CM

## 2018-12-30 DIAGNOSIS — G4733 Obstructive sleep apnea (adult) (pediatric): Secondary | ICD-10-CM

## 2018-12-30 DIAGNOSIS — I4819 Other persistent atrial fibrillation: Secondary | ICD-10-CM | POA: Diagnosis not present

## 2018-12-30 DIAGNOSIS — Z6841 Body Mass Index (BMI) 40.0 and over, adult: Secondary | ICD-10-CM

## 2018-12-30 DIAGNOSIS — S065X0D Traumatic subdural hemorrhage without loss of consciousness, subsequent encounter: Secondary | ICD-10-CM | POA: Diagnosis not present

## 2018-12-30 DIAGNOSIS — N3946 Mixed incontinence: Secondary | ICD-10-CM

## 2018-12-30 DIAGNOSIS — M810 Age-related osteoporosis without current pathological fracture: Secondary | ICD-10-CM | POA: Diagnosis not present

## 2018-12-30 DIAGNOSIS — S066X0D Traumatic subarachnoid hemorrhage without loss of consciousness, subsequent encounter: Secondary | ICD-10-CM | POA: Diagnosis not present

## 2018-12-30 DIAGNOSIS — E7800 Pure hypercholesterolemia, unspecified: Secondary | ICD-10-CM

## 2018-12-30 DIAGNOSIS — I422 Other hypertrophic cardiomyopathy: Secondary | ICD-10-CM | POA: Diagnosis not present

## 2018-12-30 DIAGNOSIS — I2510 Atherosclerotic heart disease of native coronary artery without angina pectoris: Secondary | ICD-10-CM | POA: Diagnosis not present

## 2018-12-30 DIAGNOSIS — Z9181 History of falling: Secondary | ICD-10-CM

## 2018-12-30 DIAGNOSIS — I252 Old myocardial infarction: Secondary | ICD-10-CM | POA: Diagnosis not present

## 2019-01-05 ENCOUNTER — Telehealth: Payer: Self-pay | Admitting: *Deleted

## 2019-01-05 NOTE — Telephone Encounter (Signed)
Received a voicemail from the pt and she stated she needed to change her appt. Called the pt back and she stated she is sick with stuffiness and sore throat, advised that she may need to be tested for COVID-19 and she stated I had that done a month ago and she thinks it is a head cold, and that the COVID test was negative back. Advised the month ago test does not cover recently and she may need to be retested, also instructed she needs to call her PCP for advice on the symptoms since she thinks it's not COVID related and since she thinks she need meds for a cold. Advised her that getting testing for COVID would benefit her as we do not want to expose anyone if she is currently having symptoms of a cold, stuffiness, and sore throat. She stated she has already placed a call to her PCP and is awaiting a call back. Pt aware that the appt for tomorrow will be canceled and to call back with an update.

## 2019-01-06 ENCOUNTER — Encounter: Payer: Self-pay | Admitting: Primary Care

## 2019-01-06 ENCOUNTER — Ambulatory Visit (INDEPENDENT_AMBULATORY_CARE_PROVIDER_SITE_OTHER): Payer: Medicare Other | Admitting: Primary Care

## 2019-01-06 ENCOUNTER — Other Ambulatory Visit: Payer: Self-pay

## 2019-01-06 DIAGNOSIS — J069 Acute upper respiratory infection, unspecified: Secondary | ICD-10-CM | POA: Insufficient documentation

## 2019-01-06 NOTE — Progress Notes (Signed)
Subjective:    Patient ID: [name], female    DOB: [birth year], 73 y.o.   MRN: [id number]  HPI     [name] - 73 y.o. female  MRN [id number]  Date of Birth: 12/11/1945  PCP: Doreene Nest, NP  This service was provided via telemedicine. Phone Visit performed on 01/06/2019    Rationale for phone visit along with limitations reviewed. Patient consented to telephone encounter.    Location of patient: Home Location of provider: Office [place] @ [facility] Name of referring provider: N/A   Names of persons and role in encounter: Provider: Doreene Nest, NP  Patient: [name]  Other: N/A   Time on call: 6 min - 05 sec   Subjective: Chief Complaint  Patient presents with  . Nasal Congestion  . Sore Throat  . Cough     HPI:  [name] is a 73 year old female with a history of hypertension, CAD, atrial fibrillation, CHF, OSA who presents today with a chief complaint of rhinorrhea.  She also reports sore throat, post nasal drip, cough. Symptoms began two days ago. She's been taking Mucinex and Claritin with some improvement. She's taken Tylenol but hasn't had any today. She had a few family members stop by on Thanksgiving day last week, her granddaughter had a "cold" and no one was wearing masks. She denies known exposure to Covid-19, loss of taste/smell, dizziness, shortness of breath, diarrhea. She has not been out of her house in about one week.    Objective/Observations:  No physical exam or vital signs collected unless specifically identified below.   There were no vitals taken for this visit.   Respiratory status: speaks in complete sentences without evident shortness of breath.   Assessment/Plan:  Symptoms sound viral, may be coupled with allergies. Suspect she was exposed by her granddaughter on Thanksgiving, unsure if this is Covid-19 but always a possibility given then no one was wearing masks.  Discussed to resume Tylenol for fevers.  Continue Mucinex and Claritin as this has helped some. Also discussed strict ED/call back precautions including persistent fevers despite antipyretics, worsening cough, shortness of breath, etc. She verbalized understanding.  She sounds stable based off of phone visit. She will call with an update later this week.  No problem-specific Assessment & Plan notes found for this encounter.   I discussed the assessment and treatment plan with the patient. The patient was provided an opportunity to ask questions and all were answered. The patient agreed with the plan and demonstrated an understanding of the instructions.  Lab Orders  No laboratory test(s) ordered today    No orders of the defined types were placed in this encounter.   The patient was advised to call back or seek an in-person evaluation if the symptoms worsen or if the condition fails to improve as anticipated.  Doreene Nest, NP    Review of Systems  Constitutional: Positive for fever. Negative for chills.  HENT: Positive for congestion, postnasal drip and sore throat.   Respiratory: Positive for cough. Negative for shortness of breath.   Gastrointestinal: Negative for diarrhea.  Allergic/Immunologic: Positive for environmental allergies.       Past Medical History:  Diagnosis Date  . Arthritis   . CAD (coronary artery disease)    a. 07/2008 NSTEMI ->Cath: LM nl, LAD 70p/90p, 78m, LCX nl, RCA nl, EF 60%.  The LAD was stented with a 2.25x41mm Veri-Flex BMS.  b. 8/15 cath  patent stent, nonobstructive disease otherwise - unchanged. Elevated troponin felt due to demand ischemia in setting of AF-RVR. c. Elevated trop 06/2014 felt due to demand ischemia.  . Chronic diastolic CHF (congestive heart failure) (HCC)    a. 07/2008 Echo: EF 60-65%, Triv AI/MR, Mild TR;  b. 09/2013 Echo: EF 60-65%, mild conc LVH.  [name] Difficult intubation   . Duodenitis   . GERD (gastroesophageal reflux disease)   . Headache(784.0)   . History of  positive PPD   . Hyperlipidemia   . Hypertension   . Lactose intolerance   . Morbid obesity (HCC)   . Osteoporosis   . PAF (paroxysmal atrial fibrillation) (HCC)    a. s/p multiple cardioversions;  b. 2010: on Tikosyn - dose decreased from to in setting of NSTEMI/QT prolongation. Did not hold NSR. c. admitted 08/2011 with loading at BID with DCCV at that time;  d. Chronic coumadin (CHA2DS2VASc = 5). e. 06/2014: Joice Lofts stopped due to ineffective; started on amiodarone.  . Renal insufficiency    a. Baseline Cr appears 1.1-1.2.  . Rosacea      Social History   Socioeconomic History  . Marital status: Widowed    Spouse name: Not on file  . Number of children: 2  . Years of education: Not on file  . Highest education level: Not on file  Occupational History  . Occupation: retired    Associate Professor: RETIRED  Social Needs  . Financial resource strain: Patient refused  . Food insecurity    Worry: Patient refused    Inability: Patient refused  . Transportation needs    Medical: Patient refused    Non-medical: Patient refused  Tobacco Use  . Smoking status: Never Smoker  . Smokeless tobacco: Never Used  Substance and Sexual Activity  . Alcohol use: No    Alcohol/week: 0.0 standard drinks  . Drug use: No  . Sexual activity: Not Currently    Birth control/protection: Post-menopausal  Lifestyle  . Physical activity    Days per week: Patient refused    Minutes per session: Patient refused  . Stress: Patient refused  Relationships  . Social Musician on phone: Patient refused    Gets together: Patient refused    Attends religious service: Patient refused    Active member of club or organization: Patient refused    Attends meetings of clubs or organizations: Patient refused    Relationship status: Patient refused  . Intimate partner violence    Fear of current or ex partner: Patient refused    Emotionally abused: Patient refused    Physically abused:  Patient refused    Forced sexual activity: Patient refused  Other Topics Concern  . Not on file  Social History Narrative   Lives in [place], [place] w/ husband.    Past Surgical History:  Procedure Laterality Date  . ABDOMINAL HYSTERECTOMY     partial, bleeding  . CHOLECYSTECTOMY    . CORONARY ANGIOPLASTY WITH STENT PLACEMENT  2010   2.75- x 20-mm VeriFlex DES to the pLAD  . CORONARY STENT PLACEMENT    . ESOPHAGOGASTRODUODENOSCOPY  2003  . KNEE ARTHROSCOPY  2000 & 2001   left and right  . LEFT HEART CATHETERIZATION WITH CORONARY ANGIOGRAM N/A 09/24/2013   Procedure: LEFT HEART CATHETERIZATION WITH CORONARY ANGIOGRAM;  Surgeon: Micheline Chapman, MD;  [facility] [place], LAD stent patent, mLAD 50-60%, CFX [place], RCA 30-40%, EF 70%  . TOTAL KNEE ARTHROPLASTY     bilateral  .  TUBAL LIGATION      Family History  Problem Relation Age of Onset  . Other Mother        GI Bleed  . Pneumonia Father   . Coronary artery disease Brother   . Breast cancer Paternal Grandmother   . Rectal cancer Paternal Grandfather     Allergies  Allergen Reactions  . Other Other (See Comments)    Most antibiotics cause yeast infections- Keflex IS tolerated  . Statins Other (See Comments)    Muscle and joint pain    Current Outpatient Medications on File Prior to Visit  Medication Sig Dispense Refill  . acetaminophen (TYLENOL) 500 MG tablet Take 500-1,000 mg by mouth every 6 (six) hours as needed for headache.     . cephALEXin (KEFLEX) 500 MG capsule Take 2,000 mg by mouth See admin instructions. Take 2,000 mg by mouth one hour before dental procedures    . furosemide (LASIX) 40 MG tablet Take 1 and 1/2 tablets by mouth daily (Patient taking differently: Take 60 mg by mouth daily. ) 135 tablet 3  . loratadine (CLARITIN) 10 MG tablet Take 10 mg by mouth daily as needed for allergies or rhinitis.    . magnesium oxide (MAG-OX) 400 MG tablet Take 1 tablet (400 mg total) by mouth daily. (Patient taking differently: Take  400 mg by mouth at bedtime. ) 90 tablet 2  . metoprolol tartrate (LOPRESSOR) 50 MG tablet TAKE 1 AND 1/2 TABLETS TWICE A DAY (Patient taking differently: Take 75 mg by mouth 2 (two) times daily. ) 270 tablet 3  . warfarin (COUMADIN) 2.5 MG tablet Take 2.5 mg by mouth as directed. Take 1/2 to 1 tablet daily as directed by Coumadin clinic     No current facility-administered medications on file prior to visit.     BP 121/69   Pulse (!) 101   Temp (!) 100.6 F (38.1 C)   Wt 268 lb (121.6 kg)   BMI 44.60 kg/m    Objective:   Physical Exam  Constitutional: She is oriented to person, place, and time.  Respiratory: Effort normal. No respiratory distress.  Congested cough noted once during phone visit  Neurological: She is alert and oriented to person, place, and time.  Psychiatric: She has a normal mood and affect.           Assessment & Plan:

## 2019-01-06 NOTE — Assessment & Plan Note (Signed)
Symptoms sound viral, may be coupled with allergies. Suspect she was exposed by her granddaughter on Thanksgiving, unsure if this is Covid-19 but always a possibility given then no one was wearing masks.  Discussed to resume Tylenol for fevers. Continue Mucinex and Claritin as this has helped some. Also discussed strict ED/call back precautions including persistent fevers despite antipyretics, worsening cough, shortness of breath, etc. She verbalized understanding.  She sounds stable based off of phone visit. She will call with an update later this week.

## 2019-01-06 NOTE — Patient Instructions (Signed)
Continue taking Mucinex and Claritin daily.  Start Tylenol and take 500 mg every 8 hours as needed for fevers.  Please call me if your breathing gets bad, you notice increased cough, you notice persistent fevers despite Tylenol, you feel worse.  Please call and update me Friday this week.  It was a pleasure to see you today!

## 2019-01-08 ENCOUNTER — Telehealth: Payer: Self-pay

## 2019-01-08 DIAGNOSIS — J069 Acute upper respiratory infection, unspecified: Secondary | ICD-10-CM

## 2019-01-08 MED ORDER — BENZONATATE 200 MG PO CAPS: 200.0000 mg | ORAL_CAPSULE | Freq: Three times a day (TID) | ORAL | 0 refills | Status: DC | PRN

## 2019-01-08 NOTE — Telephone Encounter (Signed)
Please thank patient for the update. Glad to know she's feeling better. We can send in [facility] for her cough, these can be taken three times daily as needed. I'll send this to her pharmacy.

## 2019-01-08 NOTE — Telephone Encounter (Signed)
Patient called and left a message stating she was seen by Alma Friendly on 01/06/2019 for a cold. She is better besides having a nagging cough, feels in her throat, not able to get stuff up. Runny nose is present some but better. All other symptom are better but just nagging cough for 3 to 4 days now. Would like to get something to help with this. No fever for 2 days now.

## 2019-01-09 NOTE — Telephone Encounter (Signed)
Spoken and notified patient of Kate Clark's comments. Patient verbalized understanding.  

## 2019-02-11 ENCOUNTER — Ambulatory Visit (INDEPENDENT_AMBULATORY_CARE_PROVIDER_SITE_OTHER): Payer: Medicare Other | Admitting: *Deleted

## 2019-02-11 ENCOUNTER — Other Ambulatory Visit: Payer: Self-pay

## 2019-02-11 DIAGNOSIS — Z7901 Long term (current) use of anticoagulants: Secondary | ICD-10-CM

## 2019-02-11 DIAGNOSIS — I4891 Unspecified atrial fibrillation: Secondary | ICD-10-CM

## 2019-02-11 DIAGNOSIS — Z5181 Encounter for therapeutic drug level monitoring: Secondary | ICD-10-CM

## 2019-02-11 DIAGNOSIS — I4819 Other persistent atrial fibrillation: Secondary | ICD-10-CM

## 2019-02-11 LAB — POCT INR: INR: 2.6 (ref 2.0–3.0)

## 2019-02-11 NOTE — Patient Instructions (Addendum)
  Description   Continue to take 1/2 a tablet daily except for 1 tablet on Tuesday, Thursday and Saturday. Recheck INR in 3 weeks.  Coumadin clinic [phone].

## 2019-02-28 ENCOUNTER — Other Ambulatory Visit: Payer: Self-pay | Admitting: Cardiology

## 2019-02-28 DIAGNOSIS — I4891 Unspecified atrial fibrillation: Secondary | ICD-10-CM

## 2019-02-28 DIAGNOSIS — Z7901 Long term (current) use of anticoagulants: Secondary | ICD-10-CM

## 2019-03-04 ENCOUNTER — Other Ambulatory Visit: Payer: Self-pay

## 2019-03-04 ENCOUNTER — Ambulatory Visit (INDEPENDENT_AMBULATORY_CARE_PROVIDER_SITE_OTHER): Payer: Medicare Other

## 2019-03-04 DIAGNOSIS — I4891 Unspecified atrial fibrillation: Secondary | ICD-10-CM

## 2019-03-04 DIAGNOSIS — Z5181 Encounter for therapeutic drug level monitoring: Secondary | ICD-10-CM

## 2019-03-04 DIAGNOSIS — I4819 Other persistent atrial fibrillation: Secondary | ICD-10-CM | POA: Diagnosis not present

## 2019-03-04 DIAGNOSIS — Z7901 Long term (current) use of anticoagulants: Secondary | ICD-10-CM

## 2019-03-04 LAB — POCT INR: INR: 1.7 — AB (ref 2.0–3.0)

## 2019-03-04 NOTE — Patient Instructions (Signed)
Description   Take 1 tablet today, then resume same dosage 1/2 a tablet daily except for 1 tablet on Tuesdays, Thursdays and Saturdays. Recheck INR in 3 weeks.  Coumadin clinic [phone].

## 2019-03-25 ENCOUNTER — Ambulatory Visit (INDEPENDENT_AMBULATORY_CARE_PROVIDER_SITE_OTHER): Payer: Medicare Other

## 2019-03-25 ENCOUNTER — Other Ambulatory Visit: Payer: Self-pay

## 2019-03-25 DIAGNOSIS — Z5181 Encounter for therapeutic drug level monitoring: Secondary | ICD-10-CM

## 2019-03-25 DIAGNOSIS — I4891 Unspecified atrial fibrillation: Secondary | ICD-10-CM | POA: Diagnosis not present

## 2019-03-25 DIAGNOSIS — Z7901 Long term (current) use of anticoagulants: Secondary | ICD-10-CM

## 2019-03-25 DIAGNOSIS — I4819 Other persistent atrial fibrillation: Secondary | ICD-10-CM | POA: Diagnosis not present

## 2019-03-25 LAB — POCT INR: INR: 1.9 — AB (ref 2.0–3.0)

## 2019-03-25 NOTE — Patient Instructions (Signed)
Description   Start taking 1 tablet daily except for 1/2 tablet on Mondays, Wednesdays and Fridays.  Recheck INR in 3 weeks.  Coumadin clinic [phone].

## 2019-04-15 ENCOUNTER — Other Ambulatory Visit: Payer: Self-pay

## 2019-04-15 ENCOUNTER — Ambulatory Visit (INDEPENDENT_AMBULATORY_CARE_PROVIDER_SITE_OTHER): Payer: Medicare Other | Admitting: *Deleted

## 2019-04-15 DIAGNOSIS — I4891 Unspecified atrial fibrillation: Secondary | ICD-10-CM | POA: Diagnosis not present

## 2019-04-15 DIAGNOSIS — Z5181 Encounter for therapeutic drug level monitoring: Secondary | ICD-10-CM | POA: Diagnosis not present

## 2019-04-15 DIAGNOSIS — I4819 Other persistent atrial fibrillation: Secondary | ICD-10-CM | POA: Diagnosis not present

## 2019-04-15 DIAGNOSIS — Z7901 Long term (current) use of anticoagulants: Secondary | ICD-10-CM | POA: Diagnosis not present

## 2019-04-15 LAB — POCT INR: INR: 2.1 (ref 2.0–3.0)

## 2019-04-15 NOTE — Patient Instructions (Signed)
Description   Continue taking 1 tablet daily except for 1/2 tablet on Mondays, Wednesdays and Fridays.  Recheck INR in 4 weeks.  Coumadin clinic [phone].

## 2019-05-13 ENCOUNTER — Ambulatory Visit (INDEPENDENT_AMBULATORY_CARE_PROVIDER_SITE_OTHER): Payer: Medicare Other

## 2019-05-13 ENCOUNTER — Other Ambulatory Visit: Payer: Self-pay

## 2019-05-13 ENCOUNTER — Ambulatory Visit: Payer: Medicare Other | Admitting: Primary Care

## 2019-05-13 DIAGNOSIS — Z7901 Long term (current) use of anticoagulants: Secondary | ICD-10-CM | POA: Diagnosis not present

## 2019-05-13 DIAGNOSIS — R0609 Other forms of dyspnea: Secondary | ICD-10-CM

## 2019-05-13 DIAGNOSIS — Z5181 Encounter for therapeutic drug level monitoring: Secondary | ICD-10-CM

## 2019-05-13 DIAGNOSIS — I4819 Other persistent atrial fibrillation: Secondary | ICD-10-CM | POA: Diagnosis not present

## 2019-05-13 DIAGNOSIS — I4891 Unspecified atrial fibrillation: Secondary | ICD-10-CM | POA: Diagnosis not present

## 2019-05-13 DIAGNOSIS — I5032 Chronic diastolic (congestive) heart failure: Secondary | ICD-10-CM

## 2019-05-13 DIAGNOSIS — G4733 Obstructive sleep apnea (adult) (pediatric): Secondary | ICD-10-CM

## 2019-05-13 LAB — POCT INR: INR: 2.2 (ref 2.0–3.0)

## 2019-05-13 NOTE — Assessment & Plan Note (Signed)
Could be contributing to increased exertional dyspnea. Unfortunately patient was scheduled incorrectly and will be coming in tomorrow for an office visit with labs, ECG, vitals, etc.  Check BNP, BMP, UA, CBC.

## 2019-05-13 NOTE — Patient Instructions (Signed)
Description   Continue taking 1 tablet daily except for 1/2 tablet on Mondays, Wednesdays and Fridays.  Recheck INR in 5 weeks.  Coumadin clinic [phone].

## 2019-05-13 NOTE — Assessment & Plan Note (Signed)
Non compliant to CPAP machine, discussed importance of compliance.

## 2019-05-13 NOTE — Progress Notes (Signed)
Subjective:    Patient ID: [name], female    DOB: 06-20-1945, 74 y.o.   MRN: [id number]  HPI     [name] - 74 y.o. female  MRN [id number]  Date of Birth: [birth year]  PCP: Doreene Nest, NP  This service was provided via telemedicine. Phone Visit performed on 05/13/2019    Rationale for phone visit along with limitations reviewed. Patient consented to telephone encounter.    Location of patient: Home Location of provider: Office at [employer] @ [facility] Name of referring provider: N/A   Names of persons and role in encounter: Provider: Doreene Nest, NP  Patient: [name]  Other: N/A   Time on call: 11 min - 47 sec   Subjective: No chief complaint on file.    HPI:  [name] is a 74 year old female with a history of hypertension, CHF, coronary atherosclerosis, NSTEMI, OSA, hyperlipidemia, atrial fibrillation, cardiomyopathy who presents today with a chief complaint of dyspnea.  Symptoms of increased dyspnea that began in Fall 2020. She saw her cardiologist last in mid August 2020 who "increased my medication", she suspects this is contributing to her symptoms of increased dyspnea. Her furosemide was increased to 60 mg and metoprolol was increased to 75 mg BID.   Dyspnea occurs along with chest tightness when carrying groceries from the car. She will have to sit down twice from the car to the door. Other examples include walking out in the yard. She cannot walk to the mail box, has to ride her golf cart to the mail box. She cannot go shopping without getting short of breath.   She had a fall in November 2020, admitted to [facility] for subarachnoid hemorrhage, has some residual dizziness from that incidence.   She does not use her CPAP machine due to frequent trips to the bathroom at night, sleeps in her recliner. She is not weighing herself daily.   She denies fevers, cough. Her lower extremities will wax and wane with edema, no worse than  before. She has not yet contacted her cardiologist.   Objective/Observations:  No physical exam or vital signs collected unless specifically identified below.   There were no vitals taken for this visit.   Respiratory status: speaks in complete sentences without evident shortness of breath.   Assessment/Plan:  Speaking in complete sentences. No cough.  No problem-specific Assessment & Plan notes found for this encounter.   I discussed the assessment and treatment plan with the patient. The patient was provided an opportunity to ask questions and all were answered. The patient agreed with the plan and demonstrated an understanding of the instructions.  Lab Orders  No laboratory test(s) ordered today    No orders of the defined types were placed in this encounter.   The patient was advised to call back or seek an in-person evaluation if the symptoms worsen or if the condition fails to improve as anticipated.  Doreene Nest, NP    Review of Systems  Constitutional: Positive for fatigue. Negative for fever.  Eyes: Negative for visual disturbance.  Respiratory: Positive for chest tightness and shortness of breath. Negative for cough.   Cardiovascular: Positive for leg swelling. Negative for chest pain and palpitations.  Neurological: Positive for dizziness.       Past Medical History:  Diagnosis Date  . Arthritis   . CAD (coronary artery disease)    a. 07/2008 NSTEMI ->Cath: LM nl, LAD 70p/90p, 60m, LCX  nl, RCA nl, EF 60%.  The LAD was stented with a 2.25x14mm Veri-Flex BMS.  b. 8/15 cath patent stent, nonobstructive disease otherwise - unchanged. Elevated troponin felt due to demand ischemia in setting of AF-RVR. c. Elevated trop 06/2014 felt due to demand ischemia.  . Chronic diastolic CHF (congestive heart failure) (HCC)    a. 07/2008 Echo: EF 60-65%, Triv AI/MR, Mild TR;  b. 09/2013 Echo: EF 60-65%, mild conc LVH.  [name] Difficult intubation   . Duodenitis   . GERD  (gastroesophageal reflux disease)   . Headache(784.0)   . History of positive PPD   . Hyperlipidemia   . Hypertension   . Lactose intolerance   . Morbid obesity (HCC)   . Osteoporosis   . PAF (paroxysmal atrial fibrillation) (HCC)    a. s/p multiple cardioversions;  b. 2010: on Tikosyn - dose decreased from to in setting of NSTEMI/QT prolongation. Did not hold NSR. c. admitted 08/2011 with loading at BID with DCCV at that time;  d. Chronic coumadin (CHA2DS2VASc = 5). e. 06/2014: Joice Lofts stopped due to ineffective; started on amiodarone.  . Renal insufficiency    a. Baseline Cr appears 1.1-1.2.  . Rosacea      Social History   Socioeconomic History  . Marital status: Widowed    Spouse name: Not on file  . Number of children: 2  . Years of education: Not on file  . Highest education level: Not on file  Occupational History  . Occupation: retired    Associate Professor: RETIRED  Tobacco Use  . Smoking status: Never Smoker  . Smokeless tobacco: Never Used  Substance and Sexual Activity  . Alcohol use: No    Alcohol/week: 0.0 standard drinks  . Drug use: No  . Sexual activity: Not Currently    Birth control/protection: Post-menopausal  Other Topics Concern  . Not on file  Social History Narrative   Lives in [place], [place] w/ husband.   Social Determinants of Health   Financial Resource Strain: Unknown  . Difficulty of Paying Living Expenses: Patient refused  Food Insecurity: Unknown  . Worried About Programme researcher, broadcasting/film/video in the Last Year: Patient refused  . Ran Out of Food in the Last Year: Patient refused  Transportation Needs: Unknown  . Lack of Transportation (Medical): Patient refused  . Lack of Transportation (Non-Medical): Patient refused  Physical Activity: Unknown  . Days of Exercise per Week: Patient refused  . Minutes of Exercise per Session: Patient refused  Stress: Unknown  . Feeling of Stress : Patient refused  Social Connections: Unknown  . Frequency  of Communication with Friends and Family: Patient refused  . Frequency of Social Gatherings with Friends and Family: Patient refused  . Attends Religious Services: Patient refused  . Active Member of Clubs or Organizations: Patient refused  . Attends Banker Meetings: Patient refused  . Marital Status: Patient refused  Intimate Partner Violence: Unknown  . Fear of Current or Ex-Partner: Patient refused  . Emotionally Abused: Patient refused  . Physically Abused: Patient refused  . Sexually Abused: Patient refused    Past Surgical History:  Procedure Laterality Date  . ABDOMINAL HYSTERECTOMY     partial, bleeding  . CHOLECYSTECTOMY    . CORONARY ANGIOPLASTY WITH STENT PLACEMENT  2010   2.75- x 20-mm VeriFlex DES to the pLAD  . CORONARY STENT PLACEMENT    . ESOPHAGOGASTRODUODENOSCOPY  2003  . KNEE ARTHROSCOPY  2000 & 2001   left and right  .  LEFT HEART CATHETERIZATION WITH CORONARY ANGIOGRAM N/A 09/24/2013   Procedure: LEFT HEART CATHETERIZATION WITH CORONARY ANGIOGRAM;  Surgeon: Blane Ohara, MD;  [facility] [place], LAD stent patent, mLAD 50-60%, CFX [place], RCA 30-40%, EF 70%  . TOTAL KNEE ARTHROPLASTY     bilateral  . TUBAL LIGATION      Family History  Problem Relation Age of Onset  . Other Mother        GI Bleed  . Pneumonia Father   . Coronary artery disease Brother   . Breast cancer Paternal Grandmother   . Rectal cancer Paternal Grandfather     Allergies  Allergen Reactions  . Other Other (See Comments)    Most antibiotics cause yeast infections- Keflex IS tolerated  . Statins Other (See Comments)    Muscle and joint pain    Current Outpatient Medications on File Prior to Visit  Medication Sig Dispense Refill  . acetaminophen (TYLENOL) 500 MG tablet Take 500-1,000 mg by mouth every 6 (six) hours as needed for headache.     . furosemide (LASIX) 40 MG tablet Take 1 and 1/2 tablets by mouth daily (Patient taking differently: Take 60 mg by mouth daily. ) 135  tablet 3  . loratadine (CLARITIN) 10 MG tablet Take 10 mg by mouth daily as needed for allergies or rhinitis.    . magnesium oxide (MAG-OX) 400 MG tablet Take 1 tablet (400 mg total) by mouth daily. (Patient taking differently: Take 400 mg by mouth at bedtime. ) 90 tablet 2  . metoprolol tartrate (LOPRESSOR) 50 MG tablet TAKE 1 AND 1/2 TABLETS TWICE A DAY (Patient taking differently: Take 75 mg by mouth 2 (two) times daily. ) 270 tablet 3  . warfarin (COUMADIN) 2.5 MG tablet TAKE 1/2 TO 1 TABLET DAILY AS DIRECTED BY COUMADIN CLINIC 75 tablet 1   No current facility-administered medications on file prior to visit.    There were no vitals taken for this visit.   Objective:   Physical Exam  Constitutional: She is oriented to person, place, and time.  Respiratory: Effort normal.  No cough, speaking in complete sentences,  Neurological: She is alert and oriented to person, place, and time.  Psychiatric: She has a normal mood and affect.           Assessment & Plan:

## 2019-05-13 NOTE — Patient Instructions (Signed)
Please come to the office tomorrow at 12 pm.  It was a pleasure to see you today!

## 2019-05-13 NOTE — Assessment & Plan Note (Addendum)
Increased over the last 6 months, also with chest tightness, patient attributes symptoms to increased doses of furosemide and metoprolol.  DDx include CHF, atrial fibrillation, CAD.  Check chest xray, ECG, labs. She will be coming in tomorrow for evaluation and work up.

## 2019-05-13 NOTE — Assessment & Plan Note (Signed)
Could be contributing to increased dyspnea. Patient to come in to the office tomorrow for labs, ECG, vitals, evaluation.

## 2019-05-14 ENCOUNTER — Ambulatory Visit (INDEPENDENT_AMBULATORY_CARE_PROVIDER_SITE_OTHER): Payer: Medicare Other | Admitting: Primary Care

## 2019-05-14 ENCOUNTER — Ambulatory Visit (INDEPENDENT_AMBULATORY_CARE_PROVIDER_SITE_OTHER)
Admission: RE | Admit: 2019-05-14 | Discharge: 2019-05-14 | Disposition: A | Discharge status: Home / Self Care | Payer: Medicare Other | Source: Ambulatory Visit | Attending: Primary Care | Admitting: Primary Care

## 2019-05-14 VITALS — BP 122/70 | HR 82 | Temp 96.9°F | Ht 65.0 in | Wt 268.5 lb

## 2019-05-14 DIAGNOSIS — R0609 Other forms of dyspnea: Secondary | ICD-10-CM

## 2019-05-14 DIAGNOSIS — I5032 Chronic diastolic (congestive) heart failure: Secondary | ICD-10-CM

## 2019-05-14 DIAGNOSIS — R0600 Dyspnea, unspecified: Secondary | ICD-10-CM | POA: Diagnosis not present

## 2019-05-14 DIAGNOSIS — I10 Essential (primary) hypertension: Secondary | ICD-10-CM

## 2019-05-14 LAB — CBC
HCT: 41.9 % (ref 36.0–46.0)
Hemoglobin: 14 g/dL (ref 12.0–15.0)
MCHC: 33.4 g/dL (ref 30.0–36.0)
MCV: 83.9 fl (ref 78.0–100.0)
Platelets: 241 10*3/uL (ref 150.0–400.0)
RBC: 5 Mil/uL (ref 3.87–5.11)
RDW: 14.7 % (ref 11.5–15.5)
WBC: 7.7 10*3/uL (ref 4.0–10.5)

## 2019-05-14 LAB — TSH: TSH: 4.82 u[IU]/mL — ABNORMAL HIGH (ref 0.35–4.50)

## 2019-05-14 LAB — BASIC METABOLIC PANEL
BUN: 19 mg/dL (ref 6–23)
CO2: 30 mEq/L (ref 19–32)
Calcium: 10.1 mg/dL (ref 8.4–10.5)
Chloride: 100 mEq/L (ref 96–112)
Creatinine, Ser: 1.32 mg/dL — ABNORMAL HIGH (ref 0.40–1.20)
GFR: 39.14 mL/min — ABNORMAL LOW (ref >60.00)
Glucose, Bld: 95 mg/dL (ref 70–99)
Potassium: 3.9 mEq/L (ref 3.5–5.1)
Sodium: 139 mEq/L (ref 135–145)

## 2019-05-14 LAB — BRAIN NATRIURETIC PEPTIDE: Pro B Natriuretic peptide (BNP): 284 pg/mL — ABNORMAL HIGH (ref 0.0–100.0)

## 2019-05-14 NOTE — Patient Instructions (Signed)
Complete xray(s) and lab prior to leaving today. I will notify you of your results once received.  Start weighing yourself daily, at the same time of day, wearing the same type of clothing.  Please notify me if you have any of the following symptoms:  - Greater than 2 pound weight gain in 24 hours or greater than 5 pounds in 1 week - Increased shortness of breath, with or without a dry hacking cough  - Increased swelling in the hands, feet or stomach  - If you find that you have to sleep on extra pillows at night in order to breathe.   It was a pleasure to see you today!

## 2019-05-14 NOTE — Assessment & Plan Note (Signed)
Discussed the absolute need to start weighing daily at home. Fortunately her weight today is exactly the same as it was in December 2020. She has moderate lower extremity edema without pitting.  Check chest xray, BNP, BMP, CBC. ECG with atrial fibrillation with rate of 80. Appears similar to ECG from November of 2020.  Continue furosemide 60 mg and metoprolol.

## 2019-05-14 NOTE — Assessment & Plan Note (Signed)
DDx include CHF, atrial fibrillation, ACS, deconditioning, morbid obesity.   Exam today without pitting edema, crackles to lung bases, distress. Weight is stable.   Check chest xray, BMP, BNP, CBC. ECG today with atrial fibrillation with well controlled rate. No acute ACS.  Appears similar to November 2020.  Await labs, will cc cardiology for input.

## 2019-05-14 NOTE — Progress Notes (Signed)
Subjective:    Patient ID: [name], female    DOB: [birth year], 74 y.o.   MRN: [id number]  HPI  This visit occurred during the SARS-CoV-2 public health emergency.  Safety protocols were in place, including screening questions prior to the visit, additional usage of staff PPE, and extensive cleaning of exam room while observing appropriate contact time as indicated for disinfecting solutions.   [name] is a 74 year old female with a history of hypertension, CHF, coronary atherosclerosis, NSTEMI, OSA, subarachnoid hemorrhage, hyperlipidemia, obesity, OSA who presents today with a chief complaint of exertional dyspnea.  She was evaluated yesterday as a "phone visit" which was inappropriately scheduled. She endorsed a 5-6 month history of increased exertional dyspnea that was suspected to be secondary to an increase in her metoprolol tartrate and furosemide. Dyspnea occurs with carrying groceries from the car to her house, walking to her mail box, shopping. She has to rest several times with these activities.   Yesterday she admitted that she is not weighing herself, no recent use of CPAP machine. She denied fevers, cough, lower extremity edema. Given her symptoms she was asked to come in today for evaluation and work up. She does follow with cardiology, has not called their office.  Today she denies any acute changes to her symptoms. No chest pain, palpitations, increased lower extremity edema, cough.   BP Readings from Last 3 Encounters:  05/14/19 122/70  01/06/19 121/69  12/19/18 140/72   Wt Readings from Last 3 Encounters:  05/14/19 268 lb 8 oz (121.8 kg)  01/06/19 268 lb (121.6 kg)  12/19/18 272 lb 12 oz (123.7 kg)     Review of Systems  Constitutional: Negative for fever.  Eyes: Negative for visual disturbance.  Respiratory: Positive for shortness of breath. Negative for cough.   Cardiovascular: Positive for leg swelling. Negative for palpitations.  Gastrointestinal:  Negative for abdominal distention.       Past Medical History:  Diagnosis Date  . Arthritis   . CAD (coronary artery disease)    a. 07/2008 NSTEMI ->Cath: LM nl, LAD 70p/90p, 24m, LCX nl, RCA nl, EF 60%.  The LAD was stented with a 2.25x65mm Veri-Flex BMS.  b. 8/15 cath patent stent, nonobstructive disease otherwise - unchanged. Elevated troponin felt due to demand ischemia in setting of AF-RVR. c. Elevated trop 06/2014 felt due to demand ischemia.  . Chronic diastolic CHF (congestive heart failure) (HCC)    a. 07/2008 Echo: EF 60-65%, Triv AI/MR, Mild TR;  b. 09/2013 Echo: EF 60-65%, mild conc LVH.  [name] Difficult intubation   . Duodenitis   . GERD (gastroesophageal reflux disease)   . Headache(784.0)   . History of positive PPD   . Hyperlipidemia   . Hypertension   . Lactose intolerance   . Morbid obesity (HCC)   . Osteoporosis   . PAF (paroxysmal atrial fibrillation) (HCC)    a. s/p multiple cardioversions;  b. 2010: on Tikosyn - dose decreased from to in setting of NSTEMI/QT prolongation. Did not hold NSR. c. admitted 08/2011 with loading at BID with DCCV at that time;  d. Chronic coumadin (CHA2DS2VASc = 5). e. 06/2014: Joice Lofts stopped due to ineffective; started on amiodarone.  . Renal insufficiency    a. Baseline Cr appears 1.1-1.2.  . Rosacea      Social History   Socioeconomic History  . Marital status: Widowed    Spouse name: Not on file  . Number of children: 2  .  Years of education: Not on file  . Highest education level: Not on file  Occupational History  . Occupation: retired    Fish farm manager: RETIRED  Tobacco Use  . Smoking status: Never Smoker  . Smokeless tobacco: Never Used  Substance and Sexual Activity  . Alcohol use: No    Alcohol/week: 0.0 standard drinks  . Drug use: No  . Sexual activity: Not Currently    Birth control/protection: Post-menopausal  Other Topics Concern  . Not on file  Social History Narrative   Lives in [place], [place] w/  husband.   Social Determinants of Health   Financial Resource Strain: Unknown  . Difficulty of Paying Living Expenses: Patient refused  Food Insecurity: Unknown  . Worried About Charity fundraiser in the Last Year: Patient refused  . Ran Out of Food in the Last Year: Patient refused  Transportation Needs: Unknown  . Lack of Transportation (Medical): Patient refused  . Lack of Transportation (Non-Medical): Patient refused  Physical Activity: Unknown  . Days of Exercise per Week: Patient refused  . Minutes of Exercise per Session: Patient refused  Stress: Unknown  . Feeling of Stress : Patient refused  Social Connections: Unknown  . Frequency of Communication with Friends and Family: Patient refused  . Frequency of Social Gatherings with Friends and Family: Patient refused  . Attends Religious Services: Patient refused  . Active Member of Clubs or Organizations: Patient refused  . Attends Archivist Meetings: Patient refused  . Marital Status: Patient refused  Intimate Partner Violence: Unknown  . Fear of Current or Ex-Partner: Patient refused  . Emotionally Abused: Patient refused  . Physically Abused: Patient refused  . Sexually Abused: Patient refused    Past Surgical History:  Procedure Laterality Date  . ABDOMINAL HYSTERECTOMY     partial, bleeding  . CHOLECYSTECTOMY    . CORONARY ANGIOPLASTY WITH STENT PLACEMENT  2010   2.75- x 20-mm VeriFlex DES to the pLAD  . CORONARY STENT PLACEMENT    . ESOPHAGOGASTRODUODENOSCOPY  2003  . KNEE ARTHROSCOPY  2000 & 2001   left and right  . LEFT HEART CATHETERIZATION WITH CORONARY ANGIOGRAM N/A 09/24/2013   Procedure: LEFT HEART CATHETERIZATION WITH CORONARY ANGIOGRAM;  Surgeon: Blane Ohara, MD;  [facility] [place], LAD stent patent, mLAD 50-60%, CFX [place], RCA 30-40%, EF 70%  . TOTAL KNEE ARTHROPLASTY     bilateral  . TUBAL LIGATION      Family History  Problem Relation Age of Onset  . Other Mother        GI Bleed  .  Pneumonia Father   . Coronary artery disease Brother   . Breast cancer Paternal Grandmother   . Rectal cancer Paternal Grandfather     Allergies  Allergen Reactions  . Other Other (See Comments)    Most antibiotics cause yeast infections- Keflex IS tolerated  . Statins Other (See Comments)    Muscle and joint pain    Current Outpatient Medications on File Prior to Visit  Medication Sig Dispense Refill  . acetaminophen (TYLENOL) 500 MG tablet Take 500-1,000 mg by mouth every 6 (six) hours as needed for headache.     . furosemide (LASIX) 40 MG tablet Take 1 and 1/2 tablets by mouth daily (Patient taking differently: Take 60 mg by mouth daily. ) 135 tablet 3  . loratadine (CLARITIN) 10 MG tablet Take 10 mg by mouth daily as needed for allergies or rhinitis.    . magnesium oxide (MAG-OX) 400 MG tablet  Take 1 tablet (400 mg total) by mouth daily. (Patient taking differently: Take 400 mg by mouth at bedtime. ) 90 tablet 2  . metoprolol tartrate (LOPRESSOR) 50 MG tablet TAKE 1 AND 1/2 TABLETS TWICE A DAY (Patient taking differently: Take 75 mg by mouth 2 (two) times daily. ) 270 tablet 3  . warfarin (COUMADIN) 2.5 MG tablet TAKE 1/2 TO 1 TABLET DAILY AS DIRECTED BY COUMADIN CLINIC 75 tablet 1   No current facility-administered medications on file prior to visit.    BP 122/70   Pulse 82   Temp (!) 96.9 F (36.1 C) (Temporal)   Ht 5\' 5"  (1.651 m)   Wt 268 lb 8 oz (121.8 kg)   SpO2 98%   BMI 44.68 kg/m    Objective:   Physical Exam  Constitutional: She is oriented to person, place, and time. She appears well-nourished.  Cardiovascular: Normal rate. An irregularly irregular rhythm present.  Moderate bilateral lower extremity edema without pitting.  Respiratory: Effort normal and breath sounds normal.  No crackles  GI: She exhibits no distension.  Neurological: She is alert and oriented to person, place, and time.  Skin: Skin is warm and dry. No erythema.           Assessment  & Plan:

## 2019-05-14 NOTE — Assessment & Plan Note (Signed)
Well controlled in the office today, continue current regimen for now.

## 2019-05-15 ENCOUNTER — Other Ambulatory Visit (INDEPENDENT_AMBULATORY_CARE_PROVIDER_SITE_OTHER): Payer: Medicare Other

## 2019-05-15 DIAGNOSIS — R7989 Other specified abnormal findings of blood chemistry: Secondary | ICD-10-CM | POA: Diagnosis not present

## 2019-05-15 LAB — T4, FREE: Free T4: 0.98 ng/dL (ref 0.60–1.60)

## 2019-05-15 NOTE — Progress Notes (Signed)
I agree that that BNP is not diagnostic.  We will get her into be seen.

## 2019-05-18 NOTE — Progress Notes (Signed)
Cardiology Office Note   Date:  05/19/2019   ID:  [name], DOB 1946-01-12, MRN [id number]  PCP:  Pleas Koch, NP  Cardiologist:  Dr. Percival Spanish  No chief complaint on file.    History of Present Illness: [name] is a 74 y.o. female who presents for ongoing assessment and management of atrial fibrillation. CHADS VASC Score of 4 on coumadin. Other history includes OSA not on CPAP, CAD, chronic dyspnea, and obesity. When last seen by Dr. Percival Spanish on 09/22/2018 metoprolol was increased to 75 mg BID for better HR control.   She comes today with continued issues with breathing status.  She states that she has to stop several x1 going back and forth to her car to unload groceries.  She is very and active due to her breathing status.  She does appear to have some mild depression as well.  She denies chest pain.  Her energy level is essentially unchanged.  She has been medically compliant.  She does not wish to be placed on new CPAP as she is intolerant of this.  Past Medical History:  Diagnosis Date  . Arthritis   . CAD (coronary artery disease)    a. 07/2008 NSTEMI ->Cath: LM nl, LAD 70p/90p, 104m, LCX nl, RCA nl, EF 60%.  The LAD was stented with a 2.25x59mm Veri-Flex BMS.  b. 8/15 cath patent stent, nonobstructive disease otherwise - unchanged. Elevated troponin felt due to demand ischemia in setting of AF-RVR. c. Elevated trop 06/2014 felt due to demand ischemia.  . Chronic diastolic CHF (congestive heart failure) ([place])    a. 07/2008 Echo: EF 60-65%, Triv AI/MR, Mild TR;  b. 09/2013 Echo: EF 60-65%, mild conc LVH.  [name] Difficult intubation   . Duodenitis   . GERD (gastroesophageal reflux disease)   . Headache(784.0)   . History of positive PPD   . Hyperlipidemia   . Hypertension   . Lactose intolerance   . Morbid obesity ([place])   . Osteoporosis   . PAF (paroxysmal atrial fibrillation) ([place])    a. s/p multiple cardioversions;  b. 2010: on Tikosyn - dose decreased from 570mcg to  233mcg in setting of NSTEMI/QT prolongation. Did not hold NSR. c. admitted 08/2011 with loading at 538mcg BID with DCCV at that time;  d. Chronic coumadin (CHA2DS2VASc = 5). e. 06/2014: Phyllis Ginger stopped due to ineffective; started on amiodarone.  . Renal insufficiency    a. Baseline Cr appears 1.1-1.2.  . Rosacea     Past Surgical History:  Procedure Laterality Date  . ABDOMINAL HYSTERECTOMY     partial, bleeding  . CHOLECYSTECTOMY    . CORONARY ANGIOPLASTY WITH STENT PLACEMENT  2010   2.75- x 20-mm VeriFlex DES to the pLAD  . CORONARY STENT PLACEMENT    . ESOPHAGOGASTRODUODENOSCOPY  2003  . KNEE ARTHROSCOPY  2000 & 2001   left and right  . LEFT HEART CATHETERIZATION WITH CORONARY ANGIOGRAM N/A 09/24/2013   Procedure: LEFT HEART CATHETERIZATION WITH CORONARY ANGIOGRAM;  Surgeon: Blane Ohara, MD;  [facility] [place], LAD stent patent, mLAD 50-60%, CFX OK, RCA 30-40%, EF 70%  . TOTAL KNEE ARTHROPLASTY     bilateral  . TUBAL LIGATION       Current Outpatient Medications  Medication Sig Dispense Refill  . acetaminophen (TYLENOL) 500 MG tablet Take 500-1,000 mg by mouth every 6 (six) hours as needed for headache.     . furosemide (LASIX) 40 MG tablet Take 1 and 1/2 tablets by mouth daily (  Patient taking differently: Take 60 mg by mouth daily. ) 135 tablet 3  . loratadine (CLARITIN) 10 MG tablet Take 10 mg by mouth daily as needed for allergies or rhinitis.    . magnesium oxide (MAG-OX) 400 MG tablet Take 1 tablet (400 mg total) by mouth daily. (Patient taking differently: Take 400 mg by mouth at bedtime. ) 90 tablet 2  . metoprolol tartrate (LOPRESSOR) 50 MG tablet TAKE 1 AND 1/2 TABLETS TWICE A DAY (Patient taking differently: Take 75 mg by mouth 2 (two) times daily. ) 270 tablet 3  . warfarin (COUMADIN) 2.5 MG tablet TAKE 1/2 TO 1 TABLET DAILY AS DIRECTED BY COUMADIN CLINIC 75 tablet 1   No current facility-administered medications for this visit.    Allergies:   Other and Statins     Social History:  The patient  reports that she has never smoked. She has never used smokeless tobacco. She reports that she does not drink alcohol or use drugs.   Family History:  The patient's family history includes Breast cancer in her paternal grandmother; Coronary artery disease in her brother; Other in her mother; Pneumonia in her father; Rectal cancer in her paternal grandfather.    ROS: All other systems are reviewed and negative. Unless otherwise mentioned in H&P    PHYSICAL EXAM: VS:  BP 140/60   Pulse 83   Ht 5\' 5"  (1.651 m)   Wt 269 lb (122 kg)   SpO2 94%   BMI 44.76 kg/m  , BMI Body mass index is 44.76 kg/m. GEN: Well nourished, well developed, in no acute distress, morbidly obese HEENT: normal Neck: no JVD, carotid bruits, or masses Cardiac: Irregular RRR; no murmurs, rubs, or gallops,no edema  Respiratory:  Clear to auscultation bilaterally, normal work of breathing GI: soft, nontender, nondistended, + BS MS: no deformity or atrophy Skin: warm and dry, no rash Neuro:  Strength and sensation are intact Psych: euthymic mood, full affect   EKG: Not completed this office visit.  Recent EKG completed 1 week ago has been reviewed.  She is in atrial fibrillation with well-controlled rate.  Recent Labs: 09/22/2018: ALT 23 05/14/2019: BUN 19; Creatinine, Ser 1.32; Hemoglobin 14.0; Platelets 241.0; Potassium 3.9; Pro B Natriuretic peptide (BNP) 284.0; Sodium 139; TSH 4.82    Lipid Panel    Component Value Date/Time   CHOL 163 12/19/2018 1052   TRIG 194.0 (H) 12/19/2018 1052   HDL 36.30 (L) 12/19/2018 1052   CHOLHDL 4 12/19/2018 1052   VLDL 38.8 12/19/2018 1052   LDLCALC 88 12/19/2018 1052      Wt Readings from Last 3 Encounters:  05/19/19 269 lb (122 kg)  05/14/19 268 lb 8 oz (121.8 kg)  01/06/19 268 lb (121.6 kg)      Other studies Reviewed: Echocardiogram 11-12-2015  Left ventricle: The cavity size was normal. There was mild focal  basal  hypertrophy of the septum. Systolic function was normal.  The estimated ejection fraction was in the range of 50% to 55%.  There is hypokinesis of the apical myocardium. Left ventricular  diastolic function parameters were normal.  - Mitral valve: Calcified annulus.  - Left atrium: The atrium was mildly dilated. Volume/bsa, ES,  (1-plane Simpson&'s, A2C): 39 ml/m^2.  - Right atrium: The atrium was mildly dilated.    ASSESSMENT AND PLAN:  1.  Chronic dyspnea on exertion: I believe this is multifactorial in the setting of untreated OSA, obesity, and inactivity.  However with known history of CAD to be  thorough I will repeat a Lexiscan Myoview for evaluation of progression of ischemia.  If abnormal would recommend both the right and left heart cath to help with management.  No changes in her regimen at this time.  2.  Atrial fibrillation: Rate is controlled.  She is followed by the Coumadin clinic for PT/INR.  She did have a history of subdural hematoma but has now restarted Coumadin over the last couple of years without any recurrence of bleeding issues.  3.  Chronic diastolic CHF: She does not have evidence of volume overload today and therefore will not make any changes in her diuretic therapy.  4.  OSA: She refuses CPAP.  Should follow-up with pulmonology or PCP for ongoing management and need for inhalers if necessary.  She can also have PFTs.  However with known history of OSA compliance with CPAP would likely improve her overall symptoms.  Morbid obesity: Would recommend referral to bariatric medicine to assist with weight loss and medical management in this setting.  This will help her overall health and symptoms with known history of CAD atrial fibrillation and heart failure.  Current medicines are reviewed at length with the patient today.  I have spent 25 minutes dedicated to the care of this patient on the date of this encounter to include pre-visit review of records,  assessment, management and diagnostic testing,with shared decision making.  Labs/ tests ordered today include: Lexiscan Myoivew Bettey Mare. Liborio Nixon, ANP, AACC   05/19/2019 12:15 PM    [facility] [address] Office [phone] Fax [phone]  Notice: This dictation was prepared with Dragon dictation along with smaller phrase technology. Any transcriptional errors that result from this process are unintentional and may not be corrected upon review.

## 2019-05-19 ENCOUNTER — Other Ambulatory Visit: Payer: Self-pay

## 2019-05-19 ENCOUNTER — Encounter: Payer: Self-pay | Admitting: Cardiology

## 2019-05-19 ENCOUNTER — Encounter: Payer: Self-pay | Admitting: Adult Health

## 2019-05-19 ENCOUNTER — Ambulatory Visit (INDEPENDENT_AMBULATORY_CARE_PROVIDER_SITE_OTHER): Payer: Medicare Other | Admitting: Adult Health

## 2019-05-19 VITALS — BP 140/60 | HR 83 | Ht 65.0 in | Wt 269.0 lb

## 2019-05-19 DIAGNOSIS — G4733 Obstructive sleep apnea (adult) (pediatric): Secondary | ICD-10-CM

## 2019-05-19 DIAGNOSIS — I5032 Chronic diastolic (congestive) heart failure: Secondary | ICD-10-CM

## 2019-05-19 DIAGNOSIS — Z7901 Long term (current) use of anticoagulants: Secondary | ICD-10-CM

## 2019-05-19 DIAGNOSIS — I4811 Longstanding persistent atrial fibrillation: Secondary | ICD-10-CM | POA: Diagnosis not present

## 2019-05-19 DIAGNOSIS — I2510 Atherosclerotic heart disease of native coronary artery without angina pectoris: Secondary | ICD-10-CM

## 2019-05-19 NOTE — Patient Instructions (Signed)
Medication Instructions:  °Continue current medications ° °*If you need a refill on your cardiac medications before your next appointment, please call your pharmacy* ° ° °Lab Work: °None Ordered ° ° °Testing/Procedures: °Your physician has requested that you have a lexiscan myoview. For further information please visit [email or web address]. Please follow instruction sheet, as given. ° °Follow-Up: °At [facility], you and your health needs are our priority.  As part of our continuing mission to provide you with exceptional heart care, we have created designated Provider Care Teams.  These Care Teams include your primary Cardiologist (physician) and Advanced Practice Providers (APPs -  Physician Assistants and Nurse Practitioners) who all work together to provide you with the care you need, when you need it. ° °We recommend signing up for the patient portal called "MyChart".  Sign up information is provided on this After Visit Summary.  MyChart is used to connect with patients for Virtual Visits (Telemedicine).  Patients are able to view lab/test results, encounter notes, upcoming appointments, etc.  Non-urgent messages can be sent to your provider as well.   °To learn more about what you can do with MyChart, go to [email or web address].   ° °Your next appointment:   °1 month(s) ° °The format for your next appointment:   °In Person ° °Provider:   °James Hochrein, MD ° ° ° °

## 2019-05-29 ENCOUNTER — Telehealth (HOSPITAL_COMMUNITY): Payer: Self-pay

## 2019-05-29 NOTE — Telephone Encounter (Signed)
Encounter complete. 

## 2019-06-03 ENCOUNTER — Ambulatory Visit (HOSPITAL_COMMUNITY)
Admission: RE | Admit: 2019-06-03 | Discharge: 2019-06-03 | Disposition: A | Discharge status: Home / Self Care | Payer: Medicare Other | Source: Ambulatory Visit | Attending: Internal Medicine | Admitting: Internal Medicine

## 2019-06-03 ENCOUNTER — Other Ambulatory Visit: Payer: Self-pay

## 2019-06-03 DIAGNOSIS — I2510 Atherosclerotic heart disease of native coronary artery without angina pectoris: Secondary | ICD-10-CM

## 2019-06-03 MED ORDER — TECHNETIUM TC 99M TETROFOSMIN IV KIT
31.1000 | PACK | Freq: Once | INTRAVENOUS | Status: AC | PRN
  Administered 2019-06-03: 31.1 via INTRAVENOUS
  Filled 2019-06-03: qty 32

## 2019-06-03 MED ORDER — REGADENOSON 0.4 MG/5ML IV SOLN
0.4000 mg | Freq: Once | INTRAVENOUS | Status: AC
  Administered 2019-06-03: 0.4 mg via INTRAVENOUS

## 2019-06-03 MED ORDER — AMINOPHYLLINE 25 MG/ML IV SOLN
75.0000 mg | Freq: Once | INTRAVENOUS | Status: AC
  Administered 2019-06-03: 75 mg via INTRAVENOUS

## 2019-06-04 ENCOUNTER — Ambulatory Visit (HOSPITAL_COMMUNITY)
Admission: RE | Admit: 2019-06-04 | Discharge: 2019-06-04 | Disposition: A | Discharge status: Home / Self Care | Payer: Medicare Other | Source: Ambulatory Visit | Attending: Cardiology | Admitting: Cardiology

## 2019-06-04 MED ORDER — TECHNETIUM TC 99M TETROFOSMIN IV KIT
30.4000 | PACK | Freq: Once | INTRAVENOUS | Status: AC | PRN
  Administered 2019-06-04: 30.4 via INTRAVENOUS

## 2019-06-05 LAB — MYOCARDIAL PERFUSION IMAGING
Peak HR: 80 {beats}/min
Rest HR: 65 {beats}/min
SDS: 5
SRS: 0
SSS: 5
TID: 0.9

## 2019-06-17 ENCOUNTER — Other Ambulatory Visit: Payer: Self-pay

## 2019-06-17 ENCOUNTER — Ambulatory Visit (INDEPENDENT_AMBULATORY_CARE_PROVIDER_SITE_OTHER): Payer: Medicare Other | Admitting: *Deleted

## 2019-06-17 DIAGNOSIS — I4891 Unspecified atrial fibrillation: Secondary | ICD-10-CM | POA: Diagnosis not present

## 2019-06-17 DIAGNOSIS — Z5181 Encounter for therapeutic drug level monitoring: Secondary | ICD-10-CM

## 2019-06-17 DIAGNOSIS — I482 Chronic atrial fibrillation, unspecified: Secondary | ICD-10-CM | POA: Insufficient documentation

## 2019-06-17 DIAGNOSIS — Z7189 Other specified counseling: Secondary | ICD-10-CM | POA: Insufficient documentation

## 2019-06-17 DIAGNOSIS — Z7901 Long term (current) use of anticoagulants: Secondary | ICD-10-CM | POA: Diagnosis not present

## 2019-06-17 DIAGNOSIS — I4819 Other persistent atrial fibrillation: Secondary | ICD-10-CM | POA: Diagnosis not present

## 2019-06-17 LAB — POCT INR: INR: 2.4 (ref 2.0–3.0)

## 2019-06-17 NOTE — Patient Instructions (Signed)
Description   Continue taking 1 tablet daily except for 1/2 tablet on Mondays, Wednesdays and Fridays.  Recheck INR in 6 weeks.  Coumadin clinic [phone].

## 2019-06-17 NOTE — Progress Notes (Signed)
Cardiology Office Note   Date:  06/18/2019   ID:  [name], DOB 02-23-1945, MRN [id number]  PCP:  Doreene Nest, NP  Cardiologist:   Rollene Rotunda, MD   Chief Complaint  Patient presents with  . Shortness of Breath      History of Present Illness: [name] is a 74 y.o. female who presents for ongoing assessment and management of atrial fibrillation.  At the last visit she had increased dyspnea.  Lexiscan Myoview was negative for ischemia.  She continues to have increased dyspnea.  This happens with activity such as carrying her groceries or walking a short distance on level ground.  She sleeps up in a chair just because this is comfortable for her.  She seems to point to some of her symptoms starting when her beta-blocker was increased.  She does have morbid obesity.  She has some joint problems.  She fell in November and had a subdural hematoma and this set her back.  She is not having any new chest pressure, neck or arm discomfort.  She does not really describe palpitations, presyncope or syncope.  She tolerates anticoagulation.  She has had no new edema.   Past Medical History:  Diagnosis Date  . Arthritis   . CAD (coronary artery disease)    a. 07/2008 NSTEMI ->Cath: LM nl, LAD 70p/90p, 55m, LCX nl, RCA nl, EF 60%.  The LAD was stented with a 2.25x72mm Veri-Flex BMS.  b. 8/15 cath patent stent, nonobstructive disease otherwise - unchanged. Elevated troponin felt due to demand ischemia in setting of AF-RVR. c. Elevated trop 06/2014 felt due to demand ischemia.  . Chronic diastolic CHF (congestive heart failure) (HCC)    a. 07/2008 Echo: EF 60-65%, Triv AI/MR, Mild TR;  b. 09/2013 Echo: EF 60-65%, mild conc LVH.  [name] Difficult intubation   . Duodenitis   . GERD (gastroesophageal reflux disease)   . Headache(784.0)   . History of positive PPD   . Hyperlipidemia   . Hypertension   . Lactose intolerance   . Morbid obesity (HCC)   . Osteoporosis   . PAF (paroxysmal  atrial fibrillation) (HCC)    a. s/p multiple cardioversions;  b. 2010: on Tikosyn - dose decreased from to in setting of NSTEMI/QT prolongation. Did not hold NSR. c. admitted 08/2011 with loading at BID with DCCV at that time;  d. Chronic coumadin (CHA2DS2VASc = 5). e. 06/2014: Joice Lofts stopped due to ineffective; started on amiodarone.  . Renal insufficiency    a. Baseline Cr appears 1.1-1.2.  . Rosacea     Past Surgical History:  Procedure Laterality Date  . ABDOMINAL HYSTERECTOMY     partial, bleeding  . CHOLECYSTECTOMY    . CORONARY ANGIOPLASTY WITH STENT PLACEMENT  2010   2.75- x 20-mm VeriFlex DES to the pLAD  . CORONARY STENT PLACEMENT    . ESOPHAGOGASTRODUODENOSCOPY  2003  . KNEE ARTHROSCOPY  2000 & 2001   left and right  . LEFT HEART CATHETERIZATION WITH CORONARY ANGIOGRAM N/A 09/24/2013   Procedure: LEFT HEART CATHETERIZATION WITH CORONARY ANGIOGRAM;  Surgeon: Micheline Chapman, MD;  [facility] [place], LAD stent patent, mLAD 50-60%, CFX OK, RCA 30-40%, EF 70%  . TOTAL KNEE ARTHROPLASTY     bilateral  . TUBAL LIGATION       Current Outpatient Medications  Medication Sig Dispense Refill  . acetaminophen (TYLENOL) 500 MG tablet Take 500-1,000 mg by mouth every 6 (six) hours as needed  for headache.     . furosemide (LASIX) 40 MG tablet Take 1 and 1/2 tablets by mouth daily 135 tablet 3  . loratadine (CLARITIN) 10 MG tablet Take 10 mg by mouth daily as needed for allergies or rhinitis.    . magnesium oxide (MAG-OX) 400 MG tablet Take 1 tablet (400 mg total) by mouth daily. (Patient taking differently: Take 400 mg by mouth at bedtime. ) 90 tablet 2  . warfarin (COUMADIN) 2.5 MG tablet TAKE 1/2 TO 1 TABLET DAILY AS DIRECTED BY COUMADIN CLINIC 75 tablet 1  . diltiazem (CARDIZEM CD) 240 MG 24 hr capsule Take 1 capsule (240 mg total) by mouth daily. 90 capsule 3   No current facility-administered medications for this visit.    Allergies:   Other and Statins    ROS:   Please see the history of present illness.   Otherwise, review of systems are positive for none.   All other systems are reviewed and negative.    PHYSICAL EXAM: VS:  BP 130/84   Pulse 64   Temp (!) 97.2 F (36.2 C)   Ht 5\' 5"  (1.651 m)   Wt 269 lb 12.8 oz (122.4 kg)   SpO2 98%   BMI 44.90 kg/m  , BMI Body mass index is 44.9 kg/m. GENERAL:  Well appearing NECK:  No jugular venous distention, waveform within normal limits, carotid upstroke brisk and symmetric, no bruits, no thyromegaly LUNGS:  Clear to auscultation bilaterally CHEST:  Unremarkable HEART:  PMI not displaced or sustained,S1 and S2 within normal limits, no S3, no clicks, no rubs, no murmurs, irregular ABD:  Flat, positive bowel sounds normal in frequency in pitch, no bruits, no rebound, no guarding, no midline pulsatile mass, no hepatomegaly, no splenomegaly EXT:  2 plus pulses throughout, no edema, no cyanosis no clubbing  EKG:  EKG is not ordered today.   Recent Labs: 09/22/2018: ALT 23 05/14/2019: BUN 19; Creatinine, Ser 1.32; Hemoglobin 14.0; Platelets 241.0; Potassium 3.9; Pro B Natriuretic peptide (BNP) 284.0; Sodium 139; TSH 4.82    Lipid Panel    Component Value Date/Time   CHOL 163 12/19/2018 1052   TRIG 194.0 (H) 12/19/2018 1052   HDL 36.30 (L) 12/19/2018 1052   CHOLHDL 4 12/19/2018 1052   VLDL 38.8 12/19/2018 1052   LDLCALC 88 12/19/2018 1052      Wt Readings from Last 3 Encounters:  06/18/19 269 lb 12.8 oz (122.4 kg)  06/03/19 269 lb (122 kg)  05/19/19 269 lb (122 kg)      Other studies Reviewed: Additional studies/ records that were reviewed today include: Lexiscan Myoview.. Review of the above records demonstrates:  Please see elsewhere in the note.     ASSESSMENT AND PLAN:   Chronic dyspnea on exertion:   I think this is probably multifactorial.  It might be related to her weight and deconditioning.  There was no evidence of high-grade ischemia.  I am going to check an echocardiogram  and a BNP level.  Just to see if it is related to the beta-blocker I will stop the metoprolol and start Cardizem 240 mg daily.  She was on Cardizem in the past and did not have any particular side effect of intolerance that she recalls.  Follow-up will be based on her results and symptoms.  Atrial fibrillation:   [name] has a CHA2DS2 - VASc score of 4.     She tolerates anticoagulation.  She has failed both Tikosyn and amiodarone.  Chronic  diastolic CHF:    I think she is euvolemic but will be assessed as above.  OSA: She refuses CPAP.    Morbid obesity: This is certainly part of the problem and we could discuss bariatric referral if she would consent pending the work-up above.    Covid education:  She has been vaccinated  Current medicines are reviewed at length with the patient today.  The patient does not have concerns regarding medicines.  The following changes have been made:  no change  Labs/ tests ordered today include:   Orders Placed This Encounter  Procedures  . Brain natriuretic peptide  . ECHOCARDIOGRAM COMPLETE     Disposition:   FU with me after the above studies.     Signed, Minus Breeding, MD  06/18/2019 1:12 PM     [facility]

## 2019-06-18 ENCOUNTER — Encounter: Payer: Self-pay | Admitting: Cardiology

## 2019-06-18 ENCOUNTER — Ambulatory Visit (INDEPENDENT_AMBULATORY_CARE_PROVIDER_SITE_OTHER): Payer: Medicare Other | Admitting: Cardiology

## 2019-06-18 VITALS — BP 130/84 | HR 64 | Temp 97.2°F | Ht 65.0 in | Wt 269.8 lb

## 2019-06-18 DIAGNOSIS — I482 Chronic atrial fibrillation, unspecified: Secondary | ICD-10-CM

## 2019-06-18 DIAGNOSIS — I2510 Atherosclerotic heart disease of native coronary artery without angina pectoris: Secondary | ICD-10-CM

## 2019-06-18 DIAGNOSIS — R0600 Dyspnea, unspecified: Secondary | ICD-10-CM | POA: Diagnosis not present

## 2019-06-18 DIAGNOSIS — Z7189 Other specified counseling: Secondary | ICD-10-CM | POA: Diagnosis not present

## 2019-06-18 DIAGNOSIS — I5032 Chronic diastolic (congestive) heart failure: Secondary | ICD-10-CM

## 2019-06-18 DIAGNOSIS — R0609 Other forms of dyspnea: Secondary | ICD-10-CM

## 2019-06-18 MED ORDER — DILTIAZEM HCL ER COATED BEADS 240 MG PO CP24
240.0000 mg | ORAL_CAPSULE | Freq: Every day | ORAL | 3 refills | Status: DC
Start: 2019-06-18 — End: 2019-07-27

## 2019-06-18 MED ORDER — FUROSEMIDE 40 MG PO TABS: ORAL_TABLET | ORAL | 3 refills | Status: DC

## 2019-06-18 NOTE — Patient Instructions (Addendum)
Medication Instructions:  DISCONTINUE METOPROLOL START CARDIZEM 240MG  DAILY LASIX REFILLED *If you need a refill on your cardiac medications before your next appointment, please call your pharmacy*  Lab Work: Your physician recommends that you return for lab work today (BNP) If you have labs (blood work) drawn today and your tests are completely normal, you will receive your results only by: MyChart Message (if you have MyChart) OR . A paper copy in the mail If you have any lab test that is abnormal or we need to change your treatment, we will call you to review the results.  Testing/Procedures: Your physician has requested that you have an echocardiogram. Echocardiography is a painless test that uses sound waves to create images of your heart. It provides your doctor with information about the size and shape of your heart and how well your heart's chambers and valves are working. This procedure takes approximately one hour. There are no restrictions for this procedure. [address]  Follow-Up: At [facility], you and your health needs are our priority.  As part of our continuing mission to provide you with exceptional heart care, we have created designated Provider Care Teams.  These Care Teams include your primary Cardiologist (physician) and Advanced Practice Providers (APPs -  Physician Assistants and Nurse Practitioners) who all work together to provide you with the care you need, when you need it.  Your next appointment:   1 month(s)  The format for your next appointment:   In Person  Provider:   [facility], MD

## 2019-06-19 LAB — BRAIN NATRIURETIC PEPTIDE: BNP: 223 pg/mL — ABNORMAL HIGH (ref 0.0–100.0)

## 2019-06-23 ENCOUNTER — Telehealth: Payer: Self-pay | Admitting: Cardiology

## 2019-06-23 NOTE — Telephone Encounter (Signed)
Called patient 06/23/19 to schedule appointment, left message 

## 2019-07-10 ENCOUNTER — Other Ambulatory Visit: Payer: Self-pay

## 2019-07-10 ENCOUNTER — Ambulatory Visit (HOSPITAL_COMMUNITY): Payer: Medicare Other | Attending: Cardiology

## 2019-07-10 DIAGNOSIS — R0600 Dyspnea, unspecified: Secondary | ICD-10-CM | POA: Insufficient documentation

## 2019-07-10 DIAGNOSIS — R0609 Other forms of dyspnea: Secondary | ICD-10-CM

## 2019-07-10 MED ORDER — PERFLUTREN LIPID MICROSPHERE
1.0000 mL | INTRAVENOUS | Status: AC | PRN
  Administered 2019-07-10: 2 mL via INTRAVENOUS

## 2019-07-26 DIAGNOSIS — I517 Cardiomegaly: Secondary | ICD-10-CM | POA: Insufficient documentation

## 2019-07-26 NOTE — Progress Notes (Signed)
Cardiology Office Note   Date:  07/27/2019   ID:  [name], DOB [birth year], MRN [id number]  PCP:  Doreene Nest, NP  Cardiologist:   Rollene Rotunda, MD   Chief Complaint  Patient presents with   Shortness of Breath      History of Present Illness: [name] is a 74 y.o. female who presents for ongoing assessment and management of atrial fibrillation.  At the last visit she had increased dyspnea.  Lexiscan Myoview was negative for ischemia.   Echo demonstrated severe LVH.  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   BNP was only mildly increased.   I did start Cardizem at the last visit.  She does not think her breathing is any better.  Comes in spells where she gets short of breath.  She is watching her salt but not probably as strictly as I would like.  Her weights have come down from her max of 285 late last year but unfortunately her breathing is not much better.  She is not describing new PND or orthopnea though she chronically sleeps in a recliner because of dizziness.  She denies any presyncope or syncope.  She is not having any new chest pressure, neck or arm discomfort.   Past Medical History:  Diagnosis Date   Arthritis    CAD (coronary artery disease)    a. 07/2008 NSTEMI ->Cath: LM nl, LAD 70p/90p, 42m, LCX nl, RCA nl, EF 60%.  The LAD was stented with a 2.25x47mm Veri-Flex BMS.  b. 8/15 cath patent stent, nonobstructive disease otherwise - unchanged. Elevated troponin felt due to demand ischemia in setting of AF-RVR. c. Elevated trop 06/2014 felt due to demand ischemia.   Chronic diastolic CHF (congestive heart failure) (HCC)    a. 07/2008 Echo: EF 60-65%, Triv AI/MR, Mild TR;  b. 09/2013 Echo: EF 60-65%, mild conc LVH.   Difficult intubation    Duodenitis    GERD (gastroesophageal reflux disease)    Headache(784.0)    History of positive PPD    Hyperlipidemia    Hypertension    Lactose intolerance    Morbid obesity (HCC)    Osteoporosis      PAF (paroxysmal atrial fibrillation) (HCC)    a. s/p multiple cardioversions;  b. 2010: on Tikosyn - dose decreased from to in setting of NSTEMI/QT prolongation. Did not hold NSR. c. admitted 08/2011 with loading at BID with DCCV at that time;  d. Chronic coumadin (CHA2DS2VASc = 5). e. 06/2014: Joice Lofts stopped due to ineffective; started on amiodarone.   Renal insufficiency    a. Baseline Cr appears 1.1-1.2.   Rosacea     Past Surgical History:  Procedure Laterality Date   ABDOMINAL HYSTERECTOMY     partial, bleeding   CHOLECYSTECTOMY     CORONARY ANGIOPLASTY WITH STENT PLACEMENT  2010   2.75- x 20-mm VeriFlex DES to the pLAD   CORONARY STENT PLACEMENT     ESOPHAGOGASTRODUODENOSCOPY  2003   KNEE ARTHROSCOPY  2000 & 2001   left and right   LEFT HEART CATHETERIZATION WITH CORONARY ANGIOGRAM N/A 09/24/2013   Procedure: LEFT HEART CATHETERIZATION WITH CORONARY ANGIOGRAM;  Surgeon: Micheline Chapman, MD;  [facility] [place], LAD stent patent, mLAD 50-60%, CFX [place], RCA 30-40%, EF 70%   TOTAL KNEE ARTHROPLASTY     bilateral   TUBAL LIGATION       Current Outpatient Medications  Medication Sig Dispense Refill   acetaminophen (TYLENOL) 500 MG  tablet Take 500-1,000 mg by mouth every 6 (six) hours as needed for headache.      diltiazem (CARDIZEM CD) 300 MG 24 hr capsule Take 1 capsule (300 mg total) by mouth daily. 90 capsule 3   furosemide (LASIX) 40 MG tablet Take 1 tablet (40 mg total) by mouth 2 (two) times daily. 180 tablet 3   loratadine (CLARITIN) 10 MG tablet Take 10 mg by mouth daily as needed for allergies or rhinitis.     magnesium oxide (MAG-OX) 400 MG tablet Take 1 tablet (400 mg total) by mouth daily. (Patient taking differently: Take 400 mg by mouth at bedtime. ) 90 tablet 2   warfarin (COUMADIN) 2.5 MG tablet TAKE 1/2 TO 1 TABLET DAILY AS DIRECTED BY COUMADIN CLINIC 75 tablet 1   No current facility-administered medications for this visit.     Allergies:   Other and Statins    ROS:  Please see the history of present illness.   Otherwise, review of systems are positive for none.   All other systems are reviewed and negative.    PHYSICAL EXAM: VS:  BP (!) 158/72    Pulse 93    Ht 5\' 5"  (1.651 m)    Wt 265 lb (120.2 kg)    SpO2 95%    BMI 44.10 kg/m  , BMI Body mass index is 44.1 kg/m. GENERAL:  Well appearing NECK:  No jugular venous distention, waveform within normal limits, carotid upstroke brisk and symmetric, no bruits, no thyromegaly LUNGS:  Clear to auscultation bilaterally CHEST:  Unremarkable HEART:  PMI not displaced or sustained,S1 and S2 within normal limits, no S3,  no clicks, no rubs, no murmurs, irregular  ABD:  Flat, positive bowel sounds normal in frequency in pitch, no bruits, no rebound, no guarding, no midline pulsatile mass, no hepatomegaly, no splenomegaly EXT:  2 plus pulses throughout, no edema, no cyanosis no clubbing     EKG:  EKG is not ordered today.   Recent Labs: 09/22/2018: ALT 23 05/14/2019: BUN 19; Creatinine, Ser 1.32; Hemoglobin 14.0; Platelets 241.0; Potassium 3.9; Pro B Natriuretic peptide (BNP) 284.0; Sodium 139; TSH 4.82 06/18/2019: BNP 223.0    Lipid Panel    Component Value Date/Time   CHOL 163 12/19/2018 1052   TRIG 194.0 (H) 12/19/2018 1052   HDL 36.30 (L) 12/19/2018 1052   CHOLHDL 4 12/19/2018 1052   VLDL 38.8 12/19/2018 1052   LDLCALC 88 12/19/2018 1052      Wt Readings from Last 3 Encounters:  07/27/19 265 lb (120.2 kg)  06/18/19 269 lb 12.8 oz (122.4 kg)  06/03/19 269 lb (122 kg)      Other studies Reviewed: Additional studies/ records that were reviewed today include: echo, labs Review of the above records demonstrates:  Please see elsewhere in the note.     ASSESSMENT AND PLAN:   Chronic dyspnea on exertion:     She may have some hypertrophic cardiomyopathy.  I Minna check an MRI to evaluate this.  She may need genetic testing.  At this point I am not  convinced that this is an infiltrative cardiomyopathy.  I am going to increase her Cardizem to 300 mg daily.  I am going to increase her diuretic to 80 mg daily and check a basic metabolic profile in 2 weeks.  I reiterated the need for salt restriction.  Atrial fibrillation:   [name] has a CHA2DS2 - VASc score of 4.  This will be managed as above.  She failed both Tikosyn and amiodarone in the past.  She tolerates anticoagulation.  Chronic diastolic CHF:    As above.  The BNP was mildly elevated previously.  OSA: She refuses CPAP.     Morbid obesity:     Certainly her breathing is multifactorial and her morbid obesity is part of this.  We will continue education about this.  Covid education:  She has been vaccinated  Current medicines are reviewed at length with the patient today.  The patient does not have concerns regarding medicines.  The following changes have been made: As above  Labs/ tests ordered today include:    Orders Placed This Encounter  Procedures   MR Card Morphology W/O Cm   Basic metabolic panel     Disposition:   FU with me after the MRI.    Signed, Rollene Rotunda, MD  07/27/2019 11:57 AM    [facility]

## 2019-07-27 ENCOUNTER — Other Ambulatory Visit: Payer: Self-pay

## 2019-07-27 ENCOUNTER — Encounter: Payer: Self-pay | Admitting: Cardiology

## 2019-07-27 ENCOUNTER — Ambulatory Visit (INDEPENDENT_AMBULATORY_CARE_PROVIDER_SITE_OTHER): Payer: Medicare Other | Admitting: Cardiology

## 2019-07-27 VITALS — BP 158/72 | HR 93 | Ht 65.0 in | Wt 265.0 lb

## 2019-07-27 DIAGNOSIS — R0600 Dyspnea, unspecified: Secondary | ICD-10-CM

## 2019-07-27 DIAGNOSIS — I517 Cardiomegaly: Secondary | ICD-10-CM

## 2019-07-27 DIAGNOSIS — I482 Chronic atrial fibrillation, unspecified: Secondary | ICD-10-CM | POA: Diagnosis not present

## 2019-07-27 DIAGNOSIS — I421 Obstructive hypertrophic cardiomyopathy: Secondary | ICD-10-CM | POA: Diagnosis not present

## 2019-07-27 DIAGNOSIS — I2510 Atherosclerotic heart disease of native coronary artery without angina pectoris: Secondary | ICD-10-CM | POA: Diagnosis not present

## 2019-07-27 DIAGNOSIS — R0609 Other forms of dyspnea: Secondary | ICD-10-CM

## 2019-07-27 MED ORDER — DILTIAZEM HCL ER COATED BEADS 300 MG PO CP24: 300.0000 mg | ORAL_CAPSULE | Freq: Every day | ORAL | 3 refills | Status: DC

## 2019-07-27 MED ORDER — FUROSEMIDE 40 MG PO TABS: 40.0000 mg | ORAL_TABLET | Freq: Two times a day (BID) | ORAL | 3 refills | Status: DC

## 2019-07-27 NOTE — Patient Instructions (Signed)
Medication Instructions:  INCREASE CARDIZEM TO 300MG  DAILY INCREASE LASIX TO 40MG  TWICE A DAY *If you need a refill on your cardiac medications before your next appointment, please call your pharmacy*  Lab Work: Your physician recommends that you return for lab work in 2 WEEKS (BMP) If you have labs (blood work) drawn today and your tests are completely normal, you will receive your results only by: MyChart Message (if you have MyChart) OR . A paper copy in the mail If you have any lab test that is abnormal or we need to change your treatment, we will call you to review the results.  Testing/Procedures: MRI examination of the heart - pending insurance  Follow-Up: At [facility], you and your health needs are our priority.  As part of our continuing mission to provide you with exceptional heart care, we have created designated Provider Care Teams.  These Care Teams include your primary Cardiologist (physician) and Advanced Practice Providers (APPs -  Physician Assistants and Nurse Practitioners) who all work together to provide you with the care you need, when you need it.  Your next appointment:   FOLLOW UP APPOINTMENT AFTER MRI

## 2019-07-28 ENCOUNTER — Encounter: Payer: Self-pay | Admitting: Cardiology

## 2019-07-28 ENCOUNTER — Telehealth: Payer: Self-pay | Admitting: Cardiology

## 2019-07-28 NOTE — Telephone Encounter (Signed)
Spoke with patient regarding appointment for Cardiac MRI scheduled 08/26/19 at 9:00 am at [facility]----arrival time is 8:15 am 1st floor admissions office.  Will mail information to patient and it is also in My Chart.  Patient voiced her understanding.

## 2019-07-29 ENCOUNTER — Ambulatory Visit (INDEPENDENT_AMBULATORY_CARE_PROVIDER_SITE_OTHER): Payer: Medicare Other

## 2019-07-29 ENCOUNTER — Other Ambulatory Visit: Payer: Self-pay

## 2019-07-29 DIAGNOSIS — Z7901 Long term (current) use of anticoagulants: Secondary | ICD-10-CM

## 2019-07-29 DIAGNOSIS — I4891 Unspecified atrial fibrillation: Secondary | ICD-10-CM

## 2019-07-29 DIAGNOSIS — I4819 Other persistent atrial fibrillation: Secondary | ICD-10-CM

## 2019-07-29 DIAGNOSIS — Z5181 Encounter for therapeutic drug level monitoring: Secondary | ICD-10-CM

## 2019-07-29 LAB — POCT INR: INR: 2.4 (ref 2.0–3.0)

## 2019-07-29 NOTE — Patient Instructions (Signed)
Description   Continue taking 1 tablet daily except for 1/2 tablet on Mondays, Wednesdays and Fridays.  Recheck INR in 6 weeks.  Coumadin clinic [phone].     

## 2019-08-11 LAB — BASIC METABOLIC PANEL
BUN/Creatinine Ratio: 14 (ref 12–28)
BUN: 22 mg/dL (ref 8–27)
CO2: 27 mmol/L (ref 20–29)
Calcium: 10.4 mg/dL — ABNORMAL HIGH (ref 8.7–10.3)
Chloride: 95 mmol/L — ABNORMAL LOW (ref 96–106)
Creatinine, Ser: 1.59 mg/dL — ABNORMAL HIGH (ref 0.57–1.00)
GFR calc Af Amer: 36 mL/min/{1.73_m2} — ABNORMAL LOW (ref >59)
GFR calc non Af Amer: 31 mL/min/{1.73_m2} — ABNORMAL LOW (ref >59)
Glucose: 118 mg/dL — ABNORMAL HIGH (ref 65–99)
Potassium: 3.6 mmol/L (ref 3.5–5.2)
Sodium: 139 mmol/L (ref 134–144)

## 2019-08-12 DIAGNOSIS — N182 Chronic kidney disease, stage 2 (mild): Secondary | ICD-10-CM | POA: Insufficient documentation

## 2019-08-12 NOTE — Progress Notes (Signed)
Cardiology Office Note   Date:  08/13/2019   ID:  [name], DOB 12/31/1945, MRN [id number]  PCP:  Doreene Nest, NP  Cardiologist:   Rollene Rotunda, MD   Chief Complaint  Patient presents with  . Shortness of Breath      History of Present Illness: [name] is a 74 y.o. female who presents for ongoing assessment and management of atrial fibrillation.  At the last visit she had increased dyspnea.  Lexiscan Myoview was negative for ischemia.   Echo demonstrated severe LVH.  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   BNP was only mildly increased.   I increased the Cardizem at the last visit and I increased her diuretic.   I did check her creat and it was elevated at 1.59.    She does feel a little bit better and is a increased dose of meds.  She is lost about 9 pounds.  Total she lost about 30 in the last few months.  She is trying to watch her salt and eating better.  She thinks her breathing is a little bit better and she is not describing any new PND or orthopnea.  MRI is pending for work-up of her severe LVH.   Past Medical History:  Diagnosis Date  . Arthritis   . CAD (coronary artery disease)    a. 07/2008 NSTEMI ->Cath: LM nl, LAD 70p/90p, 80m, LCX nl, RCA nl, EF 60%.  The LAD was stented with a 2.25x72mm Veri-Flex BMS.  b. 8/15 cath patent stent, nonobstructive disease otherwise - unchanged. Elevated troponin felt due to demand ischemia in setting of AF-RVR. c. Elevated trop 06/2014 felt due to demand ischemia.  . Chronic diastolic CHF (congestive heart failure) (HCC)    a. 07/2008 Echo: EF 60-65%, Triv AI/MR, Mild TR;  b. 09/2013 Echo: EF 60-65%, mild conc LVH.  [name] Difficult intubation   . Duodenitis   . GERD (gastroesophageal reflux disease)   . Headache(784.0)   . History of positive PPD   . Hyperlipidemia   . Hypertension   . Lactose intolerance   . Morbid obesity (HCC)   . Osteoporosis   . PAF (paroxysmal atrial fibrillation) (HCC)    a. s/p multiple  cardioversions;  b. 2010: on Tikosyn - dose decreased from to in setting of NSTEMI/QT prolongation. Did not hold NSR. c. admitted 08/2011 with loading at BID with DCCV at that time;  d. Chronic coumadin (CHA2DS2VASc = 5). e. 06/2014: Joice Lofts stopped due to ineffective; started on amiodarone.  . Renal insufficiency    a. Baseline Cr appears 1.1-1.2.  . Rosacea     Past Surgical History:  Procedure Laterality Date  . ABDOMINAL HYSTERECTOMY     partial, bleeding  . CHOLECYSTECTOMY    . CORONARY ANGIOPLASTY WITH STENT PLACEMENT  2010   2.75- x 20-mm VeriFlex DES to the pLAD  . CORONARY STENT PLACEMENT    . ESOPHAGOGASTRODUODENOSCOPY  2003  . KNEE ARTHROSCOPY  2000 & 2001   left and right  . LEFT HEART CATHETERIZATION WITH CORONARY ANGIOGRAM N/A 09/24/2013   Procedure: LEFT HEART CATHETERIZATION WITH CORONARY ANGIOGRAM;  Surgeon: Micheline Chapman, MD;  [facility] [place], LAD stent patent, mLAD 50-60%, CFX [place], RCA 30-40%, EF 70%  . TOTAL KNEE ARTHROPLASTY     bilateral  . TUBAL LIGATION       Current Outpatient Medications  Medication Sig Dispense Refill  . acetaminophen (TYLENOL) 500 MG tablet Take 500-1,000  mg by mouth every 6 (six) hours as needed for headache.     . diltiazem (CARDIZEM CD) 300 MG 24 hr capsule Take 1 capsule (300 mg total) by mouth daily. 90 capsule 3  . furosemide (LASIX) 40 MG tablet Take 1 tablet (40 mg total) by mouth 2 (two) times daily. 180 tablet 3  . loratadine (CLARITIN) 10 MG tablet Take 10 mg by mouth daily as needed for allergies or rhinitis.    . magnesium oxide (MAG-OX) 400 MG tablet Take 1 tablet (400 mg total) by mouth daily. (Patient taking differently: Take 400 mg by mouth at bedtime. ) 90 tablet 2  . warfarin (COUMADIN) 2.5 MG tablet TAKE 1/2 TO 1 TABLET DAILY AS DIRECTED BY COUMADIN CLINIC 75 tablet 1   No current facility-administered medications for this visit.    Allergies:   Other and Statins    ROS:  Please see the history of  present illness.   Otherwise, review of systems are positive for none.   All other systems are reviewed and negative.    PHYSICAL EXAM: VS:  BP (!) 142/76   Pulse 77   Ht 5\' 5"  (1.651 m)   Wt 256 lb (116.1 kg)   SpO2 95%   BMI 42.60 kg/m  , BMI Body mass index is 42.6 kg/m. GENERAL:  Well appearing NECK:  No jugular venous distention, waveform within normal limits, carotid upstroke brisk and symmetric, no bruits, no thyromegaly LUNGS:  Clear to auscultation bilaterally CHEST:  Unremarkable HEART:  PMI not displaced or sustained,S1 and S2 within normal limits, no S3, no clicks, no rubs, no murmurs ABD:  Flat, positive bowel sounds normal in frequency in pitch, no bruits, no rebound, no guarding, no midline pulsatile mass, no hepatomegaly, no splenomegaly EXT:  2 plus pulses throughout, no edema, no cyanosis no clubbing   EKG:  EKG is  ordered today. Atrial fibrillation, rate 77, axis within normal limits, left ventricular hypertrophy per voltage criteria, no acute ST-T wave changes.  Recent Labs: 09/22/2018: ALT 23 05/14/2019: Hemoglobin 14.0; Platelets 241.0; Pro B Natriuretic peptide (BNP) 284.0; TSH 4.82 06/18/2019: BNP 223.0 08/11/2019: BUN 22; Creatinine, Ser 1.59; Potassium 3.6; Sodium 139    Lipid Panel    Component Value Date/Time   CHOL 163 12/19/2018 1052   TRIG 194.0 (H) 12/19/2018 1052   HDL 36.30 (L) 12/19/2018 1052   CHOLHDL 4 12/19/2018 1052   VLDL 38.8 12/19/2018 1052   LDLCALC 88 12/19/2018 1052      Wt Readings from Last 3 Encounters:  08/13/19 256 lb (116.1 kg)  07/27/19 265 lb (120.2 kg)  06/18/19 269 lb 12.8 oz (122.4 kg)      Other studies Reviewed: Additional studies/ records that were reviewed today include: Labs Review of the above records demonstrates:  Please see elsewhere in the note.     ASSESSMENT AND PLAN:   Chronic dyspnea on exertion:     Since her last med changes she feels slightly better.  Her creatinine has bumped but I am going  to leave her on the higher dose of diuretic and check her basic metabolic profile again in 1 month.  I think probably improving her breathing more than the med changes is the weight loss and salt restriction.  I encouraged more the same.  I will be checking the echo as above.   Atrial fibrillation:   [name] has a CHA2DS2 - VASc score of 4.  Her rate seems to be well  controlled.  She tolerates anticoagulation.  No change in therapy.   Chronic diastolic CHF:    She is awaiting MRI.     CKD II: I will follow this in 1 month.  OSA: She refuses CPAP.     Morbid obesity:     I applauded her weight loss and encouraged more the same.  Covid education:  She has been vaccinated  Current medicines are reviewed at length with the patient today.  The patient does not have concerns regarding medicines.  The following changes have been made: None   Labs/ tests ordered today include:    Orders Placed This Encounter  Procedures  . Basic metabolic panel  . EKG 12-Lead     Disposition:   FU with me in 3 months. Forest Becker, MD  08/13/2019 12:04 PM    [facility]

## 2019-08-13 ENCOUNTER — Other Ambulatory Visit: Payer: Self-pay

## 2019-08-13 ENCOUNTER — Ambulatory Visit (INDEPENDENT_AMBULATORY_CARE_PROVIDER_SITE_OTHER): Payer: Medicare Other | Admitting: Cardiology

## 2019-08-13 ENCOUNTER — Encounter: Payer: Self-pay | Admitting: Cardiology

## 2019-08-13 VITALS — BP 142/76 | HR 77 | Ht 65.0 in | Wt 256.0 lb

## 2019-08-13 DIAGNOSIS — I5032 Chronic diastolic (congestive) heart failure: Secondary | ICD-10-CM

## 2019-08-13 DIAGNOSIS — I482 Chronic atrial fibrillation, unspecified: Secondary | ICD-10-CM

## 2019-08-13 DIAGNOSIS — G4733 Obstructive sleep apnea (adult) (pediatric): Secondary | ICD-10-CM

## 2019-08-13 DIAGNOSIS — R0600 Dyspnea, unspecified: Secondary | ICD-10-CM | POA: Diagnosis not present

## 2019-08-13 DIAGNOSIS — R0609 Other forms of dyspnea: Secondary | ICD-10-CM

## 2019-08-13 DIAGNOSIS — I2510 Atherosclerotic heart disease of native coronary artery without angina pectoris: Secondary | ICD-10-CM | POA: Diagnosis not present

## 2019-08-13 DIAGNOSIS — N182 Chronic kidney disease, stage 2 (mild): Secondary | ICD-10-CM

## 2019-08-13 NOTE — Patient Instructions (Signed)
Medication Instructions:  Your physician recommends that you continue on your current medications as directed. Please refer to the Current Medication list given to you today.  *If you need a refill on your cardiac medications before your next appointment, please call your pharmacy*  Lab Work: BMET 1 MONTH  If you have labs (blood work) drawn today and your tests are completely normal, you will receive your results only by: [name] MyChart Message (if you have MyChart) OR . A paper copy in the mail If you have any lab test that is abnormal or we need to change your treatment, we will call you to review the results.  Testing/Procedures: NONE  Follow-Up: At [facility], you and your health needs are our priority.  As part of our continuing mission to provide you with exceptional heart care, we have created designated Provider Care Teams.  These Care Teams include your primary Cardiologist (physician) and Advanced Practice Providers (APPs -  Physician Assistants and Nurse Practitioners) who all work together to provide you with the care you need, when you need it.  We recommend signing up for the patient portal called "MyChart".  Sign up information is provided on this After Visit Summary.  MyChart is used to connect with patients for Virtual Visits (Telemedicine).  Patients are able to view lab/test results, encounter notes, upcoming appointments, etc.  Non-urgent messages can be sent to your provider as well.   To learn more about what you can do with MyChart, go to [email or web address].    Your next appointment:   3 month(s)  The format for your next appointment:   In Person  Provider:   You may see Rollene Rotunda, MD or one of the following Advanced Practice Providers on your designated Care Team:    Theodore Demark, PA-C  Joni Reining, DNP, ANP  Cadence Fransico Michael, NP

## 2019-08-18 ENCOUNTER — Ambulatory Visit (INDEPENDENT_AMBULATORY_CARE_PROVIDER_SITE_OTHER): Payer: Medicare Other | Admitting: Family Medicine

## 2019-08-18 ENCOUNTER — Other Ambulatory Visit: Payer: Self-pay | Admitting: Family Medicine

## 2019-08-18 ENCOUNTER — Encounter: Payer: Self-pay | Admitting: Family Medicine

## 2019-08-18 ENCOUNTER — Other Ambulatory Visit: Payer: Self-pay

## 2019-08-18 VITALS — BP 122/60 | HR 95 | Temp 97.0°F | Wt 261.2 lb

## 2019-08-18 DIAGNOSIS — T63421A Toxic effect of venom of ants, accidental (unintentional), initial encounter: Secondary | ICD-10-CM

## 2019-08-18 DIAGNOSIS — L97511 Non-pressure chronic ulcer of other part of right foot limited to breakdown of skin: Secondary | ICD-10-CM | POA: Diagnosis not present

## 2019-08-18 MED ORDER — MUPIROCIN 2 % EX OINT: 1.0000 "application " | TOPICAL_OINTMENT | Freq: Two times a day (BID) | CUTANEOUS | 0 refills | Status: DC

## 2019-08-18 MED ORDER — CLOBETASOL PROPIONATE 0.05 % EX OINT: 1.0000 "application " | TOPICAL_OINTMENT | Freq: Two times a day (BID) | CUTANEOUS | 0 refills | Status: DC

## 2019-08-18 NOTE — Patient Instructions (Signed)
#  Leg rash - Clobetasol ointment twice daily - can do daily allergy medication like Cetirizine   #Toe lesion - mupirocin ointment twice daily  Return if not improving in 2 weeks

## 2019-08-18 NOTE — Progress Notes (Signed)
   Subjective:     [name] is a 73 y.o. female presenting for Rash (right calf x 10 days )     HPI   #Rash - started 10 days ago - thinks she was bit by a fire ant  - was bitten 3 times - itchy - started a pimple - had some fluid and did not pop it - has been using cortisone cream OTC - also has a lesion between the toes which is painful   Review of Systems   Social History   Tobacco Use  Smoking Status Never Smoker  Smokeless Tobacco Never Used        Objective:    BP Readings from Last 3 Encounters:  08/18/19 122/60  08/13/19 (!) 142/76  07/27/19 (!) 158/72   Wt Readings from Last 3 Encounters:  08/18/19 261 lb 4 oz (118.5 kg)  08/13/19 256 lb (116.1 kg)  07/27/19 265 lb (120.2 kg)    BP 122/60   Pulse 95   Temp (!) 97 F (36.1 C) (Temporal)   Wt 261 lb 4 oz (118.5 kg)   SpO2 97%   BMI 43.47 kg/m    Physical Exam Constitutional:      General: She is not in acute distress.    Appearance: She is well-developed. She is obese. She is not diaphoretic.  HENT:     Right Ear: External ear normal.     Left Ear: External ear normal.  Eyes:     Conjunctiva/sclera: Conjunctivae normal.  Cardiovascular:     Rate and Rhythm: Normal rate.  Pulmonary:     Effort: Pulmonary effort is normal.  Musculoskeletal:     Cervical back: Neck supple.     Comments: Right posterior calf with raised erythematous wheels with two areas of central black necrotic scab w/out underlying necrosis or fluctuance. Right foot between the 2nd and 3rd toes with small ulcer w/ skin breakdown without surrounding erythema.   Skin:    General: Skin is warm and dry.     Capillary Refill: Capillary refill takes less than 2 seconds.  Neurological:     Mental Status: She is alert. Mental status is at baseline.  Psychiatric:        Mood and Affect: Mood normal.        Behavior: Behavior normal.           Assessment & Plan:   Problem List Items Addressed This Visit    None     Visit Diagnoses    Fire ant bite, accidental or unintentional, initial encounter    -  Primary   Relevant Medications   clobetasol ointment (TEMOVATE) 0.05 %   Skin ulcer of toe of right foot, limited to breakdown of skin (HCC)       Relevant Medications   mupirocin ointment (BACTROBAN) 2 %     Leg seems consistent with possible fire ant bit - topical corticosteroid with antihistamine recommended.   Toe seems more consistent with ulcer - trial of mupirocin and return if not improving  Return if symptoms worsen or fail to improve.  Lynnda Child, MD  This visit occurred during the SARS-CoV-2 public health emergency.  Safety protocols were in place, including screening questions prior to the visit, additional usage of staff PPE, and extensive cleaning of exam room while observing appropriate contact time as indicated for disinfecting solutions.

## 2019-08-19 NOTE — Telephone Encounter (Signed)
Clobetasol not covered. Pharmacy is requesting alternative.

## 2019-08-25 ENCOUNTER — Telehealth (HOSPITAL_COMMUNITY): Payer: Self-pay | Admitting: *Deleted

## 2019-08-25 NOTE — Telephone Encounter (Signed)
Attempted to call patient regarding upcoming cardiac MRI appointment. Left message on voicemail with name and callback number  [place] RN Navigator Cardiac [facility] [phone] Office [phone] Cell

## 2019-08-26 ENCOUNTER — Ambulatory Visit (HOSPITAL_COMMUNITY)
Admission: RE | Admit: 2019-08-26 | Discharge: 2019-08-26 | Disposition: A | Discharge status: Home / Self Care | Payer: Medicare Other | Source: Ambulatory Visit | Attending: Cardiology | Admitting: Cardiology

## 2019-08-26 ENCOUNTER — Other Ambulatory Visit: Payer: Self-pay

## 2019-08-26 ENCOUNTER — Other Ambulatory Visit: Payer: Self-pay | Admitting: Cardiology

## 2019-08-26 DIAGNOSIS — I421 Obstructive hypertrophic cardiomyopathy: Secondary | ICD-10-CM | POA: Insufficient documentation

## 2019-08-26 MED ORDER — GADOBUTROL 1 MMOL/ML IV SOLN
10.0000 mL | Freq: Once | INTRAVENOUS | Status: AC | PRN
  Administered 2019-08-26: 10 mL via INTRAVENOUS

## 2019-09-09 ENCOUNTER — Ambulatory Visit (INDEPENDENT_AMBULATORY_CARE_PROVIDER_SITE_OTHER): Payer: Medicare Other | Admitting: Pharmacist

## 2019-09-09 ENCOUNTER — Other Ambulatory Visit: Payer: Self-pay

## 2019-09-09 DIAGNOSIS — Z7901 Long term (current) use of anticoagulants: Secondary | ICD-10-CM

## 2019-09-09 DIAGNOSIS — I4819 Other persistent atrial fibrillation: Secondary | ICD-10-CM

## 2019-09-09 DIAGNOSIS — I4891 Unspecified atrial fibrillation: Secondary | ICD-10-CM

## 2019-09-09 DIAGNOSIS — Z5181 Encounter for therapeutic drug level monitoring: Secondary | ICD-10-CM

## 2019-09-09 LAB — POCT INR: INR: 2 (ref 2.0–3.0)

## 2019-09-09 NOTE — Patient Instructions (Signed)
Continue taking 1 tablet daily except for 1/2 tablet on Mondays, Wednesdays and Fridays.  Recheck INR in 6 weeks.  Coumadin clinic [phone].

## 2019-09-15 ENCOUNTER — Telehealth: Payer: Self-pay | Admitting: Cardiology

## 2019-09-15 NOTE — Telephone Encounter (Signed)
    Pt is returning call from [place] to get MRI result

## 2019-09-15 NOTE — Telephone Encounter (Signed)
[name] Poisson, MD  09/04/2019 9:16 AM EDT     Sending results on behalf of Dr. Antoine Poche while away.   MRI suggests hypertrophic cardiomyopathy with wall thickening at the apex of the heart (bottom of the heart). Would recommend follow up with Dr. Antoine Poche to discuss results further.    Patient called w/results. Scheduled for OV with Dr. Kathi Ludwig 09/29/19

## 2019-09-16 DIAGNOSIS — N182 Chronic kidney disease, stage 2 (mild): Secondary | ICD-10-CM | POA: Diagnosis not present

## 2019-09-16 LAB — BASIC METABOLIC PANEL
BUN/Creatinine Ratio: 10 — ABNORMAL LOW (ref 12–28)
BUN: 15 mg/dL (ref 8–27)
CO2: 26 mmol/L (ref 20–29)
Calcium: 10.5 mg/dL — ABNORMAL HIGH (ref 8.7–10.3)
Chloride: 97 mmol/L (ref 96–106)
Creatinine, Ser: 1.5 mg/dL — ABNORMAL HIGH (ref 0.57–1.00)
GFR calc Af Amer: 39 mL/min/{1.73_m2} — ABNORMAL LOW (ref >59)
GFR calc non Af Amer: 34 mL/min/{1.73_m2} — ABNORMAL LOW (ref >59)
Glucose: 130 mg/dL — ABNORMAL HIGH (ref 65–99)
Potassium: 4.1 mmol/L (ref 3.5–5.2)
Sodium: 139 mmol/L (ref 134–144)

## 2019-09-28 NOTE — Progress Notes (Signed)
Cardiology Office Note   Date:  09/29/2019   ID:  [name], [name] Apr 27, 1945, MRN [id number]  PCP:  Doreene Nest, NP  Cardiologist:   Rollene Rotunda, MD   Chief Complaint  Patient presents with  . Shortness of Breath      History of Present Illness: [name] is a 74 y.o. female who presents for ongoing assessment and management of atrial fibrillation.  At the last visit she had increased dyspnea.  Lexiscan Myoview was negative for ischemia.   Echo demonstrated severe LVH.  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   BNP was only mildly increased.   I increased the Cardizem.  She had an MIR and had evidence of an apical hypertrophic cardiomyopathy.  She had an apical aneurysm.  She had mild LGE.  She had 17 mm maximal thickness.  I reviewed the MRI images for this.    She returns and she continues to lose some weight through diet and exercise but she is saying that her breathing is not much improved.  She continues to get dyspneic with mild exercise.  She is not having any new PND or orthopnea.  She has not had any new lower extremity swelling.  She denies any chest pressure, neck or arm discomfort.   Past Medical History:  Diagnosis Date  . Arthritis   . CAD (coronary artery disease)    a. 07/2008 NSTEMI ->Cath: LM nl, LAD 70p/90p, 40m, LCX nl, RCA nl, EF 60%.  The LAD was stented with a 2.25x87mm Veri-Flex BMS.  b. 8/15 cath patent stent, nonobstructive disease otherwise - unchanged. Elevated troponin felt due to demand ischemia in setting of AF-RVR. c. Elevated trop 06/2014 felt due to demand ischemia.  . Chronic diastolic CHF (congestive heart failure) (HCC)    a. 07/2008 Echo: EF 60-65%, Triv AI/MR, Mild TR;  b. 09/2013 Echo: EF 60-65%, mild conc LVH.  [name] Difficult intubation   . Duodenitis   . GERD (gastroesophageal reflux disease)   . Headache(784.0)   . History of positive PPD   . Hyperlipidemia   . Hypertension   . Lactose intolerance   . Morbid obesity (HCC)    . Osteoporosis   . PAF (paroxysmal atrial fibrillation) (HCC)    a. s/p multiple cardioversions;  b. 2010: on Tikosyn - dose decreased from to in setting of NSTEMI/QT prolongation. Did not hold NSR. c. admitted 08/2011 with loading at BID with DCCV at that time;  d. Chronic coumadin (CHA2DS2VASc = 5). e. 06/2014: Joice Lofts stopped due to ineffective; started on amiodarone.  . Renal insufficiency    a. Baseline Cr appears 1.1-1.2.  . Rosacea     Past Surgical History:  Procedure Laterality Date  . ABDOMINAL HYSTERECTOMY     partial, bleeding  . CHOLECYSTECTOMY    . CORONARY ANGIOPLASTY WITH STENT PLACEMENT  2010   2.75- x 20-mm VeriFlex DES to the pLAD  . CORONARY STENT PLACEMENT    . ESOPHAGOGASTRODUODENOSCOPY  2003  . KNEE ARTHROSCOPY  2000 & 2001   left and right  . LEFT HEART CATHETERIZATION WITH CORONARY ANGIOGRAM N/A 09/24/2013   Procedure: LEFT HEART CATHETERIZATION WITH CORONARY ANGIOGRAM;  Surgeon: Micheline Chapman, MD;  [facility] [place], LAD stent patent, mLAD 50-60%, CFX [place], RCA 30-40%, EF 70%  . TOTAL KNEE ARTHROPLASTY     bilateral  . TUBAL LIGATION       Current Outpatient Medications  Medication Sig Dispense Refill  .  acetaminophen (TYLENOL) 500 MG tablet Take 500-1,000 mg by mouth every 6 (six) hours as needed for headache.     . furosemide (LASIX) 40 MG tablet Take 1 tablet (40 mg total) by mouth 2 (two) times daily. 180 tablet 3  . magnesium oxide (MAG-OX) 400 MG tablet Take 1 tablet (400 mg total) by mouth daily. (Patient taking differently: Take 400 mg by mouth at bedtime. ) 90 tablet 2  . warfarin (COUMADIN) 2.5 MG tablet TAKE 1/2 TO 1 TABLET DAILY AS DIRECTED BY COUMADIN CLINIC 75 tablet 1  . diltiazem (CARDIZEM CD) 360 MG 24 hr capsule Take 1 capsule (360 mg total) by mouth daily. 30 capsule 6   No current facility-administered medications for this visit.    Allergies:   Other and Statins    ROS:  Please see the history of present illness.    Otherwise, review of systems are positive for none.   All other systems are reviewed and negative.    PHYSICAL EXAM: VS:  BP (!) 144/66   Pulse 77   Ht 5\' 5"  (1.651 m)   Wt 256 lb 6.4 oz (116.3 kg)   SpO2 97%   BMI 42.67 kg/m  , BMI Body mass index is 42.67 kg/m. GENERAL:  Well appearing NECK:  No jugular venous distention, waveform within normal limits, carotid upstroke brisk and symmetric, no bruits, no thyromegaly LUNGS:  Clear to auscultation bilaterally CHEST:  Unremarkable HEART:  PMI not displaced or sustained,S1 and S2 within normal limits, no S3, no clicks, no rubs, no murmurs, irregular ABD:  Flat, positive bowel sounds normal in frequency in pitch, no bruits, no rebound, no guarding, no midline pulsatile mass, no hepatomegaly, no splenomegaly EXT:  2 plus pulses throughout, no edema, no cyanosis no clubbing   EKG:  EKG is not ordered today.  .  Recent Labs: 05/14/2019: Hemoglobin 14.0; Platelets 241.0; Pro B Natriuretic peptide (BNP) 284.0; TSH 4.82 06/18/2019: BNP 223.0 09/16/2019: BUN 15; Creatinine, Ser 1.50; Potassium 4.1; Sodium 139    Lipid Panel    Component Value Date/Time   CHOL 163 12/19/2018 1052   TRIG 194.0 (H) 12/19/2018 1052   HDL 36.30 (L) 12/19/2018 1052   CHOLHDL 4 12/19/2018 1052   VLDL 38.8 12/19/2018 1052   LDLCALC 88 12/19/2018 1052      Wt Readings from Last 3 Encounters:  09/29/19 256 lb 6.4 oz (116.3 kg)  08/18/19 261 lb 4 oz (118.5 kg)  08/13/19 256 lb (116.1 kg)      Other studies Reviewed: Additional studies/ records that were reviewed today include: MRI Review of the above records demonstrates:  Please see elsewhere in the note.     ASSESSMENT AND PLAN:   Chronic dyspnea on exertion:    Certainly this could be related to her hypertrophic cardiomyopathy.  I am trying to maximize her LV filling time and slow her heart rate.  Today I am going to increase her Cardizem to 360 mg daily.  I do not strongly have a suspicion for  LVOT gradient but could consider a stress echo in the future.  She does not have high risk features for sudden cardiac death other than increased LV thickness.  She wants to minimize therapy.  I do not see an indication for implanted defibrillator at this point.  She does, after much conversation, consent to the meeting with our geneticist.  I also I am going to send her back to see Dr. 10/14/19.  They have previously discussed atrial  fibrillation ablation but at the time 5 years ago was not considered to be a particularly good option.  I do think that loss of atrial kick is certainly contributing to some of her symptoms and would have them revisit this.  Atrial fibrillation:   Ms. SHINITA MAC has a CHA2DS2 - VASc score of 4.  She tolerates anticoagulation.  I am controlling rate as above.  Apical hypertrophic cardiomyopathy:     We did discuss at length the genetics and implications for her family.  CKD II:   Creatinine has been mildly elevated but stable at 1.5 earlier this month.   OSA:   She has refused CPAP.  Morbid obesity:    I continue to applaud her weight loss.  Covid education:  She has been vaccinated  Current medicines are reviewed at length with the patient today.  The patient does not have concerns regarding medicines.  The following changes have been made: As above  Labs/ tests ordered today include:    Orders Placed This Encounter  Procedures  . Ambulatory referral to Cardiac Electrophysiology  . Ambulatory referral to Genetics     Disposition:   FU with me after the EP evaluation.    Signed, Rollene Rotunda, MD  09/29/2019 12:35 PM    [facility]

## 2019-09-29 ENCOUNTER — Ambulatory Visit (INDEPENDENT_AMBULATORY_CARE_PROVIDER_SITE_OTHER): Payer: Medicare Other | Admitting: Cardiology

## 2019-09-29 ENCOUNTER — Encounter: Payer: Self-pay | Admitting: Cardiology

## 2019-09-29 ENCOUNTER — Other Ambulatory Visit: Payer: Self-pay

## 2019-09-29 VITALS — BP 144/66 | HR 77 | Ht 65.0 in | Wt 256.4 lb

## 2019-09-29 DIAGNOSIS — I4819 Other persistent atrial fibrillation: Secondary | ICD-10-CM | POA: Diagnosis not present

## 2019-09-29 DIAGNOSIS — I5032 Chronic diastolic (congestive) heart failure: Secondary | ICD-10-CM

## 2019-09-29 DIAGNOSIS — I422 Other hypertrophic cardiomyopathy: Secondary | ICD-10-CM | POA: Diagnosis not present

## 2019-09-29 DIAGNOSIS — I2510 Atherosclerotic heart disease of native coronary artery without angina pectoris: Secondary | ICD-10-CM | POA: Diagnosis not present

## 2019-09-29 MED ORDER — DILTIAZEM HCL ER COATED BEADS 360 MG PO CP24
360.0000 mg | ORAL_CAPSULE | Freq: Every day | ORAL | 6 refills | Status: DC
Start: 2019-09-29 — End: 2019-11-30

## 2019-09-29 NOTE — Patient Instructions (Signed)
Medication Instructions:   stop Diltiazem 300 mg  Start taking Diltiazem 360 mg one capsules daily    *If you need a refill on your cardiac medications before your next appointment, please call your pharmacy*   Lab Work: Not needed If you have labs (blood work) drawn today and your tests are completely normal, you will receive your results only by: [name] MyChart Message (if you have MyChart) OR . A paper copy in the mail If you have any lab test that is abnormal or we need to change your treatment, we will call you to review the results.   Testing/Procedures: Not needed  Follow-Up: At [facility], you and your health needs are our priority.  As part of our continuing mission to provide you with exceptional heart care, we have created designated Provider Care Teams.  These Care Teams include your primary Cardiologist (physician) and Advanced Practice Providers (APPs -  Physician Assistants and Nurse Practitioners) who all work together to provide you with the care you need, when you need it.   Your next appointment:   1 month(s) after seeing Dr Johney Frame  The format for your next appointment:   In Person  Provider:   Rollene Rotunda, MD   Other Instructions You have been referred to Dr Johney Frame - for Atrial fibrillation   You have been referred to Dr Gillian Scarce - genetic hypertrophic cardiomyopathy

## 2019-10-05 ENCOUNTER — Other Ambulatory Visit: Payer: Self-pay | Admitting: Cardiology

## 2019-10-05 DIAGNOSIS — I4891 Unspecified atrial fibrillation: Secondary | ICD-10-CM

## 2019-10-05 DIAGNOSIS — Z7901 Long term (current) use of anticoagulants: Secondary | ICD-10-CM

## 2019-10-15 ENCOUNTER — Ambulatory Visit: Payer: Medicare Other | Admitting: Genetic Counselor

## 2019-10-15 ENCOUNTER — Other Ambulatory Visit: Payer: Self-pay

## 2019-10-21 ENCOUNTER — Other Ambulatory Visit: Payer: Self-pay

## 2019-10-21 ENCOUNTER — Ambulatory Visit (INDEPENDENT_AMBULATORY_CARE_PROVIDER_SITE_OTHER): Payer: Medicare Other | Admitting: *Deleted

## 2019-10-21 DIAGNOSIS — Z7901 Long term (current) use of anticoagulants: Secondary | ICD-10-CM

## 2019-10-21 DIAGNOSIS — I4891 Unspecified atrial fibrillation: Secondary | ICD-10-CM

## 2019-10-21 DIAGNOSIS — I4819 Other persistent atrial fibrillation: Secondary | ICD-10-CM | POA: Diagnosis not present

## 2019-10-21 DIAGNOSIS — Z5181 Encounter for therapeutic drug level monitoring: Secondary | ICD-10-CM | POA: Diagnosis not present

## 2019-10-21 LAB — POCT INR: INR: 1.7 — AB (ref 2.0–3.0)

## 2019-10-21 NOTE — Patient Instructions (Addendum)
Description   Take 1 tablet today, then continue taking 1 tablet daily except for 1/2 tablet on Mondays, Wednesdays and Fridays.  Recheck INR in 2 weeks when you see Dr. Johney Frame. Coumadin clinic [phone].

## 2019-10-23 NOTE — Progress Notes (Signed)
Pre Test GC  Referring Provider: Rollene Rotunda, MD   Referral Reason  Clayborne Dana was referred for genetic consult and testing of hypertrophic cardiomyopathy   Personal Medical Information [name] (III.2 on pedigree) is a 74 year-old Caucasian woman who was diagnosed with apical hypertrophic cardiomyopathy about 12 years ago when she sought cardiac clearance for total knee replacement surgery. She reports being asymptomatic at that time. However, she has been experiencing dyspnea, fatigue and chest heaviness for the last two years that has progressively worsened. She reports being cardioverted several times, but denies dizziness or syncope.  Traditional Risk Factors [name] was diagnosed with hypertension about 8 months ago and states that it is well controlled by medication.   Family history [name] (III.52) has a 44 year-old daughter (IV.2) with mitral valve prolapse and a  62 year-old son (IV.3) who was found to have Afib at 60.  Her kids have not yet undergone screening for HCM. However, her son is seen by Dr. Graciela Husbands at [facility] and has had an echocardiogram which she thinks is normal. Her grandson (V.4) age 7 has a brain tumor and granddaughter (V.5) is currently pregnant with twin girls.  Katelin's father (II.2) died of pulmonary aspiration. He was also diagnosed with brain tumor in his 30s and was in the nursing home for the last few years of his life. There is no history of heart disease amongst her paternal relatives.   [name]'s mother (II.4) died at 96 from hepatitis. She was reported to have stomach ulcer and had GI polyps. There is a strong family history of hemochromatosis amongst her maternal relatives. She reports atleast two maternal uncles with hemochromatosis and death from heart attack in her maternal grandmother (I.4), two paternal uncles (II.5-II.6) and heart disease in two other maternal uncles (II.7-II.8).  Genetics Ladonya was counseled on the genetics of  hypertrophic cardiomyopathy (HCM). I explained to her that this is an autosomal dominant condition and hence her son and daughter have a 50% chance of inheriting HCM. She tells me that as her kids are not interested in pursuing cardiac screening or genetic testing for HCM at this time. Nevertheless, I reminded her that she does have a genetic condition and it is recommended that her first degree-relatives, namely children and siblings seek regular surveillance for HCM.  Clinical screening involves echocardiogram and EKG at regular intervals, frequency is typically determined by age, with those over the age of 35 getting screened every 5 years. She verbalized understanding of this.   I discussed penetrance of HCM being incomplete i.e. not all individuals harboring a HCM mutation will present clinically with HCM, and age-related penetrance where clinical presentation of HCM increases with advanced age. Based on her family history it is not clear if she inherited this condition as there are no first-degree relatives wit HCM or sudden death. It is likely that she is de novo for HCM in her family.  I also reviewed variable expression of HCM and emphasized that this condition can express at any age at level of severity in the family. Hence, it is important for her children to stay vigilant and seek regular surveillance for HCM. She verbalized understanding of this. I informed her that some patients - about 8-10% can have compound and digenic mutations for HCM. Also briefly discussed the inheritance pattern and treatment /management plans for the infiltrative cardiomyopathies that present as HCM phenocopies.   We walked through the process of genetic testing. I explained to her that genetic testing is  a probabilistic test dependent upon her age and severity of presentation, presence of risk factors for HCM and importantly family history of HCM or sudden death in first-degree relatives. She verbalized understanding of  this. The yield of genetic testing in her case is very low (approximately 15%) based on her clinical presentation and family pedigree.   The potential outcomes of genetic testing and subsequent management of at-risk family members were discussed so as to manage expectations-  I explained to her that if a mutation is not identified, then all first-degree relatives should regular screening for HCM. I emphasized that even if the genetic test is negative, it does not mean that she does not have HCM. A negative test result can be due to limitations of the genetic test. She verbalized understanding of this.  There is also the likelihood of identifying a "Variant of unknown significance". This result means that the variant has not been detected in a statistically significant number of HCM patients and functional studies have not been performed to verify its pathogenicity. This VUS can be tested in the family to see if it segregates with disease. If a VUS is found, first-degree relatives should undergo regular clinical screening for HCM.  If a pathogenic variant is reported, then her first-degree family members - children and siblings can get tested for this variant. If they test positive, there is a high likelihood that they can develop HCM. In light of variable expression and incomplete penetrance associated with HCM, it is not possible to predict when they will manifest clinically with HCM. It is recommended that family members that test positive for the familial pathogenic variant pursue clinical screening for HCM. Family members that test negative for the familial mutation need not pursue periodic screening for HCM, but seek care if the symptoms develop.   Impression  [name] is symptomatic and diagnosed with HCM at age 41. There is no significant family history of HCM or sudden death amongst her first-degree relatives. Her age of presentation and severity of presentation is indicative of a genetic condition.  Genetic testing for HCM is recommended. This test should include the major genes for HCM as well as the HCM phenocopies of Fabry disease, Danon disease, Wolf-Parkinson White syndrome and FTA. Genetic testing will confirm if she has a sarcomeric gene mutation or a HCM phenocopy.  In addition, we discussed the protections afforded by the Genetic Information Non-Discrimination Act (GINA). I explained to her that GINA protects her from losing her employment or health insurance based on her genotype. However, these protections do not cover life insurance and disability. She verbalized understanding of this.  Please note that the patient has not been counseled in this visit on personal, cultural or ethical issues that she may face due to her heart condition.   Plan After a thorough discussion of the risk and benefits of genetic testing for HCM, Brenna declines genetic testing for HCM.  Sidney Ace, Ph.D, [facility] Clinical Molecular Geneticist

## 2019-11-04 ENCOUNTER — Ambulatory Visit (INDEPENDENT_AMBULATORY_CARE_PROVIDER_SITE_OTHER): Payer: Medicare Other | Admitting: *Deleted

## 2019-11-04 ENCOUNTER — Encounter: Payer: Self-pay | Admitting: Internal Medicine

## 2019-11-04 ENCOUNTER — Ambulatory Visit (INDEPENDENT_AMBULATORY_CARE_PROVIDER_SITE_OTHER): Payer: Medicare Other | Admitting: Internal Medicine

## 2019-11-04 ENCOUNTER — Other Ambulatory Visit: Payer: Self-pay

## 2019-11-04 VITALS — BP 134/68 | HR 85 | Ht 65.0 in | Wt 259.0 lb

## 2019-11-04 DIAGNOSIS — I4891 Unspecified atrial fibrillation: Secondary | ICD-10-CM | POA: Diagnosis not present

## 2019-11-04 DIAGNOSIS — I4819 Other persistent atrial fibrillation: Secondary | ICD-10-CM | POA: Diagnosis not present

## 2019-11-04 DIAGNOSIS — I5032 Chronic diastolic (congestive) heart failure: Secondary | ICD-10-CM

## 2019-11-04 DIAGNOSIS — R0602 Shortness of breath: Secondary | ICD-10-CM

## 2019-11-04 DIAGNOSIS — Z5181 Encounter for therapeutic drug level monitoring: Secondary | ICD-10-CM | POA: Diagnosis not present

## 2019-11-04 DIAGNOSIS — Z7901 Long term (current) use of anticoagulants: Secondary | ICD-10-CM | POA: Diagnosis not present

## 2019-11-04 DIAGNOSIS — D6869 Other thrombophilia: Secondary | ICD-10-CM | POA: Diagnosis not present

## 2019-11-04 DIAGNOSIS — I2510 Atherosclerotic heart disease of native coronary artery without angina pectoris: Secondary | ICD-10-CM

## 2019-11-04 LAB — POCT INR: INR: 1.6 — AB (ref 2.0–3.0)

## 2019-11-04 NOTE — Patient Instructions (Signed)
Description    Take 1 tablet today and 1.5 tablets tomorrow, then start taking  1 tablet daily except for 1/2 a tablet on Wednesday and Fridays. Recheck INR in 2 weeks. Coumadin clinic [phone].

## 2019-11-04 NOTE — Patient Instructions (Addendum)
Medication Instructions:  Your physician recommends that you continue on your current medications as directed. Please refer to the Current Medication list given to you today.  *If you need a refill on your cardiac medications before your next appointment, please call your pharmacy*  Lab Work: None ordered.  If you have labs (blood work) drawn today and your tests are completely normal, you will receive your results only by: . MyChart Message (if you have MyChart) OR . A paper copy in the mail If you have any lab test that is abnormal or we need to change your treatment, we will call you to review the results.  Testing/Procedures: None ordered.  Follow-Up: At [facility], you and your health needs are our priority.  As part of our continuing mission to provide you with exceptional heart care, we have created designated Provider Care Teams.  These Care Teams include your primary Cardiologist (physician) and Advanced Practice Providers (APPs -  Physician Assistants and Nurse Practitioners) who all work together to provide you with the care you need, when you need it.  We recommend signing up for the patient portal called "MyChart".  Sign up information is provided on this After Visit Summary.  MyChart is used to connect with patients for Virtual Visits (Telemedicine).  Patients are able to view lab/test results, encounter notes, upcoming appointments, etc.  Non-urgent messages can be sent to your provider as well.   To learn more about what you can do with MyChart, go to [email or web address].    Your next appointment:   Your physician wants you to follow-up in: As needed.    Other Instructions:  

## 2019-11-04 NOTE — Progress Notes (Signed)
Electrophysiology Office Note   Date:  11/04/2019   ID:  Leanndra, Pember 1945/06/10, MRN [id number]  PCP:  Doreene Nest, NP  Cardiologist:  Dr Antoine Poche Primary Electrophysiologist: Hillis Range, MD    CC: afib   History of Present Illness: [name] is a 74 y.o. female who presents today for electrophysiology evaluation.   She is referred by Dr Antoine Poche for EP consultation regarding afib.  I saw her last in 2016 (my notes reviewed). She has a h/o HTN, CAD, chronic diastolic dysfunction, and obesity.  She also has persistent afib. She failed medical therapy with tikosyn and was placed on amiodarone in 2016.  She did well for several years but was subsequently taken off of amiodarone.  She thinks that she has been persistently in afib for at least a year.  Prior echo demonstrates severe LA with apical aneurysm.  She is felt to have apical HCM and has been evaluated by Dr Jomarie Longs for genetic testing. She also has moderate LA enlargement.  + SOB and fatigue.  She is otherwise unaware of her afib.  She is not very active.  Working on dieting. Today, she denies symptoms of palpitations, chest pain, claudication, dizziness, presyncope, syncope, bleeding, or neurologic sequela.  + stable edema  The patient is tolerating medications without difficulties and is otherwise without complaint today.    Past Medical History:  Diagnosis Date  . Arthritis   . CAD (coronary artery disease)    a. 07/2008 NSTEMI ->Cath: LM nl, LAD 70p/90p, 84m, LCX nl, RCA nl, EF 60%.  The LAD was stented with a 2.25x3mm Veri-Flex BMS.  b. 8/15 cath patent stent, nonobstructive disease otherwise - unchanged. Elevated troponin felt due to demand ischemia in setting of AF-RVR. c. Elevated trop 06/2014 felt due to demand ischemia.  . Chronic diastolic CHF (congestive heart failure) (HCC)    a. 07/2008 Echo: EF 60-65%, Triv AI/MR, Mild TR;  b. 09/2013 Echo: EF 60-65%, mild conc LVH.  [name] Difficult intubation   .  Duodenitis   . GERD (gastroesophageal reflux disease)   . Headache(784.0)   . History of positive PPD   . Hyperlipidemia   . Hypertension   . Lactose intolerance   . Morbid obesity (HCC)   . Osteoporosis   . PAF (paroxysmal atrial fibrillation) (HCC)    a. s/p multiple cardioversions;  b. 2010: on Tikosyn - dose decreased from to in setting of NSTEMI/QT prolongation. Did not hold NSR. c. admitted 08/2011 with loading at BID with DCCV at that time;  d. Chronic coumadin (CHA2DS2VASc = 5). e. 06/2014: Joice Lofts stopped due to ineffective; started on amiodarone.  . Renal insufficiency    a. Baseline Cr appears 1.1-1.2.  . Rosacea    Past Surgical History:  Procedure Laterality Date  . ABDOMINAL HYSTERECTOMY     partial, bleeding  . CHOLECYSTECTOMY    . CORONARY ANGIOPLASTY WITH STENT PLACEMENT  2010   2.75- x 20-mm VeriFlex DES to the pLAD  . CORONARY STENT PLACEMENT    . ESOPHAGOGASTRODUODENOSCOPY  2003  . KNEE ARTHROSCOPY  2000 & 2001   left and right  . LEFT HEART CATHETERIZATION WITH CORONARY ANGIOGRAM N/A 09/24/2013   Procedure: LEFT HEART CATHETERIZATION WITH CORONARY ANGIOGRAM;  Surgeon: Micheline Chapman, MD;  [facility] [place], LAD stent patent, mLAD 50-60%, CFX OK, RCA 30-40%, EF 70%  . TOTAL KNEE ARTHROPLASTY     bilateral  . TUBAL LIGATION  Current Outpatient Medications  Medication Sig Dispense Refill  . acetaminophen (TYLENOL) 500 MG tablet Take 500-1,000 mg by mouth every 6 (six) hours as needed for headache.     . diltiazem (CARDIZEM CD) 360 MG 24 hr capsule Take 1 capsule (360 mg total) by mouth daily. 30 capsule 6  . furosemide (LASIX) 40 MG tablet Take 1 tablet (40 mg total) by mouth 2 (two) times daily. 180 tablet 3  . magnesium oxide (MAG-OX) 400 MG tablet Take 1 tablet (400 mg total) by mouth daily. 90 tablet 2  . warfarin (COUMADIN) 2.5 MG tablet TAKE 1/2 TO 1 TABLET DAILY AS DIRECTED BY COUMADIN CLINIC 75 tablet 1   No current  facility-administered medications for this visit.    Allergies:   Other and Statins   Social History:  The patient  reports that she has never smoked. She has never used smokeless tobacco. She reports that she does not drink alcohol and does not use drugs.   Family History:  The patient's family history includes Breast cancer in her paternal grandmother; Coronary artery disease in her brother; Other in her mother; Pneumonia in her father; Rectal cancer in her paternal grandfather.    ROS:  Please see the history of present illness.   All other systems are personally reviewed and negative.    PHYSICAL EXAM: VS:  BP 134/68   Pulse 85   Ht 5\' 5"  (1.651 m)   Wt 259 lb (117.5 kg)   SpO2 95%   BMI 43.10 kg/m  , BMI Body mass index is 43.1 kg/m. GEN: overweight,  in no acute distress HEENT: normal Neck: no JVD, carotid bruits, or masses Cardiac: iRRR  Respiratory:   normal work of breathing GI: soft  MS: no deformity or atrophy Skin: warm and dry  Neuro:  Strength and sensation are intact Psych: euthymic mood, full affect  EKG:  EKG is ordered today. The ekg ordered today is personally reviewed and shows afib, V rates 80s   Recent Labs: 05/14/2019: Hemoglobin 14.0; Platelets 241.0; Pro B Natriuretic peptide (BNP) 284.0; TSH 4.82 06/18/2019: BNP 223.0 09/16/2019: BUN 15; Creatinine, Ser 1.50; Potassium 4.1; Sodium 139  personally reviewed   Lipid Panel     Component Value Date/Time   CHOL 163 12/19/2018 1052   TRIG 194.0 (H) 12/19/2018 1052   HDL 36.30 (L) 12/19/2018 1052   CHOLHDL 4 12/19/2018 1052   VLDL 38.8 12/19/2018 1052   LDLCALC 88 12/19/2018 1052   personally reviewed   Wt Readings from Last 3 Encounters:  11/04/19 259 lb (117.5 kg)  09/29/19 256 lb 6.4 oz (116.3 kg)  08/18/19 261 lb 4 oz (118.5 kg)      Other studies personally reviewed: Additional studies/ records that were reviewed today include: Dr 08/20/19 notes, my prior notes,  Dr Lindaann Slough notes,  prior echo  Review of the above records today demonstrates: as above   ASSESSMENT AND PLAN:  1.  Longstanding persistent afib The patient has symptomatic, recurrent persistent atrial fibrillation.  She thinks that she has been persistently in afib for at least a year. she has failed medical therapy with tikosyn and amiodarone. Chads2vasc score is 5.  she is anticoagulated with coumadin .  She has moderate biatrial enlargement by MRI (reviewed) Therapeutic strategies for afib including rate control vs ablation were discussed in detail with the patient today. Risk, benefits, and alternatives to EP study and radiofrequency ablation for afib were also discussed in detail today. Her anticipated success with  ablation given longstanding persistent afib is probably 60%.  She is clear that she wishes to avoid ablation at this time. She would like to continue rate control long term.  2. Apical variant HCM I agree with Dr Antoine Poche that ICD is not indicated for her.  3. SOB multifactorial I agree with Dr Antoine Poche that afib may be contributing to her SOB. I also think that inactivity/ deconditioning, obesity, HCM, and advanced age are also factors. We discussed the importance of regular exercise and weight loss today. I have offered ablation as a potential options to improve her SOB.  She is not interested at this time.  Risks, benefits and potential toxicities for medications prescribed and/or refilled reviewed with patient today.    Follow-up:  With Dr Antoine Poche as scheduled I will see as needed  Current medicines are reviewed at length with the patient today.   The patient does not have concerns regarding her medicines.  The following changes were made today:  none  Labs/ tests ordered today include:  No orders of the defined types were placed in this encounter.    Randolm Idol, MD  11/04/2019 9:53 AM     [facility] [address] [place]  [address] [phone] (office) [phone] (fax)

## 2019-11-16 ENCOUNTER — Ambulatory Visit: Payer: Medicare Other | Admitting: Physician Assistant

## 2019-11-18 ENCOUNTER — Ambulatory Visit (INDEPENDENT_AMBULATORY_CARE_PROVIDER_SITE_OTHER): Payer: Medicare Other | Admitting: *Deleted

## 2019-11-18 ENCOUNTER — Other Ambulatory Visit: Payer: Self-pay

## 2019-11-18 DIAGNOSIS — Z7901 Long term (current) use of anticoagulants: Secondary | ICD-10-CM | POA: Diagnosis not present

## 2019-11-18 DIAGNOSIS — Z5181 Encounter for therapeutic drug level monitoring: Secondary | ICD-10-CM | POA: Diagnosis not present

## 2019-11-18 DIAGNOSIS — I4819 Other persistent atrial fibrillation: Secondary | ICD-10-CM

## 2019-11-18 DIAGNOSIS — I4891 Unspecified atrial fibrillation: Secondary | ICD-10-CM | POA: Diagnosis not present

## 2019-11-18 LAB — POCT INR: INR: 2 (ref 2.0–3.0)

## 2019-11-18 NOTE — Patient Instructions (Signed)
Description   Today take 1 tablet then continue taking 1 tablet daily except for 1/2 tablet on Wednesday and Fridays. Recheck INR in 3 weeks. Coumadin clinic [phone].

## 2019-11-29 NOTE — Progress Notes (Signed)
Cardiology Office Note   Date:  11/30/2019   ID:  BATOOL MAJID, DOB 1945-03-31, MRN [id number]  PCP:  Doreene Nest, NP  Cardiologist:   Rollene Rotunda, MD   Chief Complaint  Patient presents with  . Shortness of Breath      History of Present Illness: [name] is a 74 y.o. female who presents for ongoing assessment and management of atrial fibrillation.  At the last visit she had increased dyspnea.  Lexiscan Myoview was negative for ischemia.   Echo demonstrated severe LVH.  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   BNP was only mildly increased.   I increased the Cardizem.  She had an MIR and had evidence of an apical hypertrophic cardiomyopathy.  She had an apical aneurysm.  She had mild LGE.  She had 17 mm maximal thickness.   Since I last saw her she saw our geneticist and although genetic testing was suggested she declined.  She saw Dr. Johney Frame who discussed ablation of atrial fib but the patient declined.    At the last visit I increased her Cardizem.  Unfortunately she thinks that this her to have almost daily headaches.  She also feels lightheaded.  She really does not think her breathing is any better.  She is trying to watch her fluid but probably drinks a little more than I would like.  She says she is pretty good no salt.  She is not having any new PND or orthopnea.  She has chronic dyspnea with mild exertion.   Past Medical History:  Diagnosis Date  . Arthritis   . CAD (coronary artery disease)    a. 07/2008 NSTEMI ->Cath: LM nl, LAD 70p/90p, 59m, LCX nl, RCA nl, EF 60%.  The LAD was stented with a 2.25x73mm Veri-Flex BMS.  b. 8/15 cath patent stent, nonobstructive disease otherwise - unchanged. Elevated troponin felt due to demand ischemia in setting of AF-RVR. c. Elevated trop 06/2014 felt due to demand ischemia.  . Chronic diastolic CHF (congestive heart failure) (HCC)    a. 07/2008 Echo: EF 60-65%, Triv AI/MR, Mild TR;  b. 09/2013 Echo: EF 60-65%, mild conc  LVH.  [name] Difficult intubation   . Duodenitis   . GERD (gastroesophageal reflux disease)   . Headache(784.0)   . History of positive PPD   . Hyperlipidemia   . Hypertension   . Lactose intolerance   . Morbid obesity (HCC)   . Osteoporosis   . PAF (paroxysmal atrial fibrillation) (HCC)    a. s/p multiple cardioversions;  b. 2010: on Tikosyn - dose decreased from to in setting of NSTEMI/QT prolongation. Did not hold NSR. c. admitted 08/2011 with loading at BID with DCCV at that time;  d. Chronic coumadin (CHA2DS2VASc = 5). e. 06/2014: Joice Lofts stopped due to ineffective; started on amiodarone.  . Renal insufficiency    a. Baseline Cr appears 1.1-1.2.  . Rosacea     Past Surgical History:  Procedure Laterality Date  . ABDOMINAL HYSTERECTOMY     partial, bleeding  . CHOLECYSTECTOMY    . CORONARY ANGIOPLASTY WITH STENT PLACEMENT  2010   2.75- x 20-mm VeriFlex DES to the pLAD  . CORONARY STENT PLACEMENT    . ESOPHAGOGASTRODUODENOSCOPY  2003  . KNEE ARTHROSCOPY  2000 & 2001   left and right  . LEFT HEART CATHETERIZATION WITH CORONARY ANGIOGRAM N/A 09/24/2013   Procedure: LEFT HEART CATHETERIZATION WITH CORONARY ANGIOGRAM;  Surgeon: Micheline Chapman, MD;  [facility] [place], LAD stent patent, mLAD 50-60%, CFX OK, RCA 30-40%, EF 70%  . TOTAL KNEE ARTHROPLASTY     bilateral  . TUBAL LIGATION       Current Outpatient Medications  Medication Sig Dispense Refill  . diltiazem (CARDIZEM CD) 180 MG 24 hr capsule Take 1 capsule (180 mg total) by mouth daily. 90 capsule 3  . furosemide (LASIX) 40 MG tablet Take 1 tablet (40 mg total) by mouth 2 (two) times daily. 180 tablet 3  . magnesium oxide (MAG-OX) 400 MG tablet Take 1 tablet (400 mg total) by mouth daily. 90 tablet 2  . warfarin (COUMADIN) 2.5 MG tablet TAKE 1/2 TO 1 TABLET DAILY AS DIRECTED BY COUMADIN CLINIC (Patient taking differently: 2.5 mg. Pt takes 1 tablet daily except Wednesday and Friday. Pt takes 1/2 tablet on Wednesdays  and Fridays.) 75 tablet 1  . acetaminophen (TYLENOL) 500 MG tablet Take 500-1,000 mg by mouth every 6 (six) hours as needed for headache.      No current facility-administered medications for this visit.    Allergies:   Other and Statins    ROS:  Please see the history of present illness.   Otherwise, review of systems are positive for none.   All other systems are reviewed and negative.    PHYSICAL EXAM: VS:  BP (!) 150/66   Pulse 76   Ht 5\' 5"  (1.651 m)   Wt 258 lb 12.8 oz (117.4 kg)   SpO2 97%   BMI 43.07 kg/m  , BMI Body mass index is 43.07 kg/m. GENERAL:  Well appearing NECK:  No jugular venous distention, waveform within normal limits, carotid upstroke brisk and symmetric, no bruits, no thyromegaly LUNGS:  Clear to auscultation bilaterally CHEST:  Unremarkable HEART:  PMI not displaced or sustained,S1 and S2 within normal limits, no S3, no S4, no clicks, no rubs, no murmurs ABD:  Flat, positive bowel sounds normal in frequency in pitch, no bruits, no rebound, no guarding, no midline pulsatile mass, no hepatomegaly, no splenomegaly EXT:  2 plus pulses throughout, no edema, no cyanosis no clubbing   EKG:  EKG is not ordered today. .  Recent Labs: 05/14/2019: Hemoglobin 14.0; Platelets 241.0; Pro B Natriuretic peptide (BNP) 284.0; TSH 4.82 06/18/2019: BNP 223.0 09/16/2019: BUN 15; Creatinine, Ser 1.50; Potassium 4.1; Sodium 139    Lipid Panel    Component Value Date/Time   CHOL 163 12/19/2018 1052   TRIG 194.0 (H) 12/19/2018 1052   HDL 36.30 (L) 12/19/2018 1052   CHOLHDL 4 12/19/2018 1052   VLDL 38.8 12/19/2018 1052   LDLCALC 88 12/19/2018 1052      Wt Readings from Last 3 Encounters:  11/30/19 258 lb 12.8 oz (117.4 kg)  11/04/19 259 lb (117.5 kg)  09/29/19 256 lb 6.4 oz (116.3 kg)      Other studies Reviewed: Additional studies/ records that were reviewed today include: EP records, Genetics note Review of the above records demonstrates:  Please see  elsewhere in the note.     ASSESSMENT AND PLAN:   Chronic dyspnea on exertion:  Unfortunately she is not had any improvement in her dyspnea but she seems to have increased symptoms with the higher dose of Cardizem.  I will reduce that back down to 180 mg.  She is going to work more diligently on volume management and continue aggressive salt restriction.  If she still has symptoms going forward I might try carvedilol.  She been on Toprol in the past.  As  above she does not want to attempts at ablation.  Atrial fibrillation:   [name] has a CHA2DS2 - VASc score of 4.  She tolerates anticoagulation.  She declined ablation.    Apical hypertrophic cardiomyopathy:     As above she declined genetic testing.  I suggested that her family have screening echocardiograms.  Her son is already seen by Dr. Graciela Husbands.  The daughter does not want further evaluation.  CKD II:   Creatinine was 1.5.  No change in therapy.   OSA:   She has refused CPAP.  Morbid obesity:    He has lost weight and is maintaining a 10 pound weight loss.  I encouraged more this visit will help with her breathing.  Covid education:  She has been vaccinated  Current medicines are reviewed at length with the patient today.  The patient does not have concerns regarding medicines.  The following changes have been made: As above  Labs/ tests ordered today include: None  No orders of the defined types were placed in this encounter.    Disposition:   FU with me in 3 months  Signed, Rollene Rotunda, MD  11/30/2019 10:56 AM    [facility]

## 2019-11-30 ENCOUNTER — Other Ambulatory Visit: Payer: Self-pay

## 2019-11-30 ENCOUNTER — Ambulatory Visit (INDEPENDENT_AMBULATORY_CARE_PROVIDER_SITE_OTHER): Payer: Medicare Other | Admitting: Cardiology

## 2019-11-30 ENCOUNTER — Encounter: Payer: Self-pay | Admitting: Cardiology

## 2019-11-30 VITALS — BP 150/66 | HR 76 | Ht 65.0 in | Wt 258.8 lb

## 2019-11-30 DIAGNOSIS — I482 Chronic atrial fibrillation, unspecified: Secondary | ICD-10-CM

## 2019-11-30 DIAGNOSIS — I2510 Atherosclerotic heart disease of native coronary artery without angina pectoris: Secondary | ICD-10-CM

## 2019-11-30 DIAGNOSIS — I5032 Chronic diastolic (congestive) heart failure: Secondary | ICD-10-CM | POA: Diagnosis not present

## 2019-11-30 DIAGNOSIS — R0602 Shortness of breath: Secondary | ICD-10-CM

## 2019-11-30 DIAGNOSIS — I422 Other hypertrophic cardiomyopathy: Secondary | ICD-10-CM | POA: Diagnosis not present

## 2019-11-30 MED ORDER — DILTIAZEM HCL ER COATED BEADS 180 MG PO CP24
180.0000 mg | ORAL_CAPSULE | Freq: Every day | ORAL | 3 refills | Status: DC
Start: 2019-11-30 — End: 2020-11-18

## 2019-11-30 NOTE — Patient Instructions (Signed)
Medication Instructions:  DECREASE your cardizem to 180 mg daily *If you need a refill on your cardiac medications before your next appointment, please call your pharmacy*   Lab Work: None ordered If you have labs (blood work) drawn today and your tests are completely normal, you will receive your results only by: [name] MyChart Message (if you have MyChart) OR . A paper copy in the mail If you have any lab test that is abnormal or we need to change your treatment, we will call you to review the results.   Testing/Procedures: None ordered   Follow-Up: At [facility], you and your health needs are our priority.  As part of our continuing mission to provide you with exceptional heart care, we have created designated Provider Care Teams.  These Care Teams include your primary Cardiologist (physician) and Advanced Practice Providers (APPs -  Physician Assistants and Nurse Practitioners) who all work together to provide you with the care you need, when you need it.  We recommend signing up for the patient portal called "MyChart".  Sign up information is provided on this After Visit Summary.  MyChart is used to connect with patients for Virtual Visits (Telemedicine).  Patients are able to view lab/test results, encounter notes, upcoming appointments, etc.  Non-urgent messages can be sent to your provider as well.   To learn more about what you can do with MyChart, go to [email or web address].    Your next appointment:   3 month(s)  The format for your next appointment:   In Person  Provider:   Rollene Rotunda, MD   Other Instructions None

## 2019-12-09 ENCOUNTER — Ambulatory Visit (INDEPENDENT_AMBULATORY_CARE_PROVIDER_SITE_OTHER): Payer: Medicare Other | Admitting: *Deleted

## 2019-12-09 ENCOUNTER — Other Ambulatory Visit: Payer: Self-pay

## 2019-12-09 DIAGNOSIS — I4891 Unspecified atrial fibrillation: Secondary | ICD-10-CM

## 2019-12-09 DIAGNOSIS — Z7901 Long term (current) use of anticoagulants: Secondary | ICD-10-CM

## 2019-12-09 DIAGNOSIS — Z5181 Encounter for therapeutic drug level monitoring: Secondary | ICD-10-CM | POA: Diagnosis not present

## 2019-12-09 DIAGNOSIS — I4819 Other persistent atrial fibrillation: Secondary | ICD-10-CM

## 2019-12-09 LAB — POCT INR: INR: 2.1 (ref 2.0–3.0)

## 2019-12-09 NOTE — Patient Instructions (Signed)
Description   Continue taking 1 tablet daily except for 1/2 tablet on Wednesday and Fridays. Recheck INR in 4 weeks. Coumadin clinic [phone].

## 2019-12-16 DIAGNOSIS — Z23 Encounter for immunization: Secondary | ICD-10-CM | POA: Diagnosis not present

## 2020-01-06 ENCOUNTER — Other Ambulatory Visit: Payer: Self-pay

## 2020-01-06 ENCOUNTER — Ambulatory Visit (INDEPENDENT_AMBULATORY_CARE_PROVIDER_SITE_OTHER): Payer: Medicare Other | Admitting: *Deleted

## 2020-01-06 DIAGNOSIS — Z7901 Long term (current) use of anticoagulants: Secondary | ICD-10-CM

## 2020-01-06 DIAGNOSIS — I4891 Unspecified atrial fibrillation: Secondary | ICD-10-CM

## 2020-01-06 DIAGNOSIS — Z5181 Encounter for therapeutic drug level monitoring: Secondary | ICD-10-CM

## 2020-01-06 DIAGNOSIS — I4819 Other persistent atrial fibrillation: Secondary | ICD-10-CM

## 2020-01-06 LAB — POCT INR: INR: 2.1 (ref 2.0–3.0)

## 2020-01-06 NOTE — Patient Instructions (Signed)
Description   Continue taking 1 tablet daily except for 1/2 tablet on Wednesday and Fridays. Recheck INR in 5 weeks. Coumadin clinic [phone].

## 2020-02-10 ENCOUNTER — Other Ambulatory Visit: Payer: Self-pay

## 2020-02-10 ENCOUNTER — Ambulatory Visit (INDEPENDENT_AMBULATORY_CARE_PROVIDER_SITE_OTHER): Payer: Medicare Other | Admitting: *Deleted

## 2020-02-10 DIAGNOSIS — Z5181 Encounter for therapeutic drug level monitoring: Secondary | ICD-10-CM

## 2020-02-10 DIAGNOSIS — I4819 Other persistent atrial fibrillation: Secondary | ICD-10-CM

## 2020-02-10 DIAGNOSIS — I4891 Unspecified atrial fibrillation: Secondary | ICD-10-CM

## 2020-02-10 DIAGNOSIS — Z7901 Long term (current) use of anticoagulants: Secondary | ICD-10-CM

## 2020-02-10 LAB — POCT INR: INR: 2.1 (ref 2.0–3.0)

## 2020-02-10 NOTE — Patient Instructions (Signed)
Description   Continue taking 1 tablet daily except for 1/2 tablet on Wednesday and Fridays. Recheck INR in 6 weeks. Coumadin clinic [phone].

## 2020-03-01 NOTE — Progress Notes (Unsigned)
Cardiology Office Note   Date:  03/03/2020   ID:  Kaye, Luoma 1945-03-30, MRN [id number]  PCP:  Doreene Nest, NP  Cardiologist:   Rollene Rotunda, MD   Chief Complaint  Patient presents with  . Edema      History of Present Illness: [name] is a 75 y.o. female who presents for ongoing assessment and management of atrial fibrillation.  At the last visit she had increased dyspnea.  Lexiscan Myoview was negative for ischemia.   Echo demonstrated severe LVH.  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   BNP was only mildly increased.   I increased the Cardizem.  She had an MIR and had evidence of an apical hypertrophic cardiomyopathy.  She had an apical aneurysm.  She had mild LGE.  She had 17 mm maximal thickness.   Since I last saw her she has done better.  She thinks her breathing is a little bit better.  She has continued to lose weight.  We reduced her Cardizem at the last visit and she is having less dizziness.  She is not having the headaches she was having.  She is having probably some reduced swelling in that she sees "wrinkles" in her feet.   Past Medical History:  Diagnosis Date  . Arthritis   . CAD (coronary artery disease)    a. 07/2008 NSTEMI ->Cath: LM nl, LAD 70p/90p, 54m, LCX nl, RCA nl, EF 60%.  The LAD was stented with a 2.25x13mm Veri-Flex BMS.  b. 8/15 cath patent stent, nonobstructive disease otherwise - unchanged. Elevated troponin felt due to demand ischemia in setting of AF-RVR. c. Elevated trop 06/2014 felt due to demand ischemia.  . Chronic diastolic CHF (congestive heart failure) (HCC)    a. 07/2008 Echo: EF 60-65%, Triv AI/MR, Mild TR;  b. 09/2013 Echo: EF 60-65%, mild conc LVH.  [name] Difficult intubation   . Duodenitis   . GERD (gastroesophageal reflux disease)   . Headache(784.0)   . History of positive PPD   . Hyperlipidemia   . Hypertension   . Lactose intolerance   . Morbid obesity (HCC)   . Osteoporosis   . PAF (paroxysmal atrial  fibrillation) (HCC)    a. s/p multiple cardioversions;  b. 2010: on Tikosyn - dose decreased from to in setting of NSTEMI/QT prolongation. Did not hold NSR. c. admitted 08/2011 with loading at BID with DCCV at that time;  d. Chronic coumadin (CHA2DS2VASc = 5). e. 06/2014: Joice Lofts stopped due to ineffective; started on amiodarone.  . Renal insufficiency    a. Baseline Cr appears 1.1-1.2.  . Rosacea     Past Surgical History:  Procedure Laterality Date  . ABDOMINAL HYSTERECTOMY     partial, bleeding  . CHOLECYSTECTOMY    . CORONARY ANGIOPLASTY WITH STENT PLACEMENT  2010   2.75- x 20-mm VeriFlex DES to the pLAD  . CORONARY STENT PLACEMENT    . ESOPHAGOGASTRODUODENOSCOPY  2003  . KNEE ARTHROSCOPY  2000 & 2001   left and right  . LEFT HEART CATHETERIZATION WITH CORONARY ANGIOGRAM N/A 09/24/2013   Procedure: LEFT HEART CATHETERIZATION WITH CORONARY ANGIOGRAM;  Surgeon: Micheline Chapman, MD;  [facility] [place], LAD stent patent, mLAD 50-60%, CFX OK, RCA 30-40%, EF 70%  . TOTAL KNEE ARTHROPLASTY     bilateral  . TUBAL LIGATION       Current Outpatient Medications  Medication Sig Dispense Refill  . acetaminophen (TYLENOL) 500 MG tablet Take 500-1,000  mg by mouth every 6 (six) hours as needed for headache.     . diltiazem (CARDIZEM CD) 180 MG 24 hr capsule Take 1 capsule (180 mg total) by mouth daily. 90 capsule 3  . furosemide (LASIX) 40 MG tablet Take 1 tablet (40 mg total) by mouth 2 (two) times daily. 180 tablet 3  . magnesium oxide (MAG-OX) 400 MG tablet Take 1 tablet (400 mg total) by mouth daily. 90 tablet 2  . warfarin (COUMADIN) 2.5 MG tablet TAKE 1/2 TO 1 TABLET DAILY AS DIRECTED BY COUMADIN CLINIC (Patient taking differently: 2.5 mg. Pt takes 1 tablet daily except Wednesday and Friday. Pt takes 1/2 tablet on Wednesdays and Fridays.) 75 tablet 1   No current facility-administered medications for this visit.    Allergies:   Other and Statins    ROS:  Please see the  history of present illness.   Otherwise, review of systems are positive for none.   All other systems are reviewed and negative.    PHYSICAL EXAM: VS:  BP 140/68 (BP Location: Left Arm, Patient Position: Sitting, Cuff Size: Large)   Pulse 81   Ht 5\' 5"  (1.651 m)   Wt 255 lb (115.7 kg)   BMI 42.43 kg/m  , BMI Body mass index is 42.43 kg/m. GENERAL:  Well appearing NECK:  No jugular venous distention, waveform within normal limits, carotid upstroke brisk and symmetric, no bruits, no thyromegaly LUNGS:  Clear to auscultation bilaterally CHEST:  Unremarkable HEART:  PMI not displaced or sustained,S1 and S2 within normal limits, no S3, no clicks, no rubs, 2 out of 6 apical systolic murmur radiating up the aortic outflow tract, no diastolic murmurs, irregular ABD:  Flat, positive bowel sounds normal in frequency in pitch, no bruits, no rebound, no guarding, no midline pulsatile mass, no hepatomegaly, no splenomegaly EXT:  2 plus pulses throughout, no edema, no cyanosis no clubbing  EKG:  EKG is  ordered today. . Atrial fibrillation, rate 81, left ventricular hypertrophy with repolarization changes, poor anterior R wave progression.  Recent Labs: 05/14/2019: Hemoglobin 14.0; Platelets 241.0; Pro B Natriuretic peptide (BNP) 284.0; TSH 4.82 06/18/2019: BNP 223.0 09/16/2019: BUN 15; Creatinine, Ser 1.50; Potassium 4.1; Sodium 139    Lipid Panel    Component Value Date/Time   CHOL 163 12/19/2018 1052   TRIG 194.0 (H) 12/19/2018 1052   HDL 36.30 (L) 12/19/2018 1052   CHOLHDL 4 12/19/2018 1052   VLDL 38.8 12/19/2018 1052   LDLCALC 88 12/19/2018 1052      Wt Readings from Last 3 Encounters:  03/03/20 255 lb (115.7 kg)  11/30/19 258 lb 12.8 oz (117.4 kg)  11/04/19 259 lb (117.5 kg)      Other studies Reviewed: Additional studies/ records that were reviewed today include: None Review of the above records demonstrates:  NA  ASSESSMENT AND PLAN:   Chronic dyspnea on exertion:   The  patient seems to be slightly better.  No change in therapy.  I think weight loss and keeping her volume down is absolutely key and she is doing better with this.   Atrial fibrillation:   Ms. SARAJEAN DESSERT has a CHA2DS2 - VASc score of 4.  She did not want to consider ablation.  She seems to have rate control and she tolerates anticoagulation.   Apical hypertrophic cardiomyopathy:     She has not wanted genetic testing.  Her son is seen by Dr. [name]  The daughter does not want further evaluation.  CKD II:   Creatinine was 1.5 previously stable.  No change in therapy.   OSA:   She has refused CPAP.  Morbid obesity:   I am proud of her weight loss.  Current medicines are reviewed at length with the patient today.  The patient does not have concerns regarding medicines.  The following changes have been made: None  Labs/ tests ordered today include:  None  Orders Placed This Encounter  Procedures  . EKG 12-Lead     Disposition:   FU with me in 6 months  Signed, Rollene Rotunda, MD  03/03/2020 10:41 AM    [facility]

## 2020-03-03 ENCOUNTER — Ambulatory Visit (INDEPENDENT_AMBULATORY_CARE_PROVIDER_SITE_OTHER): Payer: Medicare Other | Admitting: Cardiology

## 2020-03-03 ENCOUNTER — Other Ambulatory Visit: Payer: Self-pay

## 2020-03-03 ENCOUNTER — Encounter: Payer: Self-pay | Admitting: Cardiology

## 2020-03-03 VITALS — BP 140/68 | HR 81 | Ht 65.0 in | Wt 255.0 lb

## 2020-03-03 DIAGNOSIS — I4819 Other persistent atrial fibrillation: Secondary | ICD-10-CM | POA: Diagnosis not present

## 2020-03-03 DIAGNOSIS — R0609 Other forms of dyspnea: Secondary | ICD-10-CM

## 2020-03-03 DIAGNOSIS — R0600 Dyspnea, unspecified: Secondary | ICD-10-CM

## 2020-03-03 NOTE — Patient Instructions (Signed)
Medication Instructions:  Your physician recommends that you continue on your current medications as directed. Please refer to the Current Medication list given to you today.  *If you need a refill on your cardiac medications before your next appointment, please call your pharmacy*  Lab Work: NONE   Testing/Procedures: NONE   Follow-Up: At [facility], you and your health needs are our priority.  As part of our continuing mission to provide you with exceptional heart care, we have created designated Provider Care Teams.  These Care Teams include your primary Cardiologist (physician) and Advanced Practice Providers (APPs -  Physician Assistants and Nurse Practitioners) who all work together to provide you with the care you need, when you need it.  We recommend signing up for the patient portal called "MyChart".  Sign up information is provided on this After Visit Summary.  MyChart is used to connect with patients for Virtual Visits (Telemedicine).  Patients are able to view lab/test results, encounter notes, upcoming appointments, etc.  Non-urgent messages can be sent to your provider as well.   To learn more about what you can do with MyChart, go to [email or web address].    Your next appointment:   6  month(s)  The format for your next appointment:   In Person  Provider:   You may see James Hochrein, MD or one of the following Advanced Practice Providers on your designated Care Team:    Rhonda Barrett, PA-C  Kathryn Lawrence, DNP, ANP      

## 2020-03-22 ENCOUNTER — Other Ambulatory Visit: Payer: Self-pay | Admitting: Cardiology

## 2020-03-22 DIAGNOSIS — I4891 Unspecified atrial fibrillation: Secondary | ICD-10-CM

## 2020-03-22 DIAGNOSIS — Z7901 Long term (current) use of anticoagulants: Secondary | ICD-10-CM

## 2020-03-23 ENCOUNTER — Other Ambulatory Visit: Payer: Self-pay

## 2020-03-23 ENCOUNTER — Ambulatory Visit (INDEPENDENT_AMBULATORY_CARE_PROVIDER_SITE_OTHER): Payer: Medicare Other | Admitting: *Deleted

## 2020-03-23 DIAGNOSIS — I4891 Unspecified atrial fibrillation: Secondary | ICD-10-CM

## 2020-03-23 DIAGNOSIS — Z5181 Encounter for therapeutic drug level monitoring: Secondary | ICD-10-CM

## 2020-03-23 DIAGNOSIS — Z7901 Long term (current) use of anticoagulants: Secondary | ICD-10-CM | POA: Diagnosis not present

## 2020-03-23 DIAGNOSIS — I4819 Other persistent atrial fibrillation: Secondary | ICD-10-CM | POA: Diagnosis not present

## 2020-03-23 LAB — POCT INR: INR: 2.1 (ref 2.0–3.0)

## 2020-03-23 NOTE — Patient Instructions (Signed)
Description   Continue taking Warfarin 1 tablet daily except for 1/2 tablet on Wednesday and Fridays. Recheck INR in 6 weeks. Coumadin clinic [phone].

## 2020-05-04 ENCOUNTER — Ambulatory Visit (INDEPENDENT_AMBULATORY_CARE_PROVIDER_SITE_OTHER): Payer: Medicare Other | Admitting: *Deleted

## 2020-05-04 ENCOUNTER — Other Ambulatory Visit: Payer: Self-pay

## 2020-05-04 DIAGNOSIS — Z5181 Encounter for therapeutic drug level monitoring: Secondary | ICD-10-CM

## 2020-05-04 DIAGNOSIS — Z7901 Long term (current) use of anticoagulants: Secondary | ICD-10-CM

## 2020-05-04 DIAGNOSIS — I4891 Unspecified atrial fibrillation: Secondary | ICD-10-CM

## 2020-05-04 DIAGNOSIS — I4819 Other persistent atrial fibrillation: Secondary | ICD-10-CM

## 2020-05-04 LAB — POCT INR: INR: 2.3 (ref 2.0–3.0)

## 2020-05-04 NOTE — Patient Instructions (Signed)
Description   Continue taking Warfarin 1 tablet daily except for 1/2 tablet on Wednesday and Fridays. Recheck INR in 6 weeks. Coumadin clinic [phone].

## 2020-06-15 ENCOUNTER — Other Ambulatory Visit: Payer: Self-pay

## 2020-06-15 ENCOUNTER — Ambulatory Visit (INDEPENDENT_AMBULATORY_CARE_PROVIDER_SITE_OTHER): Payer: Medicare Other | Admitting: *Deleted

## 2020-06-15 DIAGNOSIS — I4891 Unspecified atrial fibrillation: Secondary | ICD-10-CM | POA: Diagnosis not present

## 2020-06-15 DIAGNOSIS — Z5181 Encounter for therapeutic drug level monitoring: Secondary | ICD-10-CM

## 2020-06-15 DIAGNOSIS — I4819 Other persistent atrial fibrillation: Secondary | ICD-10-CM

## 2020-06-15 DIAGNOSIS — Z7901 Long term (current) use of anticoagulants: Secondary | ICD-10-CM | POA: Diagnosis not present

## 2020-06-15 LAB — POCT INR: INR: 2.5 (ref 2.0–3.0)

## 2020-06-15 NOTE — Patient Instructions (Signed)
Description   Continue taking Warfarin 1 tablet daily except for 1/2 tablet on Wednesday and Fridays. Recheck INR in 6 weeks. Coumadin clinic [phone].

## 2020-07-25 ENCOUNTER — Other Ambulatory Visit: Payer: Self-pay

## 2020-07-25 MED ORDER — FUROSEMIDE 40 MG PO TABS: 40.0000 mg | ORAL_TABLET | Freq: Two times a day (BID) | ORAL | 3 refills | Status: DC

## 2020-08-03 ENCOUNTER — Other Ambulatory Visit: Payer: Self-pay

## 2020-08-03 ENCOUNTER — Ambulatory Visit (INDEPENDENT_AMBULATORY_CARE_PROVIDER_SITE_OTHER): Payer: Medicare Other | Admitting: *Deleted

## 2020-08-03 DIAGNOSIS — Z7901 Long term (current) use of anticoagulants: Secondary | ICD-10-CM | POA: Diagnosis not present

## 2020-08-03 DIAGNOSIS — Z5181 Encounter for therapeutic drug level monitoring: Secondary | ICD-10-CM | POA: Diagnosis not present

## 2020-08-03 DIAGNOSIS — I4819 Other persistent atrial fibrillation: Secondary | ICD-10-CM | POA: Diagnosis not present

## 2020-08-03 DIAGNOSIS — I4891 Unspecified atrial fibrillation: Secondary | ICD-10-CM | POA: Diagnosis not present

## 2020-08-03 LAB — POCT INR: INR: 2.3 (ref 2.0–3.0)

## 2020-08-03 NOTE — Patient Instructions (Signed)
Description   Continue taking Warfarin 1 tablet daily except for 1/2 tablet on Wednesday and Fridays. Recheck INR in 6 weeks. Coumadin clinic [phone].      

## 2020-08-18 ENCOUNTER — Other Ambulatory Visit: Payer: Self-pay | Admitting: Cardiology

## 2020-08-18 DIAGNOSIS — I4891 Unspecified atrial fibrillation: Secondary | ICD-10-CM

## 2020-08-18 DIAGNOSIS — Z7901 Long term (current) use of anticoagulants: Secondary | ICD-10-CM

## 2020-09-14 ENCOUNTER — Ambulatory Visit (INDEPENDENT_AMBULATORY_CARE_PROVIDER_SITE_OTHER): Payer: Medicare Other | Admitting: *Deleted

## 2020-09-14 ENCOUNTER — Other Ambulatory Visit: Payer: Self-pay

## 2020-09-14 DIAGNOSIS — Z7901 Long term (current) use of anticoagulants: Secondary | ICD-10-CM | POA: Diagnosis not present

## 2020-09-14 DIAGNOSIS — I4891 Unspecified atrial fibrillation: Secondary | ICD-10-CM | POA: Diagnosis not present

## 2020-09-14 DIAGNOSIS — I4819 Other persistent atrial fibrillation: Secondary | ICD-10-CM | POA: Diagnosis not present

## 2020-09-14 DIAGNOSIS — Z5181 Encounter for therapeutic drug level monitoring: Secondary | ICD-10-CM

## 2020-09-14 LAB — POCT INR: INR: 2.1 (ref 2.0–3.0)

## 2020-09-14 NOTE — Patient Instructions (Signed)
Description   Continue taking Warfarin 1 tablet daily except for 1/2 tablet on Wednesdays and Fridays. Recheck INR in 6 weeks. Coumadin clinic [phone].      

## 2020-10-06 ENCOUNTER — Ambulatory Visit: Payer: Medicare Other | Admitting: Primary Care

## 2020-10-26 ENCOUNTER — Other Ambulatory Visit: Payer: Self-pay

## 2020-10-26 ENCOUNTER — Ambulatory Visit (INDEPENDENT_AMBULATORY_CARE_PROVIDER_SITE_OTHER): Payer: Medicare Other | Admitting: *Deleted

## 2020-10-26 DIAGNOSIS — I4819 Other persistent atrial fibrillation: Secondary | ICD-10-CM

## 2020-10-26 DIAGNOSIS — Z5181 Encounter for therapeutic drug level monitoring: Secondary | ICD-10-CM | POA: Diagnosis not present

## 2020-10-26 DIAGNOSIS — I4891 Unspecified atrial fibrillation: Secondary | ICD-10-CM

## 2020-10-26 DIAGNOSIS — Z7901 Long term (current) use of anticoagulants: Secondary | ICD-10-CM

## 2020-10-26 LAB — POCT INR: INR: 2.5 (ref 2.0–3.0)

## 2020-10-26 NOTE — Patient Instructions (Signed)
Description   Continue taking Warfarin 1 tablet daily except for 1/2 tablet on Wednesdays and Fridays. Recheck INR in 6 weeks. Coumadin clinic [phone].

## 2020-11-03 ENCOUNTER — Other Ambulatory Visit: Payer: Self-pay | Admitting: Cardiology

## 2020-11-03 DIAGNOSIS — Z7901 Long term (current) use of anticoagulants: Secondary | ICD-10-CM

## 2020-11-03 DIAGNOSIS — I4891 Unspecified atrial fibrillation: Secondary | ICD-10-CM

## 2020-11-03 NOTE — Telephone Encounter (Signed)
Prescription refill request received for warfarin Lov: 03/04/19 (Hochrein)  Next INR check: 11/2 Warfarin tablet strength: 2.5mg   Appropriate dose and refill sent to requested pharmacy.

## 2020-11-09 DIAGNOSIS — Z23 Encounter for immunization: Secondary | ICD-10-CM | POA: Diagnosis not present

## 2020-11-18 ENCOUNTER — Other Ambulatory Visit: Payer: Self-pay | Admitting: Cardiology

## 2020-11-22 ENCOUNTER — Encounter: Payer: Self-pay | Admitting: Primary Care

## 2020-11-22 ENCOUNTER — Other Ambulatory Visit: Payer: Self-pay

## 2020-11-22 ENCOUNTER — Ambulatory Visit (INDEPENDENT_AMBULATORY_CARE_PROVIDER_SITE_OTHER): Payer: Medicare Other | Admitting: Primary Care

## 2020-11-22 VITALS — BP 144/78 | HR 98 | Temp 97.5°F | Ht 65.0 in | Wt 260.0 lb

## 2020-11-22 DIAGNOSIS — R21 Rash and other nonspecific skin eruption: Secondary | ICD-10-CM

## 2020-11-22 DIAGNOSIS — N898 Other specified noninflammatory disorders of vagina: Secondary | ICD-10-CM

## 2020-11-22 MED ORDER — PREDNISONE 20 MG PO TABS: ORAL_TABLET | ORAL | 0 refills | Status: DC

## 2020-11-22 NOTE — Patient Instructions (Signed)
Start prednisone tablets. Take two tablets my mouth once daily for four days, then one tablet once daily for four days.   We will be in touch regarding the vaginal swab results.   It was a pleasure to see you today!

## 2020-11-22 NOTE — Assessment & Plan Note (Signed)
Acute, exam today without obvious cause.  Suspect incontinence to be contributing.   Wet prep completed and pending. She will apply OTC cortisone cream BID for now.   Await results.

## 2020-11-22 NOTE — Assessment & Plan Note (Signed)
Obvious dermatitis, no infection.  Large surface area so will prescribe prednisone pills.   Rx for prednisone 20 mg sent to pharmacy. Instructions provided.   She will update next week.

## 2020-11-22 NOTE — Progress Notes (Signed)
Subjective:    Patient ID: [name], female    DOB: July 18, 1945, 75 y.o.   MRN: [id number]  HPI  [name] is a very pleasant 75 y.o. female with a history of hypertension, CAD with NSTEMI, chronic atrial fibrillation, OSA, estrogen deficiency who presents today for vaginal itching and rash.  1) Vaginal Itching: History of chronic urinary incontinence, is managed on furosemide which was increased to 40 mg daily in February 2022 by cardiology. She wears pads for incontinence and has been doing so since February 2022.  Her itching is located externally that began three weeks ago. She notices burning to the skin after urinating. She's been scratching to relive the itch. She denies vaginal discharge, hematuria, increased frequency, recent antibiotic use.   She's been using Vagisil with temporary improvement.   2) Rash: Located to the left medial upper extremity for which she first noticed 3 weeks ago. Since then she's been scratching as the rash is itchy and has enlarged. She's applied OTC cortisone without improvement.   She has a history of dermatitis to right upper extremity three years ago, took a "steroid pack" with complete resolve.   No new lotions, detergents, soaps or shampoos. No new medicines, vitamins, supplements. No new pets. No recent outdoor exposure or poison ivy exposure. No bonfire or smoke exposure.  No recent motel or hotel stay or new beds.   No fevers/chills, oral lesions, new joint pains, tick bites, abdominal pain, nausea.     Review of Systems  Genitourinary:  Negative for dysuria, frequency, hematuria and vaginal discharge.       Vaginal skin itching  Skin:  Positive for rash.        Past Medical History:  Diagnosis Date   Arthritis    CAD (coronary artery disease)    a. 07/2008 NSTEMI ->Cath: LM nl, LAD 70p/90p, 8m, LCX nl, RCA nl, EF 60%.  The LAD was stented with a 2.25x102mm Veri-Flex BMS.  b. 8/15 cath patent stent, nonobstructive disease  otherwise - unchanged. Elevated troponin felt due to demand ischemia in setting of AF-RVR. c. Elevated trop 06/2014 felt due to demand ischemia.   Chronic diastolic CHF (congestive heart failure) (HCC)    a. 07/2008 Echo: EF 60-65%, Triv AI/MR, Mild TR;  b. 09/2013 Echo: EF 60-65%, mild conc LVH.   Difficult intubation    Duodenitis    GERD (gastroesophageal reflux disease)    Headache(784.0)    History of positive PPD    Hyperlipidemia    Hypertension    Lactose intolerance    Morbid obesity (HCC)    Osteoporosis    PAF (paroxysmal atrial fibrillation) (HCC)    a. s/p multiple cardioversions;  b. 2010: on Tikosyn - dose decreased from to in setting of NSTEMI/QT prolongation. Did not hold NSR. c. admitted 08/2011 with loading at BID with DCCV at that time;  d. Chronic coumadin (CHA2DS2VASc = 5). e. 06/2014: Joice Lofts stopped due to ineffective; started on amiodarone.   Renal insufficiency    a. Baseline Cr appears 1.1-1.2.   Rosacea     Social History   Socioeconomic History   Marital status: Widowed    Spouse name: Not on file   Number of children: 2   Years of education: Not on file   Highest education level: Not on file  Occupational History   Occupation: retired    Associate Professor: RETIRED  Tobacco Use   Smoking status: Never   Smokeless tobacco: Never  Vaping Use   Vaping Use: Never used  Substance and Sexual Activity   Alcohol use: No    Alcohol/week: 0.0 standard drinks   Drug use: No   Sexual activity: Not Currently    Birth control/protection: Post-menopausal  Other Topics Concern   Not on file  Social History Narrative   Lives in [place], [place] w/ husband.   Social Determinants of Health   Financial Resource Strain: Not on file  Food Insecurity: Not on file  Transportation Needs: Not on file  Physical Activity: Not on file  Stress: Not on file  Social Connections: Not on file  Intimate Partner Violence: Not on file    Past Surgical History:   Procedure Laterality Date   ABDOMINAL HYSTERECTOMY     partial, bleeding   CHOLECYSTECTOMY     CORONARY ANGIOPLASTY WITH STENT PLACEMENT  2010   2.75- x 20-mm VeriFlex DES to the pLAD   CORONARY STENT PLACEMENT     ESOPHAGOGASTRODUODENOSCOPY  2003   KNEE ARTHROSCOPY  2000 & 2001   left and right   LEFT HEART CATHETERIZATION WITH CORONARY ANGIOGRAM N/A 09/24/2013   Procedure: LEFT HEART CATHETERIZATION WITH CORONARY ANGIOGRAM;  Surgeon: Micheline Chapman, MD;  [facility] [place], LAD stent patent, mLAD 50-60%, CFX [place], RCA 30-40%, EF 70%   TOTAL KNEE ARTHROPLASTY     bilateral   TUBAL LIGATION      Family History  Problem Relation Age of Onset   Other Mother        GI Bleed   Pneumonia Father    Coronary artery disease Brother    Breast cancer Paternal Grandmother    Rectal cancer Paternal Grandfather     Allergies  Allergen Reactions   Other Other (See Comments)    Most antibiotics cause yeast infections- Keflex IS tolerated   Statins Other (See Comments)    Muscle and joint pain    Current Outpatient Medications on File Prior to Visit  Medication Sig Dispense Refill   acetaminophen (TYLENOL) 500 MG tablet Take 500-1,000 mg by mouth every 6 (six) hours as needed for headache.      diltiazem (CARDIZEM CD) 180 MG 24 hr capsule TAKE 1 CAPSULE BY MOUTH EVERY DAY 90 capsule 0   furosemide (LASIX) 40 MG tablet Take 1 tablet (40 mg total) by mouth 2 (two) times daily. 180 tablet 3   magnesium oxide (MAG-OX) 400 MG tablet Take 1 tablet (400 mg total) by mouth daily. 90 tablet 2   warfarin (COUMADIN) 2.5 MG tablet TAKE 1/2 TO 1 TABLET DAILY AS DIRECTED BY COUMADIN CLINIC 90 tablet 1   No current facility-administered medications on file prior to visit.    BP (!) 144/78   Pulse 98   Temp (!) 97.5 F (36.4 C) (Temporal)   Ht 5\' 5"  (1.651 m)   Wt 260 lb (117.9 kg)   SpO2 98%   BMI 43.27 kg/m  Objective:   Physical Exam Cardiovascular:     Rate and Rhythm: Normal rate and regular  rhythm.  Pulmonary:     Effort: Pulmonary effort is normal.     Breath sounds: Normal breath sounds.  Genitourinary:    Labia:        Right: No rash, tenderness or lesion.        Left: No rash, tenderness or lesion.      Vagina: No vaginal discharge.     Cervix: No discharge.  Musculoskeletal:     Cervical back: Neck supple.  Skin:  General: Skin is warm and dry.     Findings: Rash present.     Comments: Erythematous rash to left upper extremity, proximal and distal to elbow region. Mild abrasions from scratching noted.           Assessment & Plan:      This visit occurred during the SARS-CoV-2 public health emergency.  Safety protocols were in place, including screening questions prior to the visit, additional usage of staff PPE, and extensive cleaning of exam room while observing appropriate contact time as indicated for disinfecting solutions.

## 2020-11-23 ENCOUNTER — Other Ambulatory Visit: Payer: Self-pay | Admitting: Primary Care

## 2020-11-23 DIAGNOSIS — B3731 Acute candidiasis of vulva and vagina: Secondary | ICD-10-CM

## 2020-11-23 LAB — WET PREP BY MOLECULAR PROBE
Candida species: DETECTED — AB
Gardnerella vaginalis: NOT DETECTED
MICRO NUMBER:: 12517552
SPECIMEN QUALITY:: ADEQUATE
Trichomonas vaginosis: NOT DETECTED

## 2020-11-23 MED ORDER — FLUCONAZOLE 150 MG PO TABS: 150.0000 mg | ORAL_TABLET | Freq: Once | ORAL | 0 refills | Status: AC

## 2020-11-25 ENCOUNTER — Ambulatory Visit (INDEPENDENT_AMBULATORY_CARE_PROVIDER_SITE_OTHER): Payer: Medicare Other

## 2020-11-25 ENCOUNTER — Other Ambulatory Visit: Payer: Self-pay

## 2020-11-25 DIAGNOSIS — Z7901 Long term (current) use of anticoagulants: Secondary | ICD-10-CM

## 2020-11-25 DIAGNOSIS — Z5181 Encounter for therapeutic drug level monitoring: Secondary | ICD-10-CM

## 2020-11-25 DIAGNOSIS — I4891 Unspecified atrial fibrillation: Secondary | ICD-10-CM

## 2020-11-25 DIAGNOSIS — I4819 Other persistent atrial fibrillation: Secondary | ICD-10-CM | POA: Diagnosis not present

## 2020-11-25 LAB — POCT INR: INR: 2.8 (ref 2.0–3.0)

## 2020-11-25 NOTE — Patient Instructions (Signed)
Description   Continue taking Warfarin 1 tablet daily except for 1/2 tablet on Wednesdays and Fridays. Recheck INR in 2 weeks. Coumadin clinic [phone].

## 2020-12-07 ENCOUNTER — Ambulatory Visit (INDEPENDENT_AMBULATORY_CARE_PROVIDER_SITE_OTHER): Payer: Medicare Other

## 2020-12-07 ENCOUNTER — Other Ambulatory Visit: Payer: Self-pay

## 2020-12-07 DIAGNOSIS — I4819 Other persistent atrial fibrillation: Secondary | ICD-10-CM

## 2020-12-07 DIAGNOSIS — I4891 Unspecified atrial fibrillation: Secondary | ICD-10-CM | POA: Diagnosis not present

## 2020-12-07 DIAGNOSIS — Z5181 Encounter for therapeutic drug level monitoring: Secondary | ICD-10-CM | POA: Diagnosis not present

## 2020-12-07 DIAGNOSIS — Z7901 Long term (current) use of anticoagulants: Secondary | ICD-10-CM

## 2020-12-07 LAB — POCT INR: INR: 3.4 — AB (ref 2.0–3.0)

## 2020-12-07 NOTE — Patient Instructions (Signed)
Description   Skip today's dosage of Warfarin, then resume same dosage 1 tablet daily except for 1/2 tablet on Wednesdays and Fridays. Recheck INR in 2 weeks. Coumadin clinic [phone].

## 2020-12-21 ENCOUNTER — Other Ambulatory Visit: Payer: Self-pay

## 2020-12-21 ENCOUNTER — Ambulatory Visit (INDEPENDENT_AMBULATORY_CARE_PROVIDER_SITE_OTHER): Payer: Medicare Other

## 2020-12-21 DIAGNOSIS — Z5181 Encounter for therapeutic drug level monitoring: Secondary | ICD-10-CM | POA: Diagnosis not present

## 2020-12-21 DIAGNOSIS — Z7901 Long term (current) use of anticoagulants: Secondary | ICD-10-CM

## 2020-12-21 DIAGNOSIS — I4819 Other persistent atrial fibrillation: Secondary | ICD-10-CM

## 2020-12-21 DIAGNOSIS — I4891 Unspecified atrial fibrillation: Secondary | ICD-10-CM

## 2020-12-21 LAB — POCT INR: INR: 2.9 (ref 2.0–3.0)

## 2020-12-21 NOTE — Patient Instructions (Signed)
Description   Continue same dosage 1 tablet daily except for 1/2 tablet on Wednesdays and Fridays. Recheck INR in 4 weeks. Coumadin clinic [phone].

## 2021-01-18 ENCOUNTER — Ambulatory Visit (INDEPENDENT_AMBULATORY_CARE_PROVIDER_SITE_OTHER): Payer: Medicare Other | Admitting: Pharmacist

## 2021-01-18 ENCOUNTER — Other Ambulatory Visit: Payer: Self-pay

## 2021-01-18 DIAGNOSIS — Z5181 Encounter for therapeutic drug level monitoring: Secondary | ICD-10-CM | POA: Diagnosis not present

## 2021-01-18 DIAGNOSIS — I4891 Unspecified atrial fibrillation: Secondary | ICD-10-CM | POA: Diagnosis not present

## 2021-01-18 DIAGNOSIS — I4819 Other persistent atrial fibrillation: Secondary | ICD-10-CM

## 2021-01-18 DIAGNOSIS — Z7901 Long term (current) use of anticoagulants: Secondary | ICD-10-CM

## 2021-01-18 LAB — POCT INR: INR: 2.2 (ref 2.0–3.0)

## 2021-01-18 NOTE — Patient Instructions (Signed)
Description   Continue same dosage 1 tablet daily except for 1/2 tablet on Wednesdays and Fridays. Recheck INR in 6 weeks. Coumadin clinic [phone].

## 2021-02-17 ENCOUNTER — Other Ambulatory Visit: Payer: Self-pay | Admitting: Cardiology

## 2021-03-07 ENCOUNTER — Ambulatory Visit (INDEPENDENT_AMBULATORY_CARE_PROVIDER_SITE_OTHER): Payer: Medicare Other

## 2021-03-07 ENCOUNTER — Other Ambulatory Visit: Payer: Self-pay

## 2021-03-07 DIAGNOSIS — I4819 Other persistent atrial fibrillation: Secondary | ICD-10-CM

## 2021-03-07 DIAGNOSIS — Z7901 Long term (current) use of anticoagulants: Secondary | ICD-10-CM

## 2021-03-07 DIAGNOSIS — Z5181 Encounter for therapeutic drug level monitoring: Secondary | ICD-10-CM

## 2021-03-07 DIAGNOSIS — I4891 Unspecified atrial fibrillation: Secondary | ICD-10-CM

## 2021-03-07 LAB — POCT INR: INR: 2 (ref 2.0–3.0)

## 2021-03-07 NOTE — Patient Instructions (Signed)
Description   °Continue same dosage 1 tablet daily except for 1/2 tablet on Wednesdays and Fridays. Recheck INR in 6 weeks. Coumadin clinic [phone].  °  ° ° °

## 2021-03-08 DIAGNOSIS — H2522 Age-related cataract, morgagnian type, left eye: Secondary | ICD-10-CM | POA: Diagnosis not present

## 2021-03-08 DIAGNOSIS — H40011 Open angle with borderline findings, low risk, right eye: Secondary | ICD-10-CM | POA: Diagnosis not present

## 2021-03-08 DIAGNOSIS — H35031 Hypertensive retinopathy, right eye: Secondary | ICD-10-CM | POA: Diagnosis not present

## 2021-03-08 DIAGNOSIS — H26491 Other secondary cataract, right eye: Secondary | ICD-10-CM | POA: Diagnosis not present

## 2021-04-06 NOTE — Progress Notes (Signed)
?  ?Cardiology Office Note ? ? ?Date:  04/07/2021  ? ?IDLAQUISHA MASTRANDREA, DOB 05-23-1945, MRN [id number] ? ?PCP:  Pleas Koch, NP  ?Cardiologist:   Minus Breeding, MD ? ? ?Chief Complaint  ?Patient presents with  ? Shortness of Breath  ? ? ?  ?History of Present Illness: ?[name] is a 76 y.o. female who presents for ongoing assessment and management of atrial fibrillation.  She had increased dyspnea.  Lexiscan Myoview was negative for ischemia.   Echo demonstrated severe LVH.  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   BNP was only mildly increased.   I increased the Cardizem.  She had an MIR and had evidence of an apical hypertrophic cardiomyopathy.  She had an apical aneurysm.  She had mild LGE.  She had 17 mm maximal thickness.   She has not wanted genetic testing.  Her son is seen by Dr. Caryl Comes  The daughter does not want further evaluation.  I have seen her as well ? ?She unfortunately still does not feel very well.  She has some pain and numbness into her left arm down into her hand.  She has a left earache.  She is having some back pain.  She is got some chronic dyspnea on exertion and fatigue.  She has been reading a lot about apical hypertrophic cardiomyopathy and is making her nervous.  She is not having any new complaints however.  It does not seem to be any new PND or orthopnea.  She is not feeling palpitations and has not had presyncope or syncope.  She is not having substernal chest pressure. ? ? ?Past Medical History:  ?Diagnosis Date  ? Arthritis   ? CAD (coronary artery disease)   ? a. 07/2008 NSTEMI ->Cath: LM nl, LAD 70p/90p, 51m, LCX nl, RCA nl, EF 60%.  The LAD was stented with a 2.25x39mm Veri-Flex BMS.  b. 8/15 cath patent stent, nonobstructive disease otherwise - unchanged. Elevated troponin felt due to demand ischemia in setting of AF-RVR. c. Elevated trop 06/2014 felt due to demand ischemia.  ? Chronic diastolic CHF (congestive heart failure) ([place])   ? a. 07/2008 Echo: EF 60-65%,  Triv AI/MR, Mild TR;  b. 09/2013 Echo: EF 60-65%, mild conc LVH.  ? Difficult intubation   ? Duodenitis   ? GERD (gastroesophageal reflux disease)   ? Headache(784.0)   ? History of positive PPD   ? Hyperlipidemia   ? Hypertension   ? Lactose intolerance   ? Morbid obesity ([place])   ? Osteoporosis   ? PAF (paroxysmal atrial fibrillation) ([place])   ? a. s/p multiple cardioversions;  b. 2010: on Tikosyn - dose decreased from 587mcg to 245mcg in setting of NSTEMI/QT prolongation. Did not hold NSR. c. admitted 08/2011 with loading at 536mcg BID with DCCV at that time;  d. Chronic coumadin (CHA2DS2VASc = 5). e. 06/2014: Phyllis Ginger stopped due to ineffective; started on amiodarone.  ? Renal insufficiency   ? a. Baseline Cr appears 1.1-1.2.  ? Rosacea   ? ? ?Past Surgical History:  ?Procedure Laterality Date  ? ABDOMINAL HYSTERECTOMY    ? partial, bleeding  ? CHOLECYSTECTOMY    ? CORONARY ANGIOPLASTY WITH STENT PLACEMENT  2010  ? 2.75- x 20-mm VeriFlex DES to the pLAD  ? CORONARY STENT PLACEMENT    ? ESOPHAGOGASTRODUODENOSCOPY  2003  ? KNEE ARTHROSCOPY  2000 & 2001  ? left and right  ? LEFT HEART CATHETERIZATION WITH CORONARY ANGIOGRAM N/A 09/24/2013  ?  Procedure: LEFT HEART CATHETERIZATION WITH CORONARY ANGIOGRAM;  Surgeon: Micheline Chapman, MD;  [facility] [place], LAD stent patent, mLAD 50-60%, CFX [place], RCA 30-40%, EF 70%  ? TOTAL KNEE ARTHROPLASTY    ? bilateral  ? TUBAL LIGATION    ? ? ? ?Current Outpatient Medications  ?Medication Sig Dispense Refill  ? acetaminophen (TYLENOL) 500 MG tablet Take 500-1,000 mg by mouth every 6 (six) hours as needed for headache.     ? diltiazem (CARDIZEM CD) 240 MG 24 hr capsule Take 1 capsule (240 mg total) by mouth daily. 90 capsule 3  ? furosemide (LASIX) 40 MG tablet Take 1 tablet (40 mg total) by mouth 2 (two) times daily. 180 tablet 3  ? warfarin (COUMADIN) 2.5 MG tablet TAKE 1/2 TO 1 TABLET DAILY AS DIRECTED BY COUMADIN CLINIC 90 tablet 1  ? magnesium oxide (MAG-OX) 400 MG tablet Take 1 tablet (400  mg total) by mouth daily. 90 tablet 2  ? predniSONE (DELTASONE) 20 MG tablet Take two tablets my mouth once daily for four days, then one tablet once daily for four days. 12 tablet 0  ? ?No current facility-administered medications for this visit.  ? ? ?Allergies:   Other and Statins  ? ? ?ROS:  Please see the history of present illness.   Otherwise, review of systems are positive for as above.   All other systems are reviewed and negative.  ? ? ?PHYSICAL EXAM: ?VS:  BP (!) 160/84   Pulse 85   Ht 5\' 5"  (1.651 m)   Wt 260 lb 9.6 oz (118.2 kg)   SpO2 98%   BMI 43.37 kg/m?  , BMI Body mass index is 43.37 kg/m?. ?GENERAL:  Well appearing ?NECK:  No jugular venous distention, waveform within normal limits, carotid upstroke brisk and symmetric, no bruits, no thyromegaly ?LUNGS:  Clear to auscultation bilaterally ?CHEST:  Unremarkable ?HEART:  PMI not displaced or sustained,S1 and S2 within normal limits, no S3,  no clicks, no rubs, 2 out of 6 apical systolic murmur radiating slightly at aortic outflow tract, no diastolic murmurs, irregular ?ABD:  Flat, positive bowel sounds normal in frequency in pitch, no bruits, no rebound, no guarding, no midline pulsatile mass, no hepatomegaly, no splenomegaly ?EXT:  2 plus pulses throughout, no edema, no cyanosis no clubbing ? ? ?EKG:  EKG is  ordered today. ?Atrial fibrillation, rate 85, left ventricular hypertrophy with repolarization changes, poor anterior R wave progression. ? ?Recent Labs: ?No results found for requested labs within last 8760 hours.  ? ? ?Lipid Panel ?   ?Component Value Date/Time  ? CHOL 163 12/19/2018 1052  ? TRIG 194.0 (H) 12/19/2018 1052  ? HDL 36.30 (L) 12/19/2018 1052  ? CHOLHDL 4 12/19/2018 1052  ? VLDL 38.8 12/19/2018 1052  ? LDLCALC 88 12/19/2018 1052  ? ?  ? ?Wt Readings from Last 3 Encounters:  ?04/07/21 260 lb 9.6 oz (118.2 kg)  ?11/22/20 260 lb (117.9 kg)  ?03/03/20 255 lb (115.7 kg)  ?  ? ? ?Other studies Reviewed: ?Additional studies/ records  that were reviewed today include: Labs ?Review of the above records demonstrates: See elsewhere ? ?ASSESSMENT AND PLAN: ? ? ?Chronic dyspnea on exertion:   She seems to be euvolemic.  We have done a work-up and there is no clear etiology other than weight and deconditioning.  No change in therapy.  ? ?Atrial fibrillation:   Ms. ELLERY BRIXEY has a CHA2DS2 - VASc score of 4.   She has not  wanted to consider ablation and we talked about this again today.  She has rate control and anticoagulation.  ? ?Apical hypertrophic cardiomyopathy:   She has not wanted genetic testing.  I am going to order a 3-day Zio patch.  She does not otherwise have any high risk features for sudden cardiac death.  ? ?CKD II:   Creatinine was I will order a basic metabolic profile.  ? ?OSA:    She has refused CPAP.   ? ?Morbid obesity: She understands this is a component and we have talked about weight loss. ? ?Hypertension: I am going to increase her Cardizem for management of her hypertension, rate control as well as potential help with any diastolic dysfunction that she has to increase filling time with her apical hypertrophy.  She will go to 240 mg daily.   ? ?Fatigue: I will check a CBC and TSH I think this is probably related to her sleep apnea  ? ?current medicines are reviewed at length with the patient today.  The patient does not have concerns regarding medicines. ? ?The following changes have been made: As above ? ?Labs/ tests ordered today include:    ? ?Orders Placed This Encounter  ?Procedures  ? Basic Metabolic Panel (BMET)  ? CBC with Differential/Platelet  ? TSH  ? LONG TERM MONITOR (3-14 DAYS)  ? EKG 12-Lead  ? ? ? ?Disposition:   FU with me in 6 months ? ?Signed, ?Minus Breeding, MD  ?04/07/2021 10:57 AM    ?[place] ?

## 2021-04-07 ENCOUNTER — Encounter: Payer: Self-pay | Admitting: Cardiology

## 2021-04-07 ENCOUNTER — Other Ambulatory Visit: Payer: Self-pay

## 2021-04-07 ENCOUNTER — Ambulatory Visit (INDEPENDENT_AMBULATORY_CARE_PROVIDER_SITE_OTHER): Payer: Medicare Other

## 2021-04-07 ENCOUNTER — Ambulatory Visit (INDEPENDENT_AMBULATORY_CARE_PROVIDER_SITE_OTHER): Payer: Medicare Other | Admitting: Cardiology

## 2021-04-07 VITALS — BP 160/84 | HR 85 | Ht 65.0 in | Wt 260.6 lb

## 2021-04-07 DIAGNOSIS — I482 Chronic atrial fibrillation, unspecified: Secondary | ICD-10-CM | POA: Diagnosis not present

## 2021-04-07 DIAGNOSIS — I5032 Chronic diastolic (congestive) heart failure: Secondary | ICD-10-CM | POA: Diagnosis not present

## 2021-04-07 DIAGNOSIS — Z79899 Other long term (current) drug therapy: Secondary | ICD-10-CM

## 2021-04-07 DIAGNOSIS — I422 Other hypertrophic cardiomyopathy: Secondary | ICD-10-CM

## 2021-04-07 LAB — BASIC METABOLIC PANEL
BUN/Creatinine Ratio: 10 — ABNORMAL LOW (ref 12–28)
BUN: 14 mg/dL (ref 8–27)
CO2: 26 mmol/L (ref 20–29)
Calcium: 10.5 mg/dL — ABNORMAL HIGH (ref 8.7–10.3)
Chloride: 100 mmol/L (ref 96–106)
Creatinine, Ser: 1.36 mg/dL — ABNORMAL HIGH (ref 0.57–1.00)
Glucose: 90 mg/dL (ref 70–99)
Potassium: 4.3 mmol/L (ref 3.5–5.2)
Sodium: 141 mmol/L (ref 134–144)
eGFR: 40 mL/min/{1.73_m2} — ABNORMAL LOW (ref >59)

## 2021-04-07 LAB — CBC WITH DIFFERENTIAL/PLATELET
Basophils Absolute: 0.1 10*3/uL (ref 0.0–0.2)
Basos: 1 %
EOS (ABSOLUTE): 0.4 10*3/uL (ref 0.0–0.4)
Eos: 5 %
Hematocrit: 43 % (ref 34.0–46.6)
Hemoglobin: 14.5 g/dL (ref 11.1–15.9)
Immature Grans (Abs): 0 10*3/uL (ref 0.0–0.1)
Immature Granulocytes: 0 %
Lymphocytes Absolute: 1.4 10*3/uL (ref 0.7–3.1)
Lymphs: 17 %
MCH: 28 pg (ref 26.6–33.0)
MCHC: 33.7 g/dL (ref 31.5–35.7)
MCV: 83 fL (ref 79–97)
Monocytes Absolute: 0.6 10*3/uL (ref 0.1–0.9)
Monocytes: 7 %
Neutrophils Absolute: 5.9 10*3/uL (ref 1.4–7.0)
Neutrophils: 70 %
Platelets: 274 10*3/uL (ref 150–450)
RBC: 5.18 x10E6/uL (ref 3.77–5.28)
RDW: 12.9 % (ref 11.7–15.4)
WBC: 8.4 10*3/uL (ref 3.4–10.8)

## 2021-04-07 LAB — TSH: TSH: 5.44 u[IU]/mL — ABNORMAL HIGH (ref 0.450–4.500)

## 2021-04-07 MED ORDER — DILTIAZEM HCL ER COATED BEADS 240 MG PO CP24
240.0000 mg | ORAL_CAPSULE | Freq: Every day | ORAL | 3 refills | Status: DC
Start: 2021-04-07 — End: 2022-04-10

## 2021-04-07 MED ORDER — MAGNESIUM OXIDE 400 MG PO TABS: 400.0000 mg | ORAL_TABLET | Freq: Every day | ORAL | 2 refills | Status: DC

## 2021-04-07 NOTE — Patient Instructions (Addendum)
Medication Instructions:  ?Your physician has recommended you make the following change in your medication:  ?INCREASE: Cardizem 240 mg once daily ?*If you need a refill on your cardiac medications before your next appointment, please call your pharmacy* ? ? ?Lab Work: ?Your physician recommends that you return for lab work in:  ?TODAY: CBC, TSH ?If you have labs (blood work) drawn today and your tests are completely normal, you will receive your results only by: ?MyChart Message (if you have MyChart) OR ?A paper copy in the mail ?If you have any lab test that is abnormal or we need to change your treatment, we will call you to review the results. ? ? ?Testing/Procedures: ?ZIO XT- Long Term Monitor Instructions ? ?Your physician has requested you wear a ZIO patch monitor for 14 days.  ?This is a single patch monitor. [employer] supplies one patch monitor per enrollment. Additional ?stickers are not available. Please do not apply patch if you will be having a Nuclear Stress Test,  ?Echocardiogram, Cardiac CT, MRI, or Chest Xray during the period you would be wearing the  ?monitor. The patch cannot be worn during these tests. You cannot remove and re-apply the  ?ZIO XT patch monitor.  ?Your ZIO patch monitor will be mailed 3 day USPS to your address on file. It may take 3-5 days  ?to receive your monitor after you have been enrolled.  ?Once you have received your monitor, please review the enclosed instructions. Your monitor  ?has already been registered assigning a specific monitor serial # to you. ? ?Billing and Patient Assistance Program Information ? ?We have supplied [employer] with any of your insurance information on file for billing purposes. ?[employer] offers a sliding scale Patient Assistance Program for patients that do not have  ?insurance, or whose insurance does not completely cover the cost of the ZIO monitor.  ?You must apply for the Patient Assistance Program to qualify for this discounted rate.  ?To apply,  please call [employer] at [phone], select option 4, select option 2, ask to apply for  ?Patient Assistance Program. Meredeth Ide will ask your household income, and how many people  ?are in your household. They will quote your out-of-pocket cost based on that information.  ?[employer] will also be able to set up a 58-month, interest-free payment plan if needed. ? ?Applying the monitor ?  ?Shave hair from upper left chest.  ?Hold abrader disc by orange tab. Rub abrader in 40 strokes over the upper left chest as  ?indicated in your monitor instructions.  ?Clean area with 4 enclosed alcohol pads. Let dry.  ?Apply patch as indicated in monitor instructions. Patch will be placed under collarbone on left  ?side of chest with arrow pointing upward.  ?Rub patch adhesive wings for 2 minutes. Remove white label marked "1". Remove the white  ?label marked "2". Rub patch adhesive wings for 2 additional minutes.  ?While looking in a mirror, press and release button in center of patch. A small green light will  ?flash 3-4 times. This will be your only indicator that the monitor has been turned on.  ?Do not shower for the first 24 hours. You may shower after the first 24 hours.  ?Press the button if you feel a symptom. You will hear a small click. Record Date, Time and  ?Symptom in the Patient Logbook.  ?When you are ready to remove the patch, follow instructions on the last 2 pages of Patient  ?Logbook. Stick patch monitor onto the last page  of Patient Logbook.  ?Place Patient Logbook in the blue and white box. Use locking tab on box and tape box closed  ?securely. The blue and white box has prepaid postage on it. Please place it in the mailbox as  ?soon as possible. Your physician should have your test results approximately 7 days after the  ?monitor has been mailed back to [facility].  ?Call [facility] at [phone] if you have questions regarding  ?your ZIO XT patch monitor. Call them immediately if you see  an orange light blinking on your  ?monitor.  ?If your monitor falls off in less than 4 days, contact our Monitor department at [phone].  ?If your monitor becomes loose or falls off after 4 days call [employer] at [phone] for  ?suggestions on securing your monitor ? ? ? ?Follow-Up: ?At [facility], you and your health needs are our priority.  As part of our continuing mission to provide you with exceptional heart care, we have created designated Provider Care Teams.  These Care Teams include your primary Cardiologist (physician) and Advanced Practice Providers (APPs -  Physician Assistants and Nurse Practitioners) who all work together to provide you with the care you need, when you need it. ? ?We recommend signing up for the patient portal called "MyChart".  Sign up information is provided on this After Visit Summary.  MyChart is used to connect with patients for Virtual Visits (Telemedicine).  Patients are able to view lab/test results, encounter notes, upcoming appointments, etc.  Non-urgent messages can be sent to your provider as well.   ?To learn more about what you can do with MyChart, go to [email or web address].   ? ?Your next appointment:   ?6 month(s) ? ?The format for your next appointment:   ?In Person ? ?Provider:   ?Rollene Rotunda, MD   ? ? ?Other Instructions ?  ?

## 2021-04-07 NOTE — Progress Notes (Unsigned)
Enrolled patient for a 3 day Zio XT monitor to be mailed to patients home  

## 2021-04-11 ENCOUNTER — Telehealth: Payer: Self-pay | Admitting: Cardiology

## 2021-04-11 DIAGNOSIS — I482 Chronic atrial fibrillation, unspecified: Secondary | ICD-10-CM | POA: Diagnosis not present

## 2021-04-11 NOTE — Telephone Encounter (Signed)
Patient was returning call. Please advise ?

## 2021-04-11 NOTE — Telephone Encounter (Signed)
Minus Breeding, MD  ?04/09/2021  8:53 PM EST   ?  ?Please draw T3 and T4.  Labs otherwise OK.  ?  ?Called patient . She is aware that Dr Percival Spanish would like to have further testing. ? Prior to talking to patient ,RN contacted [facility]. The above labs -(T3 -test# [id number] and T4) were added-on to previous  labs from 04/07/21.  Awaiting  for  document to be sent a nd signed for  the labs to be done. ? ? Patient aware  if labs are not able to be process. She will need to go get these done. ?

## 2021-04-13 LAB — T4

## 2021-04-13 LAB — T3

## 2021-04-19 ENCOUNTER — Ambulatory Visit (INDEPENDENT_AMBULATORY_CARE_PROVIDER_SITE_OTHER): Payer: Medicare Other | Admitting: *Deleted

## 2021-04-19 ENCOUNTER — Other Ambulatory Visit: Payer: Self-pay

## 2021-04-19 DIAGNOSIS — Z5181 Encounter for therapeutic drug level monitoring: Secondary | ICD-10-CM

## 2021-04-19 DIAGNOSIS — I4891 Unspecified atrial fibrillation: Secondary | ICD-10-CM

## 2021-04-19 DIAGNOSIS — Z7901 Long term (current) use of anticoagulants: Secondary | ICD-10-CM | POA: Diagnosis not present

## 2021-04-19 DIAGNOSIS — I4819 Other persistent atrial fibrillation: Secondary | ICD-10-CM

## 2021-04-19 LAB — POCT INR: INR: 3.7 — AB (ref 2.0–3.0)

## 2021-04-19 NOTE — Patient Instructions (Signed)
Description   ?Do not take any Warfarin today then continue taking Warfarin 1 tablet daily except for 1/2 tablet on Wednesdays and Fridays. Recheck INR in 4 weeks. Coumadin clinic [phone].  ?  ?  ?

## 2021-04-21 ENCOUNTER — Other Ambulatory Visit: Payer: Self-pay

## 2021-04-21 ENCOUNTER — Encounter: Payer: Self-pay | Admitting: Primary Care

## 2021-04-21 ENCOUNTER — Ambulatory Visit (INDEPENDENT_AMBULATORY_CARE_PROVIDER_SITE_OTHER): Payer: Medicare Other | Admitting: Primary Care

## 2021-04-21 VITALS — BP 154/88 | HR 97 | Temp 98.6°F | Ht 65.0 in | Wt 263.0 lb

## 2021-04-21 DIAGNOSIS — I2510 Atherosclerotic heart disease of native coronary artery without angina pectoris: Secondary | ICD-10-CM | POA: Diagnosis not present

## 2021-04-21 DIAGNOSIS — I10 Essential (primary) hypertension: Secondary | ICD-10-CM | POA: Diagnosis not present

## 2021-04-21 DIAGNOSIS — E210 Primary hyperparathyroidism: Secondary | ICD-10-CM | POA: Insufficient documentation

## 2021-04-21 DIAGNOSIS — R7989 Other specified abnormal findings of blood chemistry: Secondary | ICD-10-CM

## 2021-04-21 DIAGNOSIS — I482 Chronic atrial fibrillation, unspecified: Secondary | ICD-10-CM

## 2021-04-21 DIAGNOSIS — G4733 Obstructive sleep apnea (adult) (pediatric): Secondary | ICD-10-CM | POA: Diagnosis not present

## 2021-04-21 DIAGNOSIS — R739 Hyperglycemia, unspecified: Secondary | ICD-10-CM | POA: Diagnosis not present

## 2021-04-21 DIAGNOSIS — M159 Polyosteoarthritis, unspecified: Secondary | ICD-10-CM

## 2021-04-21 DIAGNOSIS — I422 Other hypertrophic cardiomyopathy: Secondary | ICD-10-CM | POA: Diagnosis not present

## 2021-04-21 DIAGNOSIS — I5032 Chronic diastolic (congestive) heart failure: Secondary | ICD-10-CM

## 2021-04-21 DIAGNOSIS — E7800 Pure hypercholesterolemia, unspecified: Secondary | ICD-10-CM | POA: Diagnosis not present

## 2021-04-21 LAB — T4, FREE: Free T4: 0.99 ng/dL (ref 0.60–1.60)

## 2021-04-21 LAB — LIPID PANEL
Cholesterol: 220 mg/dL — ABNORMAL HIGH (ref 0–200)
HDL: 46.2 mg/dL (ref >39.00)
LDL Cholesterol: 140 mg/dL — ABNORMAL HIGH (ref 0–99)
NonHDL: 173.98
Total CHOL/HDL Ratio: 5
Triglycerides: 169 mg/dL — ABNORMAL HIGH (ref 0.0–149.0)
VLDL: 33.8 mg/dL (ref 0.0–40.0)

## 2021-04-21 LAB — BASIC METABOLIC PANEL
BUN: 20 mg/dL (ref 6–23)
CO2: 32 mEq/L (ref 19–32)
Calcium: 10.5 mg/dL (ref 8.4–10.5)
Chloride: 100 mEq/L (ref 96–112)
Creatinine, Ser: 1.4 mg/dL — ABNORMAL HIGH (ref 0.40–1.20)
GFR: 36.21 mL/min — ABNORMAL LOW (ref >60.00)
Glucose, Bld: 93 mg/dL (ref 70–99)
Potassium: 4.3 mEq/L (ref 3.5–5.1)
Sodium: 139 mEq/L (ref 135–145)

## 2021-04-21 LAB — TSH: TSH: 3.98 u[IU]/mL (ref 0.35–5.50)

## 2021-04-21 LAB — HEMOGLOBIN A1C: Hgb A1c MFr Bld: 5.7 % (ref 4.6–6.5)

## 2021-04-21 LAB — VITAMIN D 25 HYDROXY (VIT D DEFICIENCY, FRACTURES): VITD: 16.5 ng/mL — ABNORMAL LOW (ref 30.00–100.00)

## 2021-04-21 NOTE — Assessment & Plan Note (Signed)
Following with cardiology, office notes from March 2023 reviewed. ? ?Continue warfarin 2.5 mg at alternating doses. ?Following with Coumadin clinic. ? ?Continue diltiazem 240 mg daily.  ?

## 2021-04-21 NOTE — Progress Notes (Signed)
? ?Subjective:  ? ? Patient ID: [name], female    DOB: 1945-08-18, 76 y.o.   MRN: [id number] ? ?HPI ? ?[name] is a very pleasant 76 y.o. female with a history of hypertension, persistent atrial fibrillation, CHF, sleep apnea, osteoarthritis, subarachnoid hemorrhage, who presents today for follow-up of chronic conditions. ? ?1) Hypertension/CHF/Atrial Fibrillation/Hyperlipidemia: Currently managed on warfarin 2.5 mg at alternating doses, follows with Coumadin clinic through cardiology.  Also managed on furosemide 40 mg twice daily, diltiazem 240 mg daily, magnesium oxide 400 mg daily ? ?Recently evaluated by cardiology earlier this month for follow up of increased dyspnea.  She has recently undergone Lexiscan Myoview which was negative for ischemia.  Echocardiogram demonstrated severe LVH and also apical aneurysm.  During this visit no further treatment was warranted as her dyspnea on exertion was suspected to be secondary to weight and deconditioning.  She was placed on a 3-day Zio patch monitor, has yet to receive these results. Her Cardizem was increased to 240 mg daily for hypertension management.  ? ?She is checking BP at home which 131/80, 144/76, 142/77, 145/85.  She does have sleep apnea, does not use CPAP machine as she gets up several times during the night to urinate.  ? ?She is not on statin therapy due to intolerance.  ? ?She's never smoked. No prior history of asthma. She is mostly sedentary during the day, sits and watches TV or plays on her phone.  ? ?BP Readings from Last 3 Encounters:  ?04/21/21 (!) 154/88  ?04/07/21 (!) 160/84  ?11/22/20 (!) 144/78  ? ? ? ?2) Elevated TSH: Noted on recent labs per cardiology. No prior history of hypothyroidism, although TSH has fluctuated for years. Recent level of 5.44. One year prior 4.82. She has no family history of thyroid disorder. She has not undergone Free T4.  ? ?She experiences symptoms of joint pain, fatigue, exertional dyspnea.  ? ?3)  Osteoarthritis: Chronic joint pain to lower back, knees, ankles. History of bilateral knee replacements. She struggles with chronic ankle edema which is thought to be secondary to her prior knee surgeries, was determined not to be secondary to her heart.  ? ? ?Review of Systems  ?Constitutional:  Positive for fatigue.  ?Respiratory:  Positive for shortness of breath. Negative for cough.   ?Cardiovascular:  Negative for chest pain.  ?Musculoskeletal:  Positive for arthralgias.  ?Neurological:  Negative for dizziness and headaches.  ? ?   ? ? ?Past Medical History:  ?Diagnosis Date  ? Arthritis   ? CAD (coronary artery disease)   ? a. 07/2008 NSTEMI ->Cath: LM nl, LAD 70p/90p, 43m, LCX nl, RCA nl, EF 60%.  The LAD was stented with a 2.25x33mm Veri-Flex BMS.  b. 8/15 cath patent stent, nonobstructive disease otherwise - unchanged. Elevated troponin felt due to demand ischemia in setting of AF-RVR. c. Elevated trop 06/2014 felt due to demand ischemia.  ? Chronic diastolic CHF (congestive heart failure) (HCC)   ? a. 07/2008 Echo: EF 60-65%, Triv AI/MR, Mild TR;  b. 09/2013 Echo: EF 60-65%, mild conc LVH.  ? Difficult intubation   ? Duodenitis   ? GERD (gastroesophageal reflux disease)   ? Headache(784.0)   ? History of positive PPD   ? Hyperlipidemia   ? Hypertension   ? Lactose intolerance   ? Morbid obesity (HCC)   ? Osteoporosis   ? PAF (paroxysmal atrial fibrillation) (HCC)   ? a. s/p multiple cardioversions;  b. 2010: on Tikosyn - dose  decreased from to in setting of NSTEMI/QT prolongation. Did not hold NSR. c. admitted 08/2011 with loading at BID with DCCV at that time;  d. Chronic coumadin (CHA2DS2VASc = 5). e. 06/2014: Joice Lofts stopped due to ineffective; started on amiodarone.  ? Renal insufficiency   ? a. Baseline Cr appears 1.1-1.2.  ? Rosacea   ? ? ?Social History  ? ?Socioeconomic History  ? Marital status: Widowed  ?  Spouse name: Not on file  ? Number of children: 2  ? Years of education: Not  on file  ? Highest education level: Not on file  ?Occupational History  ? Occupation: retired  ?  Employer: RETIRED  ?Tobacco Use  ? Smoking status: Never  ? Smokeless tobacco: Never  ?Vaping Use  ? Vaping Use: Never used  ?Substance and Sexual Activity  ? Alcohol use: No  ?  Alcohol/week: 0.0 standard drinks  ? Drug use: No  ? Sexual activity: Not Currently  ?  Birth control/protection: Post-menopausal  ?Other Topics Concern  ? Not on file  ?Social History Narrative  ? Lives in [place], [place] w/ husband.  ? ?Social Determinants of Health  ? ?Financial Resource Strain: Not on file  ?Food Insecurity: Not on file  ?Transportation Needs: Not on file  ?Physical Activity: Not on file  ?Stress: Not on file  ?Social Connections: Not on file  ?Intimate Partner Violence: Not on file  ? ? ?Past Surgical History:  ?Procedure Laterality Date  ? ABDOMINAL HYSTERECTOMY    ? partial, bleeding  ? CHOLECYSTECTOMY    ? CORONARY ANGIOPLASTY WITH STENT PLACEMENT  2010  ? 2.75- x 20-mm VeriFlex DES to the pLAD  ? CORONARY STENT PLACEMENT    ? ESOPHAGOGASTRODUODENOSCOPY  2003  ? KNEE ARTHROSCOPY  2000 & 2001  ? left and right  ? LEFT HEART CATHETERIZATION WITH CORONARY ANGIOGRAM N/A 09/24/2013  ? Procedure: LEFT HEART CATHETERIZATION WITH CORONARY ANGIOGRAM;  Surgeon: Micheline Chapman, MD;  [facility] [place], LAD stent patent, mLAD 50-60%, CFX [place], RCA 30-40%, EF 70%  ? TOTAL KNEE ARTHROPLASTY    ? bilateral  ? TUBAL LIGATION    ? ? ?Family History  ?Problem Relation Age of Onset  ? Other Mother   ?     GI Bleed  ? Pneumonia Father   ? Coronary artery disease Brother   ? Breast cancer Paternal Grandmother   ? Rectal cancer Paternal Grandfather   ? ? ?Allergies  ?Allergen Reactions  ? Other Other (See Comments)  ?  Most antibiotics cause yeast infections- Keflex IS tolerated  ? Statins Other (See Comments)  ?  Muscle and joint pain  ? ? ?Current Outpatient Medications on File Prior to Visit  ?Medication Sig Dispense Refill  ? acetaminophen (TYLENOL) 500  MG tablet Take 500-1,000 mg by mouth every 6 (six) hours as needed for headache.     ? diltiazem (CARDIZEM CD) 240 MG 24 hr capsule Take 1 capsule (240 mg total) by mouth daily. 90 capsule 3  ? furosemide (LASIX) 40 MG tablet Take 1 tablet (40 mg total) by mouth 2 (two) times daily. 180 tablet 3  ? magnesium oxide (MAG-OX) 400 MG tablet Take 1 tablet (400 mg total) by mouth daily. 90 tablet 2  ? warfarin (COUMADIN) 2.5 MG tablet TAKE 1/2 TO 1 TABLET DAILY AS DIRECTED BY COUMADIN CLINIC 90 tablet 1  ? ?No current facility-administered medications on file prior to visit.  ? ? ?BP (!) 154/88   Pulse  97   Temp 98.6 ?F (37 ?C) (Oral)   Ht 5\' 5"  (1.651 m)   Wt 263 lb (119.3 kg)   SpO2 96%   BMI 43.77 kg/m?  ?Objective:  ? Physical Exam ?Cardiovascular:  ?   Rate and Rhythm: Normal rate and regular rhythm.  ?   Comments: Moderate bilateral ankle edema.  ?No pitting. ?Pulmonary:  ?   Effort: Pulmonary effort is normal.  ?   Breath sounds: Normal breath sounds.  ?Abdominal:  ?   General: Bowel sounds are normal.  ?   Palpations: Abdomen is soft.  ?   Tenderness: There is no abdominal tenderness.  ?Musculoskeletal:  ?   Cervical back: Neck supple.  ?Skin: ?   General: Skin is warm and dry.  ?Neurological:  ?   Mental Status: She is oriented to person, place, and time.  ?Psychiatric:     ?   Mood and Affect: Mood normal.  ? ? ? ? ? ?   ?Assessment & Plan:  ? ? ? ? ?This visit occurred during the SARS-CoV-2 public health emergency.  Safety protocols were in place, including screening questions prior to the visit, additional usage of staff PPE, and extensive cleaning of exam room while observing appropriate contact time as indicated for disinfecting solutions.  ?

## 2021-04-21 NOTE — Assessment & Plan Note (Signed)
Following with cardiology, office notes from March 2023 reviewed.   

## 2021-04-21 NOTE — Assessment & Plan Note (Signed)
Noted on labs for last several years.  ? ?Repeat TSH pending. ?Add Free T4 and TPO antibodies.  ?

## 2021-04-21 NOTE — Patient Instructions (Signed)
Stop by the lab prior to leaving today. I will notify you of your results once received.   It was a pleasure to see you today!  

## 2021-04-21 NOTE — Assessment & Plan Note (Signed)
Improving. ?Home readings are lower. ? ?Continue diltiazem 240 mg daily. ?

## 2021-04-21 NOTE — Assessment & Plan Note (Signed)
Offered PT for mobility and endurance training, she kindly declines.  ?

## 2021-04-21 NOTE — Assessment & Plan Note (Signed)
Asymptomatic. ? ?Continue with BP control. ? ?Lipids and A1C pending. ?

## 2021-04-21 NOTE — Assessment & Plan Note (Signed)
Not compliant to CPAP machine, discussed poor outcomes associated with noncompliance.  ?

## 2021-04-21 NOTE — Assessment & Plan Note (Signed)
Appears euvolemic today. ? ?Cardiology notes from March 2023 reviewed. ?Continue furosemide 40 mg BID. ? ?

## 2021-04-21 NOTE — Assessment & Plan Note (Signed)
Repeat lipid panel pending. ? ?Statin intolerant.  ?

## 2021-04-21 NOTE — Assessment & Plan Note (Signed)
Slight elevation noted on prior labs. ?Checking vitamin D, PTH, calcium. ?

## 2021-04-22 DIAGNOSIS — I482 Chronic atrial fibrillation, unspecified: Secondary | ICD-10-CM | POA: Diagnosis not present

## 2021-04-25 ENCOUNTER — Telehealth: Payer: Self-pay | Admitting: Primary Care

## 2021-04-25 DIAGNOSIS — E559 Vitamin D deficiency, unspecified: Secondary | ICD-10-CM

## 2021-04-25 DIAGNOSIS — E7800 Pure hypercholesterolemia, unspecified: Secondary | ICD-10-CM

## 2021-04-25 LAB — THYROID PEROXIDASE ANTIBODY: Thyroperoxidase Ab SerPl-aCnc: <1 IU/mL (ref <9)

## 2021-04-25 LAB — PARATHYROID HORMONE, INTACT (NO CA): PTH: 106 pg/mL — ABNORMAL HIGH (ref 16–77)

## 2021-04-25 MED ORDER — PRAVASTATIN SODIUM 40 MG PO TABS: ORAL_TABLET | ORAL | 1 refills | Status: DC

## 2021-04-25 MED ORDER — VITAMIN D (ERGOCALCIFEROL) 1.25 MG (50000 UNIT) PO CAPS: ORAL_CAPSULE | ORAL | 0 refills | Status: DC

## 2021-04-25 NOTE — Telephone Encounter (Signed)
Please notify patient: ? ?I am sending in a prescription for vitamin D 50,000 IU capsules to [facility]. Take 1 capsule by mouth once weekly. ? ?For cholesterol: ? ?Has she tried taking a statin medication three days weekly? This may not bother her joints like it would if she took daily. Would she be willing to try? This is the best option. ?Second option is to try a non statin medication called Zetia. This is taken once daily.  ? ?Let me know what she decides.  ? ?Needs lab appt for 3 months for vitamin D and cholesterol. ?

## 2021-04-25 NOTE — Telephone Encounter (Signed)
See in response to lab message  ?

## 2021-04-25 NOTE — Telephone Encounter (Signed)
Noted, Rx for pravastatin sent to pharmacy  ?

## 2021-04-25 NOTE — Telephone Encounter (Signed)
Pt called stating she couldn't figure out how to respond to [employer]. Pt states that she will do whatever Carlis Abbott needs her to do. Please advise. ?

## 2021-04-25 NOTE — Telephone Encounter (Signed)
Patient will get vit d and take weekly. I have made lab appointment.  ? ?She will try taking the statin three times a week and let us know if she continues to have any symptoms.  ?

## 2021-05-08 ENCOUNTER — Other Ambulatory Visit: Payer: Self-pay | Admitting: Cardiology

## 2021-05-08 DIAGNOSIS — Z7901 Long term (current) use of anticoagulants: Secondary | ICD-10-CM

## 2021-05-08 DIAGNOSIS — I4891 Unspecified atrial fibrillation: Secondary | ICD-10-CM

## 2021-05-17 ENCOUNTER — Ambulatory Visit (INDEPENDENT_AMBULATORY_CARE_PROVIDER_SITE_OTHER): Payer: Medicare Other

## 2021-05-17 DIAGNOSIS — Z5181 Encounter for therapeutic drug level monitoring: Secondary | ICD-10-CM | POA: Diagnosis not present

## 2021-05-17 DIAGNOSIS — I4819 Other persistent atrial fibrillation: Secondary | ICD-10-CM

## 2021-05-17 DIAGNOSIS — I4891 Unspecified atrial fibrillation: Secondary | ICD-10-CM

## 2021-05-17 DIAGNOSIS — Z7901 Long term (current) use of anticoagulants: Secondary | ICD-10-CM

## 2021-05-17 LAB — POCT INR: INR: 3.2 — AB (ref 2.0–3.0)

## 2021-05-17 NOTE — Patient Instructions (Signed)
Description   ?Eat you something green and leafy today.  Start taking Warfarin 1 tablet daily except for 1/2 tablet on Mondays, Wednesdays and Fridays. Recheck INR in 3 weeks. Coumadin clinic [phone].  ?  ?  ?

## 2021-06-11 NOTE — Progress Notes (Signed)
?  ?Cardiology Office Note ? ? ?Date:  06/12/2021  ? ?[name], DOB November 10, 1945, MRN [id number] ? ?PCP:  Doreene Nest, NP  ?Cardiologist:   Rollene Rotunda, MD ? ? ?Chief Complaint  ?Patient presents with  ? Shortness of Breath  ? ? ?  ?History of Present Illness: ?[name] is a 76 y.o. female who presents for ongoing assessment and management of atrial fibrillation.  She had increased dyspnea.  Lexiscan Myoview was negative for ischemia.   Echo demonstrated severe LVH.  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   BNP was only mildly increased.   I increased the Cardizem.  She had an MIR and had evidence of an apical hypertrophic cardiomyopathy.  She had an apical aneurysm.  She had mild LGE.  She had 17 mm maximal thickness.   She has not wanted genetic testing.  Her son is seen by Dr. Graciela Husbands  The daughter does not want further evaluation.  I have seen her as well ? ?Since I last saw her she had a Zio patch and she has had no high risk findings.  I increased her Cardizem.   She is not having any new shortness of breath but has some significant chronic dyspnea on exertion.  She is not describing any new PND or orthopnea.  She has lots of joint and muscle aches.  She thinks some of this is related to the pravastatin that she is taking and she wants to hold this.  She is not having any new PND or orthopnea.  She does have sleep apnea but did not tolerate CPAP in the past.  I did increase the Cardizem.  Systolic blood pressures are still mildly elevated but her diastolics are better.  She seems to be tolerating this dose.  She did not tolerate higher doses in the past. ? ? ?Past Medical History:  ?Diagnosis Date  ? Arthritis   ? CAD (coronary artery disease)   ? a. 07/2008 NSTEMI ->Cath: LM nl, LAD 70p/90p, 68m, LCX nl, RCA nl, EF 60%.  The LAD was stented with a 2.25x76mm Veri-Flex BMS.  b. 8/15 cath patent stent, nonobstructive disease otherwise - unchanged. Elevated troponin felt due to demand ischemia  in setting of AF-RVR. c. Elevated trop 06/2014 felt due to demand ischemia.  ? Chronic diastolic CHF (congestive heart failure) (HCC)   ? a. 07/2008 Echo: EF 60-65%, Triv AI/MR, Mild TR;  b. 09/2013 Echo: EF 60-65%, mild conc LVH.  ? Difficult intubation   ? Duodenitis   ? GERD (gastroesophageal reflux disease)   ? Headache(784.0)   ? History of positive PPD   ? Hyperlipidemia   ? Hypertension   ? Lactose intolerance   ? Morbid obesity (HCC)   ? Osteoporosis   ? PAF (paroxysmal atrial fibrillation) (HCC)   ? a. s/p multiple cardioversions;  b. 2010: on Tikosyn - dose decreased from to in setting of NSTEMI/QT prolongation. Did not hold NSR. c. admitted 08/2011 with loading at BID with DCCV at that time;  d. Chronic coumadin (CHA2DS2VASc = 5). e. 06/2014: Joice Lofts stopped due to ineffective; started on amiodarone.  ? Renal insufficiency   ? a. Baseline Cr appears 1.1-1.2.  ? Rosacea   ? ? ?Past Surgical History:  ?Procedure Laterality Date  ? ABDOMINAL HYSTERECTOMY    ? partial, bleeding  ? CHOLECYSTECTOMY    ? CORONARY ANGIOPLASTY WITH STENT PLACEMENT  2010  ? 2.75- x 20-mm VeriFlex DES  to the pLAD  ? CORONARY STENT PLACEMENT    ? ESOPHAGOGASTRODUODENOSCOPY  2003  ? KNEE ARTHROSCOPY  2000 & 2001  ? left and right  ? LEFT HEART CATHETERIZATION WITH CORONARY ANGIOGRAM N/A 09/24/2013  ? Procedure: LEFT HEART CATHETERIZATION WITH CORONARY ANGIOGRAM;  Surgeon: Micheline Chapman, MD;  [facility] [place], LAD stent patent, mLAD 50-60%, CFX [place], RCA 30-40%, EF 70%  ? TOTAL KNEE ARTHROPLASTY    ? bilateral  ? TUBAL LIGATION    ? ? ? ?Current Outpatient Medications  ?Medication Sig Dispense Refill  ? acetaminophen (TYLENOL) 500 MG tablet Take 500-1,000 mg by mouth every 6 (six) hours as needed for headache.     ? diltiazem (CARDIZEM CD) 240 MG 24 hr capsule Take 1 capsule (240 mg total) by mouth daily. 90 capsule 3  ? furosemide (LASIX) 40 MG tablet Take 1 tablet (40 mg total) by mouth 2 (two) times daily. 180 tablet 3  ?  magnesium oxide (MAG-OX) 400 MG tablet Take 1 tablet (400 mg total) by mouth daily. 90 tablet 2  ? pravastatin (PRAVACHOL) 40 MG tablet Take 1 tablet by mouth three times weekly for cholesterol. 36 tablet 1  ? Vitamin D, Ergocalciferol, (DRISDOL) 1.25 MG (50000 UNIT) CAPS capsule Take 1 capsule by mouth once weekly for 12 weeks. 12 capsule 0  ? warfarin (COUMADIN) 2.5 MG tablet TAKE 1/2 TO 1 TABLET DAILY AS DIRECTED BY COUMADIN CLINIC 90 tablet 1  ? ?No current facility-administered medications for this visit.  ? ? ?Allergies:   Other and Statins  ? ? ?ROS:  Please see the history of present illness.   Otherwise, review of systems are positive for as none.   All other systems are reviewed and negative.  ? ? ?PHYSICAL EXAM: ?VS:  BP 134/76   Pulse 74   Ht 5\' 5"  (1.651 m)   Wt 267 lb (121.1 kg)   SpO2 98%   BMI 44.43 kg/m?  , BMI Body mass index is 44.43 kg/m?. ?GENERAL:  Well appearing ?NECK:  No jugular venous distention, waveform within normal limits, carotid upstroke brisk and symmetric, no bruits, no thyromegaly ?LUNGS:  Clear to auscultation bilaterally ?CHEST:  Unremarkable ?HEART:  PMI not displaced or sustained,S1 and S2 within normal limits, no S3, no clicks, no rubs, no murmurs, irregular  ?ABD:  Flat, positive bowel sounds normal in frequency in pitch, no bruits, no rebound, no guarding, no midline pulsatile mass, no hepatomegaly, no splenomegaly ?EXT:  2 plus pulses throughout, no edema, no cyanosis no clubbing ? ? ?EKG:  EKG is not ordered today. ? ? ?Recent Labs: ?04/07/2021: Hemoglobin 14.5; Platelets 274 ?04/21/2021: BUN 20; Creatinine, Ser 1.40; Potassium 4.3; Sodium 139; TSH 3.98  ? ? ?Lipid Panel ?   ?Component Value Date/Time  ? CHOL 220 (H) 04/21/2021 1222  ? TRIG 169.0 (H) 04/21/2021 1222  ? HDL 46.20 04/21/2021 1222  ? CHOLHDL 5 04/21/2021 1222  ? VLDL 33.8 04/21/2021 1222  ? LDLCALC 140 (H) 04/21/2021 1222  ? ?  ? ?Wt Readings from Last 3 Encounters:  ?06/12/21 267 lb (121.1 kg)  ?04/21/21  263 lb (119.3 kg)  ?04/07/21 260 lb 9.6 oz (118.2 kg)  ?  ? ? ?Other studies Reviewed: ?Additional studies/ records that were reviewed today include: Labs ?Review of the above records demonstrates: See elsewhere ? ?ASSESSMENT AND PLAN: ? ? ?Chronic dyspnea on exertion:    She seems to be euvolemic.  She has had an extensive work-up.  I think  this is multifactorial.  No change in therapy. ? ?Atrial fibrillation:   [name] has a CHA2DS2 - VASc score of 4.   She has not wanted to consider ablation.  It sounds like her rate is controlled and we will continue anticoagulation.  ? ?Apical hypertrophic cardiomyopathy:  She had brief runs of NSVT not meeting criteria for high risk.  We talked again at length about genetic testing.  Her daughter was again in the office and we went over this.  She has no high risk findings for sudden cardiac death.  She has not wanted genetic testing.  ? ?CKD II:   Creatinine was stable at 1.4 last month.  No change in therapy. ? ?OSA:    She tried but has not tolerated CPAP. ? ?Morbid obesity: She understands this is a component and we have talked about weight loss. ? ?Hypertension:   Blood pressure is improved.  She has been very sensitive to medications.  No change in therapy. ? ?Fatigue: Thyroid was normal.  She is not anemic.  I suspect this is related to uncontrolled sleep apnea.  ? ?current medicines are reviewed at length with the patient today.  The patient does not have concerns regarding medicines. ? ?The following changes have been made:  None ? ?Labs/ tests ordered today include:   None ? ?No orders of the defined types were placed in this encounter. ? ? ? ?Disposition:   FU with me in 6 months ? ?Signed, ?Rollene Rotunda, MD  ?06/12/2021 12:40 PM    ?[facility] ?

## 2021-06-12 ENCOUNTER — Ambulatory Visit (INDEPENDENT_AMBULATORY_CARE_PROVIDER_SITE_OTHER): Payer: Medicare Other | Admitting: Cardiology

## 2021-06-12 ENCOUNTER — Encounter: Payer: Self-pay | Admitting: Cardiology

## 2021-06-12 ENCOUNTER — Ambulatory Visit (INDEPENDENT_AMBULATORY_CARE_PROVIDER_SITE_OTHER): Payer: Medicare Other

## 2021-06-12 VITALS — BP 134/76 | HR 74 | Ht 65.0 in | Wt 267.0 lb

## 2021-06-12 DIAGNOSIS — I2510 Atherosclerotic heart disease of native coronary artery without angina pectoris: Secondary | ICD-10-CM

## 2021-06-12 DIAGNOSIS — N182 Chronic kidney disease, stage 2 (mild): Secondary | ICD-10-CM

## 2021-06-12 DIAGNOSIS — Z7901 Long term (current) use of anticoagulants: Secondary | ICD-10-CM | POA: Diagnosis not present

## 2021-06-12 DIAGNOSIS — Z5181 Encounter for therapeutic drug level monitoring: Secondary | ICD-10-CM | POA: Diagnosis not present

## 2021-06-12 DIAGNOSIS — I4819 Other persistent atrial fibrillation: Secondary | ICD-10-CM | POA: Diagnosis not present

## 2021-06-12 DIAGNOSIS — I5032 Chronic diastolic (congestive) heart failure: Secondary | ICD-10-CM | POA: Diagnosis not present

## 2021-06-12 DIAGNOSIS — I4891 Unspecified atrial fibrillation: Secondary | ICD-10-CM

## 2021-06-12 DIAGNOSIS — I422 Other hypertrophic cardiomyopathy: Secondary | ICD-10-CM | POA: Diagnosis not present

## 2021-06-12 DIAGNOSIS — I10 Essential (primary) hypertension: Secondary | ICD-10-CM

## 2021-06-12 LAB — POCT INR: INR: 2 (ref 2.0–3.0)

## 2021-06-12 NOTE — Patient Instructions (Signed)
Medication Instructions:  Your physician recommends that you continue on your current medications as directed. Please refer to the Current Medication list given to you today.  *If you need a refill on your cardiac medications before your next appointment, please call your pharmacy*  Lab Work: NONE ordered at this time of appointment   If you have labs (blood work) drawn today and your tests are completely normal, you will receive your results only by: MyChart Message (if you have MyChart) OR A paper copy in the mail If you have any lab test that is abnormal or we need to change your treatment, we will call you to review the results.  Testing/Procedures: NONE ordered at this time of appointment   Follow-Up: At [facility], you and your health needs are our priority.  As part of our continuing mission to provide you with exceptional heart care, we have created designated Provider Care Teams.  These Care Teams include your primary Cardiologist (physician) and Advanced Practice Providers (APPs -  Physician Assistants and Nurse Practitioners) who all work together to provide you with the care you need, when you need it.   Your next appointment:   6 month(s)  The format for your next appointment:   In Person  Provider:   James Hochrein, MD    Other Instructions   Important Information About Sugar       

## 2021-06-12 NOTE — Patient Instructions (Signed)
Continue 1 tablet daily except for 1/2 tablet on Mondays, Wednesdays and Fridays. Recheck INR in 6 weeks. Coumadin clinic [phone].  ?

## 2021-06-14 ENCOUNTER — Ambulatory Visit: Payer: Medicare Other | Admitting: Cardiology

## 2021-06-20 ENCOUNTER — Telehealth: Payer: Self-pay | Admitting: Primary Care

## 2021-06-20 ENCOUNTER — Ambulatory Visit (INDEPENDENT_AMBULATORY_CARE_PROVIDER_SITE_OTHER): Payer: Medicare Other

## 2021-06-20 VITALS — Ht 65.0 in | Wt 260.0 lb

## 2021-06-20 DIAGNOSIS — Z0000 Encounter for general adult medical examination without abnormal findings: Secondary | ICD-10-CM | POA: Diagnosis not present

## 2021-06-20 NOTE — Telephone Encounter (Signed)
Spoke to patient to schedule AWV.  She wanted me to let you know  ?That the pravastatin you prescribed  she stated she felt really bad.  She spoke with her cardiology he stated to stop taking this.  She has not taken any since 06/12/21.  She states is feeling much better. ? ? ?She wanted to know if she still needs to do lab work on 07/26/21? ? ?Please advise   ? ?

## 2021-06-20 NOTE — Progress Notes (Signed)
?I connected with [name] today by telephone and verified that I am speaking with the correct person using two identifiers. ?Location patient: home ?Location provider: work ?Persons participating in the virtual visit: Andrey Farmer, Glenna Durand LPN. ?  ?I discussed the limitations, risks, security and privacy concerns of performing an evaluation and management service by telephone and the availability of in person appointments. I also discussed with the patient that there may be a patient responsible charge related to this service. The patient expressed understanding and verbally consented to this telephonic visit.  ?  ?Interactive audio and video telecommunications were attempted between this provider and patient, however failed, due to patient having technical difficulties OR patient did not have access to video capability.  We continued and completed visit with audio only. ? ?  ? ?Vital signs may be patient reported or missing. ? ?Subjective:  ? [name] is a 76 y.o. female who presents for Medicare Annual (Subsequent) preventive examination. ? ?Review of Systems    ? ?Cardiac Risk Factors include: advanced age (>59men, >69 women);hypertension;obesity (BMI >30kg/m2) ? ?   ?Objective:  ?  ?Today's Vitals  ? 06/20/21 1456 06/20/21 1457  ?Weight: 260 lb (117.9 kg)   ?Height: 5\' 5"  (1.651 m)   ?PainSc:  5   ? ?Body mass index is 43.27 kg/m?. ? ? ?  06/20/2021  ?  3:01 PM 12/10/2018  ? 12:05 AM 12/09/2018  ?  6:53 PM 11/02/2015  ? 11:00 AM 06/25/2014  ?  4:28 PM 06/25/2014  ? 10:58 AM 09/23/2013  ? 12:12 AM  ?Advanced Directives  ?Does Patient Have a Medical Advance Directive? Yes Yes Yes Yes Yes No Yes  ?Type of Paramedic of [place];Living will [place];Living will [place];Living will [place];Living will Living will;Healthcare Power of Attorney  Living will;Healthcare Power of Attorney  ?Does patient want to make changes to  medical advance directive?  No - Patient declined  No - Patient declined No - Patient declined  No - Patient declined  ?Copy of [place] in Chart? No - copy requested No - copy requested No - copy requested, Physician notified No - copy requested No - copy requested  No - copy requested  ?Would patient like information on creating a medical advance directive?     No - patient declined information No - patient declined information   ? ? ?Current Medications (verified) ?Outpatient Encounter Medications as of 06/20/2021  ?Medication Sig  ? acetaminophen (TYLENOL) 500 MG tablet Take 500-1,000 mg by mouth every 6 (six) hours as needed for headache.   ? diltiazem (CARDIZEM CD) 240 MG 24 hr capsule Take 1 capsule (240 mg total) by mouth daily.  ? furosemide (LASIX) 40 MG tablet Take 1 tablet (40 mg total) by mouth 2 (two) times daily.  ? magnesium oxide (MAG-OX) 400 MG tablet Take 1 tablet (400 mg total) by mouth daily.  ? Vitamin D, Ergocalciferol, (DRISDOL) 1.25 MG (50000 UNIT) CAPS capsule Take 1 capsule by mouth once weekly for 12 weeks.  ? warfarin (COUMADIN) 2.5 MG tablet TAKE 1/2 TO 1 TABLET DAILY AS DIRECTED BY COUMADIN CLINIC  ? pravastatin (PRAVACHOL) 40 MG tablet Take 1 tablet by mouth three times weekly for cholesterol. (Patient not taking: Reported on 06/20/2021)  ? ?No facility-administered encounter medications on file as of 06/20/2021.  ? ? ?Allergies (verified) ?Other and Statins  ? ?History: ?Past Medical History:  ?Diagnosis Date  ?  Arthritis   ? CAD (coronary artery disease)   ? a. 07/2008 NSTEMI ->Cath: LM nl, LAD 70p/90p, 61m, LCX nl, RCA nl, EF 60%.  The LAD was stented with a 2.25x80mm Veri-Flex BMS.  b. 8/15 cath patent stent, nonobstructive disease otherwise - unchanged. Elevated troponin felt due to demand ischemia in setting of AF-RVR. c. Elevated trop 06/2014 felt due to demand ischemia.  ? Chronic diastolic CHF (congestive heart failure) ([place])   ? a. 07/2008 Echo: EF 60-65%, Triv  AI/MR, Mild TR;  b. 09/2013 Echo: EF 60-65%, mild conc LVH.  ? Difficult intubation   ? Duodenitis   ? GERD (gastroesophageal reflux disease)   ? Headache(784.0)   ? History of positive PPD   ? Hyperlipidemia   ? Hypertension   ? Lactose intolerance   ? Morbid obesity ([place])   ? Osteoporosis   ? PAF (paroxysmal atrial fibrillation) ([place])   ? a. s/p multiple cardioversions;  b. 2010: on Tikosyn - dose decreased from 54mcg to 262mcg in setting of NSTEMI/QT prolongation. Did not hold NSR. c. admitted 08/2011 with loading at 517mcg BID with DCCV at that time;  d. Chronic coumadin (CHA2DS2VASc = 5). e. 06/2014: Phyllis Ginger stopped due to ineffective; started on amiodarone.  ? Renal insufficiency   ? a. Baseline Cr appears 1.1-1.2.  ? Rosacea   ? ?Past Surgical History:  ?Procedure Laterality Date  ? ABDOMINAL HYSTERECTOMY    ? partial, bleeding  ? CHOLECYSTECTOMY    ? CORONARY ANGIOPLASTY WITH STENT PLACEMENT  2010  ? 2.75- x 20-mm VeriFlex DES to the pLAD  ? CORONARY STENT PLACEMENT    ? ESOPHAGOGASTRODUODENOSCOPY  2003  ? KNEE ARTHROSCOPY  2000 & 2001  ? left and right  ? LEFT HEART CATHETERIZATION WITH CORONARY ANGIOGRAM N/A 09/24/2013  ? Procedure: LEFT HEART CATHETERIZATION WITH CORONARY ANGIOGRAM;  Surgeon: Blane Ohara, MD;  [facility] [place], LAD stent patent, mLAD 50-60%, CFX OK, RCA 30-40%, EF 70%  ? TOTAL KNEE ARTHROPLASTY    ? bilateral  ? TUBAL LIGATION    ? ?Family History  ?Problem Relation Age of Onset  ? Other Mother   ?     GI Bleed  ? Pneumonia Father   ? Coronary artery disease Brother   ? Breast cancer Paternal Grandmother   ? Rectal cancer Paternal Grandfather   ? ?Social History  ? ?Socioeconomic History  ? Marital status: Widowed  ?  Spouse name: Not on file  ? Number of children: 2  ? Years of education: Not on file  ? Highest education level: Not on file  ?Occupational History  ? Occupation: retired  ?  Employer: RETIRED  ?Tobacco Use  ? Smoking status: Never  ? Smokeless tobacco: Never  ?Vaping Use  ?  Vaping Use: Never used  ?Substance and Sexual Activity  ? Alcohol use: No  ?  Alcohol/week: 0.0 standard drinks  ? Drug use: No  ? Sexual activity: Not Currently  ?  Birth control/protection: Post-menopausal  ?Other Topics Concern  ? Not on file  ?Social History Narrative  ? Lives in [place], [place] w/ husband.  ? ?Social Determinants of Health  ? ?Financial Resource Strain: Low Risk   ? Difficulty of Paying Living Expenses: Not hard at all  ?Food Insecurity: No Food Insecurity  ? Worried About Charity fundraiser in the Last Year: Never true  ? Ran Out of Food in the Last Year: Never true  ?Transportation Needs: No Transportation Needs  ? Lack  of Transportation (Medical): No  ? Lack of Transportation (Non-Medical): No  ?Physical Activity: Inactive  ? Days of Exercise per Week: 0 days  ? Minutes of Exercise per Session: 0 min  ?Stress: No Stress Concern Present  ? Feeling of Stress : Not at all  ?Social Connections: Not on file  ? ? ?Tobacco Counseling ?Counseling given: Not Answered ? ? ?Clinical Intake: ? ?Pre-visit preparation completed: Yes ? ?Pain : 0-10 ?Pain Score: 5  ?Pain Type: Chronic pain ?Pain Location: Generalized ?Pain Descriptors / Indicators: Aching ?Pain Onset: More than a month ago ?Pain Frequency: Constant ? ?  ? ?Nutritional Status: BMI > 30  Obese ?Nutritional Risks: None ?Diabetes: No ? ?How often do you need to have someone help you when you read instructions, pamphlets, or other written materials from your doctor or pharmacy?: 1 - Never ?What is the last grade level you completed in school?: 9th grade ? ?Diabetic? no ? ?Interpreter Needed?: No ? ?Information entered by :: NAllen LPN ? ? ?Activities of Daily Living ? ?  06/20/2021  ?  3:02 PM 11/22/2020  ? 10:53 AM  ?In your present state of health, do you have any difficulty performing the following activities:  ?Hearing? 0 0  ?Vision? 0 1  ?Comment  followed by eye dr  ?Difficulty concentrating or making decisions? 0 0  ?Walking or climbing  stairs? 1 0  ?Comment SOB   ?Dressing or bathing? 0 0  ?Doing errands, shopping? 0 0  ?Preparing Food and eating ? N   ?Using the Toilet? N   ?In the past six months, have you accidently leaked urine? Y   ?Do you

## 2021-06-20 NOTE — Patient Instructions (Signed)
[name] , ?Thank you for taking time to come for your Medicare Wellness Visit. I appreciate your ongoing commitment to your health goals. Please review the following plan we discussed and let me know if I can assist you in the future.  ? ?Screening recommendations/referrals: ?Colonoscopy: not required ?Mammogram: not required ?Bone Density: due ?Recommended yearly ophthalmology/optometry visit for glaucoma screening and checkup ?Recommended yearly dental visit for hygiene and checkup ? ?Vaccinations: ?Influenza vaccine: due 09/05/2021 ?Pneumococcal vaccine: completed 11/02/2015 ?Tdap vaccine: due ?Shingles vaccine: discussed   ?Covid-19: 12/16/2019, 04/21/2019, 03/31/2019 ? ?Advanced directives: Please bring a copy of your POA (Power of Attorney) and/or Living Will to your next appointment.  ? ?Conditions/risks identified: none ? ?Next appointment: Follow up in one year for your annual wellness visit  ? ? ?Preventive Care 78 Years and Older, Female ?Preventive care refers to lifestyle choices and visits with your health care provider that can promote health and wellness. ?What does preventive care include? ?A yearly physical exam. This is also called an annual well check. ?Dental exams once or twice a year. ?Routine eye exams. Ask your health care provider how often you should have your eyes checked. ?Personal lifestyle choices, including: ?Daily care of your teeth and gums. ?Regular physical activity. ?Eating a healthy diet. ?Avoiding tobacco and drug use. ?Limiting alcohol use. ?Practicing safe sex. ?Taking low-dose aspirin every day. ?Taking vitamin and mineral supplements as recommended by your health care provider. ?What happens during an annual well check? ?The services and screenings done by your health care provider during your annual well check will depend on your age, overall health, lifestyle risk factors, and family history of disease. ?Counseling  ?Your health care provider may ask you questions about  your: ?Alcohol use. ?Tobacco use. ?Drug use. ?Emotional well-being. ?Home and relationship well-being. ?Sexual activity. ?Eating habits. ?History of falls. ?Memory and ability to understand (cognition). ?Work and work Statistician. ?Reproductive health. ?Screening  ?You may have the following tests or measurements: ?Height, weight, and BMI. ?Blood pressure. ?Lipid and cholesterol levels. These may be checked every 5 years, or more frequently if you are over 72 years old. ?Skin check. ?Lung cancer screening. You may have this screening every year starting at age 10 if you have a 30-pack-year history of smoking and currently smoke or have quit within the past 15 years. ?Fecal occult blood test (FOBT) of the stool. You may have this test every year starting at age 24. ?Flexible sigmoidoscopy or colonoscopy. You may have a sigmoidoscopy every 5 years or a colonoscopy every 10 years starting at age 51. ?Hepatitis C blood test. ?Hepatitis B blood test. ?Sexually transmitted disease (STD) testing. ?Diabetes screening. This is done by checking your blood sugar (glucose) after you have not eaten for a while (fasting). You may have this done every 1-3 years. ?Bone density scan. This is done to screen for osteoporosis. You may have this done starting at age 97. ?Mammogram. This may be done every 1-2 years. Talk to your health care provider about how often you should have regular mammograms. ?Talk with your health care provider about your test results, treatment options, and if necessary, the need for more tests. ?Vaccines  ?Your health care provider may recommend certain vaccines, such as: ?Influenza vaccine. This is recommended every year. ?Tetanus, diphtheria, and acellular pertussis (Tdap, Td) vaccine. You may need a Td booster every 10 years. ?Zoster vaccine. You may need this after age 6. ?Pneumococcal 13-valent conjugate (PCV13) vaccine. One dose is recommended after age  65. ?Pneumococcal polysaccharide (PPSV23) vaccine.  One dose is recommended after age 41. ?Talk to your health care provider about which screenings and vaccines you need and how often you need them. ?This information is not intended to replace advice given to you by your health care provider. Make sure you discuss any questions you have with your health care provider. ?Document Released: 02/18/2015 Document Revised: 10/12/2015 Document Reviewed: 11/23/2014 ?Elsevier Interactive Patient Education ? 2017 [place]. ? ?Fall Prevention in the Home ?Falls can cause injuries. They can happen to people of all ages. There are many things you can do to make your home safe and to help prevent falls. ?What can I do on the outside of my home? ?Regularly fix the edges of walkways and driveways and fix any cracks. ?Remove anything that might make you trip as you walk through a door, such as a raised step or threshold. ?Trim any bushes or trees on the path to your home. ?Use bright outdoor lighting. ?Clear any walking paths of anything that might make someone trip, such as rocks or tools. ?Regularly check to see if handrails are loose or broken. Make sure that both sides of any steps have handrails. ?Any raised decks and porches should have guardrails on the edges. ?Have any leaves, snow, or ice cleared regularly. ?Use sand or salt on walking paths during winter. ?Clean up any spills in your garage right away. This includes oil or grease spills. ?What can I do in the bathroom? ?Use night lights. ?Install grab bars by the toilet and in the tub and shower. Do not use towel bars as grab bars. ?Use non-skid mats or decals in the tub or shower. ?If you need to sit down in the shower, use a plastic, non-slip stool. ?Keep the floor dry. Clean up any water that spills on the floor as soon as it happens. ?Remove soap buildup in the tub or shower regularly. ?Attach bath mats securely with double-sided non-slip rug tape. ?Do not have throw rugs and other things on the floor that can make  you trip. ?What can I do in the bedroom? ?Use night lights. ?Make sure that you have a light by your bed that is easy to reach. ?Do not use any sheets or blankets that are too big for your bed. They should not hang down onto the floor. ?Have a firm chair that has side arms. You can use this for support while you get dressed. ?Do not have throw rugs and other things on the floor that can make you trip. ?What can I do in the kitchen? ?Clean up any spills right away. ?Avoid walking on wet floors. ?Keep items that you use a lot in easy-to-reach places. ?If you need to reach something above you, use a strong step stool that has a grab bar. ?Keep electrical cords out of the way. ?Do not use floor polish or wax that makes floors slippery. If you must use wax, use non-skid floor wax. ?Do not have throw rugs and other things on the floor that can make you trip. ?What can I do with my stairs? ?Do not leave any items on the stairs. ?Make sure that there are handrails on both sides of the stairs and use them. Fix handrails that are broken or loose. Make sure that handrails are as long as the stairways. ?Check any carpeting to make sure that it is firmly attached to the stairs. Fix any carpet that is loose or worn. ?Avoid having throw rugs at  the top or bottom of the stairs. If you do have throw rugs, attach them to the floor with carpet tape. ?Make sure that you have a light switch at the top of the stairs and the bottom of the stairs. If you do not have them, ask someone to add them for you. ?What else can I do to help prevent falls? ?Wear shoes that: ?Do not have high heels. ?Have rubber bottoms. ?Are comfortable and fit you well. ?Are closed at the toe. Do not wear sandals. ?If you use a stepladder: ?Make sure that it is fully opened. Do not climb a closed stepladder. ?Make sure that both sides of the stepladder are locked into place. ?Ask someone to hold it for you, if possible. ?Clearly mark and make sure that you can  see: ?Any grab bars or handrails. ?First and last steps. ?Where the edge of each step is. ?Use tools that help you move around (mobility aids) if they are needed. These include: ?Canes. ?Walkers. ?Scooters. ?C

## 2021-06-20 NOTE — Telephone Encounter (Signed)
Please thank patient for the update.  Please also wish her happy birthday. ? ?We will discontinue pravastatin.  Please cancel lab appointment for June. ?

## 2021-06-21 NOTE — Telephone Encounter (Signed)
Called patient reviewed all information and repeated back to me. Will call if any questions.  ?Have cancelled lab appointment.  ?

## 2021-07-13 ENCOUNTER — Other Ambulatory Visit: Payer: Self-pay | Admitting: Primary Care

## 2021-07-13 DIAGNOSIS — E559 Vitamin D deficiency, unspecified: Secondary | ICD-10-CM

## 2021-07-14 NOTE — Telephone Encounter (Signed)
Patient needs repeat labs, fasting. Looks like she cancelled the lab appointment for 07/26/21? Why is that? Can we schedule?

## 2021-07-18 NOTE — Telephone Encounter (Signed)
Please apologize for the misunderstanding. Yes, needs to come in for vitamin D, repeat calcium and parathyroid hormone testing. Lab only appt.  Is she taking any vitamin D? OTC? How much?>

## 2021-07-18 NOTE — Telephone Encounter (Signed)
Called patient states that she was told to canceled her lab app because she stopped the cholesterol medication. Did she need to come in for labs. States that she has not been taking any vit D after that script ran out.

## 2021-07-20 NOTE — Telephone Encounter (Signed)
Called patient lab appointment has been made. She is not taking any otc vit D at this time.

## 2021-07-21 ENCOUNTER — Other Ambulatory Visit (INDEPENDENT_AMBULATORY_CARE_PROVIDER_SITE_OTHER): Payer: Medicare Other

## 2021-07-21 DIAGNOSIS — E559 Vitamin D deficiency, unspecified: Secondary | ICD-10-CM | POA: Diagnosis not present

## 2021-07-21 LAB — VITAMIN D 25 HYDROXY (VIT D DEFICIENCY, FRACTURES): VITD: 33.51 ng/mL (ref 30.00–100.00)

## 2021-07-22 LAB — PTH, INTACT AND CALCIUM
Calcium: 10.3 mg/dL (ref 8.6–10.4)
PTH: 140 pg/mL — ABNORMAL HIGH (ref 16–77)

## 2021-07-23 ENCOUNTER — Other Ambulatory Visit: Payer: Self-pay | Admitting: Primary Care

## 2021-07-23 DIAGNOSIS — E559 Vitamin D deficiency, unspecified: Secondary | ICD-10-CM

## 2021-07-23 DIAGNOSIS — R7989 Other specified abnormal findings of blood chemistry: Secondary | ICD-10-CM

## 2021-07-23 MED ORDER — VITAMIN D (ERGOCALCIFEROL) 1.25 MG (50000 UNIT) PO CAPS: ORAL_CAPSULE | ORAL | 0 refills | Status: DC

## 2021-07-26 ENCOUNTER — Ambulatory Visit (INDEPENDENT_AMBULATORY_CARE_PROVIDER_SITE_OTHER): Payer: Medicare Other

## 2021-07-26 ENCOUNTER — Other Ambulatory Visit: Payer: Medicare Other

## 2021-07-26 DIAGNOSIS — Z7901 Long term (current) use of anticoagulants: Secondary | ICD-10-CM | POA: Diagnosis not present

## 2021-07-26 DIAGNOSIS — Z5181 Encounter for therapeutic drug level monitoring: Secondary | ICD-10-CM | POA: Diagnosis not present

## 2021-07-26 DIAGNOSIS — I4819 Other persistent atrial fibrillation: Secondary | ICD-10-CM | POA: Diagnosis not present

## 2021-07-26 DIAGNOSIS — I4891 Unspecified atrial fibrillation: Secondary | ICD-10-CM | POA: Diagnosis not present

## 2021-07-26 LAB — POCT INR: INR: 2 (ref 2.0–3.0)

## 2021-07-26 NOTE — Patient Instructions (Signed)
Description   Take 1 tablet today and then continue 1 tablet daily except for 1/2 tablet on Mondays, Wednesdays and Fridays.  Recheck INR in 7 weeks.  Coumadin clinic [phone].

## 2021-07-28 ENCOUNTER — Encounter: Payer: Self-pay | Admitting: *Deleted

## 2021-08-11 ENCOUNTER — Other Ambulatory Visit: Payer: Self-pay | Admitting: Cardiology

## 2021-09-13 ENCOUNTER — Ambulatory Visit (INDEPENDENT_AMBULATORY_CARE_PROVIDER_SITE_OTHER): Payer: Medicare Other | Admitting: *Deleted

## 2021-09-13 DIAGNOSIS — Z5181 Encounter for therapeutic drug level monitoring: Secondary | ICD-10-CM | POA: Diagnosis not present

## 2021-09-13 DIAGNOSIS — I4819 Other persistent atrial fibrillation: Secondary | ICD-10-CM | POA: Diagnosis not present

## 2021-09-13 DIAGNOSIS — Z7901 Long term (current) use of anticoagulants: Secondary | ICD-10-CM

## 2021-09-13 DIAGNOSIS — I4891 Unspecified atrial fibrillation: Secondary | ICD-10-CM

## 2021-09-13 LAB — POCT INR: INR: 2 (ref 2.0–3.0)

## 2021-09-13 NOTE — Patient Instructions (Signed)
Description   Take 1 tablet today and then continue 1 tablet daily except for 1/2 tablet on Mondays, Wednesdays and Fridays. Recheck INR in 8 weeks.  Coumadin clinic [phone].

## 2021-10-24 ENCOUNTER — Ambulatory Visit (INDEPENDENT_AMBULATORY_CARE_PROVIDER_SITE_OTHER)
Admission: RE | Admit: 2021-10-24 | Discharge: 2021-10-24 | Disposition: A | Discharge status: Home / Self Care | Payer: Medicare Other | Source: Ambulatory Visit | Attending: Family Medicine | Admitting: Family Medicine

## 2021-10-24 ENCOUNTER — Encounter: Payer: Self-pay | Admitting: Family Medicine

## 2021-10-24 ENCOUNTER — Ambulatory Visit (INDEPENDENT_AMBULATORY_CARE_PROVIDER_SITE_OTHER): Payer: Medicare Other | Admitting: Family Medicine

## 2021-10-24 VITALS — BP 140/80 | HR 117 | Temp 97.5°F | Wt 258.4 lb

## 2021-10-24 DIAGNOSIS — R051 Acute cough: Secondary | ICD-10-CM | POA: Diagnosis not present

## 2021-10-24 DIAGNOSIS — R062 Wheezing: Secondary | ICD-10-CM | POA: Diagnosis not present

## 2021-10-24 DIAGNOSIS — I482 Chronic atrial fibrillation, unspecified: Secondary | ICD-10-CM

## 2021-10-24 DIAGNOSIS — I10 Essential (primary) hypertension: Secondary | ICD-10-CM

## 2021-10-24 DIAGNOSIS — R059 Cough, unspecified: Secondary | ICD-10-CM | POA: Diagnosis not present

## 2021-10-24 MED ORDER — HYDROCOD POLI-CHLORPHE POLI ER 10-8 MG/5ML PO SUER: 5.0000 mL | Freq: Every evening | ORAL | 0 refills | Status: DC | PRN

## 2021-10-24 NOTE — Progress Notes (Signed)
Subjective:     [name] is a 76 y.o. female presenting for Cough (Dry, sore throat, nasal congestion x 4 days. Neg covid on Sunday. )     Cough This is a new problem. Episode onset: 10/19/2021. The problem has been gradually improving. The cough is Non-productive. Associated symptoms include headaches, nasal congestion, postnasal drip, a sore throat (resolved), shortness of breath (at baseline) and wheezing. Pertinent negatives include no chills, ear pain or fever. Nothing aggravates the symptoms. Treatments tried: mucinex, claritin, benadryl at night.   Pt with afib with sob Doing ok with PO intake  Did well with tussonex in the past  Review of Systems  Constitutional:  Negative for chills and fever.  HENT:  Positive for postnasal drip and sore throat (resolved). Negative for ear pain.   Respiratory:  Positive for cough, shortness of breath (at baseline) and wheezing.   Neurological:  Positive for headaches.     Social History   Tobacco Use  Smoking Status Never  Smokeless Tobacco Never        Objective:    BP Readings from Last 3 Encounters:  10/24/21 (!) 140/80  06/12/21 134/76  04/21/21 (!) 154/88   Wt Readings from Last 3 Encounters:  10/24/21 258 lb 6 oz (117.2 kg)  06/20/21 260 lb (117.9 kg)  06/12/21 267 lb (121.1 kg)    BP (!) 140/80   Pulse (!) 117   Temp (!) 97.5 F (36.4 C) (Temporal)   Wt 258 lb 6 oz (117.2 kg)   SpO2 96%   BMI 43.00 kg/m    Physical Exam Constitutional:      General: She is not in acute distress.    Appearance: She is well-developed. She is not diaphoretic.  HENT:     Head: Normocephalic and atraumatic.     Right Ear: Tympanic membrane and ear canal normal.     Left Ear: Tympanic membrane and ear canal normal.     Nose: Mucosal edema and rhinorrhea present.     Right Sinus: No maxillary sinus tenderness or frontal sinus tenderness.     Left Sinus: No maxillary sinus tenderness or frontal sinus tenderness.      Mouth/Throat:     Pharynx: Uvula midline. Posterior oropharyngeal erythema present. No oropharyngeal exudate.     Tonsils: 0 on the right. 0 on the left.  Eyes:     General: No scleral icterus.    Conjunctiva/sclera: Conjunctivae normal.  Cardiovascular:     Rate and Rhythm: Normal rate. Rhythm irregular.     Heart sounds: Normal heart sounds. No murmur heard. Pulmonary:     Effort: Pulmonary effort is normal. No respiratory distress.     Breath sounds: Wheezing (right lower lungs) present.  Musculoskeletal:     Cervical back: Neck supple.  Lymphadenopathy:     Cervical: No cervical adenopathy.  Skin:    General: Skin is warm and dry.     Capillary Refill: Capillary refill takes less than 2 seconds.  Neurological:     Mental Status: She is alert.     CXR (my read): increased airway thickening in the right lungs  DG Chest 2 View CLINICAL DATA:  Cough, wheezing.  EXAM: CHEST - 2 VIEW  COMPARISON:  May 14, 2019.  FINDINGS: Stable cardiomegaly. Both lungs are clear. The visualized skeletal structures are unremarkable.  IMPRESSION: No active cardiopulmonary disease.  Electronically Signed   By: Marijo Conception M.D.   On: 10/24/2021 13:31  Assessment & Plan:   Problem List Items Addressed This Visit       Cardiovascular and Mediastinum   Essential hypertension    Elevated in setting of cold.  Continue diltiazem 240 mg, furosemide 40 mg.      Chronic atrial fibrillation (HCC)    Mild tachycardia, improved on exam.  She is in A-fib.  Continue warfarin.      Other Visit Diagnoses     Acute cough    -  Primary   Relevant Medications   chlorpheniramine-HYDROcodone (TUSSIONEX) 10-8 MG/5ML   Other Relevant Orders   DG Chest 2 View (Completed)      Previously negative COVID, chest x-ray without signs of pneumonia.  Discussed signs and symptoms of sinus infection she will call if this occurs.  Tussionex as needed at night for cough, monitor for  sedation.   Return if symptoms worsen or fail to improve.  Lynnda Child, MD

## 2021-10-24 NOTE — Assessment & Plan Note (Signed)
Elevated in setting of cold.  Continue diltiazem 240 mg, furosemide 40 mg.

## 2021-10-24 NOTE — Assessment & Plan Note (Signed)
Mild tachycardia, improved on exam.  She is in A-fib.  Continue warfarin.

## 2021-10-24 NOTE — Patient Instructions (Addendum)
X-ray today  Based on your symptoms, it looks like you have a virus.   Antibiotics are not need for a viral infection but the following will help:   Drink plenty of fluids Get lots of rest  Sinus Congestion 1) Neti Pot (Saline rinse) -- 2 times day -- if tolerated 2) Flonase (Store Brand ok) - once daily   Cough 1) Cough drops can be helpful 2) Prescriptions  Sore Throat 1) Honey as above, cough drops 2) Ibuprofen or Aleve can be helpful 3) Salt water Gargles  If you develop fevers (Temperature >100.4), chills, worsening symptoms or symptoms lasting longer than 10 days return to clinic.

## 2021-11-08 ENCOUNTER — Ambulatory Visit: Payer: Medicare Other | Attending: Cardiology

## 2021-11-08 DIAGNOSIS — I4891 Unspecified atrial fibrillation: Secondary | ICD-10-CM

## 2021-11-08 DIAGNOSIS — Z5181 Encounter for therapeutic drug level monitoring: Secondary | ICD-10-CM | POA: Diagnosis not present

## 2021-11-08 DIAGNOSIS — I4819 Other persistent atrial fibrillation: Secondary | ICD-10-CM

## 2021-11-08 DIAGNOSIS — Z7901 Long term (current) use of anticoagulants: Secondary | ICD-10-CM | POA: Diagnosis not present

## 2021-11-08 LAB — POCT INR: INR: 1.9 — AB (ref 2.0–3.0)

## 2021-11-08 NOTE — Patient Instructions (Signed)
Description   Take 1 tablet today and then continue 1 tablet daily except for 1/2 tablet on Mondays, Wednesdays and Fridays. Recheck INR in 6 weeks.  Coumadin clinic [phone].

## 2021-11-12 ENCOUNTER — Other Ambulatory Visit: Payer: Self-pay | Admitting: Cardiology

## 2021-11-12 DIAGNOSIS — I4891 Unspecified atrial fibrillation: Secondary | ICD-10-CM

## 2021-11-12 DIAGNOSIS — Z7901 Long term (current) use of anticoagulants: Secondary | ICD-10-CM

## 2021-12-18 DIAGNOSIS — R5383 Other fatigue: Secondary | ICD-10-CM | POA: Insufficient documentation

## 2021-12-18 NOTE — Progress Notes (Unsigned)
Cardiology Office Note   Date:  12/20/2021   ID:  [name], DOB August 15, 1945, MRN [id number]  PCP:  Doreene Nest, NP  Cardiologist:   Rollene Rotunda, MD   Chief Complaint  Patient presents with   Shortness of Breath      History of Present Illness: [name] is a 76 y.o. female who presents for ongoing assessment and management of atrial fibrillation.  She had increased dyspnea.  Lexiscan Myoview was negative for ischemia.   Echo demonstrated severe LVH.  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   BNP was only mildly increased.   I increased the Cardizem.  She had an MIR and had evidence of an apical hypertrophic cardiomyopathy.  She had an apical aneurysm.  She had mild LGE.  She had 17 mm maximal thickness.   She has not wanted genetic testing.  Her son is seen by Dr. Graciela Husbands  The daughter does not want further evaluation.  I have seen her as well  She had a Zio patch and she has had no high risk findings.  I increased her Cardizem.   She actually feels better today than she has in a long time.  She says she just "adjusted my attitude.".  She still has some shortness of breath but it seems to be sporadic.  She is not describing any new palpitations, presyncope or syncope.  She is probably a little less fatigued.   Past Medical History:  Diagnosis Date   Arthritis    CAD (coronary artery disease)    a. 07/2008 NSTEMI ->Cath: LM nl, LAD 70p/90p, 75m, LCX nl, RCA nl, EF 60%.  The LAD was stented with a 2.25x92mm Veri-Flex BMS.  b. 8/15 cath patent stent, nonobstructive disease otherwise - unchanged. Elevated troponin felt due to demand ischemia in setting of AF-RVR. c. Elevated trop 06/2014 felt due to demand ischemia.   Chronic diastolic CHF (congestive heart failure) (HCC)    a. 07/2008 Echo: EF 60-65%, Triv AI/MR, Mild TR;  b. 09/2013 Echo: EF 60-65%, mild conc LVH.   Difficult intubation    Duodenitis    GERD (gastroesophageal reflux disease)    Headache(784.0)     History of positive PPD    Hyperlipidemia    Hypertension    Lactose intolerance    Morbid obesity (HCC)    Osteoporosis    PAF (paroxysmal atrial fibrillation) (HCC)    a. s/p multiple cardioversions;  b. 2010: on Tikosyn - dose decreased from to in setting of NSTEMI/QT prolongation. Did not hold NSR. c. admitted 08/2011 with loading at BID with DCCV at that time;  d. Chronic coumadin (CHA2DS2VASc = 5). e. 06/2014: Joice Lofts stopped due to ineffective; started on amiodarone.   Renal insufficiency    a. Baseline Cr appears 1.1-1.2.   Rosacea     Past Surgical History:  Procedure Laterality Date   ABDOMINAL HYSTERECTOMY     partial, bleeding   CHOLECYSTECTOMY     CORONARY ANGIOPLASTY WITH STENT PLACEMENT  2010   2.75- x 20-mm VeriFlex DES to the pLAD   CORONARY STENT PLACEMENT     ESOPHAGOGASTRODUODENOSCOPY  2003   KNEE ARTHROSCOPY  2000 & 2001   left and right   LEFT HEART CATHETERIZATION WITH CORONARY ANGIOGRAM N/A 09/24/2013   Procedure: LEFT HEART CATHETERIZATION WITH CORONARY ANGIOGRAM;  Surgeon: Micheline Chapman, MD;  Laqueta Jean OK, LAD stent patent, mLAD 50-60%, CFX OK, RCA 30-40%, EF 70%   TOTAL  KNEE ARTHROPLASTY     bilateral   TUBAL LIGATION       Current Outpatient Medications  Medication Sig Dispense Refill   acetaminophen (TYLENOL) 500 MG tablet Take 500-1,000 mg by mouth every 6 (six) hours as needed for headache.      diltiazem (CARDIZEM CD) 240 MG 24 hr capsule Take 1 capsule (240 mg total) by mouth daily. 90 capsule 3   diphenhydrAMINE HCl (BENADRYL ALLERGY PO) Take 1 each by mouth at bedtime as needed.     furosemide (LASIX) 40 MG tablet TAKE 1 TABLET BY MOUTH TWICE A DAY 180 tablet 3   loratadine (CLARITIN) 10 MG tablet Take 10 mg by mouth daily.     magnesium oxide (MAG-OX) 400 MG tablet Take 1 tablet (400 mg total) by mouth daily. 90 tablet 2   Vitamin D, Ergocalciferol, (DRISDOL) 1.25 MG (50000 UNIT) CAPS capsule Take 1 capsule by mouth once  weekly for 12 weeks. 12 capsule 0   warfarin (COUMADIN) 2.5 MG tablet TAKE 1/2 TO 1 TABLET DAILY AS DIRECTED BY COUMADIN CLINIC 90 tablet 1   No current facility-administered medications for this visit.    Allergies:   Other and Statins    ROS:  Please see the history of present illness.   Otherwise, review of systems are positive for as none.   All other systems are reviewed and negative.    PHYSICAL EXAM: VS:  BP (!) 148/66 (BP Location: Left Arm, Patient Position: Sitting, Cuff Size: Large)   Pulse 92   Ht 5\' 5"  (1.651 m)   Wt 262 lb 9.6 oz (119.1 kg)   SpO2 95%   BMI 43.70 kg/m  , BMI Body mass index is 43.7 kg/m. GENERAL:  Well appearing NECK:  No jugular venous distention, waveform within normal limits, carotid upstroke brisk and symmetric, no bruits, no thyromegaly LUNGS:  Clear to auscultation bilaterally CHEST:  Unremarkable HEART:  PMI not displaced or sustained,S1 and S2 within normal limits, no S3,  no clicks, no rubs, 2 out of 6 apical systolic murmur radiating slightly at aortic outflow tract, no diastolic murmurs, irregular ABD:  Flat, positive bowel sounds normal in frequency in pitch, no bruits, no rebound, no guarding, no midline pulsatile mass, no hepatomegaly, no splenomegaly EXT:  2 plus pulses throughout, no edema, no cyanosis no clubbing  EKG:  EKG is  ordered today. Atrial fibrillation, rate 73, axis within normal limits, intervals within normal limits, T wave inversions in the lateral leads consistent with repolarization and less pronounced than previous.  Recent Labs: 04/07/2021: Hemoglobin 14.5; Platelets 274 04/21/2021: BUN 20; Creatinine, Ser 1.40; Potassium 4.3; Sodium 139; TSH 3.98    Lipid Panel    Component Value Date/Time   CHOL 220 (H) 04/21/2021 1222   TRIG 169.0 (H) 04/21/2021 1222   HDL 46.20 04/21/2021 1222   CHOLHDL 5 04/21/2021 1222   VLDL 33.8 04/21/2021 1222   LDLCALC 140 (H) 04/21/2021 1222      Wt Readings from Last 3  Encounters:  12/20/21 262 lb 9.6 oz (119.1 kg)  10/24/21 258 lb 6 oz (117.2 kg)  06/20/21 260 lb (117.9 kg)      Other studies Reviewed: Additional studies/ records that were reviewed today include: None Review of the above records demonstrates:   NA   ASSESSMENT AND PLAN:   Chronic dyspnea on exertion:    This seems to be better than previous.  This is without any change in therapy.  I do believe it  is multifactorial but she is not having any acute symptoms and no change in therapy.   Atrial fibrillation:   [name] has a CHA2DS2 - VASc score of 4.  She tolerates anticoagulation.  She has not wanted to consider ablation.  I will check a CBC.  Cramping: I will check a basic metabolic profile and magnesium.  Apical hypertrophic cardiomyopathy: She has had no high risk findings with this.  She has not wanted genetic testing.  I talked to her about the familial transmission of this some asked her to share this information with her family.   CKD II:   Creatinine was 1.4 most recently and I will check a basic metabolic profile today.    OSA:    She has not wanted to use CPAP.  Morbid obesity:    We talked about weight loss many times.  Hypertension:   Her blood pressure is at target.  No change in therapy.    Fatigue: She has not been anemic.  Thyroid has been normal.  I suspect some uncontrolled sleep apnea but she would not want management of this.   Current medicines are reviewed at length with the patient today.  The patient does not have concerns regarding medicines.  The following changes have been made:  None  Labs/ tests ordered today include:      Orders Placed This Encounter  Procedures   Basic metabolic panel   CBC   Magnesium   EKG 12-Lead     Disposition:   FU with me in six   months  Signed, Rollene Rotunda, MD  12/20/2021 12:44 PM    [facility]

## 2021-12-20 ENCOUNTER — Ambulatory Visit: Payer: Medicare Other | Attending: Cardiology | Admitting: Cardiology

## 2021-12-20 ENCOUNTER — Encounter: Payer: Self-pay | Admitting: Cardiology

## 2021-12-20 ENCOUNTER — Ambulatory Visit (INDEPENDENT_AMBULATORY_CARE_PROVIDER_SITE_OTHER): Payer: Medicare Other | Admitting: *Deleted

## 2021-12-20 VITALS — BP 148/66 | HR 92 | Ht 65.0 in | Wt 262.6 lb

## 2021-12-20 DIAGNOSIS — R0602 Shortness of breath: Secondary | ICD-10-CM | POA: Diagnosis not present

## 2021-12-20 DIAGNOSIS — I2510 Atherosclerotic heart disease of native coronary artery without angina pectoris: Secondary | ICD-10-CM

## 2021-12-20 DIAGNOSIS — I4891 Unspecified atrial fibrillation: Secondary | ICD-10-CM | POA: Insufficient documentation

## 2021-12-20 DIAGNOSIS — Z5181 Encounter for therapeutic drug level monitoring: Secondary | ICD-10-CM

## 2021-12-20 DIAGNOSIS — I4819 Other persistent atrial fibrillation: Secondary | ICD-10-CM | POA: Insufficient documentation

## 2021-12-20 DIAGNOSIS — N182 Chronic kidney disease, stage 2 (mild): Secondary | ICD-10-CM | POA: Insufficient documentation

## 2021-12-20 DIAGNOSIS — R5383 Other fatigue: Secondary | ICD-10-CM | POA: Insufficient documentation

## 2021-12-20 DIAGNOSIS — Z7901 Long term (current) use of anticoagulants: Secondary | ICD-10-CM | POA: Diagnosis not present

## 2021-12-20 DIAGNOSIS — I422 Other hypertrophic cardiomyopathy: Secondary | ICD-10-CM | POA: Insufficient documentation

## 2021-12-20 LAB — POCT INR: INR: 2 (ref 2.0–3.0)

## 2021-12-20 NOTE — Patient Instructions (Signed)
Description   Take 1 tablet today and then continue 1 tablet daily except for 1/2 tablet on Mondays, Wednesdays and Fridays. Recheck INR in 6 weeks. Coumadin clinic [phone].

## 2021-12-20 NOTE — Patient Instructions (Addendum)
Medication Instructions:  No changes  *If you need a refill on your cardiac medications before your next appointment, please call your pharmacy*   Lab Work: BMP CBC Magnesium  If you have labs (blood work) drawn today and your tests are completely normal, you will receive your results only by: MyChart Message (if you have MyChart) OR A paper copy in the mail If you have any lab test that is abnormal or we need to change your treatment, we will call you to review the results.   Testing/Procedures: Not needed   Follow-Up: At [facility], you and your health needs are our priority.  As part of our continuing mission to provide you with exceptional heart care, we have created designated Provider Care Teams.  These Care Teams include your primary Cardiologist (physician) and Advanced Practice Providers (APPs -  Physician Assistants and Nurse Practitioners) who all work together to provide you with the care you need, when you need it.     Your next appointment:   12 month(s)  The format for your next appointment:   In Person  Provider:   Rollene Rotunda, MD

## 2021-12-21 LAB — CBC
Hematocrit: 44.2 % (ref 34.0–46.6)
Hemoglobin: 14.8 g/dL (ref 11.1–15.9)
MCH: 27.8 pg (ref 26.6–33.0)
MCHC: 33.5 g/dL (ref 31.5–35.7)
MCV: 83 fL (ref 79–97)
Platelets: 288 10*3/uL (ref 150–450)
RBC: 5.32 x10E6/uL — ABNORMAL HIGH (ref 3.77–5.28)
RDW: 13.5 % (ref 11.7–15.4)
WBC: 9.2 10*3/uL (ref 3.4–10.8)

## 2021-12-21 LAB — BASIC METABOLIC PANEL
BUN/Creatinine Ratio: 13 (ref 12–28)
BUN: 20 mg/dL (ref 8–27)
CO2: 27 mmol/L (ref 20–29)
Calcium: 10.6 mg/dL — ABNORMAL HIGH (ref 8.7–10.3)
Chloride: 102 mmol/L (ref 96–106)
Creatinine, Ser: 1.5 mg/dL — ABNORMAL HIGH (ref 0.57–1.00)
Glucose: 89 mg/dL (ref 70–99)
Potassium: 4.5 mmol/L (ref 3.5–5.2)
Sodium: 143 mmol/L (ref 134–144)
eGFR: 35 mL/min/{1.73_m2} — ABNORMAL LOW (ref >59)

## 2021-12-21 LAB — MAGNESIUM: Magnesium: 2.1 mg/dL (ref 1.6–2.3)

## 2022-01-19 ENCOUNTER — Other Ambulatory Visit: Payer: Self-pay | Admitting: Cardiology

## 2022-02-08 ENCOUNTER — Ambulatory Visit (INDEPENDENT_AMBULATORY_CARE_PROVIDER_SITE_OTHER): Payer: Medicare Other

## 2022-02-08 ENCOUNTER — Ambulatory Visit: Payer: Medicare Other | Attending: Cardiology | Admitting: *Deleted

## 2022-02-08 DIAGNOSIS — I4891 Unspecified atrial fibrillation: Secondary | ICD-10-CM

## 2022-02-08 DIAGNOSIS — I4819 Other persistent atrial fibrillation: Secondary | ICD-10-CM

## 2022-02-08 DIAGNOSIS — Z7901 Long term (current) use of anticoagulants: Secondary | ICD-10-CM

## 2022-02-08 DIAGNOSIS — Z5181 Encounter for therapeutic drug level monitoring: Secondary | ICD-10-CM | POA: Diagnosis not present

## 2022-02-08 DIAGNOSIS — Z23 Encounter for immunization: Secondary | ICD-10-CM | POA: Diagnosis not present

## 2022-02-08 LAB — POCT INR: INR: 2.4 (ref 2.0–3.0)

## 2022-02-08 MED ORDER — WARFARIN SODIUM 2.5 MG PO TABS: ORAL_TABLET | ORAL | 1 refills | Status: DC

## 2022-02-08 NOTE — Patient Instructions (Signed)
Description   Continue taking warfarin 1 tablet daily except for 1/2 tablet on Mondays, Wednesdays and Fridays. Recheck INR in 6 weeks. Coumadin clinic [phone].

## 2022-03-22 ENCOUNTER — Ambulatory Visit: Payer: Medicare Other | Attending: Cardiology

## 2022-03-22 DIAGNOSIS — I4891 Unspecified atrial fibrillation: Secondary | ICD-10-CM | POA: Diagnosis not present

## 2022-03-22 DIAGNOSIS — Z5181 Encounter for therapeutic drug level monitoring: Secondary | ICD-10-CM | POA: Diagnosis not present

## 2022-03-22 DIAGNOSIS — I4819 Other persistent atrial fibrillation: Secondary | ICD-10-CM | POA: Diagnosis not present

## 2022-03-22 DIAGNOSIS — Z7901 Long term (current) use of anticoagulants: Secondary | ICD-10-CM | POA: Diagnosis not present

## 2022-03-22 LAB — POCT INR: INR: 2.4 (ref 2.0–3.0)

## 2022-03-22 NOTE — Patient Instructions (Signed)
Description   Continue taking warfarin 1 tablet daily except for 1/2 tablet on Mondays, Wednesdays and Fridays. Recheck INR in 7 weeks. Coumadin clinic [phone].

## 2022-04-10 ENCOUNTER — Other Ambulatory Visit: Payer: Self-pay | Admitting: Cardiology

## 2022-04-18 DIAGNOSIS — H35031 Hypertensive retinopathy, right eye: Secondary | ICD-10-CM | POA: Diagnosis not present

## 2022-04-18 DIAGNOSIS — Z961 Presence of intraocular lens: Secondary | ICD-10-CM | POA: Diagnosis not present

## 2022-04-18 DIAGNOSIS — H40011 Open angle with borderline findings, low risk, right eye: Secondary | ICD-10-CM | POA: Diagnosis not present

## 2022-04-18 DIAGNOSIS — H2522 Age-related cataract, morgagnian type, left eye: Secondary | ICD-10-CM | POA: Diagnosis not present

## 2022-05-10 ENCOUNTER — Ambulatory Visit: Payer: Medicare Other | Attending: Cardiology | Admitting: *Deleted

## 2022-05-10 DIAGNOSIS — I4891 Unspecified atrial fibrillation: Secondary | ICD-10-CM | POA: Insufficient documentation

## 2022-05-10 DIAGNOSIS — I4819 Other persistent atrial fibrillation: Secondary | ICD-10-CM | POA: Diagnosis not present

## 2022-05-10 DIAGNOSIS — Z7901 Long term (current) use of anticoagulants: Secondary | ICD-10-CM | POA: Insufficient documentation

## 2022-05-10 DIAGNOSIS — Z5181 Encounter for therapeutic drug level monitoring: Secondary | ICD-10-CM | POA: Diagnosis not present

## 2022-05-10 LAB — POCT INR: INR: 2.2 (ref 2.0–3.0)

## 2022-05-10 NOTE — Patient Instructions (Signed)
Description   Continue taking warfarin 1 tablet daily except for 1/2 tablet on Mondays, Wednesdays and Fridays. Recheck INR in 8 weeks. Coumadin clinic [phone].

## 2022-07-05 ENCOUNTER — Ambulatory Visit: Payer: Medicare Other | Attending: Cardiology

## 2022-07-05 DIAGNOSIS — Z5181 Encounter for therapeutic drug level monitoring: Secondary | ICD-10-CM | POA: Diagnosis not present

## 2022-07-05 DIAGNOSIS — I4891 Unspecified atrial fibrillation: Secondary | ICD-10-CM | POA: Diagnosis not present

## 2022-07-05 DIAGNOSIS — Z7901 Long term (current) use of anticoagulants: Secondary | ICD-10-CM | POA: Diagnosis not present

## 2022-07-05 DIAGNOSIS — I4819 Other persistent atrial fibrillation: Secondary | ICD-10-CM | POA: Diagnosis not present

## 2022-07-05 LAB — POCT INR: INR: 2.1 (ref 2.0–3.0)

## 2022-07-05 NOTE — Patient Instructions (Signed)
Continue taking warfarin 1 tablet daily except for 1/2 tablet on Mondays, Wednesdays and Fridays. Recheck INR in 8 weeks. Coumadin clinic [phone].

## 2022-08-30 ENCOUNTER — Ambulatory Visit: Payer: Medicare Other | Attending: Cardiology

## 2022-08-30 DIAGNOSIS — Z5181 Encounter for therapeutic drug level monitoring: Secondary | ICD-10-CM | POA: Insufficient documentation

## 2022-08-30 DIAGNOSIS — Z7901 Long term (current) use of anticoagulants: Secondary | ICD-10-CM | POA: Diagnosis not present

## 2022-08-30 DIAGNOSIS — I4819 Other persistent atrial fibrillation: Secondary | ICD-10-CM | POA: Diagnosis not present

## 2022-08-30 DIAGNOSIS — I4891 Unspecified atrial fibrillation: Secondary | ICD-10-CM | POA: Diagnosis not present

## 2022-08-30 LAB — POCT INR: INR: 2.1 (ref 2.0–3.0)

## 2022-08-30 NOTE — Patient Instructions (Signed)
Description   Continue taking warfarin 1 tablet daily except for 1/2 tablet on Mondays, Wednesdays and Fridays. Recheck INR in 8 weeks. Coumadin clinic [phone].

## 2022-09-20 ENCOUNTER — Other Ambulatory Visit: Payer: Self-pay | Admitting: Cardiology

## 2022-10-01 ENCOUNTER — Ambulatory Visit (INDEPENDENT_AMBULATORY_CARE_PROVIDER_SITE_OTHER): Payer: Medicare Other

## 2022-10-01 VITALS — BP 132/76 | HR 98 | Wt 262.0 lb

## 2022-10-01 DIAGNOSIS — Z0000 Encounter for general adult medical examination without abnormal findings: Secondary | ICD-10-CM | POA: Diagnosis not present

## 2022-10-01 NOTE — Patient Instructions (Signed)
[name] , Thank you for taking time to come for your Medicare Wellness Visit. I appreciate your ongoing commitment to your health goals. Please review the following plan we discussed and let me know if I can assist you in the future.   Referrals/Orders/Follow-Ups/Clinician Recommendations: continue to lose weight pt declined bone density at this time   This is a list of the screening recommended for you and due dates:  Health Maintenance  Topic Date Due   DEXA scan (bone density measurement)  Never done   DTaP/Tdap/Td vaccine (2 - Tdap) 02/06/2015   Flu Shot  09/06/2022   Medicare Annual Wellness Visit  10/01/2023   Pneumonia Vaccine  Completed   Hepatitis C Screening  Completed   HPV Vaccine  Aged Out   Colon Cancer Screening  Discontinued   COVID-19 Vaccine  Discontinued   Zoster (Shingles) Vaccine  Discontinued    Advanced directives: (Copy Requested) Please bring a copy of your health care power of attorney and living will to the office to be added to your chart at your convenience.  Next Medicare Annual Wellness Visit scheduled for next year: Yes

## 2022-10-01 NOTE — Progress Notes (Signed)
Subjective:   [name] is a 77 y.o. female who presents for Medicare Annual (Subsequent) preventive examination.  Visit Complete: Virtual  I connected with  [name] on 10/01/22 by a audio enabled telemedicine application and verified that I am speaking with the correct person using two identifiers.  Patient Location: Home  Provider Location: Office/Clinic  I discussed the limitations of evaluation and management by telemedicine. The patient expressed understanding and agreed to proceed.  Patient Medicare AWV questionnaire was completed by the patient on 09/30/22; I have confirmed that all information answered by patient is correct and no changes since this date.  Vital Signs: Vital signs are patient reported.   Review of Systems     Cardiac Risk Factors include: advanced age (>108men, >60 women);hypertension;obesity (BMI >30kg/m2)     Objective:    Today's Vitals   10/01/22 0806  BP: 132/76  Pulse: 98  Weight: 262 lb (118.8 kg)   Body mass index is 43.6 kg/m.     10/01/2022    8:10 AM 06/20/2021    3:01 PM 12/10/2018   12:05 AM 12/09/2018    6:53 PM 11/02/2015   11:00 AM 06/25/2014    4:28 PM 06/25/2014   10:58 AM  Advanced Directives  Does Patient Have a Medical Advance Directive? Yes Yes Yes Yes Yes Yes No  Type of Estate agent of [place];Living will Healthcare Power of [place];Living will Healthcare Power of [place];Living will Healthcare Power of [place];Living will Healthcare Power of [place];Living will Living will;Healthcare Power of Attorney   Does patient want to make changes to medical advance directive?   No - Patient declined  No - Patient declined No - Patient declined   Copy of Healthcare Power of Attorney in Chart? No - copy requested No - copy requested No - copy requested No - copy requested, Physician notified No - copy requested No - copy requested   Would patient like information on creating a medical advance  directive?      No - patient declined information No - patient declined information    Current Medications (verified) Outpatient Encounter Medications as of 10/01/2022  Medication Sig   acetaminophen (TYLENOL) 500 MG tablet Take 500-1,000 mg by mouth every 6 (six) hours as needed for headache.    diltiazem (CARDIZEM CD) 240 MG 24 hr capsule TAKE 1 CAPSULE BY MOUTH EVERY DAY   diphenhydrAMINE HCl (BENADRYL ALLERGY PO) Take 1 each by mouth at bedtime as needed.   furosemide (LASIX) 40 MG tablet TAKE 1 TABLET BY MOUTH TWICE A DAY   loratadine (CLARITIN) 10 MG tablet Take 10 mg by mouth daily.   magnesium oxide (MAG-OX) 400 MG tablet TAKE 1 TABLET BY MOUTH EVERY DAY   warfarin (COUMADIN) 2.5 MG tablet TAKE 1/2 TO 1 TABLET DAILY AS DIRECTED BY COUMADIN CLINIC   [DISCONTINUED] Vitamin D, Ergocalciferol, (DRISDOL) 1.25 MG (50000 UNIT) CAPS capsule Take 1 capsule by mouth once weekly for 12 weeks.   No facility-administered encounter medications on file as of 10/01/2022.    Allergies (verified) Other and Statins   History: Past Medical History:  Diagnosis Date   Arthritis    CAD (coronary artery disease)    a. 07/2008 NSTEMI ->Cath: LM nl, LAD 70p/90p, 77m, LCX nl, RCA nl, EF 60%.  The LAD was stented with a 2.25x90mm Veri-Flex BMS.  b. 8/15 cath patent stent, nonobstructive disease otherwise - unchanged. Elevated troponin felt due to demand ischemia in setting of AF-RVR. c.  Elevated trop 06/2014 felt due to demand ischemia.   Chronic diastolic CHF (congestive heart failure) (HCC)    a. 07/2008 Echo: EF 60-65%, Triv AI/MR, Mild TR;  b. 09/2013 Echo: EF 60-65%, mild conc LVH.   Difficult intubation    Duodenitis    GERD (gastroesophageal reflux disease)    Headache(784.0)    History of positive PPD    Hyperlipidemia    Hypertension    Lactose intolerance    Morbid obesity (HCC)    Osteoporosis    PAF (paroxysmal atrial fibrillation) (HCC)    a. s/p multiple cardioversions;  b. 2010: on  Tikosyn - dose decreased from to in setting of NSTEMI/QT prolongation. Did not hold NSR. c. admitted 08/2011 with loading at BID with DCCV at that time;  d. Chronic coumadin (CHA2DS2VASc = 5). e. 06/2014: Joice Lofts stopped due to ineffective; started on amiodarone.   Renal insufficiency    a. Baseline Cr appears 1.1-1.2.   Rosacea    Past Surgical History:  Procedure Laterality Date   ABDOMINAL HYSTERECTOMY     partial, bleeding   CHOLECYSTECTOMY     CORONARY ANGIOPLASTY WITH STENT PLACEMENT  2010   2.75- x 20-mm VeriFlex DES to the pLAD   CORONARY STENT PLACEMENT     ESOPHAGOGASTRODUODENOSCOPY  2003   KNEE ARTHROSCOPY  2000 & 2001   left and right   LEFT HEART CATHETERIZATION WITH CORONARY ANGIOGRAM N/A 09/24/2013   Procedure: LEFT HEART CATHETERIZATION WITH CORONARY ANGIOGRAM;  Surgeon: Micheline Chapman, MD;  Laqueta Jean OK, LAD stent patent, mLAD 50-60%, CFX OK, RCA 30-40%, EF 70%   TOTAL KNEE ARTHROPLASTY     bilateral   TUBAL LIGATION     Family History  Problem Relation Age of Onset   Other Mother        GI Bleed   Pneumonia Father    Coronary artery disease Brother    Breast cancer Paternal Grandmother    Rectal cancer Paternal Grandfather    Social History   Socioeconomic History   Marital status: Widowed    Spouse name: Not on file   Number of children: 2   Years of education: Not on file   Highest education level: Not on file  Occupational History   Occupation: retired    Associate Professor: RETIRED  Tobacco Use   Smoking status: Never   Smokeless tobacco: Never  Vaping Use   Vaping status: Never Used  Substance and Sexual Activity   Alcohol use: No    Alcohol/week: 0.0 standard drinks of alcohol   Drug use: No   Sexual activity: Not Currently    Birth control/protection: Post-menopausal  Other Topics Concern   Not on file  Social History Narrative   Lives in [place], [place] w/ husband.   Social Determinants of Health   Financial Resource Strain: Low  Risk  (09/30/2022)   Overall Financial Resource Strain (CARDIA)    Difficulty of Paying Living Expenses: Not very hard  Food Insecurity: No Food Insecurity (09/30/2022)   Hunger Vital Sign    Worried About Running Out of Food in the Last Year: Never true    Ran Out of Food in the Last Year: Never true  Transportation Needs: No Transportation Needs (09/30/2022)   PRAPARE - Administrator, Civil Service (Medical): No    Lack of Transportation (Non-Medical): No  Physical Activity: Inactive (09/30/2022)   Exercise Vital Sign    Days of Exercise per Week: 0 days  Minutes of Exercise per Session: 0 min  Stress: No Stress Concern Present (09/30/2022)   [employer] of Occupational Health - Occupational Stress Questionnaire    Feeling of Stress : Only a little  Social Connections: Unknown (09/30/2022)   Social Connection and Isolation Panel [NHANES]    Frequency of Communication with Friends and Family: More than three times a week    Frequency of Social Gatherings with Friends and Family: Three times a week    Attends Religious Services: Patient declined    Active Member of Clubs or Organizations: No    Attends Banker Meetings: Never    Marital Status: Widowed    Tobacco Counseling Counseling given: Not Answered   Clinical Intake:  Pre-visit preparation completed: Yes  Pain : No/denies pain     BMI - recorded: 43.6 Nutritional Status: BMI > 30  Obese Nutritional Risks: None Diabetes: No  How often do you need to have someone help you when you read instructions, pamphlets, or other written materials from your doctor or pharmacy?: 1 - Never  Interpreter Needed?: No  Information entered by :: Lanier Ensign, LPN   Activities of Daily Living    09/30/2022    9:40 AM  In your present state of health, do you have any difficulty performing the following activities:  Hearing? 0  Vision? 0  Difficulty concentrating or making decisions? 0  Walking  or climbing stairs? 1  Dressing or bathing? 0  Doing errands, shopping? 0  Preparing Food and eating ? N  Using the Toilet? N  In the past six months, have you accidently leaked urine? Y  Comment pt wears a pad  Do you have problems with loss of bowel control? N  Managing your Medications? N  Managing your Finances? N  Housekeeping or managing your Housekeeping? N    Patient Care Team: Doreene Nest, NP as PCP - General (Internal Medicine) Rollene Rotunda, MD as PCP - Cardiology (Cardiology) Oretha Milch, MD (Pulmonary Disease) Newman Nip, NP (Inactive) as Nurse Practitioner (Nurse Practitioner) Nelson Chimes, MD as Consulting Physician (Ophthalmology) Rollene Rotunda, MD as Consulting Physician (Cardiology) Hillis Range, MD (Inactive) as Consulting Physician (Cardiology)  Indicate any recent Medical Services you may have received from other than [facility] providers in the past year (date may be approximate).     Assessment:   This is a routine wellness examination for Shanteria.  Hearing/Vision screen Hearing Screening - Comments:: Pt denies any hearing issues  Vision Screening - Comments:: Pt follows up with [facility]   Dietary issues and exercise activities discussed:     Goals Addressed             This Visit's Progress    Patient Stated       Lose weight        Depression Screen    10/01/2022    8:09 AM 06/20/2021    3:02 PM 11/22/2020   10:52 AM 06/04/2017   11:54 AM 11/02/2015   11:02 AM 03/19/2014   10:51 AM 11/16/2013    9:35 AM  PHQ 2/9 Scores  PHQ - 2 Score 0 0 0 0 0 0 0  PHQ- 9 Score   0        Fall Risk    09/30/2022    9:40 AM 06/20/2021    3:01 PM 11/22/2020   10:52 AM 06/04/2017   11:54 AM 11/02/2015   11:02 AM  Fall Risk   Falls in  the past year? 0 0 0 No Yes  Comment     09/2015 pt tripped over a chair leg causing injury to left thumb  Number falls in past yr: 0 0 0  1  Injury with Fall? 0 0 0  Yes  Comment      medical treatment sought after fall; x-ray completed; no fracture in thumb; brace worn on thumb for 1 wk  Risk for fall due to : Impaired vision Medication side effect     Follow up Falls prevention discussed Falls evaluation completed;Education provided;Falls prevention discussed   Falls evaluation completed;Falls prevention discussed    MEDICARE RISK AT HOME: Medicare Risk at Home Any stairs in or around the home?: Yes If so, are there any without handrails?: No Home free of loose throw rugs in walkways, pet beds, electrical cords, etc?: Yes Adequate lighting in your home to reduce risk of falls?: Yes Life alert?: No Use of a cane, walker or w/c?: No Grab bars in the bathroom?: Yes Shower chair or bench in shower?: No Elevated toilet seat or a handicapped toilet?: Yes  TIMED UP AND GO:  Was the test performed?  no    Cognitive Function:    11/02/2015   11:08 AM  MMSE - Mini Mental State Exam  Orientation to time 5  Orientation to Place 5  Registration 3  Attention/ Calculation 0  Recall 3  Language- name 2 objects 0  Language- repeat 1  Language- follow 3 step command 3  Language- read & follow direction 0  Write a sentence 0  Copy design 0  Total score 20        10/01/2022    8:12 AM 06/20/2021    3:04 PM  6CIT Screen  What Year? 0 points 0 points  What month? 0 points 0 points  What time? 0 points 0 points  Count back from 20 0 points 0 points  Months in reverse 0 points 0 points  Repeat phrase 0 points 0 points  Total Score 0 points 0 points    Immunizations Immunization History  Administered Date(s) Administered   Fluad Quad(high Dose 65+) 10/16/2018, 12/16/2019, 02/08/2022   Influenza Split 10/22/2011   Influenza Whole 02/05/2005, 12/04/2007, 11/06/2010   Influenza, High Dose Seasonal PF 11/01/2014, 11/15/2016, 11/09/2020   Influenza,inj,Quad PF,6+ Mos 10/20/2013, 11/06/2015, 11/05/2017   Influenza-Unspecified 11/05/2012   PFIZER(Purple  Top)SARS-COV-2 Vaccination 03/31/2019, 04/21/2019, 12/16/2019   Pneumococcal Conjugate-13 11/02/2015   Pneumococcal Polysaccharide-23 09/24/2013   Td 02/05/2005    TDAP status: Due, Education has been provided regarding the importance of this vaccine. Advised may receive this vaccine at local pharmacy or Health Dept. Aware to provide a copy of the vaccination record if obtained from local pharmacy or Health Dept. Verbalized acceptance and understanding.  Flu Vaccine status: Due, Education has been provided regarding the importance of this vaccine. Advised may receive this vaccine at local pharmacy or Health Dept. Aware to provide a copy of the vaccination record if obtained from local pharmacy or Health Dept. Verbalized acceptance and understanding.  Pneumococcal vaccine status: Up to date  Covid-19 vaccine status: Information provided on how to obtain vaccines.   Qualifies for Shingles Vaccine? Yes   Zostavax completed No   Shingrix Completed?: No.    Education has been provided regarding the importance of this vaccine. Patient has been advised to call insurance company to determine out of pocket expense if they have not yet received this vaccine. Advised may also receive vaccine at local  pharmacy or Health Dept. Verbalized acceptance and understanding.  Screening Tests Health Maintenance  Topic Date Due   DEXA SCAN  Never done   DTaP/Tdap/Td (2 - Tdap) 02/06/2015   INFLUENZA VACCINE  09/06/2022   Medicare Annual Wellness (AWV)  10/01/2023   Pneumonia Vaccine 82+ Years old  Completed   Hepatitis C Screening  Completed   HPV VACCINES  Aged Out   Colonoscopy  Discontinued   COVID-19 Vaccine  Discontinued   Zoster Vaccines- Shingrix  Discontinued    Health Maintenance  Health Maintenance Due  Topic Date Due   DEXA SCAN  Never done   DTaP/Tdap/Td (2 - Tdap) 02/06/2015   INFLUENZA VACCINE  09/06/2022    Colorectal cancer screening: No longer required.   Mammogram status:  Completed 11/08/15. Repeat every year    Additional Screening:  Hepatitis C Screening:  Completed 11/02/15  Vision Screening: Recommended annual ophthalmology exams for early detection of glaucoma and other disorders of the eye. Is the patient up to date with their annual eye exam?  Yes  Who is the provider or what is the name of the office in which the patient attends annual eye exams? [facility]  If pt is not established with a provider, would they like to be referred to a provider to establish care? No .   Dental Screening: Recommended annual dental exams for proper oral hygiene   Community Resource Referral / Chronic Care Management: CRR required this visit?  No   CCM required this visit?  No     Plan:     I have personally reviewed and noted the following in the patient's chart:   Medical and social history Use of alcohol, tobacco or illicit drugs  Current medications and supplements including opioid prescriptions. Patient is not currently taking opioid prescriptions. Functional ability and status Nutritional status Physical activity Advanced directives List of other physicians Hospitalizations, surgeries, and ER visits in previous 12 months Vitals Screenings to include cognitive, depression, and falls Referrals and appointments  In addition, I have reviewed and discussed with patient certain preventive protocols, quality metrics, and best practice recommendations. A written personalized care plan for preventive services as well as general preventive health recommendations were provided to patient.     Marzella Schlein, LPN   05/14/8117   After Visit Summary: (MyChart) Due to this being a telephonic visit, the after visit summary with patients personalized plan was offered to patient via MyChart   Nurse Notes: none

## 2022-10-13 ENCOUNTER — Other Ambulatory Visit: Payer: Self-pay | Admitting: Cardiology

## 2022-10-25 ENCOUNTER — Ambulatory Visit: Payer: Medicare Other

## 2022-11-09 ENCOUNTER — Ambulatory Visit: Payer: Medicare Other | Attending: Cardiology

## 2022-11-09 DIAGNOSIS — Z7901 Long term (current) use of anticoagulants: Secondary | ICD-10-CM | POA: Insufficient documentation

## 2022-11-09 DIAGNOSIS — I4819 Other persistent atrial fibrillation: Secondary | ICD-10-CM | POA: Insufficient documentation

## 2022-11-09 DIAGNOSIS — I4891 Unspecified atrial fibrillation: Secondary | ICD-10-CM | POA: Insufficient documentation

## 2022-11-09 DIAGNOSIS — Z5181 Encounter for therapeutic drug level monitoring: Secondary | ICD-10-CM | POA: Insufficient documentation

## 2022-11-09 LAB — POCT INR: INR: 2.5 (ref 2.0–3.0)

## 2022-11-09 NOTE — Patient Instructions (Signed)
Description   Continue taking warfarin 1 tablet daily except for 1/2 tablet on Mondays, Wednesdays and Fridays. Recheck INR in 8 weeks. Coumadin clinic [phone].

## 2022-11-23 ENCOUNTER — Encounter: Payer: Self-pay | Admitting: Primary Care

## 2022-11-23 ENCOUNTER — Ambulatory Visit (INDEPENDENT_AMBULATORY_CARE_PROVIDER_SITE_OTHER): Payer: Medicare Other | Admitting: Primary Care

## 2022-11-23 VITALS — BP 148/84 | HR 75 | Temp 98.2°F | Ht 65.0 in | Wt 264.0 lb

## 2022-11-23 DIAGNOSIS — R21 Rash and other nonspecific skin eruption: Secondary | ICD-10-CM | POA: Diagnosis not present

## 2022-11-23 MED ORDER — PREDNISONE 20 MG PO TABS
ORAL_TABLET | ORAL | 0 refills | Status: DC
Start: 2022-11-23 — End: 2023-01-09

## 2022-11-23 MED ORDER — TRIAMCINOLONE ACETONIDE 0.5 % EX OINT
1.0000 | TOPICAL_OINTMENT | Freq: Two times a day (BID) | CUTANEOUS | 0 refills | Status: DC
Start: 2022-11-23 — End: 2023-12-19

## 2022-11-23 NOTE — Assessment & Plan Note (Signed)
Exam today representative of dermatitis without cellulitis.  Start prednisone tablets. Take two tablets my mouth once daily in the morning for four days, then one tablet once daily in the morning for four days.  Start triamcinolone 0.5% ointment twice daily as needed.  She will notify her Coumadin nurse regarding the prednisone.  Follow-up as needed.

## 2022-11-23 NOTE — Progress Notes (Signed)
Subjective:    Patient ID: [name], female    DOB: December 04, 1945, 77 y.o.   MRN: [id number]  Rash Pertinent negatives include no fever or shortness of breath.    Sterling B Kruck is a very pleasant 77 y.o. female with a history of hypertension, CAD with NSTEMI, chronic atrial fibrillation, OSA, osteoarthritis, CKD, hyperlipidemia, fatigue who presents today to discuss rash.  Her rash began about 10 days ago and is located to the bilateral upper extremities from the mid forearm to the mid humeral region. Left is worse than right. Her rash is itchy and sometimes burns.   No new lotions, detergents, soaps or shampoos. No new medicines, vitamins, supplements. No new pets. No recent outdoor exposure or poison ivy exposure. No bonfire or smoke exposure.  No recent motel or hotel stay or new beds.   No fevers/chills, oral lesions, new joint pains, tick bites, abdominal pain, nausea.   She's applied OTC hydrocortisone with temporary improvement.   She has chronic itching to her back. She resumed her Claritin yesterday.   She has a history of the same type of rash to her upper extremities about 10 years ago. Was diagnosed with dermatitis.   Review of Systems  Constitutional:  Negative for fever.  Respiratory:  Negative for shortness of breath.   Skin:  Positive for color change and rash. Negative for wound.         Past Medical History:  Diagnosis Date   Arthritis    CAD (coronary artery disease)    a. 07/2008 NSTEMI ->Cath: LM nl, LAD 70p/90p, 27m, LCX nl, RCA nl, EF 60%.  The LAD was stented with a 2.25x62mm Veri-Flex BMS.  b. 8/15 cath patent stent, nonobstructive disease otherwise - unchanged. Elevated troponin felt due to demand ischemia in setting of AF-RVR. c. Elevated trop 06/2014 felt due to demand ischemia.   Chronic diastolic CHF (congestive heart failure) (HCC)    a. 07/2008 Echo: EF 60-65%, Triv AI/MR, Mild TR;  b. 09/2013 Echo: EF 60-65%, mild conc LVH.   Difficult  intubation    Duodenitis    GERD (gastroesophageal reflux disease)    Headache(784.0)    History of positive PPD    Hyperlipidemia    Hypertension    Lactose intolerance    Morbid obesity (HCC)    Osteoporosis    PAF (paroxysmal atrial fibrillation) (HCC)    a. s/p multiple cardioversions;  b. 2010: on Tikosyn - dose decreased from to in setting of NSTEMI/QT prolongation. Did not hold NSR. c. admitted 08/2011 with loading at BID with DCCV at that time;  d. Chronic coumadin (CHA2DS2VASc = 5). e. 06/2014: Joice Lofts stopped due to ineffective; started on amiodarone.   Renal insufficiency    a. Baseline Cr appears 1.1-1.2.   Rosacea     Social History   Socioeconomic History   Marital status: Widowed    Spouse name: Not on file   Number of children: 2   Years of education: Not on file   Highest education level: Not on file  Occupational History   Occupation: retired    Associate Professor: RETIRED  Tobacco Use   Smoking status: Never   Smokeless tobacco: Never  Vaping Use   Vaping status: Never Used  Substance and Sexual Activity   Alcohol use: No    Alcohol/week: 0.0 standard drinks of alcohol   Drug use: No   Sexual activity: Not Currently    Birth control/protection: Post-menopausal  Other Topics  Concern   Not on file  Social History Narrative   Lives in [place], [place] w/ husband.   Social Determinants of Health   Financial Resource Strain: Low Risk  (09/30/2022)   Overall Financial Resource Strain (CARDIA)    Difficulty of Paying Living Expenses: Not very hard  Food Insecurity: No Food Insecurity (09/30/2022)   Hunger Vital Sign    Worried About Running Out of Food in the Last Year: Never true    Ran Out of Food in the Last Year: Never true  Transportation Needs: No Transportation Needs (09/30/2022)   PRAPARE - Administrator, Civil Service (Medical): No    Lack of Transportation (Non-Medical): No  Physical Activity: Inactive (09/30/2022)   Exercise  Vital Sign    Days of Exercise per Week: 0 days    Minutes of Exercise per Session: 0 min  Stress: No Stress Concern Present (09/30/2022)   [employer] of Occupational Health - Occupational Stress Questionnaire    Feeling of Stress : Only a little  Social Connections: Unknown (09/30/2022)   Social Connection and Isolation Panel [NHANES]    Frequency of Communication with Friends and Family: More than three times a week    Frequency of Social Gatherings with Friends and Family: Three times a week    Attends Religious Services: Patient declined    Active Member of Clubs or Organizations: No    Attends Banker Meetings: Never    Marital Status: Widowed  Intimate Partner Violence: Not At Risk (10/01/2022)   Humiliation, Afraid, Rape, and Kick questionnaire    Fear of Current or Ex-Partner: No    Emotionally Abused: No    Physically Abused: No    Sexually Abused: No    Past Surgical History:  Procedure Laterality Date   ABDOMINAL HYSTERECTOMY     partial, bleeding   CHOLECYSTECTOMY     CORONARY ANGIOPLASTY WITH STENT PLACEMENT  2010   2.75- x 20-mm VeriFlex DES to the pLAD   CORONARY STENT PLACEMENT     ESOPHAGOGASTRODUODENOSCOPY  2003   KNEE ARTHROSCOPY  2000 & 2001   left and right   LEFT HEART CATHETERIZATION WITH CORONARY ANGIOGRAM N/A 09/24/2013   Procedure: LEFT HEART CATHETERIZATION WITH CORONARY ANGIOGRAM;  Surgeon: Micheline Chapman, MD;  Laqueta Jean OK, LAD stent patent, mLAD 50-60%, CFX OK, RCA 30-40%, EF 70%   TOTAL KNEE ARTHROPLASTY     bilateral   TUBAL LIGATION      Family History  Problem Relation Age of Onset   Other Mother        GI Bleed   Pneumonia Father    Coronary artery disease Brother    Breast cancer Paternal Grandmother    Rectal cancer Paternal Grandfather     Allergies  Allergen Reactions   Other Other (See Comments)    Most antibiotics cause yeast infections- Keflex IS tolerated   Statins Other (See Comments)    Muscle and joint  pain    Current Outpatient Medications on File Prior to Visit  Medication Sig Dispense Refill   acetaminophen (TYLENOL) 500 MG tablet Take 500-1,000 mg by mouth every 6 (six) hours as needed for headache.      diltiazem (CARDIZEM CD) 240 MG 24 hr capsule TAKE 1 CAPSULE BY MOUTH EVERY DAY 90 capsule 2   furosemide (LASIX) 40 MG tablet TAKE 1 TABLET BY MOUTH TWICE A DAY 180 tablet 3   loratadine (CLARITIN) 10 MG tablet Take 10 mg by mouth daily.  magnesium oxide (MAG-OX) 400 MG tablet TAKE 1 TABLET BY MOUTH EVERY DAY 90 tablet 2   warfarin (COUMADIN) 2.5 MG tablet TAKE 1/2 TO 1 TABLET DAILY AS DIRECTED BY COUMADIN CLINIC 90 tablet 1   diphenhydrAMINE HCl (BENADRYL ALLERGY PO) Take 1 each by mouth at bedtime as needed. (Patient not taking: Reported on 11/23/2022)     No current facility-administered medications on file prior to visit.    BP (!) 148/84   Pulse 75   Temp 98.2 F (36.8 C) (Temporal)   Ht 5\' 5"  (1.651 m)   Wt 264 lb (119.7 kg)   SpO2 98%   BMI 43.93 kg/m  Objective:   Physical Exam Constitutional:      General: She is not in acute distress. Cardiovascular:     Rate and Rhythm: Normal rate. Rhythm irregular.  Pulmonary:     Effort: Pulmonary effort is normal.     Breath sounds: Normal breath sounds.  Skin:    General: Skin is warm and dry.     Findings: Rash present.     Comments: Moderate erythematous rash to left mid forearm through left mid humeral region. Mild erythematous rash to right mid forearm through left mid humeral region.  No vesicles or papules.  No rash noted to posterior trunk  Neurological:     Mental Status: She is alert.           Assessment & Plan:  Rash and nonspecific skin eruption Assessment & Plan: Exam today representative of dermatitis without cellulitis.  Start prednisone tablets. Take two tablets my mouth once daily in the morning for four days, then one tablet once daily in the morning for four days.  Start  triamcinolone 0.5% ointment twice daily as needed.  She will notify her Coumadin nurse regarding the prednisone.  Follow-up as needed.  Orders: -     predniSONE; Take two tablets my mouth once daily in the morning for four days, then one tablet once daily in the morning for four days.  Dispense: 12 tablet; Refill: 0 -     Triamcinolone Acetonide; Apply 1 Application topically 2 (two) times daily.  Dispense: 30 g; Refill: 0        Doreene Nest, NP

## 2022-11-23 NOTE — Patient Instructions (Addendum)
Start prednisone tablets. Take two tablets my mouth once daily in the morning for four days, then one tablet once daily in the morning for four days.   You may apply the triamcinolone ointment twice daily for 1 week then as needed.  Continue Claritin to see if this helps with the itching of your back.  It was a pleasure to see you today!

## 2022-11-28 ENCOUNTER — Ambulatory Visit: Payer: Medicare Other | Attending: Cardiology

## 2022-11-28 DIAGNOSIS — I4819 Other persistent atrial fibrillation: Secondary | ICD-10-CM | POA: Diagnosis not present

## 2022-11-28 DIAGNOSIS — I4891 Unspecified atrial fibrillation: Secondary | ICD-10-CM

## 2022-11-28 DIAGNOSIS — Z5181 Encounter for therapeutic drug level monitoring: Secondary | ICD-10-CM

## 2022-11-28 DIAGNOSIS — Z7901 Long term (current) use of anticoagulants: Secondary | ICD-10-CM

## 2022-11-28 LAB — POCT INR: INR: 2.1 (ref 2.0–3.0)

## 2022-11-28 NOTE — Patient Instructions (Signed)
Description   Continue taking warfarin 1 tablet daily except for 1/2 tablet on Mondays, Wednesdays and Fridays. Recheck INR in 3 weeks.  Coumadin clinic [phone].

## 2022-12-06 DIAGNOSIS — Z23 Encounter for immunization: Secondary | ICD-10-CM | POA: Diagnosis not present

## 2022-12-18 ENCOUNTER — Other Ambulatory Visit: Payer: Self-pay | Admitting: Cardiology

## 2022-12-20 ENCOUNTER — Other Ambulatory Visit: Payer: Self-pay | Admitting: Cardiology

## 2022-12-20 DIAGNOSIS — Z7901 Long term (current) use of anticoagulants: Secondary | ICD-10-CM

## 2022-12-20 DIAGNOSIS — I4891 Unspecified atrial fibrillation: Secondary | ICD-10-CM

## 2022-12-20 NOTE — Telephone Encounter (Signed)
Warfarin 2.5mg  refill Afib Last INR 11/28/22 Last OV 12/20/21 & has appt pending on 12/21/22

## 2022-12-20 NOTE — Progress Notes (Signed)
Cardiology Office Note:   Date:  12/21/2022  ID:  [name], DOB [birth year], MRN [id number] PCP: Doreene Nest, NP  [facility] Providers Cardiologist:  Rollene Rotunda, MD {  History of Present Illness:   [name] is a 77 y.o. female who presents for ongoing assessment and management of atrial fibrillation.  She had increased dyspnea.  Lexiscan Myoview was negative for ischemia.   Echo demonstrated severe LVH.  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   BNP was only mildly increased.   I increased the Cardizem.  She had an MRI and had evidence of an apical hypertrophic cardiomyopathy.  She had an apical aneurysm.  She had mild LGE.  She had 17 mm maximal thickness.   She has not wanted genetic testing.  Her son is seen by Dr. Graciela Husbands  The daughter does not want further evaluation.   She had a Zio patch and she has had no high risk findings.  I increased her Cardizem.    She continues to have shortness of breath.  She reports that this is keeping her from doing most activities and has been a persistent problem we discussed for years.  She is not describing new PND or orthopnea though she chronically sleeps in a recliner.  She has sleep apnea but does not want to use CPAP.  She is not having any new chest pressure, neck or arm discomfort.  She had chronic lower extremity swelling.  Of note she is asking now about fibrillation ablation.  She saw Dr. Johney Frame previously.  He had offered her ablation a few years ago quoting the success rate to be lower given her chronic fibrillation and she and I had a discussion about the difficulties and success rate in people with untreated sleep apnea or obesity.  However, she might get benefit from this and has failed Tikosyn and amiodarone in the past  ROS: As stated in the HPI and negative for all other systems.  Studies Reviewed:    EKG:   EKG Interpretation Date/Time:  Friday December 21 2022 11:35:08 EST Ventricular Rate:  88 PR  Interval:    QRS Duration:  90 QT Interval:  364 QTC Calculation: 440 R Axis:   14  Text Interpretation: Atrial fibrillation Left ventricular hypertrophy with repolarization abnormality ( R in aVL , Romhilt-Estes ) When compared with ECG of 09-Dec-2018 18:39,  rate is faster Confirmed by Rollene Rotunda ([id number]) on 12/21/2022 12:00:47 PM    Risk Assessment/Calculations:    CHA2DS2-VASc Score = 5   This indicates a 7.2% annual risk of stroke. The patient's score is based upon: CHF History: 1 HTN History: 1 Diabetes History: 0 Stroke History: 0 Vascular Disease History: 0 Age Score: 2 Gender Score: 1   Physical Exam:   VS:  BP 126/78   Pulse 88   Ht 5\' 5"  (1.651 m)   Wt 261 lb 6.4 oz (118.6 kg)   SpO2 98%   BMI 43.50 kg/m    Wt Readings from Last 3 Encounters:  12/21/22 261 lb 6.4 oz (118.6 kg)  11/23/22 264 lb (119.7 kg)  10/01/22 262 lb (118.8 kg)     GEN: Well nourished, well developed in no acute distress NECK: No JVD; No carotid bruits CARDIAC: Irregular RR, 3 out of 6 apical systolic murmur increasing slightly with the strain phase of Valsalva, no diastolic murmurs, rubs, gallops RESPIRATORY:  Clear to auscultation without rales, wheezing or rhonchi  ABDOMEN: Soft, non-tender, non-distended EXTREMITIES:  Mild to moderate leg , nonpitting edema; No deformity   ASSESSMENT AND PLAN:   Chronic dyspnea on exertion: I think this is probably multifactorial.  She definitely has hypertrophic cardiomyopathy and certainly some of her shortness of breath could be related to this, diastolic dysfunction compounded by her atrial fibrillation.  I do not think further change in medication will help as it has not been successful previously but again we talked about possibility of trying to get her back in sinus rhythm.  Meds will remain as listed.  See below for further discussion.    Atrial fibrillation:   She has failed Tikosyn and amiodarone.  She was being considered and was  offered ablation few years ago and now consents to exploring this again.  I will refer her for EP evaluation.  Cramping: She had normal potassium and magnesium at the last visit.  No change in therapy.   Apical hypertrophic cardiomyopathy: I have not identified high risk findings with this.  She has not wanted genetic testing.  Her family is aware as documented elsewhere. .    CKD II:   Creatinine was 1.5 which is unchanged from previous.   OSA:   She has consistently not wanted to use CPAP.  Morbid obesity:    We have talked about weight loss strategies many times.   Hypertension:   Her blood pressure is at target.  No change in therapy.          Follow up with me in six months.   Signed, Rollene Rotunda, MD

## 2022-12-21 ENCOUNTER — Ambulatory Visit (INDEPENDENT_AMBULATORY_CARE_PROVIDER_SITE_OTHER): Payer: Medicare Other

## 2022-12-21 ENCOUNTER — Ambulatory Visit: Payer: Medicare Other | Attending: Cardiology | Admitting: Cardiology

## 2022-12-21 ENCOUNTER — Encounter: Payer: Self-pay | Admitting: Cardiology

## 2022-12-21 VITALS — BP 126/78 | HR 88 | Ht 65.0 in | Wt 261.4 lb

## 2022-12-21 DIAGNOSIS — R0602 Shortness of breath: Secondary | ICD-10-CM | POA: Diagnosis not present

## 2022-12-21 DIAGNOSIS — I4819 Other persistent atrial fibrillation: Secondary | ICD-10-CM | POA: Insufficient documentation

## 2022-12-21 DIAGNOSIS — I422 Other hypertrophic cardiomyopathy: Secondary | ICD-10-CM

## 2022-12-21 DIAGNOSIS — I4811 Longstanding persistent atrial fibrillation: Secondary | ICD-10-CM | POA: Diagnosis not present

## 2022-12-21 DIAGNOSIS — Z5181 Encounter for therapeutic drug level monitoring: Secondary | ICD-10-CM | POA: Insufficient documentation

## 2022-12-21 DIAGNOSIS — I4891 Unspecified atrial fibrillation: Secondary | ICD-10-CM

## 2022-12-21 DIAGNOSIS — I10 Essential (primary) hypertension: Secondary | ICD-10-CM

## 2022-12-21 DIAGNOSIS — Z7901 Long term (current) use of anticoagulants: Secondary | ICD-10-CM | POA: Insufficient documentation

## 2022-12-21 LAB — POCT INR: INR: 2.3 (ref 2.0–3.0)

## 2022-12-21 NOTE — Patient Instructions (Signed)
Description   Continue taking warfarin 1 tablet daily except for 1/2 tablet on Mondays, Wednesdays and Fridays. Recheck INR in 6 weeks. Coumadin clinic [phone].

## 2022-12-21 NOTE — Patient Instructions (Signed)
Medication Instructions:  Your physician recommends that you continue on your current medications as directed. Please refer to the Current Medication list given to you today.  *If you need a refill on your cardiac medications before your next appointment, please call your pharmacy*  Lab Work: If you have labs (blood work) drawn today and your tests are completely normal, you will receive your results only by: MyChart Message (if you have MyChart) OR A paper copy in the mail If you have any lab test that is abnormal or we need to change your treatment, we will call you to review the results.  Testing/Procedures: None ordered today.  Follow-Up: At [facility], you and your health needs are our priority.  As part of our continuing mission to provide you with exceptional heart care, we have created designated Provider Care Teams.  These Care Teams include your primary Cardiologist (physician) and Advanced Practice Providers (APPs -  Physician Assistants and Nurse Practitioners) who all work together to provide you with the care you need, when you need it.  We recommend signing up for the patient portal called "MyChart".  Sign up information is provided on this After Visit Summary.  MyChart is used to connect with patients for Virtual Visits (Telemedicine).  Patients are able to view lab/test results, encounter notes, upcoming appointments, etc.  Non-urgent messages can be sent to your provider as well.   To learn more about what you can do with MyChart, go to [email or web address].    Your next appointment:   6 month(s)  Provider:   Rollene Rotunda, MD     You have been referred to Dr. Elberta Fortis next available to discuss atrial fibrillation ablation.

## 2023-01-09 ENCOUNTER — Encounter: Payer: Self-pay | Admitting: Cardiovascular Disease

## 2023-01-09 ENCOUNTER — Ambulatory Visit: Payer: Medicare Other | Attending: Cardiovascular Disease | Admitting: Cardiovascular Disease

## 2023-01-09 VITALS — BP 152/74 | HR 90 | Ht 65.0 in | Wt 260.4 lb

## 2023-01-09 DIAGNOSIS — I4891 Unspecified atrial fibrillation: Secondary | ICD-10-CM | POA: Diagnosis not present

## 2023-01-09 NOTE — Patient Instructions (Signed)
Medication Instructions:  Your physician recommends that you continue on your current medications as directed. Please refer to the Current Medication list given to you today. *If you need a refill on your cardiac medications before your next appointment, please call your pharmacy*   Follow-Up: At [facility], you and your health needs are our priority.  As part of our continuing mission to provide you with exceptional heart care, we have created designated Provider Care Teams.  These Care Teams include your primary Cardiologist (physician) and Advanced Practice Providers (APPs -  Physician Assistants and Nurse Practitioners) who all work together to provide you with the care you need, when you need it.  We recommend signing up for the patient portal called "MyChart".  Sign up information is provided on this After Visit Summary.  MyChart is used to connect with patients for Virtual Visits (Telemedicine).  Patients are able to view lab/test results, encounter notes, upcoming appointments, etc.  Non-urgent messages can be sent to your provider as well.   To learn more about what you can do with MyChart, go to [email or web address].    Your next appointment:   Schedule follow up with Dr. Antoine Poche for May 2025  Provider:   Rollene Rotunda, MD

## 2023-01-09 NOTE — Progress Notes (Signed)
Electrophysiology Office Note:    Date:  01/09/2023   ID:  [name], DOB 09-Apr-1945, MRN [id number]  PCP:  Doreene Nest, NP   [facility] Providers Cardiologist:  Rollene Rotunda, MD     Referring MD: Rollene Rotunda, MD   History of Present Illness:    [name] is a 78 y.o. female with a medical history significant for persistent atrial fibrillation, obstructive sleep apnea, subarachnoid hemorrhage, CKD stage II, elevated TSH, referred for atrial fibrillation management.      She has had increased dyspnea.  Stress test did not show any evidence of ischemia.  Echo showed severe LVH and possible apical aneurysm.  MRI showed evidence of apical hypertrophic cardiomyopathy and an apical aneurysm.  There was mild LGE.  The maximal LV thickness of 17 mm.  She declined genetic testing.  She has had persistent shortness of breath for years that prevents her from doing some of her activities.  She sleeps in a recliner.  She is diagnosed with sleep apnea but does not use CPAP.  She was inquiring about atrial fibrillation ablation.  Dr. Johney Frame had offered her an ablation in the past though he quoted a decrease success rate given the chronicity of her A-fib at that time.     Today, she is at baseline.  She has chronic knee on exertion.  No palpitations  EKGs/Labs/Other Studies Reviewed Today:     Echocardiogram:  TTE July 10, 2019 Severe LVH of the apical segments consistent with apical hypertrophic cardiomyopathy with an apical aneurysm.  EF 60 to 65%.  Left atrial size moderately dilated.  Right atrial size mildly dilated.   Monitors:  3 day Zio monitor 04/2021  -- my interpretation Atrial fibrillation HR 46-207, avg 71 100% AF burden 2 episodes of NSVT occurred, longer 7s   Advanced imaging:  Cardiac MRI Apical variant hypertrophic cardiomyopathy.  17 mm maximal thickness in the anterior anteroseptal wall.  Mild diffuse delayed myocardial enhancement in  the area of the maximal wall thickness.  Early apical pouch formation at the LV apex.  LVEF 72% abnormal aortic valve with partial fusion of the right and left coronary cusps.    EKG:   EKG Interpretation Date/Time:  Wednesday January 09 2023 10:02:37 EST Ventricular Rate:  90 PR Interval:    QRS Duration:  96 QT Interval:  354 QTC Calculation: 433 R Axis:   36  Text Interpretation: Atrial fibrillation ST & T wave abnormality, consider lateral ischemia When compared with ECG of 21-Dec-2022 11:35, No significant change was found Confirmed by York Pellant [phone]) on 01/09/2023 10:23:40 AM     Physical Exam:    VS:  BP (!) 152/74   Pulse 90   Ht 5\' 5"  (1.651 m)   Wt 260 lb 6.4 oz (118.1 kg)   SpO2 98%   BMI 43.33 kg/m     Wt Readings from Last 3 Encounters:  01/09/23 260 lb 6.4 oz (118.1 kg)  12/21/22 261 lb 6.4 oz (118.6 kg)  11/23/22 264 lb (119.7 kg)     GEN:  Well nourished, well developed in no acute distress. Obese CARDIAC: iRRR, no murmurs, rubs, gallops RESPIRATORY:  Normal work of breathing MUSCULOSKELETAL: chronic-appearing edema    ASSESSMENT & PLAN:     Atrial fibrillation Longstanding persistent -- the last sinus rhythm ECG in [facility] dates back to 2016. The earliest ECG with AF is from 2006 She has failed Tikosyn and amiodarone With obesity, untreated sleep apnea,  persistent A-fib for 8 years, dilated atria, I think her likelihood of maintenance of sinus rhythm is very low.  The only remaining option for rhythm control would be ablation, which would be increased risk due to her elevated BMI I recommend continuing rate control Continue warfarin for secondary hypercoagulable state  Obesity We discussed the association between obesity and refractory atrial fibrillation  Untreated sleep apnea We discussed the associate between untreated sleep apnea and refractory ablation  Apical variant hypertrophic cardiomyopathy Typically considered high risk for  ventricular arrhythmia, the absence of sustained ventricular arrhythmia to-date at age 77 outweighs other considereations. I would not offer ICD  I will be happy to see her again if there are any questions or concerns, or new issues arise.    Signed, Maurice Small, MD  01/09/2023 10:38 AM    [facility]

## 2023-02-08 ENCOUNTER — Ambulatory Visit: Payer: Medicare Other | Attending: Cardiology | Admitting: *Deleted

## 2023-02-08 DIAGNOSIS — I4819 Other persistent atrial fibrillation: Secondary | ICD-10-CM | POA: Diagnosis not present

## 2023-02-08 DIAGNOSIS — Z5181 Encounter for therapeutic drug level monitoring: Secondary | ICD-10-CM | POA: Insufficient documentation

## 2023-02-08 DIAGNOSIS — I4891 Unspecified atrial fibrillation: Secondary | ICD-10-CM | POA: Insufficient documentation

## 2023-02-08 DIAGNOSIS — Z7901 Long term (current) use of anticoagulants: Secondary | ICD-10-CM | POA: Insufficient documentation

## 2023-02-08 LAB — POCT INR: INR: 1.8 — AB (ref 2.0–3.0)

## 2023-02-08 NOTE — Patient Instructions (Signed)
 Description   Today take 1 tablet of warfarin then continue taking warfarin 1 tablet daily except for 1/2 tablet on Mondays, Wednesdays and Fridays. Recheck INR in 6 weeks.  Coumadin clinic [phone].

## 2023-03-22 ENCOUNTER — Ambulatory Visit: Payer: Medicare Other | Attending: Internal Medicine

## 2023-03-22 DIAGNOSIS — I4891 Unspecified atrial fibrillation: Secondary | ICD-10-CM | POA: Insufficient documentation

## 2023-03-22 DIAGNOSIS — Z5181 Encounter for therapeutic drug level monitoring: Secondary | ICD-10-CM | POA: Diagnosis not present

## 2023-03-22 DIAGNOSIS — Z7901 Long term (current) use of anticoagulants: Secondary | ICD-10-CM | POA: Insufficient documentation

## 2023-03-22 DIAGNOSIS — I4819 Other persistent atrial fibrillation: Secondary | ICD-10-CM | POA: Diagnosis not present

## 2023-03-22 LAB — POCT INR: INR: 1.4 — AB (ref 2.0–3.0)

## 2023-03-22 NOTE — Patient Instructions (Signed)
Today take 1.5  tablets of warfarin then continue taking warfarin 1 tablet daily except for 1/2 tablet on Mondays, Wednesdays and Fridays. Recheck INR in 4 weeks.  Coumadin clinic [phone].

## 2023-03-27 ENCOUNTER — Telehealth (INDEPENDENT_AMBULATORY_CARE_PROVIDER_SITE_OTHER): Payer: Medicare Other | Admitting: Primary Care

## 2023-03-27 ENCOUNTER — Ambulatory Visit: Payer: Self-pay | Admitting: Primary Care

## 2023-03-27 ENCOUNTER — Encounter: Payer: Self-pay | Admitting: Primary Care

## 2023-03-27 VITALS — Ht 65.0 in | Wt 260.0 lb

## 2023-03-27 DIAGNOSIS — J069 Acute upper respiratory infection, unspecified: Secondary | ICD-10-CM | POA: Diagnosis not present

## 2023-03-27 NOTE — Progress Notes (Signed)
Patient ID: [name], female    DOB: July 28, 1945, 78 y.o.   MRN: [id number]  Virtual visit completed through [employer], a video enabled telemedicine application. Due to national recommendations of social distancing due to COVID-19, a virtual visit is felt to be most appropriate for this patient at this time. Reviewed limitations, risks, security and privacy concerns of performing a virtual visit and the availability of in person appointments. I also reviewed that there may be a patient responsible charge related to this service. The patient agreed to proceed.   Patient location: home Provider location: [place] at [facility], office Persons participating in this virtual visit: patient, provider   If any vitals were documented, they were collected by patient at home unless specified below.    Ht 5\' 5"  (1.651 m) Comment: Per chart  Wt 260 lb (117.9 kg) Comment: Per chart  BMI 43.27 kg/m    CC: URI Symptoms  HPI: [name] is a 78 y.o. female with a history of hypertension, atrial fibrillation, CAD with NSTEMI, LVH, OSA, CKD, dyspnea presenting on 03/27/2023 for Cough (Clear productive cough, sinus pressure, right ear ache, headache above right eye /Cough started Sunday night/Has not done any at home test)  Symptom onset three evenings ago with a dry cough. She then developed rhinorrhea, right frontal headache, right ear pain.   She's been taking Tylenol, Claritin, Mucinex, and Benadryl with temporary improvement. She has not tested for influenza or Covid-19 infection. Today she's feeling slightly better.   She denies fevers, shortness of breath, chest tightness.        Relevant past medical, surgical, family and social history reviewed and updated as indicated. Interim medical history since our last visit reviewed. Allergies and medications reviewed and updated. Outpatient Medications Prior to Visit  Medication Sig Dispense Refill   acetaminophen (TYLENOL) 500 MG tablet Take  500-1,000 mg by mouth every 6 (six) hours as needed for headache.      diltiazem (CARDIZEM CD) 240 MG 24 hr capsule TAKE 1 CAPSULE BY MOUTH EVERY DAY 90 capsule 0   diphenhydrAMINE HCl (BENADRYL ALLERGY PO) Take 1 each by mouth at bedtime as needed.     furosemide (LASIX) 40 MG tablet TAKE 1 TABLET BY MOUTH TWICE A DAY 180 tablet 3   loratadine (CLARITIN) 10 MG tablet Take 10 mg by mouth daily.     magnesium oxide (MAG-OX) 400 MG tablet TAKE 1 TABLET BY MOUTH EVERY DAY 90 tablet 2   warfarin (COUMADIN) 2.5 MG tablet TAKE 1/2 TO 1 TABLET DAILY AS DIRECTED BY COUMADIN CLINIC 90 tablet 1   triamcinolone ointment (KENALOG) 0.5 % Apply 1 Application topically 2 (two) times daily. (Patient not taking: Reported on 03/27/2023) 30 g 0   No facility-administered medications prior to visit.     Per HPI unless specifically indicated in ROS section below Review of Systems  Constitutional:  Positive for fatigue. Negative for chills and fever.  HENT:  Positive for ear pain and sinus pressure.   Respiratory:  Positive for cough. Negative for chest tightness and shortness of breath.   Neurological:  Positive for headaches.   Objective:  Ht 5\' 5"  (1.651 m) Comment: Per chart  Wt 260 lb (117.9 kg) Comment: Per chart  BMI 43.27 kg/m   Wt Readings from Last 3 Encounters:  03/27/23 260 lb (117.9 kg)  01/09/23 260 lb 6.4 oz (118.1 kg)  12/21/22 261 lb 6.4 oz (118.6 kg)       Physical  exam: General: Alert and oriented x 3, no distress, does not appear sickly  Pulmonary: Speaks in complete sentences without increased work of breathing, no cough during visit.  Psychiatric: Normal mood, thought content, and behavior.     Results for orders placed or performed in visit on 03/22/23  POCT INR   Collection Time: 03/22/23 11:10 AM  Result Value Ref Range   INR 1.4 (A) 2.0 - 3.0   POC INR     Assessment & Plan:   Problem List Items Addressed This Visit       Respiratory   Viral URI with cough -  Primary   Symptoms and presentation representative of viral etiology at this point. Schedule exam today reassuring, although limited.  Continue conservative treatment with Mucinex, Claritin, Tylenol as needed. We discussed to update early next week if symptoms have not improved, or progressed.          No orders of the defined types were placed in this encounter.  No orders of the defined types were placed in this encounter.   I discussed the assessment and treatment plan with the patient. The patient was provided an opportunity to ask questions and all were answered. The patient agreed with the plan and demonstrated an understanding of the instructions. The patient was advised to call back or seek an in-person evaluation if the symptoms worsen or if the condition fails to improve as anticipated.  Follow up plan:  Continue Mucinex, Claritin, and Tylenol as needed. Please call me Monday next week if your symptoms have not improved or become worse.   It was a pleasure to see you today!   Doreene Nest, NP

## 2023-03-27 NOTE — Assessment & Plan Note (Signed)
Symptoms and presentation representative of viral etiology at this point. Schedule exam today reassuring, although limited.  Continue conservative treatment with Mucinex, Claritin, Tylenol as needed. We discussed to update early next week if symptoms have not improved, or progressed.

## 2023-03-27 NOTE — Telephone Encounter (Signed)
Evaluated.  

## 2023-03-27 NOTE — Patient Instructions (Signed)
Continue Mucinex, Claritin, and Tylenol as needed. Please call me Monday next week if your symptoms have not improved or become worse.   It was a pleasure to see you today!

## 2023-03-27 NOTE — Telephone Encounter (Signed)
Copied from CRM [id number]. Topic: Clinical - Red Word Triage >> Mar 27, 2023  8:12 AM Irine Seal wrote: [facility] that prompted transfer to Nurse Triage: patient has history or cardiac issues, she presents today with persistent productive cough, headache, sinus pressure and drainage as well as ear ache. Patient is not sure if she has had a fever. Wanting acute visit  Chief Complaint: cough Symptoms: productive cough at times, Coughing spells Frequency: Sunday Pertinent Negatives: Patient denies fever Disposition: [] ED /[] Urgent Care (no appt availability in office) / [x] Appointment(In office/virtual)/ []  [facility] [facility]/ [] Home Care/ [] Refused Recommended Disposition /[] [facility]/ []  Follow-up with PCP Additional Notes: muccinex, claritin, benedryal. Coumdain 1.4 on Friday. headache-over right eye, sinus pressure right ear ache.SOB- no more than usually .   Reason for Disposition  [1] Sinus pain (not just congestion) AND [2] fever  SEVERE coughing spells (e.g., whooping sound after coughing, vomiting after coughing)  Answer Assessment - Initial Assessment Questions 1. ONSET: "When did the cough begin?"      Sunday 2. SEVERITY: "How bad is the cough today?"      Severe- coughing spells 3. SPUTUM: "Describe the color of your sputum" (none, dry cough; clear, white, yellow, green)     Clear  4. HEMOPTYSIS: "Are you coughing up any blood?" If so ask: "How much?" (flecks, streaks, tablespoons, etc.)     N/a 5. DIFFICULTY BREATHING: "Are you having difficulty breathing?" If Yes, ask: "How bad is it?" (e.g., mild, moderate, severe)    - MILD: No SOB at rest, mild SOB with walking, speaks normally in sentences, can lie down, no retractions, pulse < 100.    - MODERATE: SOB at rest, SOB with minimal exertion and prefers to sit, cannot lie down flat, speaks in phrases, mild retractions, audible wheezing, pulse 100-120.    - SEVERE: Very SOB at rest, speaks in single words, struggling  to breathe, sitting hunched forward, retractions, pulse > 120      SOB- no more than usually 6. FEVER: "Do you have a fever?" If Yes, ask: "What is your temperature, how was it measured, and when did it start?"     no 7. CARDIAC HISTORY: "Do you have any history of heart disease?" (e.g., heart attack, congestive heart failure)      CHF, A.Fib, cardiomyopathy 8. LUNG HISTORY: "Do you have any history of lung disease?"  (e.g., pulmonary embolus, asthma, emphysema)     no 9. PE RISK FACTORS: "Do you have a history of blood clots?" (or: recent major surgery, recent prolonged travel, bedridden)     N/a 10. OTHER SYMPTOMS: "Do you have any other symptoms?" (e.g., runny nose, wheezing, chest pain)       Headache-over right eye, right ear ache, sinus pressure, runny nose 11. PREGNANCY: "Is there any chance you are pregnant?" "When was your last menstrual period?"       N/a 12. TRAVEL: "Have you traveled out of the country in the last month?" (e.g., travel history, exposures)       N/a  Answer Assessment - Initial Assessment Questions 1. LOCATION: "Where does it hurt?"      headache-over right eye, sinus pressure right ear ache. 2. ONSET: "When did the sinus pain start?"  (e.g., hours, days)      Sunday 3. SEVERITY: "How bad is the pain?"   (Scale 1-10; mild, moderate or severe)   - MILD (1-3): doesn't interfere with normal activities    - MODERATE (4-7): interferes  with normal activities (e.g., work or school) or awakens from sleep   - SEVERE (8-10): excruciating pain and patient unable to do any normal activities        moderate 4. RECURRENT SYMPTOM: "Have you ever had sinus problems before?" If Yes, ask: "When was the last time?" and "What happened that time?"      yes 5. NASAL CONGESTION: "Is the nose blocked?" If Yes, ask: "Can you open it or must you breathe through your mouth?"     no 6. NASAL DISCHARGE: "Do you have discharge from your nose?" If so ask, "What color?"     Runny nose 7.  FEVER: "Do you have a fever?" If Yes, ask: "What is it, how was it measured, and when did it start?"      no 8. OTHER SYMPTOMS: "Do you have any other symptoms?" (e.g., sore throat, cough, earache, difficulty breathing)     Right ear ache, SOB-but normal for pt 9. PREGNANCY: "Is there any chance you are pregnant?" "When was your last menstrual period?"     N/a  Protocols used: Cough - Acute Non-Productive-A-AH, Sinus Pain or Congestion-A-AH

## 2023-03-27 NOTE — Telephone Encounter (Signed)
Pt already scheduled with Allayne Gitelman NP 03/27/23 at 8:40 for virtual visit. Sending FYI to Allayne Gitelman NP.

## 2023-04-19 ENCOUNTER — Ambulatory Visit: Payer: Medicare Other | Attending: Cardiology

## 2023-04-19 DIAGNOSIS — Z5181 Encounter for therapeutic drug level monitoring: Secondary | ICD-10-CM | POA: Insufficient documentation

## 2023-04-19 DIAGNOSIS — I4891 Unspecified atrial fibrillation: Secondary | ICD-10-CM | POA: Diagnosis not present

## 2023-04-19 DIAGNOSIS — Z7901 Long term (current) use of anticoagulants: Secondary | ICD-10-CM | POA: Diagnosis not present

## 2023-04-19 DIAGNOSIS — I4819 Other persistent atrial fibrillation: Secondary | ICD-10-CM | POA: Insufficient documentation

## 2023-04-19 LAB — POCT INR: INR: 1.8 — AB (ref 2.0–3.0)

## 2023-04-19 NOTE — Patient Instructions (Signed)
 Today take 1  tablet of warfarin then Increase to 1 tablet daily except for 1/2 tablet on Mondays and Fridays. Recheck INR in 3 weeks.  Coumadin clinic [phone].

## 2023-05-08 ENCOUNTER — Ambulatory Visit: Attending: Cardiology

## 2023-05-08 DIAGNOSIS — Z7901 Long term (current) use of anticoagulants: Secondary | ICD-10-CM | POA: Diagnosis not present

## 2023-05-08 DIAGNOSIS — Z5181 Encounter for therapeutic drug level monitoring: Secondary | ICD-10-CM | POA: Diagnosis not present

## 2023-05-08 DIAGNOSIS — I4819 Other persistent atrial fibrillation: Secondary | ICD-10-CM | POA: Diagnosis not present

## 2023-05-08 DIAGNOSIS — I4891 Unspecified atrial fibrillation: Secondary | ICD-10-CM | POA: Diagnosis not present

## 2023-05-08 LAB — POCT INR: INR: 1.9 — AB (ref 2.0–3.0)

## 2023-05-08 NOTE — Patient Instructions (Signed)
 Take 1.5 tablets today only then  Increase to 1 tablet daily. Recheck INR in 3 weeks.  Coumadin clinic [phone].

## 2023-05-20 ENCOUNTER — Encounter: Payer: Self-pay | Admitting: General Practice

## 2023-05-20 ENCOUNTER — Ambulatory Visit (INDEPENDENT_AMBULATORY_CARE_PROVIDER_SITE_OTHER): Admitting: General Practice

## 2023-05-20 VITALS — BP 132/84 | HR 100 | Temp 97.7°F | Ht 65.0 in | Wt 260.0 lb

## 2023-05-20 DIAGNOSIS — H9201 Otalgia, right ear: Secondary | ICD-10-CM | POA: Insufficient documentation

## 2023-05-20 DIAGNOSIS — H6123 Impacted cerumen, bilateral: Secondary | ICD-10-CM

## 2023-05-20 HISTORY — DX: Impacted cerumen, bilateral: H61.23

## 2023-05-20 MED ORDER — CYCLOBENZAPRINE HCL 5 MG PO TABS: 2.5000 mg | ORAL_TABLET | Freq: Every day | ORAL | 0 refills | Status: AC

## 2023-05-20 NOTE — Assessment & Plan Note (Signed)
 Exam stable. No sign of otitis media on exam.   Symptoms suggestive of TMJ.  Discussed treatment options.  Agreeable to take muscle relaxer.  Start Flexeril 2.5 mg at bedtime. Discussed side effects and handout provided.   Er precautions provided.  Schedule f/u with pcp if symptoms worsen or do not improve.

## 2023-05-20 NOTE — Assessment & Plan Note (Signed)
 Bilateral cerumen impaction identified on exam. Patient consented to irrigation of canals bilaterally.  Bilateral canals irrigated. Patient tolerated well. TM's and canals post irrigation unremarkable.   Discussed home care instructions.

## 2023-05-20 NOTE — Progress Notes (Addendum)
 Established Patient Office Visit  Subjective   Patient ID: [name], female    DOB: [birth year]  Age: 78 y.o. MRN: [id number]  Chief Complaint  Patient presents with   Ear Pain    In right ear since this weekend. Thursday noticed it was stopped up. Patient states pain is starting to go down her face some. Ear has also been itchy for about 2 weeks.     HPI  [name] is a 78 year old female, patient of Vernona Rieger, NP, with past medical history of HTN, Afib, NSTEMI, OSA, seasonal allergies, presents today for an acute visit.   Right Ear pain: symptom onset was two weeks ago with itching in both ears. On last Thursday, her right ear felt like it was stopped up. Then on Saturday, she developed some ear pain in he right ear. She is also having some jaw pain on the right side as well. She takes a daily Claritin due to allergies.  No hearing loss, discharge, sinus pain or congestion. She denies fevers, chills, nausea, vomiting or diarrhea. She has had dry cough and sneezing which she attributes to seasonal allergies. No dizziness or balance concerns.    Patient Active Problem List   Diagnosis Date Noted   Bilateral impacted cerumen 05/20/2023   Ear pain, right 05/20/2023   Other fatigue 12/18/2021   Elevated TSH 04/21/2021   Hypercalcemia 04/21/2021   CKD (chronic kidney disease), stage II 08/12/2019   LVH (left ventricular hypertrophy) 07/26/2019   Educated about COVID-19 virus infection 06/17/2019   Chronic atrial fibrillation (HCC) 06/17/2019   Viral URI with cough 01/06/2019   Subarachnoid hemorrhage (HCC) 12/09/2018   Pruritus 11/16/2015   Estrogen deficiency 11/02/2015   Morbid obesity, unspecified obesity type (HCC) 01/08/2015   Dyspnea 01/07/2015   Apical variant hypertrophic cardiomyopathy (HCC) 09/24/2013   NSTEMI (non-ST elevated myocardial infarction) (HCC) 09/22/2013   Rash and nonspecific skin eruption 06/17/2012   Chronic anticoagulation 02/07/2011    Coronary atherosclerosis 12/01/2008   Obesity, morbid (HCC) 08/06/2006   Essential hypertension 08/06/2006   Osteoarthritis 08/06/2006   Obstructive sleep apnea syndrome 08/06/2006   URINARY INCONTINENCE, MIXED 08/06/2006   HYPERCHOLESTEROLEMIA, PURE 08/01/2006   Persistent atrial fibrillation (HCC) 07/10/2006   Past Medical History:  Diagnosis Date   Arthritis    CAD (coronary artery disease)    a. 07/2008 NSTEMI ->Cath: LM nl, LAD 70p/90p, 7m, LCX nl, RCA nl, EF 60%.  The LAD was stented with a 2.25x50mm Veri-Flex BMS.  b. 8/15 cath patent stent, nonobstructive disease otherwise - unchanged. Elevated troponin felt due to demand ischemia in setting of AF-RVR. c. Elevated trop 06/2014 felt due to demand ischemia.   Chronic diastolic CHF (congestive heart failure) (HCC)    a. 07/2008 Echo: EF 60-65%, Triv AI/MR, Mild TR;  b. 09/2013 Echo: EF 60-65%, mild conc LVH.   Difficult intubation    Duodenitis    GERD (gastroesophageal reflux disease)    Headache(784.0)    History of positive PPD    Hyperlipidemia    Hypertension    Lactose intolerance    Morbid obesity (HCC)    Osteoporosis    PAF (paroxysmal atrial fibrillation) (HCC)    a. s/p multiple cardioversions;  b. 2010: on Tikosyn - dose decreased from to in setting of NSTEMI/QT prolongation. Did not hold NSR. c. admitted 08/2011 with loading at BID with DCCV at that time;  d. Chronic coumadin (CHA2DS2VASc = 5). e. 06/2014: Joice Lofts stopped  due to ineffective; started on amiodarone.   Renal insufficiency    a. Baseline Cr appears 1.1-1.2.   Rosacea    Past Surgical History:  Procedure Laterality Date   ABDOMINAL HYSTERECTOMY     partial, bleeding   CHOLECYSTECTOMY     CORONARY ANGIOPLASTY WITH STENT PLACEMENT  2010   2.75- x 20-mm VeriFlex DES to the pLAD   CORONARY STENT PLACEMENT     ESOPHAGOGASTRODUODENOSCOPY  2003   KNEE ARTHROSCOPY  2000 & 2001   left and right   LEFT HEART CATHETERIZATION WITH CORONARY  ANGIOGRAM N/A 09/24/2013   Procedure: LEFT HEART CATHETERIZATION WITH CORONARY ANGIOGRAM;  Surgeon: Arlander Bellman, MD;  Susie Erichsen OK, LAD stent patent, mLAD 50-60%, CFX OK, RCA 30-40%, EF 70%   TOTAL KNEE ARTHROPLASTY     bilateral   TUBAL LIGATION     Allergies  Allergen Reactions   Other Other (See Comments)    Most antibiotics cause yeast infections- Keflex IS tolerated   Statins Other (See Comments)    Muscle and joint pain         05/20/2023    9:33 AM 03/27/2023    8:37 AM 11/23/2022   11:53 AM  Depression screen PHQ 2/9  Decreased Interest 1 0 1  Down, Depressed, Hopeless 1 0 1  PHQ - 2 Score 2 0 2  Altered sleeping 1  2  Tired, decreased energy 3  3  Change in appetite 0  2  Feeling bad or failure about yourself  0  1  Trouble concentrating 0  0  Moving slowly or fidgety/restless 3  0  Suicidal thoughts 0  0  PHQ-9 Score 9  10  Difficult doing work/chores Not difficult at all  Somewhat difficult       05/20/2023    9:34 AM 11/23/2022   11:54 AM  GAD 7 : Generalized Anxiety Score  Nervous, Anxious, on Edge 0 1  Control/stop worrying 1 1  Worry too much - different things 1 1  Trouble relaxing 0 1  Restless 0 0  Easily annoyed or irritable 0 0  Afraid - awful might happen 0 0  Total GAD 7 Score 2 4  Anxiety Difficulty Not difficult at all Not difficult at all      Review of Systems  Constitutional:  Negative for chills and fever.  HENT:  Positive for ear pain. Negative for ear discharge, hearing loss, sinus pain and sore throat.   Respiratory:  Negative for shortness of breath.   Cardiovascular:  Negative for chest pain.  Gastrointestinal:  Negative for abdominal pain, constipation, diarrhea, heartburn, nausea and vomiting.  Genitourinary:  Negative for dysuria, frequency and urgency.  Neurological:  Negative for dizziness and headaches.  Endo/Heme/Allergies:  Negative for polydipsia.  Psychiatric/Behavioral:  Negative for depression and suicidal ideas.  The patient is not nervous/anxious.       Objective:     BP 132/84 (BP Location: Left Arm, Patient Position: Sitting, Cuff Size: Large)   Pulse 100   Temp 97.7 F (36.5 C) (Oral)   Ht 5\' 5"  (1.651 m)   Wt 260 lb (117.9 kg)   SpO2 98%   BMI 43.27 kg/m  BP Readings from Last 3 Encounters:  05/20/23 132/84  01/09/23 (!) 152/74  12/21/22 126/78   Wt Readings from Last 3 Encounters:  05/20/23 260 lb (117.9 kg)  03/27/23 260 lb (117.9 kg)  01/09/23 260 lb 6.4 oz (118.1 kg)      Physical Exam  Vitals and nursing note reviewed.  Constitutional:      Appearance: Normal appearance.  HENT:     Right Ear: Tympanic membrane and ear canal normal. There is impacted cerumen.     Left Ear: Tympanic membrane and ear canal normal. There is impacted cerumen.     Nose: Nose normal.     Mouth/Throat:     Mouth: Mucous membranes are moist.  Eyes:     Conjunctiva/sclera: Conjunctivae normal.  Cardiovascular:     Rate and Rhythm: Normal rate and regular rhythm.     Pulses: Normal pulses.     Heart sounds: Normal heart sounds.  Pulmonary:     Effort: Pulmonary effort is normal.     Breath sounds: Normal breath sounds.  Neurological:     Mental Status: She is alert and oriented to person, place, and time.  Psychiatric:        Mood and Affect: Mood normal.        Behavior: Behavior normal.        Thought Content: Thought content normal.        Judgment: Judgment normal.      No results found for any visits on 05/20/23.     The ASCVD Risk score (Arnett DK, et al., 2019) failed to calculate for the following reasons:   Risk score cannot be calculated because patient has a medical history suggesting prior/existing ASCVD    Assessment & Plan:  Bilateral impacted cerumen Assessment & Plan: Bilateral cerumen impaction identified on exam. Patient consented to irrigation of canals bilaterally.  Bilateral canals irrigated. Patient tolerated well. TM's and canals post irrigation  unremarkable.   Discussed home care instructions.     Ear pain, right Assessment & Plan: Exam stable. No sign of otitis media on exam.   Symptoms suggestive of TMJ.  Discussed treatment options.  Agreeable to take muscle relaxer.  Start Flexeril 2.5 mg at bedtime. Discussed side effects and handout provided.   Er precautions provided.  Schedule f/u with pcp if symptoms worsen or do not improve.   Orders: -     Cyclobenzaprine HCl; Take 0.5 tablets (2.5 mg total) by mouth at bedtime for 14 days.  Dispense: 7 tablet; Refill: 0    Return if symptoms worsen or fail to improve.    Jolanda Nation, NP

## 2023-05-20 NOTE — Patient Instructions (Signed)
 Start cyclobenzaprine 2.5 mg (1/2 tablet) at bedtime.   It can be make you drowsy.   Follow up with Polly Brink if symptoms worsen or do not improve.   It was a pleasure meeting you!

## 2023-05-29 ENCOUNTER — Ambulatory Visit: Attending: Cardiovascular Disease

## 2023-05-29 DIAGNOSIS — I4819 Other persistent atrial fibrillation: Secondary | ICD-10-CM | POA: Diagnosis not present

## 2023-05-29 DIAGNOSIS — Z7901 Long term (current) use of anticoagulants: Secondary | ICD-10-CM

## 2023-05-29 LAB — POCT INR: INR: 2.5 (ref 2.0–3.0)

## 2023-05-29 NOTE — Patient Instructions (Signed)
 Description   Continue on same dosage of Warfarin 1 tablet daily. Recheck INR in 4 weeks.  Pt sees Dr Lavonne Prairie on 07/11/23 (6 weeks), pt requests to coordinate appt. Coumadin  clinic [phone].

## 2023-06-09 ENCOUNTER — Other Ambulatory Visit: Payer: Self-pay | Admitting: Cardiology

## 2023-07-04 ENCOUNTER — Ambulatory Visit: Payer: Self-pay | Admitting: Internal Medicine

## 2023-07-04 ENCOUNTER — Encounter: Payer: Self-pay | Admitting: Internal Medicine

## 2023-07-04 ENCOUNTER — Ambulatory Visit (INDEPENDENT_AMBULATORY_CARE_PROVIDER_SITE_OTHER)
Admission: RE | Admit: 2023-07-04 | Discharge: 2023-07-04 | Disposition: A | Discharge status: Home / Self Care | Source: Ambulatory Visit | Attending: Internal Medicine | Admitting: Internal Medicine

## 2023-07-04 ENCOUNTER — Ambulatory Visit: Admitting: Internal Medicine

## 2023-07-04 VITALS — BP 138/88 | HR 128 | Temp 100.3°F | Ht 65.0 in | Wt 255.0 lb

## 2023-07-04 DIAGNOSIS — R051 Acute cough: Secondary | ICD-10-CM

## 2023-07-04 DIAGNOSIS — J988 Other specified respiratory disorders: Secondary | ICD-10-CM | POA: Diagnosis not present

## 2023-07-04 DIAGNOSIS — R918 Other nonspecific abnormal finding of lung field: Secondary | ICD-10-CM | POA: Diagnosis not present

## 2023-07-04 DIAGNOSIS — R0602 Shortness of breath: Secondary | ICD-10-CM | POA: Diagnosis not present

## 2023-07-04 DIAGNOSIS — R509 Fever, unspecified: Secondary | ICD-10-CM | POA: Diagnosis not present

## 2023-07-04 DIAGNOSIS — I517 Cardiomegaly: Secondary | ICD-10-CM | POA: Diagnosis not present

## 2023-07-04 LAB — POCT INFLUENZA A/B
Influenza A, POC: NEGATIVE
Influenza B, POC: NEGATIVE

## 2023-07-04 LAB — POC COVID19 BINAXNOW: SARS Coronavirus 2 Ag: NEGATIVE

## 2023-07-04 MED ORDER — AMOXICILLIN-POT CLAVULANATE 875-125 MG PO TABS: 1.0000 | ORAL_TABLET | Freq: Two times a day (BID) | ORAL | 0 refills | Status: DC

## 2023-07-04 MED ORDER — FLUCONAZOLE 150 MG PO TABS: 150.0000 mg | ORAL_TABLET | ORAL | 1 refills | Status: DC

## 2023-07-04 NOTE — Assessment & Plan Note (Addendum)
 Likely just head and sinus--but fever/dyspnea, so will check CXR COVID/flu tests negative CXR shows increased interstitial markings but no change since 2023 Will treat with augmentin  875 bid x 7 days Fluconazole  for possible yeast

## 2023-07-04 NOTE — Progress Notes (Signed)
 Subjective:    Patient ID: [name], female    DOB: 06/02/1945, 78 y.o.   MRN: [id number]  HPI Here with respiratory illness  Feels SOB and lots of cough Started 2 days ago Fever to 100.8 today. Some chills but no sweating or shakes No sore throat  Slight ear pain is chronic Frontal headache  Tried mucinex and claritin --no clear help  Current Outpatient Medications on File Prior to Visit  Medication Sig Dispense Refill   acetaminophen  (TYLENOL ) 500 MG tablet Take 500-1,000 mg by mouth every 6 (six) hours as needed for headache.      diltiazem  (CARDIZEM  CD) 240 MG 24 hr capsule TAKE 1 CAPSULE BY MOUTH EVERY DAY 90 capsule 1   diphenhydrAMINE HCl (BENADRYL ALLERGY PO) Take 1 each by mouth at bedtime as needed.     furosemide  (LASIX ) 40 MG tablet TAKE 1 TABLET BY MOUTH TWICE A DAY 180 tablet 3   loratadine  (CLARITIN ) 10 MG tablet Take 10 mg by mouth daily.     magnesium  oxide (MAG-OX) 400 MG tablet TAKE 1 TABLET BY MOUTH EVERY DAY 90 tablet 2   triamcinolone  ointment (KENALOG ) 0.5 % Apply 1 Application topically 2 (two) times daily. 30 g 0   warfarin (COUMADIN ) 2.5 MG tablet TAKE 1/2 TO 1 TABLET DAILY AS DIRECTED BY COUMADIN  CLINIC (Patient taking differently: TAKE 1 TABLET DAILY AS DIRECTED BY COUMADIN  CLINIC) 90 tablet 1   No current facility-administered medications on file prior to visit.    Allergies  Allergen Reactions   Other Other (See Comments)    Most antibiotics cause yeast infections- Keflex  IS tolerated   Statins Other (See Comments)    Muscle and joint pain    Past Medical History:  Diagnosis Date   Arthritis    CAD (coronary artery disease)    a. 07/2008 NSTEMI ->Cath: LM nl, LAD 70p/90p, 75m, LCX nl, RCA nl, EF 60%.  The LAD was stented with a 2.25x50mm Veri-Flex BMS.  b. 8/15 cath patent stent, nonobstructive disease otherwise - unchanged. Elevated troponin felt due to demand ischemia in setting of AF-RVR. c. Elevated trop 06/2014 felt due to demand  ischemia.   Chronic diastolic CHF (congestive heart failure) (HCC)    a. 07/2008 Echo: EF 60-65%, Triv AI/MR, Mild TR;  b. 09/2013 Echo: EF 60-65%, mild conc LVH.   Difficult intubation    Duodenitis    GERD (gastroesophageal reflux disease)    Headache(784.0)    History of positive PPD    Hyperlipidemia    Hypertension    Lactose intolerance    Morbid obesity (HCC)    Osteoporosis    PAF (paroxysmal atrial fibrillation) (HCC)    a. s/p multiple cardioversions;  b. 2010: on Tikosyn  - dose decreased from 500mcg to 250mcg in setting of NSTEMI/QT prolongation. Did not hold NSR. c. admitted 08/2011 with loading at 500mcg BID with DCCV at that time;  d. Chronic coumadin  (CHA2DS2VASc = 5). e. 06/2014: tikosyn  stopped due to ineffective; started on amiodarone .   Renal insufficiency    a. Baseline Cr appears 1.1-1.2.   Rosacea     Past Surgical History:  Procedure Laterality Date   ABDOMINAL HYSTERECTOMY     partial, bleeding   CHOLECYSTECTOMY     CORONARY ANGIOPLASTY WITH STENT PLACEMENT  2010   2.75- x 20-mm VeriFlex DES to the pLAD   CORONARY STENT PLACEMENT     ESOPHAGOGASTRODUODENOSCOPY  2003   KNEE ARTHROSCOPY  2000 & 2001   left  and right   LEFT HEART CATHETERIZATION WITH CORONARY ANGIOGRAM N/A 09/24/2013   Procedure: LEFT HEART CATHETERIZATION WITH CORONARY ANGIOGRAM;  Surgeon: Arlander Bellman, MD;  Susie Erichsen OK, LAD stent patent, mLAD 50-60%, CFX OK, RCA 30-40%, EF 70%   TOTAL KNEE ARTHROPLASTY     bilateral   TUBAL LIGATION      Family History  Problem Relation Age of Onset   Other Mother        GI Bleed   Pneumonia Father    Coronary artery disease Brother    Breast cancer Paternal Grandmother    Rectal cancer Paternal Grandfather     Social History   Socioeconomic History   Marital status: Widowed    Spouse name: Not on file   Number of children: 2   Years of education: Not on file   Highest education level: Not on file  Occupational History   Occupation: retired     Associate Professor: RETIRED  Tobacco Use   Smoking status: Never   Smokeless tobacco: Never  Vaping Use   Vaping status: Never Used  Substance and Sexual Activity   Alcohol use: No    Alcohol/week: 0.0 standard drinks of alcohol   Drug use: No   Sexual activity: Not Currently    Birth control/protection: Post-menopausal  Other Topics Concern   Not on file  Social History Narrative   Lives in [place], [place] w/ husband.   Social Drivers of Corporate investment banker Strain: Low Risk  (09/30/2022)   Overall Financial Resource Strain (CARDIA)    Difficulty of Paying Living Expenses: Not very hard  Food Insecurity: No Food Insecurity (09/30/2022)   Hunger Vital Sign    Worried About Running Out of Food in the Last Year: Never true    Ran Out of Food in the Last Year: Never true  Transportation Needs: No Transportation Needs (09/30/2022)   PRAPARE - Administrator, Civil Service (Medical): No    Lack of Transportation (Non-Medical): No  Physical Activity: Inactive (09/30/2022)   Exercise Vital Sign    Days of Exercise per Week: 0 days    Minutes of Exercise per Session: 0 min  Stress: No Stress Concern Present (09/30/2022)   [employer] of Occupational Health - Occupational Stress Questionnaire    Feeling of Stress : Only a little  Social Connections: Unknown (09/30/2022)   Social Connection and Isolation Panel [NHANES]    Frequency of Communication with Friends and Family: More than three times a week    Frequency of Social Gatherings with Friends and Family: Three times a week    Attends Religious Services: Patient declined    Active Member of Clubs or Organizations: No    Attends Banker Meetings: Never    Marital Status: Widowed  Intimate Partner Violence: Not At Risk (10/01/2022)   Humiliation, Afraid, Rape, and Kick questionnaire    Fear of Current or Ex-Partner: No    Emotionally Abused: No    Physically Abused: No    Sexually Abused: No   Review of  Systems No loss of smell or taste No N/V Little appetite---is able to eat    Objective:   Physical Exam Constitutional:      Appearance: Normal appearance.     Comments: Hard breathing just getting on the table (she states this is chronic)  HENT:     Head:     Comments: Right frontal tenderness    Right Ear: Tympanic membrane and ear canal  normal.     Left Ear: Tympanic membrane and ear canal normal.     Mouth/Throat:     Pharynx: No oropharyngeal exudate or posterior oropharyngeal erythema.  Pulmonary:     Effort: Pulmonary effort is normal.     Breath sounds: Normal breath sounds. No wheezing or rales.  Musculoskeletal:     Cervical back: Neck supple.  Lymphadenopathy:     Cervical: No cervical adenopathy.  Neurological:     Mental Status: She is alert.            Assessment & Plan:

## 2023-07-09 ENCOUNTER — Other Ambulatory Visit: Payer: Self-pay | Admitting: Cardiology

## 2023-07-09 NOTE — Progress Notes (Unsigned)
 Cardiology Office Note:   Date:  07/11/2023  ID:  [name], DOB [birth year], MRN [id number] PCP: Gabriel John, NP  [facility] Providers Cardiologist:  Eilleen Grates, MD {  History of Present Illness:   [name] is a 78 y.o. female  who presents for ongoing assessment and management of atrial fibrillation.  She had increased dyspnea.  Lexiscan  Myoview  was negative for ischemia.   Echo demonstrated severe LVH.  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   BNP was only mildly increased.   I increased the Cardizem .  She had an MRI and had evidence of an apical hypertrophic cardiomyopathy.  She had an apical aneurysm.  She had mild LGE.  She had 17 mm maximal thickness.   She has not wanted genetic testing.  Her son is seen by Dr. Rodolfo Clan  The daughter does not want further evaluation.   She had a Zio patch and she has had no high risk findings.  I increased her Cardizem .  Of note at the last appt she was asking now fibrillation ablation.  She saw Dr. Nunzio Belch previously.  He had offered her ablation a few years ago quoting the success rate to be lower given her chronic fibrillation and she and I had a discussion about the difficulties and success rate in people with untreated sleep apnea or obesity.  However, she might get benefit from this and has failed Tikosyn  and amiodarone  in the past.  She saw EP and Dr. Arlester Ladd did not think ablation would have long term success.    Since I last saw her she has no new complaints.  She has chronic dyspnea.  She is actually been treated with antibiotics for bronchitis recently.  However, she is in relatively good place today and says her dyspnea is not worse.  She does not have any resting PND or orthopnea.  Her leg swelling is less than previous.  She is not having any new chest pain.  She is not having any new palpitations, presyncope or syncope.   ROS: As stated in the HPI and negative for all other systems.  Studies Reviewed:    EKG:      NA    Risk Assessment/Calculations:    CHA2DS2-VASc Score = 5   This indicates a 7.2% annual risk of stroke. The patient's score is based upon: CHF History: 1 HTN History: 1 Diabetes History: 0 Stroke History: 0 Vascular Disease History: 0 Age Score: 2 Gender Score: 1   Physical Exam:   VS:  BP (!) 159/90 (BP Location: Right Arm, Patient Position: Sitting, Cuff Size: Large)   Pulse 66   Ht 5\' 5"  (1.651 m)   Wt 255 lb (115.7 kg)   SpO2 94%   BMI 42.43 kg/m    Wt Readings from Last 3 Encounters:  07/11/23 255 lb (115.7 kg)  07/04/23 255 lb (115.7 kg)  05/20/23 260 lb (117.9 kg)     GEN: Well nourished, well developed in no acute distress NECK: No JVD; No carotid bruits CARDIAC: Irregular RR, 2/6 apical systolic murmur, no diastolic murmurs, rubs, gallops RESPIRATORY:  Clear to auscultation without rales, wheezing or rhonchi  ABDOMEN: Soft, non-tender, non-distended EXTREMITIES:  No edema; No deformity   ASSESSMENT AND PLAN:   Chronic dyspnea on exertion: This is unchanged from previous and multifactorial.  At this point no change in therapy.  Atrial fibrillation:   We continues rate control and anticoag.    Apical hypertrophic cardiomyopathy: We had  a long discussion about this and she has not wanted genetic testing.  Her family is aware as documented elsewhere.   CKD II:   Creatinine was 1.5.  No change in therapy.  OSA:   She has not wanted to use CPAP.  No change in therapy.  Morbid obesity: She has lost about 30 pounds since 2008 and I congratulated her on that trend and encouraged more the same.  Hypertension:   Her blood pressure is elevated here but she did not take her medicines this morning.  Well-controlled at home.  No change in therapy.      Follow up with me in 1 year  Signed, Eilleen Grates, MD

## 2023-07-11 ENCOUNTER — Ambulatory Visit

## 2023-07-11 ENCOUNTER — Ambulatory Visit: Attending: Cardiology | Admitting: Cardiology

## 2023-07-11 ENCOUNTER — Encounter: Payer: Self-pay | Admitting: Cardiology

## 2023-07-11 VITALS — BP 159/90 | HR 66 | Ht 65.0 in | Wt 255.0 lb

## 2023-07-11 DIAGNOSIS — N182 Chronic kidney disease, stage 2 (mild): Secondary | ICD-10-CM | POA: Diagnosis not present

## 2023-07-11 DIAGNOSIS — Z7901 Long term (current) use of anticoagulants: Secondary | ICD-10-CM

## 2023-07-11 DIAGNOSIS — I422 Other hypertrophic cardiomyopathy: Secondary | ICD-10-CM | POA: Insufficient documentation

## 2023-07-11 DIAGNOSIS — I4819 Other persistent atrial fibrillation: Secondary | ICD-10-CM | POA: Insufficient documentation

## 2023-07-11 DIAGNOSIS — R0609 Other forms of dyspnea: Secondary | ICD-10-CM | POA: Insufficient documentation

## 2023-07-11 DIAGNOSIS — I4811 Longstanding persistent atrial fibrillation: Secondary | ICD-10-CM | POA: Insufficient documentation

## 2023-07-11 LAB — POCT INR: INR: 3.8 — AB (ref 2.0–3.0)

## 2023-07-11 MED ORDER — DILTIAZEM HCL ER COATED BEADS 240 MG PO CP24: 240.0000 mg | ORAL_CAPSULE | Freq: Every day | ORAL | 3 refills | Status: AC

## 2023-07-11 MED ORDER — FUROSEMIDE 40 MG PO TABS: 40.0000 mg | ORAL_TABLET | Freq: Two times a day (BID) | ORAL | 3 refills | Status: AC

## 2023-07-11 NOTE — Patient Instructions (Signed)
 Description   HOLD today's dose and then continue on same dosage of Warfarin 1 tablet daily.  Recheck INR in 4 weeks.  Coumadin  clinic [phone].

## 2023-07-11 NOTE — Patient Instructions (Signed)

## 2023-08-02 ENCOUNTER — Other Ambulatory Visit: Payer: Self-pay | Admitting: Cardiology

## 2023-08-02 DIAGNOSIS — Z7901 Long term (current) use of anticoagulants: Secondary | ICD-10-CM

## 2023-08-02 DIAGNOSIS — I4891 Unspecified atrial fibrillation: Secondary | ICD-10-CM

## 2023-08-08 ENCOUNTER — Ambulatory Visit: Attending: Cardiology | Admitting: *Deleted

## 2023-08-08 DIAGNOSIS — I4819 Other persistent atrial fibrillation: Secondary | ICD-10-CM | POA: Insufficient documentation

## 2023-08-08 DIAGNOSIS — Z7901 Long term (current) use of anticoagulants: Secondary | ICD-10-CM | POA: Diagnosis not present

## 2023-08-08 DIAGNOSIS — I4891 Unspecified atrial fibrillation: Secondary | ICD-10-CM | POA: Insufficient documentation

## 2023-08-08 DIAGNOSIS — Z5181 Encounter for therapeutic drug level monitoring: Secondary | ICD-10-CM | POA: Insufficient documentation

## 2023-08-08 LAB — POCT INR: POC INR: 3.2

## 2023-08-08 MED ORDER — WARFARIN SODIUM 2.5 MG PO TABS: ORAL_TABLET | ORAL | 0 refills | Status: DC

## 2023-08-08 NOTE — Patient Instructions (Signed)
 Description   START taking warfarin 1 tablet daily except for 1/2 a tablet on Thursdays.  Recheck INR in 3 weeks.  Coumadin  clinic [phone].

## 2023-08-08 NOTE — Progress Notes (Signed)
Please see anticoagulation encounter.

## 2023-08-29 ENCOUNTER — Ambulatory Visit: Attending: Cardiology

## 2023-08-29 DIAGNOSIS — Z7901 Long term (current) use of anticoagulants: Secondary | ICD-10-CM | POA: Diagnosis not present

## 2023-08-29 DIAGNOSIS — I4819 Other persistent atrial fibrillation: Secondary | ICD-10-CM | POA: Diagnosis not present

## 2023-08-29 LAB — POCT INR: INR: 3.8 — AB (ref 2.0–3.0)

## 2023-08-29 NOTE — Patient Instructions (Signed)
 Description   HOLD today's dose and then START taking warfarin 1 tablet daily except for 1/2 a tablet on Tuesdays and Thursdays.  Recheck INR in 2 weeks.  Coumadin  clinic [phone].

## 2023-08-29 NOTE — Progress Notes (Signed)
 INR 3.8; Please see anticoagulation encounter

## 2023-09-12 ENCOUNTER — Ambulatory Visit: Attending: Cardiology | Admitting: *Deleted

## 2023-09-12 DIAGNOSIS — I4819 Other persistent atrial fibrillation: Secondary | ICD-10-CM | POA: Insufficient documentation

## 2023-09-12 DIAGNOSIS — Z5181 Encounter for therapeutic drug level monitoring: Secondary | ICD-10-CM | POA: Diagnosis not present

## 2023-09-12 DIAGNOSIS — Z7901 Long term (current) use of anticoagulants: Secondary | ICD-10-CM | POA: Diagnosis not present

## 2023-09-12 LAB — POCT INR: POC INR: 2.6

## 2023-09-12 NOTE — Progress Notes (Signed)
 INR 2.6; Please see anticoagulation encounter

## 2023-09-12 NOTE — Patient Instructions (Addendum)
 Description   Continue taking warfarin 1 tablet daily except for 1/2 a tablet on Tuesdays and Thursdays.  Recheck INR in 4 weeks.  Coumadin  clinic [phone].

## 2023-10-10 ENCOUNTER — Ambulatory Visit: Attending: Cardiology | Admitting: *Deleted

## 2023-10-10 DIAGNOSIS — Z7901 Long term (current) use of anticoagulants: Secondary | ICD-10-CM | POA: Insufficient documentation

## 2023-10-10 DIAGNOSIS — I4819 Other persistent atrial fibrillation: Secondary | ICD-10-CM | POA: Diagnosis not present

## 2023-10-10 LAB — POCT INR: POC INR: 3.3

## 2023-10-10 NOTE — Progress Notes (Addendum)
 INR 3.3; Please see anticoagulation encounter  Lab Results  Component Value Date   INR 3.3 10/10/2023   INR 2.6 09/12/2023   INR 3.8 (A) 08/29/2023   PROTIME 19.2 07/20/2008   PROTIME 20.4 07/08/2008    Description   Hold warfarin today, then continue taking warfarin 1 tablet daily except for 1/2 a tablet on Tuesdays and Thursdays.  Recheck INR in 3 weeks.  Coumadin  clinic [phone].

## 2023-10-10 NOTE — Patient Instructions (Addendum)
 Description   Hold warfarin today, then continue taking warfarin 1 tablet daily except for 1/2 a tablet on Tuesdays and Thursdays.  Recheck INR in 3 weeks.  Coumadin  clinic [phone].

## 2023-10-31 ENCOUNTER — Ambulatory Visit: Attending: Cardiology | Admitting: *Deleted

## 2023-10-31 DIAGNOSIS — I4819 Other persistent atrial fibrillation: Secondary | ICD-10-CM | POA: Diagnosis not present

## 2023-10-31 DIAGNOSIS — Z5181 Encounter for therapeutic drug level monitoring: Secondary | ICD-10-CM | POA: Insufficient documentation

## 2023-10-31 DIAGNOSIS — Z7901 Long term (current) use of anticoagulants: Secondary | ICD-10-CM | POA: Insufficient documentation

## 2023-10-31 LAB — POCT INR: POC INR: 2.6

## 2023-10-31 NOTE — Progress Notes (Signed)
 Lab Results  Component Value Date   INR 2.6 10/31/2023   INR 3.3 10/10/2023   INR 2.6 09/12/2023   PROTIME 19.2 07/20/2008   PROTIME 20.4 07/08/2008    Description   Inr 2.6, Continue taking warfarin 1 tablet daily except for 1/2 a tablet on Tuesdays and Thursdays.  Recheck INR in 5 weeks.  Coumadin  clinic [phone].

## 2023-10-31 NOTE — Patient Instructions (Addendum)
 Description   Inr 2.6, Continue taking warfarin 1 tablet daily except for 1/2 a tablet on Tuesdays and Thursdays.  Recheck INR in 5 weeks.  Coumadin  clinic [phone].

## 2023-12-05 ENCOUNTER — Ambulatory Visit: Attending: Cardiology | Admitting: *Deleted

## 2023-12-05 DIAGNOSIS — I4819 Other persistent atrial fibrillation: Secondary | ICD-10-CM | POA: Insufficient documentation

## 2023-12-05 DIAGNOSIS — Z5181 Encounter for therapeutic drug level monitoring: Secondary | ICD-10-CM | POA: Diagnosis not present

## 2023-12-05 DIAGNOSIS — Z7901 Long term (current) use of anticoagulants: Secondary | ICD-10-CM | POA: Insufficient documentation

## 2023-12-05 LAB — POCT INR: POC INR: 2.8

## 2023-12-05 NOTE — Patient Instructions (Signed)
 Description   Inr 2.8, Continue taking warfarin 1 tablet daily except for 1/2 a tablet on Tuesdays and Thursdays.  Recheck INR in 6 weeks.  Coumadin  clinic [phone].

## 2023-12-05 NOTE — Progress Notes (Signed)
 Lab Results  Component Value Date   INR 2.8 12/05/2023   INR 2.6 10/31/2023   INR 3.3 10/10/2023   PROTIME 19.2 07/20/2008   PROTIME 20.4 07/08/2008    Description   Inr 2.8, Continue taking warfarin 1 tablet daily except for 1/2 a tablet on Tuesdays and Thursdays.  Recheck INR in 6 weeks.  Coumadin  clinic [phone].

## 2023-12-19 ENCOUNTER — Ambulatory Visit: Admitting: Primary Care

## 2023-12-19 ENCOUNTER — Encounter: Payer: Self-pay | Admitting: Primary Care

## 2023-12-19 ENCOUNTER — Ambulatory Visit: Payer: Self-pay | Admitting: Primary Care

## 2023-12-19 VITALS — BP 132/66 | HR 87 | Temp 97.8°F | Ht 65.0 in | Wt 259.5 lb

## 2023-12-19 DIAGNOSIS — R7303 Prediabetes: Secondary | ICD-10-CM | POA: Diagnosis not present

## 2023-12-19 DIAGNOSIS — R21 Rash and other nonspecific skin eruption: Secondary | ICD-10-CM

## 2023-12-19 DIAGNOSIS — G4733 Obstructive sleep apnea (adult) (pediatric): Secondary | ICD-10-CM

## 2023-12-19 DIAGNOSIS — I4819 Other persistent atrial fibrillation: Secondary | ICD-10-CM

## 2023-12-19 DIAGNOSIS — E559 Vitamin D deficiency, unspecified: Secondary | ICD-10-CM

## 2023-12-19 DIAGNOSIS — L299 Pruritus, unspecified: Secondary | ICD-10-CM | POA: Diagnosis not present

## 2023-12-19 DIAGNOSIS — R252 Cramp and spasm: Secondary | ICD-10-CM | POA: Diagnosis not present

## 2023-12-19 DIAGNOSIS — E210 Primary hyperparathyroidism: Secondary | ICD-10-CM | POA: Diagnosis not present

## 2023-12-19 DIAGNOSIS — E7800 Pure hypercholesterolemia, unspecified: Secondary | ICD-10-CM

## 2023-12-19 DIAGNOSIS — Z23 Encounter for immunization: Secondary | ICD-10-CM | POA: Diagnosis not present

## 2023-12-19 DIAGNOSIS — I10 Essential (primary) hypertension: Secondary | ICD-10-CM | POA: Diagnosis not present

## 2023-12-19 LAB — COMPREHENSIVE METABOLIC PANEL WITH GFR
ALT: 14 U/L (ref 0–35)
AST: 17 U/L (ref 0–37)
Albumin: 4.2 g/dL (ref 3.5–5.2)
Alkaline Phosphatase: 91 U/L (ref 39–117)
BUN: 22 mg/dL (ref 6–23)
CO2: 29 meq/L (ref 19–32)
Calcium: 10.2 mg/dL (ref 8.4–10.5)
Chloride: 98 meq/L (ref 96–112)
Creatinine, Ser: 1.5 mg/dL — ABNORMAL HIGH (ref 0.40–1.20)
GFR: 32.71 mL/min — ABNORMAL LOW (ref >60.00)
Glucose, Bld: 98 mg/dL (ref 70–99)
Potassium: 3.9 meq/L (ref 3.5–5.1)
Sodium: 138 meq/L (ref 135–145)
Total Bilirubin: 0.5 mg/dL (ref 0.2–1.2)
Total Protein: 6.9 g/dL (ref 6.0–8.3)

## 2023-12-19 LAB — LIPID PANEL
Cholesterol: 203 mg/dL — ABNORMAL HIGH (ref 0–200)
HDL: 42.8 mg/dL (ref >39.00)
LDL Cholesterol: 119 mg/dL — ABNORMAL HIGH (ref 0–99)
NonHDL: 160.34
Total CHOL/HDL Ratio: 5
Triglycerides: 209 mg/dL — ABNORMAL HIGH (ref 0.0–149.0)
VLDL: 41.8 mg/dL — ABNORMAL HIGH (ref 0.0–40.0)

## 2023-12-19 LAB — VITAMIN D 25 HYDROXY (VIT D DEFICIENCY, FRACTURES): VITD: 18.44 ng/mL — ABNORMAL LOW (ref 30.00–100.00)

## 2023-12-19 LAB — MAGNESIUM: Magnesium: 1.8 mg/dL (ref 1.5–2.5)

## 2023-12-19 LAB — HEMOGLOBIN A1C: Hgb A1c MFr Bld: 5.5 % (ref 4.6–6.5)

## 2023-12-19 MED ORDER — TRIAMCINOLONE ACETONIDE 0.5 % EX OINT: 1.0000 | TOPICAL_OINTMENT | Freq: Two times a day (BID) | CUTANEOUS | 0 refills | Status: AC

## 2023-12-19 NOTE — Progress Notes (Signed)
 Subjective:    Patient ID: [name], female    DOB: 1945-12-17, 78 y.o.   MRN: [id number]  [name] is a very pleasant 78 y.o. female with a history of hypertension, persistent atrial fibrillation, CAD with NSTEMI, OSA, CKD, osteoarthritis, hyperlipidemia who presents today for follow-up of chronic conditions.  She declines mammogram and bone density scan.  1) CAD/Hyperlipidemia: Currently managed on furosemide  40 mg twice daily, diltiazem  240 mg daily. She is mostly taking 1 pill of furosemide  daily.   2) Atrial Fibrillation/OSA: Currently managed on warfarin per Coumadin  clinic and diltiazem  to 40 mg daily.  Following with cardiology, previously failed Tikosyn  and amiodarone .  She is not a candidate for ablation.  Last office visit with cardiology was June 2025.   She does not wear CPAP machine.   3) Chronic Myalgia: Chronic to bilateral lower extremities, mostly occurring at night when straightening her legs out at night. She sleeps in her recliner as she cannot lay flat due to dizziness. Last night she had 5 episodes of cramping. Symptoms began years ago. She's discussed with her cardiologist who didn't say anything. She is not managed on statin therapy.   She takes magnesium  oxide 400 mg HS. She is mostly sedentary during the afternoon and evening. In the mornings she has more energy, is able to get out and about.   She was diagnosed with hyperparathyroidism in 2023, has not followed up since.   Review of Systems  Constitutional:  Positive for fatigue. Negative for unexpected weight change.  HENT:  Negative for rhinorrhea.   Respiratory:  Positive for shortness of breath. Negative for cough.   Cardiovascular:  Negative for chest pain.  Gastrointestinal:  Negative for constipation and diarrhea.  Musculoskeletal:  Positive for arthralgias and myalgias.  Skin:  Negative for rash.  Neurological:  Negative for headaches.         Past Medical History:  Diagnosis Date    Arthritis    Bilateral impacted cerumen 05/20/2023   CAD (coronary artery disease)    a. 07/2008 NSTEMI ->Cath: LM nl, LAD 70p/90p, 30m, LCX nl, RCA nl, EF 60%.  The LAD was stented with a 2.25x44mm Veri-Flex BMS.  b. 8/15 cath patent stent, nonobstructive disease otherwise - unchanged. Elevated troponin felt due to demand ischemia in setting of AF-RVR. c. Elevated trop 06/2014 felt due to demand ischemia.   Chronic diastolic CHF (congestive heart failure) (HCC)    a. 07/2008 Echo: EF 60-65%, Triv AI/MR, Mild TR;  b. 09/2013 Echo: EF 60-65%, mild conc LVH.   Difficult intubation    Duodenitis    GERD (gastroesophageal reflux disease)    Headache(784.0)    History of positive PPD    Hyperlipidemia    Hypertension    Lactose intolerance    Morbid obesity (HCC)    NSTEMI (non-ST elevated myocardial infarction) (HCC) 09/22/2013   Osteoporosis    PAF (paroxysmal atrial fibrillation) (HCC)    a. s/p multiple cardioversions;  b. 2010: on Tikosyn  - dose decreased from 500mcg to 250mcg in setting of NSTEMI/QT prolongation. Did not hold NSR. c. admitted 08/2011 with loading at 500mcg BID with DCCV at that time;  d. Chronic coumadin  (CHA2DS2VASc = 5). e. 06/2014: tikosyn  stopped due to ineffective; started on amiodarone .   Renal insufficiency    a. Baseline Cr appears 1.1-1.2.   Rosacea    Subarachnoid hemorrhage (HCC) 12/09/2018    Social History   Socioeconomic History   Marital status: Widowed  Spouse name: Not on file   Number of children: 2   Years of education: Not on file   Highest education level: Not on file  Occupational History   Occupation: retired    Associate Professor: RETIRED  Tobacco Use   Smoking status: Never   Smokeless tobacco: Never  Vaping Use   Vaping status: Never Used  Substance and Sexual Activity   Alcohol use: No    Alcohol/week: 0.0 standard drinks of alcohol   Drug use: No   Sexual activity: Not Currently    Birth control/protection: Post-menopausal  Other Topics  Concern   Not on file  Social History Narrative   Lives in [place], [place] w/ husband.   Social Drivers of Corporate Investment Banker Strain: Low Risk  (09/30/2022)   Overall Financial Resource Strain (CARDIA)    Difficulty of Paying Living Expenses: Not very hard  Food Insecurity: No Food Insecurity (09/30/2022)   Hunger Vital Sign    Worried About Running Out of Food in the Last Year: Never true    Ran Out of Food in the Last Year: Never true  Transportation Needs: No Transportation Needs (09/30/2022)   PRAPARE - Administrator, Civil Service (Medical): No    Lack of Transportation (Non-Medical): No  Physical Activity: Inactive (09/30/2022)   Exercise Vital Sign    Days of Exercise per Week: 0 days    Minutes of Exercise per Session: 0 min  Stress: No Stress Concern Present (09/30/2022)   [employer] of Occupational Health - Occupational Stress Questionnaire    Feeling of Stress : Only a little  Social Connections: Unknown (09/30/2022)   Social Connection and Isolation Panel    Frequency of Communication with Friends and Family: More than three times a week    Frequency of Social Gatherings with Friends and Family: Three times a week    Attends Religious Services: Patient declined    Active Member of Clubs or Organizations: No    Attends Banker Meetings: Never    Marital Status: Widowed  Intimate Partner Violence: Not At Risk (10/01/2022)   Humiliation, Afraid, Rape, and Kick questionnaire    Fear of Current or Ex-Partner: No    Emotionally Abused: No    Physically Abused: No    Sexually Abused: No    Past Surgical History:  Procedure Laterality Date   ABDOMINAL HYSTERECTOMY     partial, bleeding   CHOLECYSTECTOMY     CORONARY ANGIOPLASTY WITH STENT PLACEMENT  2010   2.75- x 20-mm VeriFlex DES to the pLAD   CORONARY STENT PLACEMENT     ESOPHAGOGASTRODUODENOSCOPY  2003   KNEE ARTHROSCOPY  2000 & 2001   left and right   LEFT HEART  CATHETERIZATION WITH CORONARY ANGIOGRAM N/A 09/24/2013   Procedure: LEFT HEART CATHETERIZATION WITH CORONARY ANGIOGRAM;  Surgeon: Ozell JONETTA Fell, MD;  Ladoris OK, LAD stent patent, mLAD 50-60%, CFX OK, RCA 30-40%, EF 70%   TOTAL KNEE ARTHROPLASTY     bilateral   TUBAL LIGATION      Family History  Problem Relation Age of Onset   Other Mother        GI Bleed   Pneumonia Father    Coronary artery disease Brother    Breast cancer Paternal Grandmother    Rectal cancer Paternal Grandfather     Allergies  Allergen Reactions   Other Other (See Comments)    Most antibiotics cause yeast infections- Keflex  IS tolerated   Statins Other (  See Comments)    Muscle and joint pain    Current Outpatient Medications on File Prior to Visit  Medication Sig Dispense Refill   acetaminophen  (TYLENOL ) 500 MG tablet Take 500-1,000 mg by mouth every 6 (six) hours as needed for headache.      diltiazem  (CARDIZEM  CD) 240 MG 24 hr capsule Take 1 capsule (240 mg total) by mouth daily. 90 capsule 3   diphenhydrAMINE HCl (BENADRYL ALLERGY PO) Take 1 each by mouth at bedtime as needed.     furosemide  (LASIX ) 40 MG tablet Take 1 tablet (40 mg total) by mouth 2 (two) times daily. 180 tablet 3   loratadine  (CLARITIN ) 10 MG tablet Take 10 mg by mouth daily.     magnesium  oxide (MAG-OX) 400 MG tablet TAKE 1 TABLET BY MOUTH EVERY DAY 90 tablet 1   warfarin (COUMADIN ) 2.5 MG tablet TAKE 1/2 TO 1 TABLET DAILY AS DIRECTED BY COUMADIN  CLINIC 100 tablet 0   No current facility-administered medications on file prior to visit.    BP 132/66   Pulse 87   Temp 97.8 F (36.6 C) (Oral)   Ht 5' 5 (1.651 m)   Wt 259 lb 8 oz (117.7 kg)   SpO2 96%   BMI 43.18 kg/m  Objective:   Physical Exam Cardiovascular:     Rate and Rhythm: Normal rate. Rhythm irregular.  Pulmonary:     Effort: Pulmonary effort is normal.     Breath sounds: Normal breath sounds.  Abdominal:     General: Bowel sounds are normal.     Palpations:  Abdomen is soft.     Tenderness: There is no abdominal tenderness.  Musculoskeletal:     Cervical back: Neck supple.  Skin:    General: Skin is warm and dry.  Neurological:     Mental Status: She is alert and oriented to person, place, and time.  Psychiatric:        Mood and Affect: Mood normal.     Physical Exam        Assessment & Plan:  Leg cramping Assessment & Plan: Unclear etiology.  Reviewed side effects from diltiazem  which do report a less than 2% chance of cramping. Could also be side effects from actions of furosemide . Could also be secondary to her sedentary lifestyle.  Checking labs today.     Need for influenza vaccination -     Flu vaccine HIGH DOSE PF(Fluzone Trivalent)  Persistent atrial fibrillation [facility]) Assessment & Plan: Noted on exam today.  Following with cardiology, office notes reviewed from June 2025. Continue diltiazem  to 240 mg daily and warfarin per Coumadin  clinic.   Essential hypertension Assessment & Plan: Controlled.  Continue diltiazem  240 mg daily.   Obstructive sleep apnea syndrome Assessment & Plan: Noncompliant to CPAP machine. She understands importance of use.   Rash and nonspecific skin eruption Assessment & Plan: Stable.  Refill provided for triamcinolone  0.5% ointment for which she uses sparingly.    Pruritus Assessment & Plan: Waxes and wanes  Continue triamcinolone  0.5% ointment twice daily. Refill provided   HYPERCHOLESTEROLEMIA, PURE Assessment & Plan: Repeat lipid panel pending Statin intolerant  Orders: -     Lipid panel -     Comprehensive metabolic panel with GFR  Primary hyperparathyroidism Assessment & Plan: No follow-up since 2023. Reviewed office notes and labs from 2023 per Care Everywhere.  Will repeat PTH and calcium  levels today. Discussed potential referral back over to endocrinology.  Orders: -     PTH,  intact and calcium  -     Magnesium  -     VITAMIN D  25 Hydroxy  (Vit-D Deficiency, Fractures)  Prediabetes Assessment & Plan: Repeat A1C pending.  Work on a healthy diet and regular exercise in order for weight loss, and to reduce the risk of further co-morbidity.   Orders: -     Hemoglobin A1c    Assessment and Plan Assessment & Plan         Comer MARLA Gaskins, NP    History of Present Illness

## 2023-12-19 NOTE — Assessment & Plan Note (Signed)
 Stable.  Refill provided for triamcinolone  0.5% ointment for which she uses sparingly.

## 2023-12-19 NOTE — Assessment & Plan Note (Signed)
 No follow-up since 2023. Reviewed office notes and labs from 2023 per Care Everywhere.  Will repeat PTH and calcium  levels today. Discussed potential referral back over to endocrinology.

## 2023-12-19 NOTE — Patient Instructions (Signed)
 Stop by the lab prior to leaving today. I will notify you of your results once received.   It was a pleasure to see you today!

## 2023-12-19 NOTE — Assessment & Plan Note (Signed)
Repeat lipid panel pending. ? ?Statin intolerant.  ?

## 2023-12-19 NOTE — Assessment & Plan Note (Signed)
 Repeat A1C pending.  Work on a healthy diet and regular exercise in order for weight loss, and to reduce the risk of further co-morbidity.

## 2023-12-19 NOTE — Assessment & Plan Note (Signed)
 Noncompliant to CPAP machine. She understands importance of use.

## 2023-12-19 NOTE — Assessment & Plan Note (Signed)
 Noted on exam today.  Following with cardiology, office notes reviewed from June 2025. Continue diltiazem  to 240 mg daily and warfarin per Coumadin  clinic.

## 2023-12-19 NOTE — Assessment & Plan Note (Signed)
 Controlled.  Continue diltiazem 240 mg daily.

## 2023-12-19 NOTE — Assessment & Plan Note (Signed)
 Waxes and wanes  Continue triamcinolone  0.5% ointment twice daily. Refill provided

## 2023-12-19 NOTE — Assessment & Plan Note (Signed)
 Unclear etiology.  Reviewed side effects from diltiazem  which do report a less than 2% chance of cramping. Could also be side effects from actions of furosemide . Could also be secondary to her sedentary lifestyle.  Checking labs today.

## 2023-12-19 NOTE — Addendum Note (Signed)
 Addended by: Daryel Kenneth K on: 12/19/2023 12:15 PM   Modules accepted: Orders

## 2023-12-20 LAB — PTH, INTACT AND CALCIUM
Calcium: 10.6 mg/dL — ABNORMAL HIGH (ref 8.6–10.4)
PTH: 71 pg/mL (ref 16–77)

## 2023-12-23 MED ORDER — VITAMIN D (ERGOCALCIFEROL) 1.25 MG (50000 UNIT) PO CAPS: ORAL_CAPSULE | ORAL | 0 refills | Status: AC

## 2023-12-25 ENCOUNTER — Telehealth: Payer: Self-pay | Admitting: *Deleted

## 2023-12-25 NOTE — Telephone Encounter (Signed)
 Patient called to report she is taking vitamin d  1.25mg  and advised it is safe to take with warfarin. The medication is also already on her med list.

## 2024-01-14 ENCOUNTER — Other Ambulatory Visit: Payer: Self-pay | Admitting: Cardiology

## 2024-01-16 ENCOUNTER — Ambulatory Visit: Attending: Cardiology

## 2024-01-16 DIAGNOSIS — I4819 Other persistent atrial fibrillation: Secondary | ICD-10-CM | POA: Insufficient documentation

## 2024-01-16 DIAGNOSIS — Z7901 Long term (current) use of anticoagulants: Secondary | ICD-10-CM | POA: Diagnosis present

## 2024-01-16 LAB — POCT INR: INR: 2.7 (ref 2.0–3.0)

## 2024-01-16 NOTE — Progress Notes (Signed)
 INR 2.7 Please see anticoagulation encounter Continue taking warfarin 1 tablet daily except for 1/2 a tablet on Tuesdays and Thursdays.  Recheck INR in 6 weeks.  Coumadin  clinic [phone].

## 2024-01-16 NOTE — Patient Instructions (Signed)
 Continue taking warfarin 1 tablet daily except for 1/2 a tablet on Tuesdays and Thursdays.  Recheck INR in 6 weeks.  Coumadin  clinic [phone].

## 2024-01-31 ENCOUNTER — Ambulatory Visit

## 2024-01-31 ENCOUNTER — Telehealth: Payer: Self-pay | Admitting: Primary Care

## 2024-01-31 NOTE — Telephone Encounter (Signed)
 Copied from CRM [id number]. Topic: General - Other >> Jan 31, 2024  7:55 AM Carlyon D wrote: Reason for CRM: Pt is calling in regards to her AWV appt today, pt states he furnace at home went out and she has people coming to fix it and can not do her AWV appt today at 1050. I am unable to cancel the appt due to it being in an arrived status also tried to get pt rescheduled with new appt.  And unable to do so. Please call pt to get her rescheduled for AWV

## 2024-02-11 ENCOUNTER — Other Ambulatory Visit: Payer: Self-pay | Admitting: Cardiology

## 2024-02-11 DIAGNOSIS — I4891 Unspecified atrial fibrillation: Secondary | ICD-10-CM

## 2024-02-11 DIAGNOSIS — Z7901 Long term (current) use of anticoagulants: Secondary | ICD-10-CM

## 2024-02-27 ENCOUNTER — Ambulatory Visit: Attending: Cardiology

## 2024-02-27 DIAGNOSIS — I4819 Other persistent atrial fibrillation: Secondary | ICD-10-CM | POA: Insufficient documentation

## 2024-02-27 DIAGNOSIS — Z7901 Long term (current) use of anticoagulants: Secondary | ICD-10-CM | POA: Insufficient documentation

## 2024-02-27 LAB — POCT INR: INR: 2.2 (ref 2.0–3.0)

## 2024-02-27 NOTE — Patient Instructions (Signed)
 Continue taking warfarin 1 tablet daily except for 1/2 a tablet on Tuesdays and Thursdays.  Recheck INR in 6 weeks.  Coumadin  clinic [phone].

## 2024-02-27 NOTE — Progress Notes (Signed)
 INR 2.2  Continue taking warfarin 1 tablet daily except for 1/2 a tablet on Tuesdays and Thursdays.  Recheck INR in 6 weeks.  Coumadin  clinic [phone].

## 2024-04-09 ENCOUNTER — Ambulatory Visit

## 2024-12-23 ENCOUNTER — Encounter: Admitting: Primary Care

## 2024-12-23 ENCOUNTER — Ambulatory Visit
